# Patient Record
Sex: Male | Born: 1965 | Race: Black or African American | Hispanic: No | Marital: Single | Smoking: Never smoker
Health system: Southern US, Community
[De-identification: ages and names within clinical notes are randomized; demographics above are authoritative.]

## PROBLEM LIST (undated history)

## (undated) DIAGNOSIS — I472 Ventricular tachycardia: Secondary | ICD-10-CM

## (undated) DIAGNOSIS — E785 Hyperlipidemia, unspecified: Secondary | ICD-10-CM

## (undated) DIAGNOSIS — Z95810 Presence of automatic (implantable) cardiac defibrillator: Secondary | ICD-10-CM

## (undated) DIAGNOSIS — I5022 Chronic systolic (congestive) heart failure: Secondary | ICD-10-CM

## (undated) DIAGNOSIS — I10 Essential (primary) hypertension: Secondary | ICD-10-CM

## (undated) DIAGNOSIS — I420 Dilated cardiomyopathy: Secondary | ICD-10-CM

## (undated) DIAGNOSIS — M1990 Unspecified osteoarthritis, unspecified site: Secondary | ICD-10-CM

## (undated) DIAGNOSIS — I429 Cardiomyopathy, unspecified: Secondary | ICD-10-CM

## (undated) DIAGNOSIS — Z950 Presence of cardiac pacemaker: Secondary | ICD-10-CM

## (undated) HISTORY — DX: Morbid (severe) obesity due to excess calories: E66.01

## (undated) HISTORY — DX: Dilated cardiomyopathy: I42.0

## (undated) HISTORY — DX: Cardiomyopathy, unspecified: I42.9

## (undated) HISTORY — DX: Hyperlipidemia, unspecified: E78.5

## (undated) HISTORY — DX: Essential (primary) hypertension: I10

## (undated) HISTORY — DX: Presence of automatic (implantable) cardiac defibrillator: Z95.810

## (undated) HISTORY — DX: Ventricular tachycardia: I47.2

---

## 2000-04-12 ENCOUNTER — Encounter: Admission: RE | Admit: 2000-04-12 | Discharge: 2000-04-12 | Payer: Self-pay | Admitting: Family Medicine

## 2000-04-12 ENCOUNTER — Encounter: Payer: Self-pay | Admitting: Family Medicine

## 2004-07-25 ENCOUNTER — Emergency Department (HOSPITAL_COMMUNITY): Admission: EM | Admit: 2004-07-25 | Discharge: 2004-07-25 | Payer: Self-pay | Admitting: Family Medicine

## 2004-09-18 ENCOUNTER — Inpatient Hospital Stay (HOSPITAL_COMMUNITY): Admission: AD | Admit: 2004-09-18 | Discharge: 2004-09-22 | Payer: Self-pay | Admitting: Interventional Cardiology

## 2006-07-13 ENCOUNTER — Emergency Department (HOSPITAL_COMMUNITY): Admission: EM | Admit: 2006-07-13 | Discharge: 2006-07-13 | Payer: Self-pay | Admitting: Family Medicine

## 2006-08-21 ENCOUNTER — Emergency Department (HOSPITAL_COMMUNITY): Admission: EM | Admit: 2006-08-21 | Discharge: 2006-08-21 | Payer: Self-pay | Admitting: Emergency Medicine

## 2007-05-26 ENCOUNTER — Emergency Department (HOSPITAL_COMMUNITY): Admission: EM | Admit: 2007-05-26 | Discharge: 2007-05-26 | Payer: Self-pay | Admitting: Emergency Medicine

## 2007-05-30 ENCOUNTER — Encounter (INDEPENDENT_AMBULATORY_CARE_PROVIDER_SITE_OTHER): Payer: Self-pay | Admitting: Internal Medicine

## 2007-09-27 ENCOUNTER — Emergency Department (HOSPITAL_COMMUNITY): Admission: EM | Admit: 2007-09-27 | Discharge: 2007-09-27 | Payer: Self-pay | Admitting: Emergency Medicine

## 2007-12-11 ENCOUNTER — Emergency Department (HOSPITAL_COMMUNITY): Admission: EM | Admit: 2007-12-11 | Discharge: 2007-12-11 | Payer: Self-pay | Admitting: Family Medicine

## 2008-05-29 ENCOUNTER — Ambulatory Visit: Payer: Self-pay | Admitting: Internal Medicine

## 2008-05-29 ENCOUNTER — Encounter (INDEPENDENT_AMBULATORY_CARE_PROVIDER_SITE_OTHER): Payer: Self-pay | Admitting: Internal Medicine

## 2008-05-29 DIAGNOSIS — I428 Other cardiomyopathies: Secondary | ICD-10-CM | POA: Insufficient documentation

## 2008-05-29 DIAGNOSIS — I10 Essential (primary) hypertension: Secondary | ICD-10-CM | POA: Insufficient documentation

## 2008-05-31 LAB — CONVERTED CEMR LAB
Albumin: 4.4 g/dL (ref 3.5–5.2)
BUN: 15 mg/dL (ref 6–23)
CO2: 24 meq/L (ref 19–32)
Chloride: 101 meq/L (ref 96–112)
Cholesterol: 173 mg/dL (ref 0–200)
Glucose, Bld: 98 mg/dL (ref 70–99)
HCT: 44.8 % (ref 39.0–52.0)
Potassium: 4 meq/L (ref 3.5–5.3)
RBC: 5.14 M/uL (ref 4.22–5.81)
RDW: 15.4 % (ref 11.5–15.5)
Sodium: 139 meq/L (ref 135–145)
TSH: 1.593 microintl units/mL (ref 0.350–4.50)
Total Bilirubin: 0.7 mg/dL (ref 0.3–1.2)
Total CHOL/HDL Ratio: 4.4

## 2008-06-10 ENCOUNTER — Telehealth (INDEPENDENT_AMBULATORY_CARE_PROVIDER_SITE_OTHER): Payer: Self-pay | Admitting: *Deleted

## 2008-06-11 ENCOUNTER — Telehealth: Payer: Self-pay | Admitting: Infectious Diseases

## 2009-07-08 ENCOUNTER — Telehealth (INDEPENDENT_AMBULATORY_CARE_PROVIDER_SITE_OTHER): Payer: Self-pay | Admitting: Internal Medicine

## 2009-07-10 ENCOUNTER — Ambulatory Visit: Payer: Self-pay | Admitting: Internal Medicine

## 2009-07-11 DIAGNOSIS — E785 Hyperlipidemia, unspecified: Secondary | ICD-10-CM

## 2009-07-11 LAB — CONVERTED CEMR LAB
ALT: 20 units/L (ref 0–53)
AST: 17 units/L (ref 0–37)
CO2: 29 meq/L (ref 19–32)
Calcium: 9 mg/dL (ref 8.4–10.5)
Chloride: 103 meq/L (ref 96–112)
Cholesterol: 163 mg/dL (ref 0–200)
Creatinine, Ser: 1.19 mg/dL (ref 0.40–1.50)
HDL: 34 mg/dL — ABNORMAL LOW (ref >39)
Hemoglobin: 14.1 g/dL (ref 13.0–17.0)
LDL Cholesterol: 115 mg/dL — ABNORMAL HIGH (ref 0–99)
MCV: 90.4 fL (ref >=78.0)
Platelets: 379 10*3/uL (ref 150–400)
Potassium: 4.4 meq/L (ref 3.5–5.3)
Pro B Natriuretic peptide (BNP): 83 pg/mL (ref 0.0–100.0)
TSH: 1.181 microintl units/mL (ref 0.350–4.5)
Triglycerides: 69 mg/dL (ref <150)

## 2009-07-29 ENCOUNTER — Ambulatory Visit (HOSPITAL_COMMUNITY): Admission: RE | Admit: 2009-07-29 | Discharge: 2009-07-29 | Payer: Self-pay | Admitting: Internal Medicine

## 2009-07-29 ENCOUNTER — Encounter (INDEPENDENT_AMBULATORY_CARE_PROVIDER_SITE_OTHER): Payer: Self-pay | Admitting: Internal Medicine

## 2010-06-23 ENCOUNTER — Telehealth: Payer: Self-pay | Admitting: *Deleted

## 2010-07-14 NOTE — Assessment & Plan Note (Signed)
Summary: EST-CK/FU/MEDS/CFB   Vital Signs:  Patient profile:   45 year old male Height:      71.5 inches (181.61 cm) Weight:      382.5 pounds (173.86 kg) BMI:     52.79 Temp:     98.2 degrees F (36.78 degrees C) oral Pulse rate:   116 / minute BP sitting:   116 / 84  (left arm)  Vitals Entered By: William Kidney Ditzler RN (July 10, 2009 10:56 AM) Is Patient Diabetic? No Pain Assessment Patient in pain? no      Nutritional Status BMI of > 30 = obese Nutritional Status Detail appetite good  Have you ever been in a relationship where you felt threatened, hurt or afraid?denies   Does patient need assistance? Functional Status Self care Ambulation Normal Comments FU - doing well.   History of Present Illness: This is a 45 year old man with past medical history of CHF diagnosed in 2001. He is here for follow up appointment so that he can continue to recieve medications though our office.  He is not been here in about a year.  At last visit we were trying to get records from Dr. Katrinka Blazing, his former cardiologist, to see where he was in his care for CM.  We have not gotten those records.  There is no ECHO in echart.  He has no complaints, no shortness of breath, orthopnea, chest pain, ankle edema or decrease in exercise tolerance.      Depression History:      The patient denies a depressed mood most of the day and a diminished interest in his usual daily activities.         Preventive Screening-Counseling & Management  Alcohol-Tobacco     Alcohol drinks/day: 0     Smoking Status: never  Caffeine-Diet-Exercise     Does Patient Exercise: no  Current Medications (verified): 1)  Furosemide 80 Mg Tabs (Furosemide) .... Take One and One Half Pills Two Times A Day. 2)  Lisinopril 20 Mg Tabs (Lisinopril) .... Take One Tablet Daily.  Allergies: 1)  Bidil (Isosorb Dinitrate-Hydralazine) 2)  Beta Blockers  Review of Systems       per hpi  Physical Exam  General:  alert and  overweight-appearing.   Head:  normocephalic and atraumatic.   Lungs:  normal respiratory effort and normal breath sounds.   Heart:  normal rate, regular rhythm, no murmur, and S3 gallop.   Pulses:  +1 Extremities:  no edema Neurologic:  alert & oriented X3, cranial nerves II-XII intact, strength normal in all extremities, and gait normal.   Psych:  Oriented X3, memory intact for recent and remote, normally interactive, good eye contact, and not anxious appearing.     Impression & Recommendations:  Problem # 1:  CARDIOMYOPATHY, DILATED (ICD-425.4) Not on optimal regimen at all.  Have ordered an ECHO to evaluate current function, especially since he is feeling so well. Will check basic labs. Will decide on referral based on ECHO and labs.  May have some trouble if he really is intolerant of BB and nitrates.  Orders: T-BNP  (B Natriuretic Peptide) ([id number]) T-CBC No Diff ([id number]) T-TSH ([id number]) 2 D Echo (2 D Echo)  Problem # 2:  MORBID OBESITY (ICD-278.01) discussed diet and weight loss.  Orders: T-Comprehensive Metabolic Panel [phone]) T-BNP  (B Natriuretic Peptide) ([id number]) T-Hgb A1C (in-house) ([id number]) T-TSH ([id number])  Problem # 3:  HYPERTENSION NEC (ICD-997.91) BP is great today.  Orders: T-Comprehensive Metabolic Panel [phone])  Complete Medication List: 1)  Furosemide 80 Mg Tabs (Furosemide) .... Take one and one half pills two times a day. 2)  Lisinopril 20 Mg Tabs (Lisinopril) .... Take one tablet daily.  Other Orders: T-Lipid Profile ([id number])  Patient Instructions: 1)  Please schedule a follow-up appointment in 1 month. 2)  You had labwork done today we will call you if there is anything that needs to be addressed before your next appointment. 3)  You need to lose weight. Consider a lower calorie diet and regular exercise.  Try to walk for 30 mintues a day. Process Orders Check Orders Results:     [facility]: ABN not required for this insurance Tests Sent for requisitioning (July 10, 2009 1:40 PM):     07/10/2009: [facility] -- T-Lipid Profile [phone] (signed)     07/10/2009: [facility] -- T-Comprehensive Metabolic Panel [[id number]] (signed)     07/10/2009: [facility] -- T-BNP  (B Natriuretic Peptide) [phone] (signed)     07/10/2009: [facility] -- T-CBC No Diff [[id number]] (signed)     07/10/2009: [facility] -- T-TSH [phone] (signed)    Prevention & Chronic Care Immunizations   Influenza vaccine: Not documented    Tetanus booster: Not documented    Pneumococcal vaccine: Not documented  Other Screening   Smoking status: never  (07/10/2009)  Lipids   Total Cholesterol: 173  (05/29/2008)   Lipid panel action/deferral: Lipid Panel ordered   LDL: 118  (05/29/2008)   LDL Direct: Not documented   HDL: 39  (05/29/2008)   Triglycerides: 82  (05/29/2008)  Laboratory Results   Blood Tests   Date/Time Received: July 10, 2009 12:18 PM Date/Time Reported: Alric Quan  July 10, 2009 12:18 PM   HGBA1C: 6.1%   (Normal Range: Non-Diabetic - 3-6%   Control Diabetic - 6-8%)

## 2010-07-14 NOTE — Progress Notes (Signed)
Summary: refill/gg  Phone Note Refill Request  on July 08, 2009 11:24 AM  Refills Requested: Medication #1:  FUROSEMIDE 80 MG TABS Take one and one half pills two times a day. Pt # [id number]   Method Requested: Electronic Initial call taken by: Merrie Roof RN,  July 08, 2009 11:24 AM  Follow-up for Phone Call        refilled for one month.  He needs to make an appointment to be seen for more refills. Please let him know.    Prescriptions: FUROSEMIDE 80 MG TABS (FUROSEMIDE) Take one and one half pills two times a day.  #64 x 0   Entered and Authorized by:   Elby Showers MD   Signed by:   Elby Showers MD on 07/08/2009   Method used:   Electronically to        [facility] (retail)       [address]       [place], [place]  [address]       Ph: [phone]       Fax: [phone]   RxID:   [id number]   Appended Document: refill/gg Pt informed

## 2010-07-16 NOTE — Progress Notes (Signed)
Summary: refill/gg  Phone Note Refill Request  on June 23, 2010 12:36 PM  Refills Requested: Medication #1:  FUROSEMIDE 80 MG TABS Take one and one half pills two times a day.  Method Requested: Electronic Initial call taken by: Merrie Roof RN,  June 23, 2010 12:36 PM  Follow-up for Phone Call        Refill approved-nurse to complete  One month only.  Long overdue for visit and labs. Follow-up by: Ulyess Mort MD,  June 23, 2010 1:51 PM  Additional Follow-up for Phone Call Additional follow up Details #1::        Rx faxed to pharmacy Pt informed that he needs appointment  within 1 month Additional Follow-up by: Merrie Roof RN,  June 23, 2010 5:14 PM    Prescriptions: FUROSEMIDE 80 MG TABS (FUROSEMIDE) Take one and one half pills two times a day.  #64 x 1   Entered and Authorized by:   Ulyess Mort MD   Signed by:   Ulyess Mort MD on 06/23/2010   Method used:   Electronically to        [facility] (retail)       [address]       [place], [place]  [address]       Ph: [phone]       Fax: [phone]   RxID:   [id number]

## 2010-07-22 ENCOUNTER — Other Ambulatory Visit: Payer: Self-pay | Admitting: *Deleted

## 2010-07-23 MED ORDER — LISINOPRIL 20 MG PO TABS: 20.0000 mg | ORAL_TABLET | Freq: Every day | ORAL | Status: DC

## 2010-07-31 ENCOUNTER — Inpatient Hospital Stay (INDEPENDENT_AMBULATORY_CARE_PROVIDER_SITE_OTHER)
Admission: RE | Admit: 2010-07-31 | Discharge: 2010-07-31 | Disposition: A | Discharge status: Home / Self Care | Payer: Self-pay | Source: Ambulatory Visit | Attending: Family Medicine | Admitting: Family Medicine

## 2010-07-31 ENCOUNTER — Ambulatory Visit (INDEPENDENT_AMBULATORY_CARE_PROVIDER_SITE_OTHER): Payer: Self-pay

## 2010-07-31 ENCOUNTER — Emergency Department (HOSPITAL_COMMUNITY)
Admission: EM | Admit: 2010-07-31 | Discharge: 2010-07-31 | Disposition: A | Discharge status: Home / Self Care | Payer: Self-pay | Attending: Emergency Medicine | Admitting: Emergency Medicine

## 2010-07-31 DIAGNOSIS — M7989 Other specified soft tissue disorders: Secondary | ICD-10-CM

## 2010-07-31 DIAGNOSIS — I10 Essential (primary) hypertension: Secondary | ICD-10-CM | POA: Insufficient documentation

## 2010-07-31 DIAGNOSIS — M79609 Pain in unspecified limb: Secondary | ICD-10-CM | POA: Insufficient documentation

## 2010-07-31 DIAGNOSIS — M25569 Pain in unspecified knee: Secondary | ICD-10-CM | POA: Insufficient documentation

## 2010-07-31 DIAGNOSIS — Z79899 Other long term (current) drug therapy: Secondary | ICD-10-CM | POA: Insufficient documentation

## 2010-07-31 DIAGNOSIS — M25469 Effusion, unspecified knee: Secondary | ICD-10-CM | POA: Insufficient documentation

## 2010-07-31 DIAGNOSIS — I509 Heart failure, unspecified: Secondary | ICD-10-CM | POA: Insufficient documentation

## 2010-07-31 LAB — POCT I-STAT, CHEM 8
BUN: 13 mg/dL (ref 6–23)
Calcium, Ion: 1.07 mmol/L — ABNORMAL LOW (ref 1.12–1.32)
Chloride: 100 mEq/L (ref 96–112)
Creatinine, Ser: 1.3 mg/dL (ref 0.4–1.5)
Glucose, Bld: 99 mg/dL (ref 70–99)
Hemoglobin: 15 g/dL (ref 13.0–17.0)
Sodium: 142 mEq/L (ref 135–145)

## 2010-08-01 ENCOUNTER — Ambulatory Visit (HOSPITAL_COMMUNITY)
Admission: AD | Admit: 2010-08-01 | Discharge: 2010-08-01 | Disposition: A | Discharge status: Home / Self Care | Payer: Self-pay | Source: Ambulatory Visit | Attending: Pediatric Cardiology | Admitting: Pediatric Cardiology

## 2010-08-01 DIAGNOSIS — M7989 Other specified soft tissue disorders: Secondary | ICD-10-CM | POA: Insufficient documentation

## 2010-08-01 DIAGNOSIS — M79609 Pain in unspecified limb: Secondary | ICD-10-CM

## 2010-08-14 ENCOUNTER — Encounter: Payer: Self-pay | Admitting: Internal Medicine

## 2010-08-14 ENCOUNTER — Ambulatory Visit (INDEPENDENT_AMBULATORY_CARE_PROVIDER_SITE_OTHER): Payer: Self-pay | Admitting: Internal Medicine

## 2010-08-14 DIAGNOSIS — M109 Gout, unspecified: Secondary | ICD-10-CM

## 2010-08-14 DIAGNOSIS — E785 Hyperlipidemia, unspecified: Secondary | ICD-10-CM

## 2010-08-14 DIAGNOSIS — E790 Hyperuricemia without signs of inflammatory arthritis and tophaceous disease: Secondary | ICD-10-CM | POA: Insufficient documentation

## 2010-08-14 DIAGNOSIS — I428 Other cardiomyopathies: Secondary | ICD-10-CM

## 2010-08-14 DIAGNOSIS — IMO0002 Reserved for concepts with insufficient information to code with codable children: Secondary | ICD-10-CM

## 2010-08-14 LAB — BASIC METABOLIC PANEL
BUN: 12 mg/dL (ref 6–23)
CO2: 29 mEq/L (ref 19–32)
Calcium: 9.6 mg/dL (ref 8.4–10.5)
Chloride: 102 mEq/L (ref 96–112)
Glucose, Bld: 92 mg/dL (ref 70–99)
Sodium: 142 mEq/L (ref 135–145)

## 2010-08-14 MED ORDER — COLCHICINE 0.6 MG PO TABS: ORAL_TABLET | ORAL | Status: DC

## 2010-08-14 MED ORDER — PRAVASTATIN SODIUM 40 MG PO TABS
40.0000 mg | ORAL_TABLET | Freq: Every day | ORAL | Status: DC
Start: 2010-08-14 — End: 2011-04-30

## 2010-08-14 NOTE — Assessment & Plan Note (Signed)
At goal. Continue current regimen. Check bmet.

## 2010-08-14 NOTE — Assessment & Plan Note (Addendum)
No evidence to suggest septic arthritis. Will start patient on colchicine and check serum uric acid.

## 2010-08-14 NOTE — Progress Notes (Signed)
  Subjective:    Patient ID: William Davila, male    DOB: Dec 12, 1965, 45 y.o.   MRN: [id number]  HPI 45 yr old man with  Past Medical History  Diagnosis Date  . CHF (congestive heart failure)   . Dilated cardiomyopathy 05/2008    2D Echo 2/11: estimated ejection fraction was 20%. Diffuse hypokinesis.  . Hyperlipidemia   . Hypertension   . Morbid obesity     comes to the clinic because left foot pain for about one month duration. Patient reports that symptoms started the day after superbowl. He first noticed that his left knee was swollen. Three days later started to get foot pain. Pain is localized to first toe and anterior aspect of foot. Reports not to have pain on left knee but feels like it has fluid in it, can't bend it.  Aggravated with walking. Relieved slightly motrin. Denies fever/chills. In the past patient reports that he has had monoarticular pain on left and right first toes but pain has never lasted this long before.   Patient hasn't seen Dr. Katrinka Blazing in about 1-2 years because he doesn't have insurance. Does not have a job.    Review of Systems  [all other systems reviewed and are negative       Objective:   Physical Exam  Constitutional: He is oriented to person, place, and time.       obese  HENT:  Mouth/Throat: Oropharynx is clear and moist.  Eyes: Conjunctivae and EOM are normal. Pupils are equal, round, and reactive to light.  Neck: Normal range of motion. Neck supple.  Cardiovascular: Normal rate and regular rhythm.   Murmur heard. Pulmonary/Chest: Effort normal and breath sounds normal.  Abdominal: Soft. Bowel sounds are normal.  Musculoskeletal:       ttp first left toe, no erythema and not warm to touch Left knee minor swelling, ttp medially, no erythema and not warm to touch  Neurological: He is alert and oriented to person, place, and time.  Psychiatric: He has a normal mood and affect.          Assessment & Plan:

## 2010-08-14 NOTE — Assessment & Plan Note (Signed)
Patient was not taking statin. Instructed to restart taking pravastatin as directed. Check FLP and cmet on follow up.

## 2010-08-14 NOTE — Patient Instructions (Signed)
Please make follow up appointment in 2 weeks. Start taking Colchine as directed. Get orange card to go to Cardiologist.

## 2010-08-14 NOTE — Assessment & Plan Note (Signed)
Stable. Instructed patient he need to follow up with Cardiologist to discuss possible AICD with EF<30%. Patient refused referral today. Instructed to get orange card so referral can be make. Will follow up.

## 2010-09-04 ENCOUNTER — Encounter: Payer: Self-pay | Admitting: Internal Medicine

## 2010-09-04 ENCOUNTER — Ambulatory Visit (INDEPENDENT_AMBULATORY_CARE_PROVIDER_SITE_OTHER): Payer: Self-pay | Admitting: Internal Medicine

## 2010-09-04 VITALS — BP 115/82 | HR 63 | Temp 97.3°F | Wt 351.9 lb

## 2010-09-04 DIAGNOSIS — IMO0002 Reserved for concepts with insufficient information to code with codable children: Secondary | ICD-10-CM

## 2010-09-04 DIAGNOSIS — I428 Other cardiomyopathies: Secondary | ICD-10-CM

## 2010-09-04 DIAGNOSIS — M109 Gout, unspecified: Secondary | ICD-10-CM

## 2010-09-04 MED ORDER — PREDNISONE (PAK) 10 MG PO TABS: ORAL_TABLET | ORAL | Status: DC

## 2010-09-04 NOTE — Assessment & Plan Note (Signed)
At goal. Continue current regimen. 

## 2010-09-04 NOTE — Assessment & Plan Note (Signed)
Patient could not afford colchicine. Will prescribe a prednisone taper. Follow up in one month. Instructed to make appointment sooner if needed.

## 2010-09-04 NOTE — Progress Notes (Signed)
  Subjective:    Patient ID: William Davila, male    DOB: February 24, 1966, 45 y.o.   MRN: [id number]  HPI  45 yr old man with  Past Medical History  Diagnosis Date  . CHF (congestive heart failure)   . Dilated cardiomyopathy 05/2008    2D Echo 2/11: estimated ejection fraction was 20%. Diffuse hypokinesis.  . Hyperlipidemia   . Hypertension   . Morbid obesity    comes to the clinic for follow up of left foot pain.  Patient did not take colchicine because of cost of medication. Reports that mother has gout and took some of her ?percocet that decreased inflammation. Reports that  toe pain is improved but ankle pain continues to be experienced. Aggravated by putting weight on ankle. Denies trauma, fever/chills, erythema,    Review of Systems  All other systems reviewed and are negative.       Objective:   Physical Exam  Constitutional: He is oriented to person, place, and time.       obese  HENT:  Mouth/Throat: Oropharynx is clear and moist.  Eyes: Conjunctivae and EOM are normal. Pupils are equal, round, and reactive to light.  Neck: Normal range of motion. Neck supple.  Cardiovascular: Normal rate and regular rhythm.   Murmur heard. Pulmonary/Chest: Effort normal and breath sounds normal.  Abdominal: Soft. Bowel sounds are normal.  Musculoskeletal:       ttp first left toe and ankle, no erythema and not warm to touch Left knee no swelling, ttp medially, no erythema and not warm to touch  Neurological: He is alert and oriented to person, place, and time.  Psychiatric: He has a normal mood and affect.          Assessment & Plan:

## 2010-09-04 NOTE — Patient Instructions (Signed)
Make a follow up appointment in 1 month. Take all medication as directed. 

## 2010-09-04 NOTE — Assessment & Plan Note (Signed)
Stable. No evidence of volume overload. Continue current regimen.

## 2010-09-22 ENCOUNTER — Other Ambulatory Visit: Payer: Self-pay | Admitting: *Deleted

## 2010-09-22 DIAGNOSIS — I420 Dilated cardiomyopathy: Secondary | ICD-10-CM

## 2010-09-24 MED ORDER — FUROSEMIDE 80 MG PO TABS: 120.0000 mg | ORAL_TABLET | Freq: Two times a day (BID) | ORAL | Status: DC

## 2010-10-05 ENCOUNTER — Ambulatory Visit: Payer: Self-pay | Admitting: Internal Medicine

## 2010-10-12 ENCOUNTER — Ambulatory Visit (HOSPITAL_COMMUNITY)
Admission: RE | Admit: 2010-10-12 | Discharge: 2010-10-12 | Disposition: A | Discharge status: Home / Self Care | Payer: Self-pay | Source: Ambulatory Visit | Attending: Internal Medicine | Admitting: Internal Medicine

## 2010-10-12 ENCOUNTER — Encounter: Payer: Self-pay | Admitting: Internal Medicine

## 2010-10-12 ENCOUNTER — Ambulatory Visit (INDEPENDENT_AMBULATORY_CARE_PROVIDER_SITE_OTHER): Payer: Self-pay | Admitting: Internal Medicine

## 2010-10-12 DIAGNOSIS — IMO0002 Reserved for concepts with insufficient information to code with codable children: Secondary | ICD-10-CM

## 2010-10-12 DIAGNOSIS — M25569 Pain in unspecified knee: Secondary | ICD-10-CM | POA: Insufficient documentation

## 2010-10-12 DIAGNOSIS — R609 Edema, unspecified: Secondary | ICD-10-CM | POA: Insufficient documentation

## 2010-10-12 DIAGNOSIS — M109 Gout, unspecified: Secondary | ICD-10-CM | POA: Insufficient documentation

## 2010-10-12 DIAGNOSIS — M2240 Chondromalacia patellae, unspecified knee: Secondary | ICD-10-CM | POA: Insufficient documentation

## 2010-10-12 LAB — URIC ACID: Uric Acid, Serum: 11.6 mg/dL — ABNORMAL HIGH (ref 4.0–7.8)

## 2010-10-12 MED ORDER — DICLOFENAC SODIUM 75 MG PO TBEC: 75.0000 mg | DELAYED_RELEASE_TABLET | Freq: Two times a day (BID) | ORAL | Status: DC

## 2010-10-12 NOTE — Assessment & Plan Note (Signed)
Assessment the patient appears to have improved on prednisone taper, note he is unable to afford colchicine we'll give the patient a prescription for diclofenac, and recheck in the x-ray, uric acid level, and urine GC and Chlamydia to rule out atypical causes of knee pain/swelling. The swelling persists patient may agree to undergo knee arthrocentesis for further diagnostic and management.

## 2010-10-12 NOTE — Progress Notes (Signed)
  Subjective:    Patient ID: William Davila, male    DOB: 1966-03-02, 45 y.o.   MRN: [id number]  HPI  Patient is a 45 year old male with a past medical history listed below, presents to the outpatient clinic for one-month followup after he was seen here for left knee pain which is presumed to be secondary to gout flare patient was treated with a prednisone taper as he was not able to afford colchicine.  Today he returns with improved swelling and pain, however he still complains that he needs crutches to walk around, and residual left knee pain. Patient otherwise feeling well denies any other complaints  Review of Systems  [all other systems reviewed and are negative       Objective:   Physical Exam  [nursing notereviewed. Constitutional: He is oriented to person, place, and time. He appears well-developed and well-nourished.  HENT:  Head: Normocephalic and atraumatic.  Eyes: Pupils are equal, round, and reactive to light.  Neck: Normal range of motion. No JVD present. No thyromegaly present.  Cardiovascular: Normal rate, regular rhythm and normal heart sounds.   Pulmonary/Chest: Effort normal and breath sounds normal. He has no wheezes. He has no rales.  Abdominal: Soft. Bowel sounds are normal. There is no tenderness. There is no rebound.  Musculoskeletal: Normal range of motion. He exhibits no edema.       Left knee: He exhibits swelling. He exhibits no erythema. tenderness found. No lateral joint line, no MCL and no patellar tendon tenderness noted.  Neurological: He is alert and oriented to person, place, and time.  Skin: Skin is warm and dry.          Assessment & Plan:

## 2010-10-12 NOTE — Assessment & Plan Note (Signed)
Well controlled on current treatment, No new changes made today, Will continue to monitor.   

## 2010-10-30 NOTE — Discharge Summary (Signed)
NAMEMENACHEM, URBANEK                ACCOUNT NO.:  [id number]   MEDICAL RECORD NO.:  [id number]          PATIENT TYPE:  INP   LOCATION:  4734                         FACILITY:  [facility]   PHYSICIAN:  Lyn Records, M.D.   DATE OF BIRTH:  13-May-1966   DATE OF ADMISSION:  09/18/2004  DATE OF DISCHARGE:  09/22/2004                                 DISCHARGE SUMMARY   CHIEF COMPLAINT AND REASON FOR ADMISSION:  Mr. Alipio is a 45 year old  morbidly obese gentleman.  History of Class III to IV CHF, currently in a  refractory state. Also has poorly controlled hypertension.  Was diagnosed  with dilated congestive cardiomyopathy in 2001.  Ejection fraction at that  time was 20%.  He has been treated as an outpatient with Lasix, Norvasc and  Toprol and has also had multiple exacerbations of CHF treated as an  outpatient.  The patient was refusing hospitalization. He was significantly  decompensated on September 07, 2004 at that visit and refused hospitalization.  He saw Dr. Katrinka Blazing back on September 14, 2004, again refused admission.  Dr. Katrinka Blazing  has tried increasing diuretic therapy of Lasix from 40 per day to 40 mg  b.i.d., then to 80 mg b.i.d. with no significant improvement in urine output  or resolution of symptoms such as lower extremity edema and dyspnea.  On  followup on the day of admission, his weight was still at 350 pounds.  Because of the patient's unresponsiveness to outpatient diuretics and heart  failure therapy, the patient has finally agreed to come into the hospital  on September 18, 2004 for treatment for heart failure.   HOSPITAL COURSE:  The patient was admitted on September 18, 2004 by Dr. Katrinka Blazing.  He was started on aggressive IV Lasix diuresis.  Electrolyte panel was  followed and telemetry was initiated to make sure the patient had no  arrhythmic issues.  The patient diuresed throughout the next several days.  By three days later, his feet were still swollen.  He was still short of  breath.  He  still had an S3 gallop.  BUN and creatinine were stable at 13  and 1.3.  Potassium was stable at 4.7.  His weight had decreased from 342  pounds on admission to 331 pounds that day.  He was seemed appropriate to  change from IV to p.o. Lasix on September 21, 2004.  Low dose beta blocker  therapy was recommended.  On September 22, 2004 the patient was down to 330.6  pounds.  This was a total of 11 pounds off since admission.  His creatinine  was stable at 1.3, potassium 4.3.  He was not complaining of any shortness  of breath or dizziness when standing.  His blood pressure was 115/89 with a  heart rate of 111.  He was clinically compensated from the heart failure  standpoint.  Therefore, we continued his Aldactone and Lasix with a new dose  of 120 mg b.i.d.  We started low dose Coreg to help with decreased EF as  well as beta blockade since the patient was having resting  tachycardia and  he was deemed appropriate for discharge once a 2-D echocardiogram was done.  Dr. Katrinka Blazing plans to start BiDil as an outpatient.  In addition, the weight  that had initially been listed for September 22, 2004 was actually incorrectly  put into the computer for September 21, 2004 with a repeat weight for September 22, 2004 was 327.3 pounds.  So this makes the patient down about 14 pounds since  admission.  I have reviewed the chart and medical records, and I am unable  to locate the echocardiogram that was recommended by Dr. Katrinka Blazing to be done.   DISCHARGE DIAGNOSES:  1.  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  2.  Hypertension, currently well controlled.  3.  Hypokalemia.  4.  Prior history of alcohol abuse.   DISCHARGE MEDICATIONS:  1.  Coreg 3.125 mg b.i.d.  This is new.  2.  Norvasc 10 mg daily.  3.  Aldactone 25 mg b.i.d. This is new.  4.  Lasix 120 mg b.i.d.  The patient has been instructed to take 1-1/2 of      his 80 mg tablets twice a  day.  This is a new dose.  5.  Klor-Con 20 mEq per packet.  He is to take one packet twice daily in      water or juice.  The patient was complaining of difficulty swallowing      the tablets.  6.  Plans to start BiDil as an outpatient.   DIET AND ACTIVITY:  Per CHF discharge instructions provided by [facility].  This includes recommendations about salt-limited diet, recommendations to  weigh daily and to call the doctor for any weight gain.   FOLLOW UP:  He is to have a BMET at [facility] on September 29, 2004.  He  is to have a blood pressure check on the same date at 11 a.m.  He is to  follow up with Dr. Katrinka Blazing on Oct 12, 2004 at 3:30 p.m.       ALE/MEDQ  D:  11/18/2004  T:  11/18/2004  Job:  [id number]   cc:   L. Lupe Carney, M.D.  [address] [place]  [place] [address]  Fax: [phone]

## 2010-11-12 ENCOUNTER — Encounter: Payer: Self-pay | Admitting: Internal Medicine

## 2010-12-10 ENCOUNTER — Encounter: Payer: Self-pay | Admitting: Internal Medicine

## 2010-12-15 ENCOUNTER — Encounter: Payer: Self-pay | Admitting: Internal Medicine

## 2010-12-15 ENCOUNTER — Ambulatory Visit (INDEPENDENT_AMBULATORY_CARE_PROVIDER_SITE_OTHER): Payer: Self-pay | Admitting: Internal Medicine

## 2010-12-15 VITALS — BP 120/90 | HR 121 | Temp 97.6°F | Ht 70.0 in | Wt 342.2 lb

## 2010-12-15 DIAGNOSIS — M25569 Pain in unspecified knee: Secondary | ICD-10-CM

## 2010-12-15 DIAGNOSIS — M25562 Pain in left knee: Secondary | ICD-10-CM | POA: Insufficient documentation

## 2010-12-15 NOTE — Progress Notes (Signed)
  Subjective:    Patient ID: William Davila, male    DOB: July 02, 1965, 45 y.o.   MRN: [id number]  HPI 45 yo M here for f/u for L knee pain.  Was seen last in clinic in April with L knee pain, dx as possible gout flare.  Knee was swollen and painful, so prednisone taper was rx'ed.  Pt reports that swelling did decrease but that pain has persisted.  He has been using crutches for mobility.  He denies any further effusion.  He denies trauma to the area.  Prior PMD appt's thought it could be a gout flare, so a uric acid level, Xray, and gc/ct urine tests were ordered.  X ray sig for chondromalacia patella w no acute findings.  Uric acid was elevated, gc/ct negative.   Review of Systems     Objective:   Physical Exam  Musculoskeletal:       Left knee: He exhibits decreased range of motion and deformity. He exhibits no erythema, normal alignment and no LCL laxity. no tenderness found. No medial joint line, no lateral joint line, no MCL and no LCL tenderness noted.       Exam significant for firm swelling of suprapatellar area with concurrent depression more proximally.  Visually apparent and palpable.  Pt cannot fully extend knee.  During ambulation, pt has hyperextension of the knee.  Negative for ACL/PCL/MCL/LCL laxity.  No joint line tenderness.  No effusion.  Mobile patella.  Neg mcmurray.          Assessment & Plan:

## 2010-12-15 NOTE — Assessment & Plan Note (Addendum)
Given this pt's continued L knee pain and physical exam findings, there is some concern for quadriceps tendon pathology.  Could also be patellar chondromalacia as x ray indicates, but pt limited ROM is impressive enough to warrant further evaluation.  Will refer to sports medicine for u/s and possible arthrocentesis. --referral to sports medicine. --pt needs to fill out orange card paperwork, which was stressed so as not to have large bill --referral to Lupita Leash T for information regarding possible disability.  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

## 2010-12-15 NOTE — Patient Instructions (Signed)
Please complete paperwork for orange card.  Please expect call from Sports medicine to make appointment.  Please expect call from Dorothe Pea regarding disability.

## 2010-12-16 NOTE — Progress Notes (Signed)
I saw and discussed William Davila with Dr Abner Greenspan and agree with his note as outlined here.

## 2010-12-21 ENCOUNTER — Telehealth: Payer: Self-pay | Admitting: Licensed Clinical Social Worker

## 2010-12-21 NOTE — Telephone Encounter (Signed)
Soc. Work.  30 minutes.  Tel consult with patient regarding work vs. Disability.  Patient has hx of CHF, gout, left knee issues, and morbid obesity.   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   Patient lives with his grandparents. He thinks he could work but feels like no one will hire him.  His latest problem is a hyper extended left knee.  I explained to him how the Disability process works and the difficulties of going that route especially when he is only 45 years old and would like to work.  He does have CHF and so that might be taken into consideration.   I've educated him about [employer] and how they will evaluate him especially since he would like to go back to work and does have some education.   He has my phone number should that referral to [facility] not work out. He will call there and advise me if he has difficulties connecting there.  The patient is in the process of getting his paperwork together for financial eligibility with [facility].  I've also educated him about the [facility] for other needed meds.   SW as needed.

## 2011-02-02 ENCOUNTER — Encounter: Payer: Self-pay | Admitting: Family Medicine

## 2011-02-02 ENCOUNTER — Ambulatory Visit (INDEPENDENT_AMBULATORY_CARE_PROVIDER_SITE_OTHER): Payer: Self-pay | Admitting: Family Medicine

## 2011-02-02 VITALS — BP 110/76 | HR 76

## 2011-02-02 DIAGNOSIS — IMO0002 Reserved for concepts with insufficient information to code with codable children: Secondary | ICD-10-CM

## 2011-02-02 DIAGNOSIS — S76119A Strain of unspecified quadriceps muscle, fascia and tendon, initial encounter: Secondary | ICD-10-CM | POA: Insufficient documentation

## 2011-02-02 NOTE — Patient Instructions (Signed)
You have a tear in your quadriceps tendon. Very important to start physical therapy for rehab. Make an appointment in 6 weeks to follow up.  Quadriceps Tendon Tear/Disruption with Rehab   The quadriceps muscles are located on the front of the thigh and are responsible for straightening the knee and bending the hip. The quadriceps tendon connects these muscles to the  kneecap (patella) and also from the patella to a portion of the shin bone (tibial tubercle). A quadriceps tendon tear or disruption is characterized by a partial or complete tear of the quadriceps tendon between the quadriceps muscles and the patella. Quadriceps tendon tears or disruptions often cause pain above the knee and result in a decrease in function of the quadriceps muscles.    SYMPTOMS  A "pop" or tear felt or heard above the patella at the time of injury.  Pain, tenderness, inflammation, and/or bruising over the quadriceps tendon.   Pain that worsens with use of the quadriceps muscles.  Difficulty with common tasks that involve the quadriceps muscle, such as walking.  A crackling sound (crepitation) when the tendon is moved or touched.  Loss of fullness of the muscle or bulging within the area of muscle with complete rupture.   CAUSES  A strain occurs when a force is placed on the muscle or tendon that is greater than it can withstand. Common mechanisms of injury include:  Repetitive strenuous use of the quadriceps muscles. This may be due to an increase in the intensity, frequency, or duration of exercise.  Direct trauma to the quadriceps muscles or tendons.   RISK INCREASES WITH   Activities that involve forceful contractions of the quadriceps muscles (jumping or sprinting).  Contact sports (soccer or football).  Poor strength and flexibility.  Failure to warm-up properly before activity.  Previous injury to the thigh or knee.  Untreated quadriceps tendinitis.  Corticosteroid injections into the  quadriceps tendon.   PREVENTIVE MEASURES   Warm up and stretch properly before activity.  Allow for adequate recovery between workouts.  Maintain physical fitness: l Strength, flexibility, and endurance. l Cardiovascular fitness.  Wear properly fitted and padded protective equipment.   PROGNOSIS If treated properly, then recovery from a quadriceps tendon tear or disruption usually occurs; however the recovery period may b 6 to 9 months.    POSSIBLE COMPLICATIONS   Quadriceps muscle weakness.  Re-rupture of the tendon after treatment.  Prolonged healing time, if improperly treated or re-injured.  Risks of surgery: infection, bleeding, nerve damage, or damage to surrounding tissues.   GENERAL TREATMENT CONSIDERATIONS  Initial treatment involves rest from any activities that aggravate the symptoms. Ice, medication, and elevation may be used to help reduce pain and inflammation. The use of strengthening and stretching exercises may help reduce pain with activity. These exercises may be performed at home or with referral to a therapist. If the tear is complete, then surgery is usually required to repair the tendon, as it cannot heal on its own. After surgery immobilization is required to allow for healing. After immobilization it is important to perform strengthening and stretching exercises to help regain strength and a full range of motion.    MEDICATION   If pain medication is necessary, then nonsteroidal anti-inflammatory medications, such as aspirin and ibuprofen, or other minor pain relievers, such as acetaminophen, are often recommended.   Do not take pain medication for 7 days before surgery.   Prescription pain relievers may be given if deemed necessary by your caregiver. Use only  as directed and only as much as you need.   COLD THERAPY   Cold treatment (icing) relieves pain and reduces inflammation. Cold treatment should be applied for 10 to 15 minutes every 2 to 3 hours  for inflammation and pain and immediately after any activity that aggravates your symptoms. Use ice packs or massage the area with a piece of ice (ice massage).   SEEK TREATMENT IF:  Treatment seems to offer no benefit, or the condition worsens.  Any medications produce adverse side effects.  Any complications from surgery occur: l Pain, numbness, or coldness in the extremity operated upon. l Discoloration of the nail beds (they become blue or gray) of the extremity operated upon. l Signs of infections (fever, pain, inflammation, redness, or persistent bleeding).      EXERCISES   RANGE OF MOTION AND STRETCHING EXERCISES - Quadriceps Tendon Tear/Disruption These exercises may help you when beginning to rehabilitate your injury. Your symptoms may resolve with or without further involvement from your physician, physical therapist or athletic trainer. While completing these exercises, remember:     Restoring tissue flexibility helps normal motion to return to the joints.This allows healthier, less painful movement and activity.  An effective stretch should be held for at least 30 seconds.  A stretch should never be painful. You should only feel a gentle lengthening or release in the stretched tissue.      RANGE OF MOTION - Knee Flexion and Extension, Active-Assisted  Sit on the edge of a table or chair with your thighs firmly supported.  It may be helpful to place a folded towel under the end of your __________ thigh.   Flexion (bending): Place the ankle of your healthy leg on top of the other ankle. Use your healthy leg to gently bend your __________ knee until you feel a mild tension across the top of your knee.   Hold for __________ seconds.   Extension (straightening): Switch your ankles so your __________ leg is on top. Use your healthy leg to straighten your __________ knee until you feel a mild tension on the backside of your knee.  Hold for __________ seconds. Repeat  __________ times. Complete __________ times per day.     RANGE OF MOTION - Knee Flexion, Active  Lie on your back with both knees straight. (If this causes back discomfort, bend your opposite knee, placing your foot flat on the floor.)  Slowly slide your heel back toward your buttocks until you feel a gentle stretch in the front of your knee or thigh.   Hold for __________ seconds. Slowly slide your heel back to the starting position. Repeat __________ times. Complete this exercise __________ times per day.      STRETCH - Knee Flexion, Supine  Lie on the floor with your __________ heel/foot lightly touching the wall (place both feet on the wall if you do not use a door frame).  Without using any effort, allow gravity to slide your foot down the wall slowly until you feel a gentle stretch in the front of your __________ knee.    Hold this stretch for __________ seconds. Then return the leg to the starting position, using your health leg for help, if needed.  Repeat __________ times. Complete this stretch __________ times per day.      STRETCH - Quadriceps, Prone   Lie on your stomach on a firm surface, such as a bed or padded floor.  Bend your __________ knee and grasp your ankle. If you are  unable to reach, your ankle or pant leg, use a belt around your foot to lengthen your reach.  Gently pull your heel toward your buttocks. Your knee should not slide out to the side. You should feel a stretch in the front of your thigh and/or knee. Hold this position for __________ seconds.   Repeat __________ times. Complete this stretch __________ times per day.      STRENGTHENING EXERCISES - Quadriceps Tendon Tear/Disruption These exercises may help you when beginning to rehabilitate your injury. They may resolve your symptoms with or without further involvement from your physician, physical therapist or athletic trainer. While completing these exercises, remember:   Muscles can gain both  the endurance and the strength needed for everyday activities through controlled exercises.  Complete these exercises as instructed by your physician, physical therapist or athletic trainer. Progress the resistance and repetitions only as guided.     STRENGTH - Quadriceps, Isometrics  Lie on your back with your __________ leg extended and your opposite knee bent.  Gradually tense the muscles in the front of your __________ thigh. You should see either your knee cap slide up toward your hip or increased dimpling just above the knee.  This motion will push the back of the knee down toward the floor/mat/bed on which you are lying.    Hold the muscle as tight as you can without increasing your pain for __________ seconds.  Relax the muscles slowly and completely in between each repetition. Repeat __________ times. Complete this exercise __________ times per day.      STRENGTH - Quadriceps, Short Arcs   Lie on your back. Place a __________ inch towel roll under your knee so that the knee slightly bends.  Raise only your lower leg by tightening the muscles in the front of your thigh. Do not allow your thigh to rise.  Hold this position for __________ seconds. Repeat __________ times. Complete this exercise __________ times per day.      OPTIONAL ANKLE WEIGHTS:  Begin with __________ pounds, but DO NOT exceed ____________________ pounds.  Increase in 1 pound increments.       STRENGTH - Quadriceps, Straight Leg Raises    Quality counts! Watch for signs that the quadriceps muscle is working to insure you are strengthening the correct muscles and not "cheating" by substituting with healthier muscles.  Lay on your back with your __________ leg extended and your opposite knee bent.    Tense the muscles in the front of your __________ thigh. You should see either your knee cap slide up or increased dimpling just above the knee. Your thigh may even quiver.  Tighten these muscles even more  and raise your leg 4 to 6 inches off the floor. Hold for __________ seconds.  Keeping these muscles tense, lower your leg.  Relax the muscles slowly and completely in between each repetition. Repeat __________ times. Complete this exercise __________ times per day.     STRENGTH - Quadriceps, Step-Ups   Use a thick book, step or step stool that is __________ inches tall.  Holding a wall or counter for balance only, not support.  Slowly step-up with your __________ foot, keeping your knee in line with your hip and foot. Do not allow your knee to bend so far that you cannot see your toes.  Slowly unlock your knee and lower yourself to the starting position.  Your muscles, not gravity, should lower you. Repeat __________ times. Complete this exercise __________ times per day.  STRENGTH - Quadriceps, Wall Slides    Follow guidelines for form closely.  Increased knee pain often results from poorly placed feet or knees.  Lean against a smooth wall or door and walk your feet out 18-24 inches.  Place your feet hip-width apart.  Slowly slide down the wall or door until your knees bend __________ degrees.*  Keep your knees over your heels, not your toes, and in line with your hips, not falling to either side.  Hold for __________ seconds. Stand up to rest for __________ seconds in between each repetition.  Repeat __________ times. Complete this exercise __________ times per day.   * Your physician, physical therapist or athletic trainer will alter this angle based on your symptoms and progress.   Document Released: 05/31/2005  Document Re-Released: 06/22/2009 [facility] Patient Information 2011 [place], [place].

## 2011-02-02 NOTE — Progress Notes (Signed)
  Subjective:    Patient ID: William Davila, male    DOB: 10/12/1965, 45 y.o.   MRN: [id number]  HPI  Left knee problems. He noticed on the day of the [employer] Bowl day he started having difficulty straightening his lower leg all the way out. He does not recall a specific trauma. He was doing a lot of jumping around on that day at the [employer]. The next morning he woke with knee pain, a lot of the swelling and inability to straighten his leg. The swelling has improved since then. It took about a month. His pain has not improved. He's been walking on crutches most of the time since then. He denies prior injury to either knee. He has not any surgeries on his means. He denies any tingling or numbness in his lower extremities. He does feel like the left knee "bends backward" when he stands up. It has not given way on him but feels unstable.  Review of Systems    denies any recent change in his weight. Denies fever. Objective:   Physical Exam  GENERAL: Well-developed obese male no acute distress KNEES: bilaterally he has slightly laterally pointing patella but they are located well. He does have some lateralization of the tracking. The left patella is nontender to grind test. Exam is somewhat difficult secondary to habitus but he appears to be ligamentously intact to varus and valgus stress. I cannot perform Lockman on him but I did do an anterior drawer which seemed to have a good endpoint. He also had a good endpoint for the posterior cruciate ligament. The quadricep muscle bulk is less on the left than the right. Neither quadricep muscles is very well-defined. McMurray's test is negative.  ULTRASOUND: Small tear in the quadricep tendon where it intersects with the VMO. There is not any increased Doppler vascular activity noted in this area. The rest of the quadriceps and patellar tendons are intact. There is very slight suprapatellar pouch fluid collection consistent with small knee effusion. The  menisci are not well seen.      Assessment & Plan:  Small quadricep muscle/tendon tear. Noted that he has cardiomyopathy on his problem list, and he is obese I don't think a nitroglycerin patch would be appropriate for him. We'll place him in physical therapy and see him back in 4 weeks. He can continue using crutches when necessary.

## 2011-02-10 ENCOUNTER — Ambulatory Visit: Payer: Self-pay | Attending: Family Medicine | Admitting: Physical Therapy

## 2011-02-10 DIAGNOSIS — R262 Difficulty in walking, not elsewhere classified: Secondary | ICD-10-CM | POA: Insufficient documentation

## 2011-02-10 DIAGNOSIS — IMO0001 Reserved for inherently not codable concepts without codable children: Secondary | ICD-10-CM | POA: Insufficient documentation

## 2011-02-17 ENCOUNTER — Encounter: Payer: Self-pay | Admitting: Physical Therapy

## 2011-02-22 ENCOUNTER — Ambulatory Visit (INDEPENDENT_AMBULATORY_CARE_PROVIDER_SITE_OTHER): Payer: Self-pay

## 2011-02-22 ENCOUNTER — Inpatient Hospital Stay (INDEPENDENT_AMBULATORY_CARE_PROVIDER_SITE_OTHER)
Admission: RE | Admit: 2011-02-22 | Discharge: 2011-02-22 | Disposition: A | Discharge status: Home / Self Care | Payer: Self-pay | Source: Ambulatory Visit | Attending: Family Medicine | Admitting: Family Medicine

## 2011-02-22 ENCOUNTER — Encounter: Payer: Self-pay | Admitting: Physical Therapy

## 2011-02-22 DIAGNOSIS — S0990XA Unspecified injury of head, initial encounter: Secondary | ICD-10-CM

## 2011-02-22 DIAGNOSIS — M25469 Effusion, unspecified knee: Secondary | ICD-10-CM

## 2011-02-22 DIAGNOSIS — S0100XA Unspecified open wound of scalp, initial encounter: Secondary | ICD-10-CM

## 2011-02-22 DIAGNOSIS — M79609 Pain in unspecified limb: Secondary | ICD-10-CM

## 2011-02-22 LAB — POCT I-STAT, CHEM 8
Chloride: 102 mEq/L (ref 96–112)
Creatinine, Ser: 1.2 mg/dL (ref 0.50–1.35)
Hemoglobin: 17.7 g/dL — ABNORMAL HIGH (ref 13.0–17.0)
Sodium: 140 mEq/L (ref 135–145)
TCO2: 25 mmol/L (ref 0–100)

## 2011-02-24 ENCOUNTER — Encounter: Payer: Self-pay | Admitting: Physical Therapy

## 2011-03-02 ENCOUNTER — Encounter: Payer: Self-pay | Admitting: Physical Therapy

## 2011-03-04 ENCOUNTER — Encounter: Payer: Self-pay | Admitting: Physical Therapy

## 2011-03-17 ENCOUNTER — Telehealth: Payer: Self-pay | Admitting: *Deleted

## 2011-03-17 NOTE — Telephone Encounter (Signed)
Pt's mother calls to say pt is getting worse and cannot walk, having pain and wants to be seen in clinic, she states this is ongoing. Suggested urg care/ ED, declines, appt given for tues 10/9 at 1015

## 2011-03-19 ENCOUNTER — Ambulatory Visit: Payer: Self-pay | Admitting: Family Medicine

## 2011-03-22 LAB — I-STAT 8, (EC8 V) (CONVERTED LAB)
Bicarbonate: 23.4
Sodium: 137
pCO2, Ven: 39 — ABNORMAL LOW

## 2011-03-22 LAB — POCT I-STAT CREATININE
Creatinine, Ser: 1.5
Operator id: 116391

## 2011-03-22 LAB — B-NATRIURETIC PEPTIDE (CONVERTED LAB): Pro B Natriuretic peptide (BNP): 44

## 2011-03-23 ENCOUNTER — Ambulatory Visit: Payer: Self-pay | Admitting: Internal Medicine

## 2011-04-05 ENCOUNTER — Other Ambulatory Visit: Payer: Self-pay | Admitting: *Deleted

## 2011-04-05 MED ORDER — LISINOPRIL 20 MG PO TABS: 20.0000 mg | ORAL_TABLET | Freq: Every day | ORAL | Status: DC

## 2011-04-27 ENCOUNTER — Encounter: Payer: Self-pay | Admitting: Internal Medicine

## 2011-04-27 ENCOUNTER — Ambulatory Visit (HOSPITAL_COMMUNITY)
Admission: RE | Admit: 2011-04-27 | Discharge: 2011-04-27 | Disposition: A | Discharge status: Home / Self Care | Payer: Medicaid Other | Source: Ambulatory Visit | Attending: Internal Medicine | Admitting: Internal Medicine

## 2011-04-27 ENCOUNTER — Ambulatory Visit (INDEPENDENT_AMBULATORY_CARE_PROVIDER_SITE_OTHER): Payer: Self-pay | Admitting: Internal Medicine

## 2011-04-27 VITALS — BP 115/86 | HR 56 | Temp 97.0°F | Ht 70.5 in

## 2011-04-27 DIAGNOSIS — S76119A Strain of unspecified quadriceps muscle, fascia and tendon, initial encounter: Secondary | ICD-10-CM

## 2011-04-27 DIAGNOSIS — R9431 Abnormal electrocardiogram [ECG] [EKG]: Secondary | ICD-10-CM | POA: Insufficient documentation

## 2011-04-27 DIAGNOSIS — IMO0002 Reserved for concepts with insufficient information to code with codable children: Secondary | ICD-10-CM

## 2011-04-27 DIAGNOSIS — I428 Other cardiomyopathies: Secondary | ICD-10-CM

## 2011-04-27 DIAGNOSIS — I420 Dilated cardiomyopathy: Secondary | ICD-10-CM

## 2011-04-27 DIAGNOSIS — S86819A Strain of other muscle(s) and tendon(s) at lower leg level, unspecified leg, initial encounter: Secondary | ICD-10-CM

## 2011-04-27 DIAGNOSIS — M7989 Other specified soft tissue disorders: Secondary | ICD-10-CM | POA: Diagnosis not present

## 2011-04-27 LAB — BASIC METABOLIC PANEL
BUN: 12 mg/dL (ref 6–23)
CO2: 27 mEq/L (ref 19–32)
Calcium: 9.6 mg/dL (ref 8.4–10.5)
Chloride: 101 mEq/L (ref 96–112)
Creat: 0.97 mg/dL (ref 0.50–1.35)
Glucose, Bld: 93 mg/dL (ref 70–99)
Potassium: 3.8 mEq/L (ref 3.5–5.3)
Sodium: 139 mEq/L (ref 135–145)

## 2011-04-27 LAB — CBC
HCT: 44.4 % (ref 39.0–52.0)
Hemoglobin: 15.3 g/dL (ref 13.0–17.0)
MCH: 28.1 pg (ref 26.0–34.0)
MCHC: 34.5 g/dL (ref 30.0–36.0)
Platelets: 580 10*3/uL — ABNORMAL HIGH (ref 150–400)
RBC: 5.44 MIL/uL (ref 4.22–5.81)
RDW: 16.8 % — ABNORMAL HIGH (ref 11.5–15.5)
WBC: 10.7 10*3/uL — ABNORMAL HIGH (ref 4.0–10.5)

## 2011-04-27 MED ORDER — HYDROCODONE-ACETAMINOPHEN 5-500 MG PO TABS: 2.0000 | ORAL_TABLET | Freq: Four times a day (QID) | ORAL | Status: DC | PRN

## 2011-04-27 MED ORDER — FUROSEMIDE 80 MG PO TABS: 120.0000 mg | ORAL_TABLET | Freq: Two times a day (BID) | ORAL | Status: DC

## 2011-04-27 MED ORDER — POTASSIUM CHLORIDE ER 10 MEQ PO TBCR: 20.0000 meq | EXTENDED_RELEASE_TABLET | Freq: Two times a day (BID) | ORAL | Status: DC

## 2011-04-27 NOTE — Assessment & Plan Note (Addendum)
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 Surprisingly, his lungs sound clear, which makes me think his leg edema is a combination of sedentary behavior, venous stasis, and cardiomyopathy. He is also beginning to get contractures of the bilateral legs from not moving very much. This is very concerning, and we will be following this patient closely. The plan is to aggressively diuresis him and have him follow back up on Friday. We will also urgently refer him to cardiology, and get an echo. He fell out of contact with cardiology and was last seen in 2006. 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 Last EF was 20% in February of 2011 with global hypokinesis. - Lasix 120 mg twice a day - Potassium replacement 20 mEq twice a day - Followup on Friday to assess leg edema - Cardiology referral - Echocardiogram - EKG - CBC, Bmet, TSH - If this patient continues to have extensive edema on Friday, he should be reassessed and perhaps his Lasix dose should be raised further. I don't think he needs admission today, but he may be headed in that direction if he continues to decline. Depends on what his echo shows. Hopefully he can continue to be managed as an outpatient, as long as cardiology can assess him. - Can consider compression stockings, but that may not solve his problem - The patient's blood pressure and heart rate did not support addition of the beta blocker at this time, and he is on lisinopril at the moment.

## 2011-04-27 NOTE — Progress Notes (Signed)
Mother and pt aware of appt [facility] 04/29/11 2PM 2D echo. Stanton Kidney Dianara Smullen RN 04/27/11 4:30pm

## 2011-04-27 NOTE — Assessment & Plan Note (Signed)
Patient was diagnosed over the summer with partial rupture of his quadriceps, and went to physical therapy only one time. Therapy is difficult for this patient secondary to contralateral osteoarthritis, bilateral leg edema, and continued pain. All the patient does complain of leg pain in this area, I explained to him that his bilateral leg pain is most likely due to fluid overload rather than this small partial tear. - Vicodin for pain control - Physical therapy once fluid overload resolves

## 2011-04-27 NOTE — Progress Notes (Signed)
  Subjective:    Patient ID: William Davila, male    DOB: 08-18-1965, 45 y.o.   MRN: [id number]  HPI Patient was last seen in July, when I referred him to sports medicine to confirm the diagnosis of left quadriceps tendon rupture. They did in fact confirm the diagnosis of a small, partial tear, and referred the patient to physical therapy. The patient went to one therapy session before his right knee (contralateral) began giving him difficulty. At the time he was using crutches to ambulate, but since then he is become more immobile and is now wheelchair-bound.  Patient presents today with bilateral leg edema, pain, and difficulty ambulating. He denies any shortness of breath, orthopnea, or PND, and says his leg swelling has been progressive for 2 weeks. His medial left knee continues to bother him, which is the location of his orthopedic injury.   Review of Systems     Objective:   Physical Exam Physical Exam GEN: NAD.  Alert and oriented x 3.  Pleasant, conversant, and cooperative to exam. RESP:  CTAB, no w/r/r CARDIOVASCULAR: RRR, S1, S2, no m/r/g ABDOMEN: soft, NT/ND, NABS EXT: warm and dry. Extensive bilateral edema up to the knee        Assessment & Plan:

## 2011-04-28 NOTE — Progress Notes (Signed)
I discussed [name] with Dr Abner Greenspan and agree with his A/P. Pt's concern is his orthopedic injury but as Dr Abner Greenspan documented his cardiovascular issues are most pressing. LE edema but clear lungs would be concerning for R sided heart failure so an ECHO is being obtained. Since he does have a h/o sig decreased EF, aggressive diuresis is being started along with K supplementation and very close F/U. The cause of the decreased EF is unknown as no ischemic W/U has been done - ? H/O ETOH use, no documentation of cocaine use. Could it be tachy induced LV failure? His EKG is weird, LAD, prolonged Qtc, wide QRS, no p waves. Dr Clarene Duke read it as Afib with RVR. Appt in two days with Dr Eben Burow and has cards F/U.

## 2011-04-29 ENCOUNTER — Telehealth: Payer: Self-pay | Admitting: Licensed Clinical Social Worker

## 2011-04-29 ENCOUNTER — Ambulatory Visit (HOSPITAL_COMMUNITY)
Admission: RE | Admit: 2011-04-29 | Discharge: 2011-04-29 | Disposition: A | Discharge status: Home / Self Care | Payer: Medicaid Other | Source: Ambulatory Visit | Attending: Internal Medicine | Admitting: Internal Medicine

## 2011-04-29 DIAGNOSIS — I420 Dilated cardiomyopathy: Secondary | ICD-10-CM

## 2011-04-29 DIAGNOSIS — I428 Other cardiomyopathies: Secondary | ICD-10-CM | POA: Diagnosis present

## 2011-04-29 NOTE — Progress Notes (Signed)
*  PRELIMINARY RESULTS* Echocardiogram 2D Echocardiogram has been performed.  William Davila [facility] 04/29/2011, 3:06 PM

## 2011-04-29 NOTE — Telephone Encounter (Signed)
Called William Davila to make sure he understood and completed everything to start disability process.   Release for medical records was signed and witnessed and advised him that [employer] will request all his medical records.  William Davila will also send in his birth certificate.  His sister helped him with Adult Disability Report online.    William Davila to let us know when the decision comes in so he can file reconsideration.  William Davila he many want to get an attorney if it goes to appeals phase.   William Davila expressed understanding.  He is picking up his paperwork today to mail everything back to [employer].

## 2011-04-30 ENCOUNTER — Inpatient Hospital Stay (HOSPITAL_COMMUNITY)
Admission: AD | Admit: 2011-04-30 | Discharge: 2011-05-08 | DRG: 291 | Disposition: A | Discharge status: Home / Self Care | Payer: Medicaid Other | Source: Ambulatory Visit | Attending: Internal Medicine | Admitting: Internal Medicine

## 2011-04-30 ENCOUNTER — Other Ambulatory Visit: Payer: Self-pay

## 2011-04-30 ENCOUNTER — Ambulatory Visit (HOSPITAL_COMMUNITY)
Admission: RE | Admit: 2011-04-30 | Discharge: 2011-04-30 | Disposition: A | Discharge status: Home / Self Care | Payer: Medicaid Other | Source: Ambulatory Visit | Attending: Internal Medicine | Admitting: Internal Medicine

## 2011-04-30 ENCOUNTER — Encounter (HOSPITAL_COMMUNITY): Payer: Self-pay | Admitting: *Deleted

## 2011-04-30 ENCOUNTER — Ambulatory Visit (INDEPENDENT_AMBULATORY_CARE_PROVIDER_SITE_OTHER): Payer: Self-pay | Admitting: Internal Medicine

## 2011-04-30 ENCOUNTER — Encounter: Payer: Self-pay | Admitting: Internal Medicine

## 2011-04-30 DIAGNOSIS — E785 Hyperlipidemia, unspecified: Secondary | ICD-10-CM | POA: Diagnosis present

## 2011-04-30 DIAGNOSIS — I472 Ventricular tachycardia, unspecified: Secondary | ICD-10-CM | POA: Diagnosis not present

## 2011-04-30 DIAGNOSIS — Z6841 Body Mass Index (BMI) 40.0 and over, adult: Secondary | ICD-10-CM

## 2011-04-30 DIAGNOSIS — I428 Other cardiomyopathies: Secondary | ICD-10-CM | POA: Diagnosis present

## 2011-04-30 DIAGNOSIS — Z91199 Patient's noncompliance with other medical treatment and regimen due to unspecified reason: Secondary | ICD-10-CM

## 2011-04-30 DIAGNOSIS — I10 Essential (primary) hypertension: Secondary | ICD-10-CM

## 2011-04-30 DIAGNOSIS — E876 Hypokalemia: Secondary | ICD-10-CM | POA: Diagnosis not present

## 2011-04-30 DIAGNOSIS — I5023 Acute on chronic systolic (congestive) heart failure: Secondary | ICD-10-CM

## 2011-04-30 DIAGNOSIS — Z9119 Patient's noncompliance with other medical treatment and regimen: Secondary | ICD-10-CM

## 2011-04-30 DIAGNOSIS — I509 Heart failure, unspecified: Secondary | ICD-10-CM

## 2011-04-30 DIAGNOSIS — I4891 Unspecified atrial fibrillation: Secondary | ICD-10-CM

## 2011-04-30 DIAGNOSIS — I4729 Other ventricular tachycardia: Secondary | ICD-10-CM | POA: Diagnosis not present

## 2011-04-30 DIAGNOSIS — R570 Cardiogenic shock: Secondary | ICD-10-CM | POA: Diagnosis present

## 2011-04-30 DIAGNOSIS — E669 Obesity, unspecified: Secondary | ICD-10-CM | POA: Diagnosis present

## 2011-04-30 DIAGNOSIS — I5043 Acute on chronic combined systolic (congestive) and diastolic (congestive) heart failure: Principal | ICD-10-CM | POA: Diagnosis present

## 2011-04-30 DIAGNOSIS — R609 Edema, unspecified: Secondary | ICD-10-CM | POA: Diagnosis present

## 2011-04-30 DIAGNOSIS — R17 Unspecified jaundice: Secondary | ICD-10-CM | POA: Diagnosis not present

## 2011-04-30 DIAGNOSIS — I5042 Chronic combined systolic (congestive) and diastolic (congestive) heart failure: Secondary | ICD-10-CM | POA: Insufficient documentation

## 2011-04-30 HISTORY — DX: Chronic systolic (congestive) heart failure: I50.22

## 2011-04-30 HISTORY — DX: Essential (primary) hypertension: I10

## 2011-04-30 LAB — COMPREHENSIVE METABOLIC PANEL
Alkaline Phosphatase: 125 U/L — ABNORMAL HIGH (ref 39–117)
BUN: 12 mg/dL (ref 6–23)
Chloride: 99 mEq/L (ref 96–112)
Creatinine, Ser: 0.97 mg/dL (ref 0.50–1.35)
GFR calc Af Amer: >90 mL/min (ref >90)
Glucose, Bld: 93 mg/dL (ref 70–99)
Potassium: 3.9 mEq/L (ref 3.5–5.1)
Total Bilirubin: 1.8 mg/dL — ABNORMAL HIGH (ref 0.3–1.2)

## 2011-04-30 LAB — CBC
HCT: 43.3 % (ref 39.0–52.0)
Hemoglobin: 15.1 g/dL (ref 13.0–17.0)
MCH: 28.4 pg (ref 26.0–34.0)
MCHC: 34.9 g/dL (ref 30.0–36.0)
RBC: 5.31 MIL/uL (ref 4.22–5.81)

## 2011-04-30 LAB — DIFFERENTIAL
Eosinophils Absolute: 0.1 10*3/uL (ref 0.0–0.7)
Lymphs Abs: 1.8 10*3/uL (ref 0.7–4.0)
Monocytes Absolute: 0.5 10*3/uL (ref 0.1–1.0)
Monocytes Relative: 5 % (ref 3–12)
Neutro Abs: 7.5 10*3/uL (ref 1.7–7.7)
Neutrophils Relative %: 76 % (ref 43–77)

## 2011-04-30 MED ORDER — ONDANSETRON HCL 4 MG/2ML IJ SOLN: 4.0000 mg | Freq: Four times a day (QID) | INTRAMUSCULAR | Status: DC | PRN

## 2011-04-30 MED ORDER — SODIUM CHLORIDE 0.9 % IJ SOLN
3.0000 mL | Freq: Two times a day (BID) | INTRAMUSCULAR | Status: DC
  Administered 2011-04-30 – 2011-05-04 (×8): 3 mL via INTRAVENOUS

## 2011-04-30 MED ORDER — SODIUM CHLORIDE 0.9 % IJ SOLN: 3.0000 mL | INTRAMUSCULAR | Status: DC | PRN

## 2011-04-30 MED ORDER — SODIUM CHLORIDE 0.9 % IV SOLN: 250.0000 mL | INTRAVENOUS | Status: DC

## 2011-04-30 MED ORDER — FUROSEMIDE 10 MG/ML IJ SOLN
160.0000 mg | Freq: Once | INTRAVENOUS | Status: AC
  Administered 2011-04-30: 160 mg via INTRAVENOUS
  Filled 2011-04-30: qty 16

## 2011-04-30 MED ORDER — LISINOPRIL 20 MG PO TABS
20.0000 mg | ORAL_TABLET | Freq: Every day | ORAL | Status: DC
  Administered 2011-05-01 – 2011-05-03 (×3): 20 mg via ORAL
  Filled 2011-04-30 (×4): qty 1

## 2011-04-30 MED ORDER — ENOXAPARIN SODIUM 40 MG/0.4ML ~~LOC~~ SOLN
40.0000 mg | SUBCUTANEOUS | Status: DC
  Administered 2011-04-30 – 2011-05-04 (×4): 40 mg via SUBCUTANEOUS
  Filled 2011-04-30 (×6): qty 0.4

## 2011-04-30 MED ORDER — ACETAMINOPHEN 325 MG PO TABS
650.0000 mg | ORAL_TABLET | ORAL | Status: DC | PRN
  Administered 2011-05-01 – 2011-05-07 (×4): 650 mg via ORAL
  Filled 2011-04-30 (×4): qty 2

## 2011-04-30 NOTE — Assessment & Plan Note (Addendum)
I will obtain a stat 12-lead EKG. patient needs to be anticoagulated in the setting of heart failure as CHADS2 Score is atleast 2 with CHF and hypertension.

## 2011-04-30 NOTE — Assessment & Plan Note (Signed)
The patient has a dilated cardiomyopathy since 2001 which has progressed acutely. Patient is currently not on optimal medical therapy which may be one of the reasons for progression. Patient is very high risk for sudden cardiac death and may benefit from cardiac resynchronization device placement.  Patient is at high risk of having a ventricular arrhythmia and after discussion with Dr. Rogelia Boga and Dr. Phillips Odor, it was decided that we should get a cardiology consult by admitting the patient to the hospital as he cannot risk a 15 day delay to get a cardiology consult as outpatient. Patient is agreeable to the plan. Dr. Arvilla Market was called and informed about the patient.

## 2011-04-30 NOTE — Progress Notes (Signed)
  Subjective:    Patient ID: William Davila, male    DOB: Mar 25, 1966, 45 y.o.   MRN: [id number]  HPI  William Davila is a 45 over the past medical history most significant for dilated congestive cardiomyopathy since 2001. His ejection fraction at that time 20%. Patient has had history of multiple hospitalizations in the past for congestive heart failure exacerbation. Patient was followed by Dr. Garnette Scheuermann but has not followed up with any cardiologist since 2006. Patient was on optimal medical therapy as per the discharge note from 2006 but has not been that compliant with his medications. His most recent 2-D echocardiogram in March 2011 was suggestive of moderate dilated cardiomyopathy with ejection fraction of 20%. He was seen in the clinic by Dr. Joesphine Bare on November 13th for acute lower extremity swelling since last 2 weeks. Patient did not complain of any chest pain, shortness of breath, orthopnea or PND. Patient denies any irregular heart rhythms or palpitations. Patient states that he has not had any alcohol in about 5 months but had been drinking occasionally prior to that. Patient's Lasix was increased to 120 mg twice a day. A 2-D echocardiogram ordered on November 13th suggests severe dilated heart with ejection fraction 10-20%. Patient's EKG was suggestive of atrial fibrillation with rapid ventricular response.   Patient comes in today feeling just about the same. Does not complain chest pain, shortness of breath, cough, orthopnea or PND. Denies any palpitations. Patient is unable to sleep at night but that is mostly because of pain in his left knee.  No other complaints at this time.  Review of Systems  Constitutional: Negative for fever, activity change and appetite change.  HENT: Negative for sore throat.   Respiratory: Negative for cough and shortness of breath.   Cardiovascular: Positive for leg swelling. Negative for chest pain.  Gastrointestinal: Negative for nausea, abdominal pain,  diarrhea, constipation and abdominal distention.  Genitourinary: Negative for frequency, hematuria and difficulty urinating.  Musculoskeletal: Positive for joint swelling and arthralgias.  Neurological: Negative for dizziness and headaches.  Psychiatric/Behavioral: Negative for suicidal ideas and behavioral problems.       Objective:   Physical Exam  Constitutional: He is oriented to person, place, and time. He appears well-developed and well-nourished.  HENT:  Head: Normocephalic and atraumatic.  Eyes: Conjunctivae and EOM are normal. Pupils are equal, round, and reactive to light. No scleral icterus.  Neck: Normal range of motion. Neck supple. No JVD present. No thyromegaly present.  Cardiovascular: Intact distal pulses.        Patient is tachycardic and it's difficult to make out any heart sounds  Pulmonary/Chest: Effort normal and breath sounds normal. No respiratory distress. He has no wheezes. He has no rales.  Abdominal: Soft. Bowel sounds are normal. He exhibits no distension and no mass. There is no tenderness. There is no rebound and no guarding.  Musculoskeletal: He exhibits edema and tenderness.       Patient has 3+ pitting edema bilaterally up to his knees left more than right.  Lymphadenopathy:    He has no cervical adenopathy.  Neurological: He is alert and oriented to person, place, and time.  Psychiatric: He has a normal mood and affect. His behavior is normal.          Assessment & Plan:

## 2011-04-30 NOTE — Progress Notes (Signed)
  Subjective:    Patient ID: William Davila, male    DOB: Feb 17, 1966, 45 y.o.   MRN: [id number]  HPI    Review of Systems     Objective:   Physical Exam        Assessment & Plan:

## 2011-04-30 NOTE — H&P (Signed)
Hospital Admission Note Date: 04/30/2011  Patient name: William Davila Medical record number: [id number] Date of birth: 1965/11/27 Age: 45 y.o. Gender: male PCP: William Cosier, MD, MD  Medical Service: Internal medicine teaching service - Maurice Davila  Attending physician:  William Davila    1st Contact: William Davila   Pager: [phone] 2nd Contact: William Davila    Pager: [phone]  After 5 pm or weekends: 1st Contact:      Pager: [phone] 2nd Contact:      Pager: [phone]  Chief Complaint: Increased lower extremity swelling  History of Present Illness: William Davila is a 45 year old gentleman with past medical history significant for dilated cardiomyopathy and EF of 10-15% per 2-D echo on 04/27/2011.  He presents as an admission from the outpatient clinic for persistent swelling despite outpatient treatment with increased by mouth Lasix. The patient states he initially noted some increased lower extremity edema approximately one month ago that has gotten worse over the past week. He was seen in the clinic on November 13 at which time his Lasix was increased to 120 mg by mouth twice a day and a repeat 2-D echocardiogram was obtained that revealed a slight decrease in his ejection fraction from 20% to 10-15%. The patient feels that his lower extremities somewhat improved following the increase in his Lasix dose however he has not yet returned to baseline. He denies shortness of breath, chest pain, syncope, or palpitations.  He states he used to follow with a cardiologist by the name of William Davila but has not seen a cardiologist since approximately 2006.  He has no other concerns or complaints today.  Meds: Medications Prior to Admission  Medication Sig Dispense Refill  . furosemide (LASIX) 80 MG tablet Take 1.5 tablets (120 mg total) by mouth 2 (two) times daily.  270 tablet  3  . HYDROcodone-acetaminophen (VICODIN) 5-500 MG per tablet Take 2 tablets by mouth every 6 (six) hours as needed for pain.   30 tablet  0  . lisinopril (PRINIVIL,ZESTRIL) 20 MG tablet Take 1 tablet (20 mg total) by mouth daily.  30 tablet  0  . potassium chloride (K-DUR) 10 MEQ tablet Take 2 tablets (20 mEq total) by mouth 2 (two) times daily.  10 tablet  0    Allergies: Beta blockers and Bidil noted as allergies in EMR however patient denies any known drug allergies and is currently tolerating lisinopril  Past Medical History  Diagnosis Date  . CHF (congestive heart failure)   . Dilated cardiomyopathy 05/2008    2D Echo 2/11: estimated ejection fraction was 20%. Diffuse hypokinesis.  . Hyperlipidemia   . Hypertension   . Morbid obesity   . Dysrhythmia     bradicadia   Past Surgical History  Procedure Date  . No past surgeries     Review of Systems: Pertinent items are noted in HPI.  Physical Exam: Blood pressure 100/80, pulse 120, temperature 97.4 F (36.3 C), temperature source Oral, resp. rate 21, height 5\' 11"  (1.803 m), weight 346 lb 13 oz (157.312 kg), SpO2 92.00%. GEN: No apparent distress.  Alert and oriented x 3.  Pleasant, conversant, and cooperative to exam. HEENT: head is autraumatic and normocephalic.  Neck is supple without palpable masses or lymphadenopathy.  No JVD or carotid bruits.  Vision intact.  EOMI.  PERRLA.  Sclerae anicteric.  Conjunctivae without pallor or injection. Mucous membranes are moist.  Oropharynx is without erythema, exudates, or other abnormal lesions.   RESP:  Lungs are clear to  ascultation bilaterally with good air movement.  No wheezes, ronchi, or rubs. CARDIOVASCULAR: regular rate, normal rhythm.  Clear S1, S2, no murmurs, gallops, or rubs.  Laterally displaced PMI noted.   ABDOMEN: soft, non-tender, non-distended.  Bowels sounds present in all quadrants and normoactive.  No palpable masses. EXT: warm and dry.   No clubbing or cyanosis.  +3 pitting edema in bilateral lower extremities. SKIN: warm and dry with normal turgor.  No rashes or abnormal lesions  observed. NEURO: no focal deficit   Lab results: all pending   Assessment & Plan by Problem: #Acute CHF exacerbation: pt has known chronic heart failure with an EF of 10-15% and is experiencing increased lower extremity edema.  This is consistent with an acute exacerbation of his chronic heart failure.  He would also benefit from establishing care with a cardiologist were continued management and discussion of possible ICD placement. - Admit to telemetry - 12 lead EKG in am - Strict I.'s and O.'s with daily weights - PA and lateral chest x-ray - Check BMP - Begin diuresis with Lasix monitored 60 mg IV x1 - Continue home ACE inhibitor - Check urine drug screen - heart healthy diet  #HTN: Will continue lisinopril and monitor BP.  #HLD: Age and is currently not taking statins or other medication for treatment of dyslipidemia. Will check fasting lipid panel and consider starting statin if indicated.Marland Kitchen  #DVT prophylaxis: lovenox   Signed: Isaly Davila 04/30/2011, 6:31 PM

## 2011-04-30 NOTE — Progress Notes (Signed)
IV started with 22g 1in IW to left hand - excellent blood return- saline flush lock established - site WNL - dressing dry and in tact. Dorie Rank, RN, 04/30/2011 at 5: 11 P O2 sat rechecked on room air before transfer to 3737 and pt denies shortness of breath or chest pain. Family at side. Dorie Rank, RN, 04/30/2011, 5:13P Report to nurse for [phone] called at

## 2011-05-01 ENCOUNTER — Inpatient Hospital Stay (HOSPITAL_COMMUNITY): Payer: Medicaid Other

## 2011-05-01 LAB — BASIC METABOLIC PANEL
BUN: 12 mg/dL (ref 6–23)
BUN: 14 mg/dL (ref 6–23)
Chloride: 100 mEq/L (ref 96–112)
Chloride: 96 mEq/L (ref 96–112)
Creatinine, Ser: 1.01 mg/dL (ref 0.50–1.35)
GFR calc Af Amer: >90 mL/min (ref >90)
GFR calc non Af Amer: 88 mL/min — ABNORMAL LOW (ref >90)
GFR calc non Af Amer: 89 mL/min — ABNORMAL LOW (ref >90)
Glucose, Bld: 97 mg/dL (ref 70–99)
Glucose, Bld: 89 mg/dL (ref 70–99)
Potassium: 2.5 mEq/L — CL (ref 3.5–5.1)
Potassium: 3.2 mEq/L — ABNORMAL LOW (ref 3.5–5.1)
Sodium: 140 mEq/L (ref 135–145)

## 2011-05-01 LAB — LIPID PANEL
Cholesterol: 100 mg/dL (ref 0–200)
Total CHOL/HDL Ratio: 3.7 RATIO
Triglycerides: 54 mg/dL (ref <150)
VLDL: 11 mg/dL (ref 0–40)

## 2011-05-01 LAB — RAPID URINE DRUG SCREEN, HOSP PERFORMED
Amphetamines: NOT DETECTED
Barbiturates: NOT DETECTED
Benzodiazepines: NOT DETECTED

## 2011-05-01 LAB — PRO B NATRIURETIC PEPTIDE: Pro B Natriuretic peptide (BNP): 3355 pg/mL — ABNORMAL HIGH (ref 0–125)

## 2011-05-01 MED ORDER — LIVING BETTER WITH HEART FAILURE BOOK
Freq: Once | Status: DC
  Filled 2011-05-01 (×2): qty 1

## 2011-05-01 MED ORDER — POTASSIUM CHLORIDE CRYS ER 20 MEQ PO TBCR
40.0000 meq | EXTENDED_RELEASE_TABLET | ORAL | Status: AC
  Administered 2011-05-01 (×3): 40 meq via ORAL
  Filled 2011-05-01 (×3): qty 2

## 2011-05-01 NOTE — H&P (Signed)
Internal Medicine Attending Admission Note Date: 05/01/2011  Patient name: William Davila Medical record number: [id number] Date of birth: December 23, 1965 Age: 45 y.o. Gender: male  I saw and evaluated the patient. I reviewed the resident's note and I agree with the resident's findings and plan as documented in the resident's note.  Chief Complaint(s): lower extremity swelling  History - key components related to admission:  William Davila is a 45 year old man with a past medical history significant for a dilated cardiomyopathy and a left ventricular ejection fraction of 10-15%. Over the last month he has noted increased lower extremity swelling bilaterally. He's been managed in clinic by Dr. Berlinda Last with increasing doses of Lasix. This ha resulted in a slight improvement in his lower extremity swelling. He was scheduled for routine follow-up in the clinic yesterday afternoon. Another physician saw William Davila at that time and was concerned about the lower extremity edema. He was therefore admitted for volume overload. William Davila denies any orthopnea, paroxysmal nocturnal dyspnea, shortness of breath, chest pain or palpitations. He is scheduled to see a cardiologist to help manage his cardiomyopathy in the next few weeks.  Physical Exam - key components related to admission:  Filed Vitals:   04/30/11 2001 04/30/11 2200 05/01/11 0558 05/01/11 1400  BP: 109/74 95/64 100/71 107/64  Pulse: 109 102 104 103  Temp: 98.1 F (36.7 C) 97.8 F (36.6 C) 97.9 F (36.6 C) 97.4 F (36.3 C)  TempSrc: Oral Oral Oral Oral  Resp:  24 16 21   Height:      Weight:   345 lb 14.4 oz (156.9 kg)   SpO2: 100% 98% 100% 98%   Gen.: Well-developed, well-nourished, obese gentleman sitting comfortably in with his legs hanging over the edge. Lungs: Clear to auscultation bilaterally without wheezes. rhonchi or rales. Heart: Tachycardic, regular rhythm, without murmurs, rubs, or gallops. Abdomen: Obese, soft, non-tender, active bowel  sounds. Extremities: 3+ pitting edema in the bilateral lower extremities below the knees with early chronic skin changes.  Lab results:   Basic Metabolic Panel:  Basename 05/01/11 1600 05/01/11 0600  NA 135 140  K 3.2* 2.5*  CL 96 100  CO2 28 28  GLUCOSE 89 97  BUN 14 12  CREATININE 1.01 1.02  CALCIUM 9.2 9.1  MG -- --  PHOS -- --   Liver Function Tests:  [facility] 04/30/11 2004  AST 27  ALT 18  ALKPHOS 125*  BILITOT 1.8*  PROT 7.9  ALBUMIN 3.2*   CBC:  Basename 04/30/11 2004  WBC 10.0  NEUTROABS 7.5  HGB 15.1  HCT 43.3  MCV 81.5  PLT 546*   BNP:  Basename 05/01/11 0500  POCBNP 3355.0*   Fasting Lipid Panel:  Basename 05/01/11 0600  CHOL 100  HDL 27*  LDLCALC 62  TRIG 54  CHOLHDL 3.7  LDLDIRECT --    Imaging results:  X-ray Chest Pa And Lateral  05/01/2011  *RADIOLOGY REPORT*  Clinical Data: CHF exacerbation.  CHEST - 2 VIEW  Comparison: 07/13/2006  Findings: Cardiomegaly with central vascular congestion.  No focal consolidation, pleural effusion, or pneumothorax. Mild interstitial prominence.  No acute osseous abnormality.  IMPRESSION: Cardiomegaly with central vascular congestion. Mild interstitial prominence may reflect a mild edema pattern.  Original Report Authenticated By: Waneta Martins, M.D.    Other results: QMV:HQIONG sinus tachycardia at 110 beats per minute, first degree AV block, left axis deviation, occasional premature ventricular contractions.  Assessment & Plan by Problem:  William Davila is a 45 year old man  with a history of a dilated cardiomyopathy and an ejection fraction of 10-15% who presents to the clinic with a symptomatic and improving lower extremity swelling. He was admitted for more aggressive diuresis. Fortunately, he is asymptomatic otherwise. It is prudent to continue with the outpatient oral diuresis and possible increase in the lisinopril dose to 40 mg daily. This lisinopril dose is the target dose for patients with  cardiomyopathies. I am concerned that if we aggressively over diuresis him in the hospital it may preclude [place] from escalating his lisinopril dosing. Despite a systolic blood pressure of 100 he is asymptomatic without dizziness and there may be room to eventually increase the lisinopril dose as an outpatient.  #1 dilated cardiomyopathy: Will give one dose of IV Lasix and check potassium and creatinine. Will replete potassium as indicated.  Will place back on his oral Lasix dose that was improving his lower extremity edema as an outpatient on the second day of admission. We will discharge him with follow-up in clinic with his primary care physician Dr. Berlinda Last to further manage his Lasix dosing and consider increasing his lisinopril dose, if possible. He will also follow-up with his cardiologist at the already scheduled appointment. William Davila was advised to keep his legs elevated as much as possible. If this swelling decreases, we may be able to also try compression hose in the future. Finally, we will have to further explore this documented beta blocker allergy which the patient denies is accurate. It is likely that beta blocker therapy will be one of the cornerstones and the chronic management of his dilated cardiomyopathy.

## 2011-05-01 NOTE — Progress Notes (Signed)
Subjective:   Feeling improved today. States edema has decreased greatly. Does not feel symptomatic when blood pressure has been low. He told me that the only time he felt his blood pressure being too low was when he had a systolic pressure of 89. He denies shortness of breath, chest pain, headache, lightheadedness, dizziness, abdominal pain. He states his only complaint besides the leg edema is that his left quadriceps tendon is tender where he has the rupture. Denies any fevers or chills, night sweats nausea or vomiting. Last bowel movement was this morning.   Objective: Vital signs in last 24 hours: Filed Vitals:   04/30/11 2001 04/30/11 2200 05/01/11 0558 05/01/11 1400  BP: 109/74 95/64 100/71 107/64  Pulse: 109 102 104 103  Temp: 98.1 F (36.7 C) 97.8 F (36.6 C) 97.9 F (36.6 C) 97.4 F (36.3 C)  TempSrc: Oral Oral Oral Oral  Resp:  24 16 21   Height:      Weight:   345 lb 14.4 oz (156.9 kg)   SpO2: 100% 98% 100% 98%   Weight change:   Intake/Output Summary (Last 24 hours) at 05/01/11 1556 Last data filed at 05/01/11 1052  Gross per 24 hour  Intake    363 ml  Output   2100 ml  Net  -1737 ml   Physical Exam:  General: Obese man lying in bed HEENT: PERRL, EOMI, no scleral icterus Cardiac: Regular tachycardia. Audible S4 gallop. S1 and S2 normal. No murmurs or rubs appreciated. Pulm: clear to auscultation bilaterally, moving normal volumes of air Abd: soft, nontender, obese abdomen. Bowel sounds are hypoactive. Ext: warm and well perfused, pedal edema decreased today still 3+ in the feet and ankles. The posterior surfaces of the thighs also have 2+ pitting edema. Neuro: alert and oriented X3, cranial nerves II-XII grossly intact, no focal neurologic deficit Lab Results: Basic Metabolic Panel:  Lab 05/01/11 1610 04/30/11 2004  NA 140 140  K 2.5* 3.9  CL 100 99  CO2 28 29  GLUCOSE 97 93  BUN 12 12  CREATININE 1.02 0.97  CALCIUM 9.1 9.8  MG -- --  PHOS -- --    Liver Function Tests:  Lab 04/30/11 2004  AST 27  ALT 18  ALKPHOS 125*  BILITOT 1.8*  PROT 7.9  ALBUMIN 3.2*   CBC:  Lab 04/30/11 2004 04/27/11 1612  WBC 10.0 10.7*  NEUTROABS 7.5 --  HGB 15.1 15.3  HCT 43.3 44.4  MCV 81.5 81.6  PLT 546* 580*   BNP:  Lab 05/01/11 0500  POCBNP 3355.0*   Hemoglobin A1C: Lab Results  Component Value Date   HGBA1C 6.1 07/10/2009   Fasting Lipid Panel:  Lab 05/01/11 0600  CHOL 100  HDL 27*  LDLCALC 62  TRIG 54  CHOLHDL 3.7  LDLDIRECT --   Thyroid Function Tests:  Lab 04/27/11 1612  TSH 2.497   Studies/Results: X-ray Chest Pa And Lateral  05/01/2011  *RADIOLOGY REPORT*  Clinical Data: CHF exacerbation.  CHEST - 2 VIEW  Comparison: 07/13/2006  Findings: Cardiomegaly with central vascular congestion.  No focal consolidation, pleural effusion, or pneumothorax. Mild interstitial prominence.  No acute osseous abnormality.  IMPRESSION: Cardiomegaly with central vascular congestion. Mild interstitial prominence may reflect a mild edema pattern.  Original Report Authenticated By: Waneta Martins, M.D.   Medications: I have reviewed the patient's current medications. Scheduled Meds:    . a stronger pump book   Does not apply Once  . enoxaparin  40 mg Subcutaneous Q24H  .  furosemide  160 mg Intravenous Once  . lisinopril  20 mg Oral Daily  . potassium chloride  40 mEq Oral Q4H  . sodium chloride  3 mL Intravenous Q12H   Continuous Infusions:    . sodium chloride     PRN Meds:.acetaminophen, ondansetron (ZOFRAN) IV, sodium chloride Assessment/Plan: Principal Problem:   1) Acute CHF exacerbation: pt has known chronic heart failure with an EF of 10-15% and is experiencing increased lower extremity edema. This is consistent with an acute exacerbation of his chronic heart failure.  ProBNP is elevated at 3355.  He has responded well to Lasix putting out over 1.7 L. If he continues to diuresis well we will plan on discharge  tomorrow with close followup. - Continue telemetry  - 12 lead EKG pending - Continue Strict I.'s and O.'s with daily weights  - Continue home ACE inhibitor   2) hypokalemia: Mr. Muto potassium was down to 2.5, likely secondary to IV furosemide. We will replete his potassium and recheck BMP in the afternoon.  3) HTN: Will continue lisinopril and monitor BP. We will like blood pressure to be as low as possible as long as he is asymptomatic.  4) HLD: Very well controlled without statin. No need to start.   5) DVT prophylaxis: lovenox    LOS: 1 day   Jadalynn Burr 05/01/2011, 3:56 PM

## 2011-05-01 NOTE — Progress Notes (Signed)
Critical lab value of potassium of 2.5. Dr. Candy Sledge notified.

## 2011-05-02 ENCOUNTER — Encounter (HOSPITAL_COMMUNITY): Payer: Self-pay | Admitting: Physician Assistant

## 2011-05-02 DIAGNOSIS — I5042 Chronic combined systolic (congestive) and diastolic (congestive) heart failure: Secondary | ICD-10-CM | POA: Insufficient documentation

## 2011-05-02 DIAGNOSIS — R609 Edema, unspecified: Secondary | ICD-10-CM

## 2011-05-02 LAB — MAGNESIUM: Magnesium: 1.7 mg/dL (ref 1.5–2.5)

## 2011-05-02 LAB — BASIC METABOLIC PANEL
BUN: 14 mg/dL (ref 6–23)
CO2: 28 mEq/L (ref 19–32)
Calcium: 9.6 mg/dL (ref 8.4–10.5)
Chloride: 98 mEq/L (ref 96–112)
Creatinine, Ser: 1 mg/dL (ref 0.50–1.35)

## 2011-05-02 LAB — BILIRUBIN, FRACTIONATED(TOT/DIR/INDIR)
Bilirubin, Direct: 0.8 mg/dL — ABNORMAL HIGH (ref 0.0–0.3)
Total Bilirubin: 1.5 mg/dL — ABNORMAL HIGH (ref 0.3–1.2)

## 2011-05-02 MED ORDER — POTASSIUM CHLORIDE CRYS ER 20 MEQ PO TBCR
40.0000 meq | EXTENDED_RELEASE_TABLET | Freq: Every day | ORAL | Status: DC
  Administered 2011-05-03: 40 meq via ORAL
  Filled 2011-05-02 (×2): qty 2

## 2011-05-02 MED ORDER — FUROSEMIDE 10 MG/ML IJ SOLN
80.0000 mg | Freq: Three times a day (TID) | INTRAMUSCULAR | Status: DC
  Administered 2011-05-02 – 2011-05-06 (×11): 80 mg via INTRAVENOUS
  Filled 2011-05-02 (×15): qty 8

## 2011-05-02 MED ORDER — FUROSEMIDE 10 MG/ML IJ SOLN
80.0000 mg | Freq: Three times a day (TID) | INTRAMUSCULAR | Status: DC
  Administered 2011-05-02: 80 mg via INTRAVENOUS
  Filled 2011-05-02 (×2): qty 8

## 2011-05-02 MED ORDER — POTASSIUM CHLORIDE CRYS ER 20 MEQ PO TBCR
40.0000 meq | EXTENDED_RELEASE_TABLET | Freq: Once | ORAL | Status: AC
  Administered 2011-05-02: 40 meq via ORAL
  Filled 2011-05-02: qty 2

## 2011-05-02 MED ORDER — FUROSEMIDE 10 MG/ML IJ SOLN: 80.0000 mg | Freq: Three times a day (TID) | INTRAMUSCULAR | Status: DC

## 2011-05-02 NOTE — Consult Note (Signed)
Clinical Summary William Davila is a 45 y.o.male presently admitted complaining of progressive lower extremity edema. Cardiac history is reviewed below including long-standing dilated cardiomyopathy, presumably nonischemic in the setting of hypertension, and documented noncompliance with previous discharge from [facility] cardiology. He was admitted from the outpatient internal medicine teaching service clinic. He states he has a "fluid pill" that he takes intermittently, although has not been on any standing regimen for his cardiomyopathy.  Record review finds no prior ischemic testing, no cardiac catheterization results. During the present stay, a has diuresed a little over 2000 cc with intravenous Lasix, but still appears markedly volume overloaded peripherally. BNP is 3355 and CXR shows cardiomegaly with mild interstitial edema and vascular congestion. Renal function has been normal and he reports normal appetite.  Followup echocardiogram shows LVEF 10-20% with restrictive diastolic filling pattern, mild RV dysfunction, and moderate to severe TR with RVSP 36 mmHg.  He denies any recent chest pain, no sense of palpitations, no syncope. Reports NYHA class II dyspnea on exertion.   Allergies  Allergen Reactions  . Beta Adrenergic Blockers     REACTION: fatigue and increased dyspnea with beta blockers.  . Bidil     REACTION: headache    Medications    . a stronger pump book   Does not apply Once  . enoxaparin  40 mg Subcutaneous Q24H  . lisinopril  20 mg Oral Daily  . potassium chloride  40 mEq Oral Q4H  . sodium chloride  3 mL Intravenous Q12H    Past Medical History  Diagnosis Date  . Chronic systolic heart failure     Diagnosed 2001; prior patient of Dr. Mendel Ryder with [facility] - discharged from practice  . Dilated cardiomyopathy     LVEF 10-20%  . Hyperlipidemia   . Essential hypertension, benign   . Morbid obesity     Past Surgical History  Procedure Date  . No past surgeries      Family History  Problem Relation Age of Onset  . Hypertension Mother     Social History Mr. Stockley reports that he has never smoked. He has never used smokeless tobacco. Mr. Mcshan reports that he drinks alcohol.  Review of Systems As outlined above, otherwise reportedly negative.  Physical Examination Temp:  [97.4 F (36.3 C)-97.6 F (36.4 C)] 97.6 F (36.4 C) (11/18 0500) Pulse Rate:  [103-105] 103  (11/18 0500) Resp:  [18-21] 18  (11/18 0500) BP: (107-109)/(64-82) 109/82 mmHg (11/18 0500) SpO2:  [98 %] 98 % (11/18 0500) Weight:  [354 lb 0.9 oz (160.6 kg)] 354 lb 0.9 oz (160.6 kg) (11/18 0500)  Weight change: 7 lb 4 oz (3.288 kg)  I/O last 3 completed shifts: In: 843 [P.O.:840; I.V.:3] Out: 3100 [Urine:3100]  Morbidly obese male in no acute distress. HEENT: Conjunctiva and lids normal, oropharynx clear. Neck: Supple, increased girth, difficult to assess JVP, no carotid bruits, no thyromegaly. Lungs: Clear to auscultation, faint basilar rhonchi. Cardiac: Regular rate and rhythm, S3 noted, rule out summation gallop, no pericardial rub. Abdomen: Obese, nontender, positive HJR, bowel sounds present, no guarding or rebound. Extremities: Chronic appearing, woody pitting edema 3-4+. Skin: Warm and dry. Musculoskeletal: No kyphosis. Neuropsychiatric: Alert and oriented x3, affect grossly appropriate.  Testing Lab Results  Component Value Date   WBC 10.0 04/30/2011   HGB 15.1 04/30/2011   HCT 43.3 04/30/2011   MCV 81.5 04/30/2011   PLT 546* 04/30/2011    Lab Results  Component Value Date   CREATININE 1.00 05/02/2011  BUN 14 05/02/2011   NA 136 05/02/2011   K 3.4* 05/02/2011   CL 98 05/02/2011   CO2 28 05/02/2011    Lab Results  Component Value Date   ALT 18 04/30/2011   AST 27 04/30/2011   ALKPHOS 125* 04/30/2011   BILITOT 1.8* 04/30/2011    Lab Results  Component Value Date   TSH 2.497 04/27/2011    Lab Results  Component Value Date   LDLCALC  62 05/01/2011    ECG Probable sinus tachycardia with PR prolongation, QRS 130 ms, LAD, LAE, increased voltage. Cannot completely exclude other long RP tachycardia.  Imaging Echocardiogram 04/29/2011: - Left ventricle: The cavity size was severely dilated. Wall thickness was increased in a pattern of moderate LVH. Systolic function was severely reduced. The estimated ejection fraction was in the range of 10% to 20%. Doppler parameters are consistent with an irreversible restrictive pattern, indicative of decreased left ventricular diastolic compliance and/or increased left atrial pressure (grade 4 diastolic dysfunction). Doppler parameters are consistent with high ventricular filling pressure. - Mitral valve: Mild to moderate regurgitation. - Left atrium: The atrium was severely dilated. - Right ventricle: The cavity size was mildly dilated. Systolic function was mildly reduced. Systolic pressure was mildly to moderately increased. - Right atrium: The atrium was mildly dilated. - Atrial septum: No defect or patent foramen ovale was identified. - Tricuspid valve: Moderate-severe regurgitation. - Pulmonary arteries: PA peak pressure: 36mm Hg (S).   Impression  1. Acute on chronic combined CHF, LVEF 10-20% with restrictive diastolic filling pattern. Peripheral volume overload predominating. Complicated by noncompliance with medications and with no regular cardiology followup. Reportedly, he is scheduled to be seen by Dr. Excell Seltzer in our practice on 11/29.  2. Dilated cardiomyopathy, likely nonischemic. No previous ischemic testing noted on record review. Has component of RV dysfunction and mildly to moderately increased pulmonary pressure. He is not compensated or on optimal medical regimen. QRS duration of 130 noted and is of consideration in terms of possible advanced treatment modalities (bi-V ICD), however this is not a realistic option to even consider until the basics are addressed  and he establishes regular followup first.  3. Reported history of beta-blocker intolerance, details not clear.  4. Morbid obesity. Weight loss indicated. OSA is very likely as well.  5. Regular alcohol use on weekends. Complete cessation is necessary.  6. Tachycardia, likely sinus, but cannot completely exclude other long RP tachycardia (? tachycardia medicated CM).  Recommendations  He is not ready for discharge. Plan to start Lasix 80 mg IV Q8 hours, supplement potassium, continue ACE-I. No beta-blocker trial until volume status improves. Frankly, a CHF service consultation makes the most sense, to help with further workup and establish followup in clinic. He should likely have a R/L heart catheterization during this admission to better understand hemodynamics and help guide therapy. Followup BMET AM.  Jonelle Sidle, M.D., F.A.C.C.

## 2011-05-02 NOTE — Progress Notes (Signed)
Pt had 12 beats of wide complex tachycardia at 1949. Pt asymptomatic. bp 110/74. Tereso Newcomer PA notified and orders for am labs received. Will enter labs and continue to monitor.

## 2011-05-02 NOTE — Progress Notes (Signed)
Subjective: Pt feels well, able to sleep supine a little bit last night.  No events overnight  Objective: Vital signs in last 24 hours: Filed Vitals:   05/01/11 0558 05/01/11 1400 05/01/11 2200 05/02/11 0500  BP: 100/71 107/64 109/79 109/82  Pulse: 104 103 105 103  Temp: 97.9 F (36.6 C) 97.4 F (36.3 C) 97.4 F (36.3 C) 97.6 F (36.4 C)  TempSrc: Oral Oral    Resp: 16 21 20 18   Height:      Weight: 345 lb 14.4 oz (156.9 kg)   354 lb 0.9 oz (160.6 kg)  SpO2: 100% 98% 98% 98%   Weight change: 7 lb 4 oz (3.288 kg)  Intake/Output Summary (Last 24 hours) at 05/02/11 1404 Last data filed at 05/02/11 1000  Gross per 24 hour  Intake    480 ml  Output   1500 ml  Net  -1020 ml   Physical Exam: Physical Exam GEN: NAD.  Alert and oriented x 3.  Pleasant, conversant, and cooperative to exam. RESP:  CTAB, no w/r/r CARDIOVASCULAR: RRR, distant S1, S2 ABDOMEN: soft, NT/ND, NABS EXT: warm and dry. Pitting edema up to knee b/l  Lab Results: Basic Metabolic Panel:  Lab 05/02/11 1610 05/01/11 1600  NA 136 135  K 3.4* 3.2*  CL 98 96  CO2 28 28  GLUCOSE 97 89  BUN 14 14  CREATININE 1.00 1.01  CALCIUM 9.6 9.2  MG 1.7 --  PHOS -- --   Liver Function Tests:  Lab 04/30/11 2004  AST 27  ALT 18  ALKPHOS 125*  BILITOT 1.8*  PROT 7.9  ALBUMIN 3.2*   CBC:  Lab 04/30/11 2004 04/27/11 1612  WBC 10.0 10.7*  NEUTROABS 7.5 --  HGB 15.1 15.3  HCT 43.3 44.4  MCV 81.5 81.6  PLT 546* 580*   BNP:  Lab 05/01/11 0500  POCBNP 3355.0*   Fasting Lipid Panel:  Lab 05/01/11 0600  CHOL 100  HDL 27*  LDLCALC 62  TRIG 54  CHOLHDL 3.7  LDLDIRECT --   Thyroid Function Tests:  Lab 04/27/11 1612  TSH 2.497  T4TOTAL --  FREET4 --  T3FREE --  THYROIDAB --    Micro Results: No results found for this or any previous visit (from the past 240 hour(s)). Studies/Results: X-ray Chest Pa And Lateral  05/01/2011  *RADIOLOGY REPORT*  Clinical Data: CHF exacerbation.  CHEST - 2  VIEW  Comparison: 07/13/2006  Findings: Cardiomegaly with central vascular congestion.  No focal consolidation, pleural effusion, or pneumothorax. Mild interstitial prominence.  No acute osseous abnormality.  IMPRESSION: Cardiomegaly with central vascular congestion. Mild interstitial prominence may reflect a mild edema pattern.  Original Report Authenticated By: Waneta Martins, M.D.   Medications: I have reviewed the patient's current medications. Scheduled Meds:   . a stronger pump book   Does not apply Once  . enoxaparin  40 mg Subcutaneous Q24H  . furosemide  80 mg Intravenous Q8H  . lisinopril  20 mg Oral Daily  . potassium chloride  40 mEq Oral Q4H  . potassium chloride  40 mEq Oral Daily  . potassium chloride  40 mEq Oral Once  . sodium chloride  3 mL Intravenous Q12H   Continuous Infusions:   . sodium chloride     PRN Meds:.acetaminophen, ondansetron (ZOFRAN) IV, sodium chloride  Assessment/Plan: # Acute on Chronic CHF Pt con't to diurese well, but still with significant edema in b/l LE.  Appreciate cardiology consult today, and will take their advice  regarding con't w/u for this pt. - appreciate cardiology input and help arranging further testing (cath, etc.) - lasix 80mg  IV TID - con't ACEi - supplement K - consult CHF service [phone]) for further w/u - Continue telemetry  - Continue Strict I.'s and O.'s with daily weights   # Hypokalemia: Con't repleting in the setting of IV lasix.  # Hyperbilirubinemia Bili elevated at 1.8 amidst other LFTs essentially normal.  Getting direct/indirect from this morning's bmet.  Will consisder w/u depending on results.  Given isolation in bili only, consider metabolism d/o (Gilbert's).  Will con't to trend. - hepatitis panel - HIV ab - abdominal u/s   # HTN - continue lisinopril and monitor BP. We will like blood pressure to be as low as possible as long as he is asymptomatic.   # HLD: Very well controlled without statin.  No need to start.   #DVT prophylaxis: lovenox    LOS: 2 days   [name], [name] 05/02/2011, 2:04 PM

## 2011-05-03 DIAGNOSIS — I5043 Acute on chronic combined systolic (congestive) and diastolic (congestive) heart failure: Secondary | ICD-10-CM

## 2011-05-03 DIAGNOSIS — I509 Heart failure, unspecified: Secondary | ICD-10-CM

## 2011-05-03 DIAGNOSIS — I10 Essential (primary) hypertension: Secondary | ICD-10-CM

## 2011-05-03 LAB — BASIC METABOLIC PANEL
BUN: 14 mg/dL (ref 6–23)
CO2: 26 mEq/L (ref 19–32)
Calcium: 9.3 mg/dL (ref 8.4–10.5)
Creatinine, Ser: 1.01 mg/dL (ref 0.50–1.35)
GFR calc Af Amer: >90 mL/min (ref >90)
GFR calc non Af Amer: 89 mL/min — ABNORMAL LOW (ref >90)
Glucose, Bld: 90 mg/dL (ref 70–99)
Glucose, Bld: 106 mg/dL — ABNORMAL HIGH (ref 70–99)
Potassium: 3.6 mEq/L (ref 3.5–5.1)
Sodium: 138 mEq/L (ref 135–145)

## 2011-05-03 LAB — CARBOXYHEMOGLOBIN
Carboxyhemoglobin: 0.9 % (ref 0.5–1.5)
Methemoglobin: 0.4 % (ref 0.0–1.5)
O2 Saturation: 52.1 %

## 2011-05-03 LAB — MAGNESIUM: Magnesium: 1.6 mg/dL (ref 1.5–2.5)

## 2011-05-03 LAB — HEPATIC FUNCTION PANEL
Albumin: 2.9 g/dL — ABNORMAL LOW (ref 3.5–5.2)
Bilirubin, Direct: 0.7 mg/dL — ABNORMAL HIGH (ref 0.0–0.3)
Indirect Bilirubin: 0.7 mg/dL (ref 0.3–0.9)
Total Bilirubin: 1.4 mg/dL — ABNORMAL HIGH (ref 0.3–1.2)

## 2011-05-03 MED ORDER — SODIUM CHLORIDE 0.9 % IJ SOLN: 10.0000 mL | INTRAMUSCULAR | Status: DC | PRN

## 2011-05-03 MED ORDER — DIGOXIN 250 MCG PO TABS
0.2500 mg | ORAL_TABLET | Freq: Every day | ORAL | Status: DC
  Administered 2011-05-03 – 2011-05-08 (×6): 0.25 mg via ORAL
  Filled 2011-05-03 (×6): qty 1

## 2011-05-03 MED ORDER — MILRINONE IN DEXTROSE 200-5 MCG/ML-% IV SOLN
0.1250 ug/kg/min | INTRAVENOUS | Status: DC
  Administered 2011-05-03 – 2011-05-06 (×7): 0.25 ug/kg/min via INTRAVENOUS
  Administered 2011-05-06: 0.125 ug/kg/min via INTRAVENOUS
  Filled 2011-05-03 (×8): qty 100

## 2011-05-03 MED ORDER — SPIRONOLACTONE 25 MG PO TABS
25.0000 mg | ORAL_TABLET | Freq: Every day | ORAL | Status: DC
  Administered 2011-05-03 – 2011-05-08 (×6): 25 mg via ORAL
  Filled 2011-05-03 (×6): qty 1

## 2011-05-03 NOTE — Progress Notes (Signed)
Heart Failure Service-Pharmacy  45 year old male admitted for lower extremity swelling due to volume overload. I have reviewed the heart failure education materials with the patient and his mother who was at the bedside. Although his mother is very concerned with his health, the patient himself does not seem very concerned with his disease and his only question at the end of our discussion was "when will I be able to go home".  Daily weights- patient does not report weighing himself daily pta and states that "his scale doesn't work like it should". I informed him that we may be able to provide him with a scale at discharge if he qualifies which he should. We reviewed the daily action plan which he showed little interest in.  Diet- It sounds as though he does not eat the proper foods for his condition and may eat a lot of can foods (ravioli), fried chicken tenders. There is question to how much alcohol the patient consumes that could be adding to volume overload.I also reviewed with him how to read a label and limiting salt intake to 2000mg .    I have consulted nutrition to come and speak with the patient about better eating options with heart failure.   Medications - We reviewed his current medications of furosemide and lisinopril. We discussed how important compliance is with his medications, and how other medications including spironolactone and beta blockers may be added to his regimen to help him live longer and better.   He does note that he was on toprol in the past-when asked about side effects he notes that they did improve after a few weeks but then it sounded as if he deteriorated overall with likely increased fluid and sob and then he felt it necessary to stop the metoprolol. A trial of cavedilol in the hosptial would likely be beneficial and this was discussed with him.  Exercise - he seems fairly immobile currently and mentions a torn muclse/ligament in his leg contributing to this. He  also notes his increasing sob with very limited activity. Appears most of his time is spent sitting up on the watching tv. He was laying down most of our discussion. We discussed the need for him to increase exercise as tolerated.  20 minutes spent in disussion. 05/03/2011 William Davila

## 2011-05-03 NOTE — Progress Notes (Signed)
Subjective: No events overnight, pt still having difficulty ambulating.  Objective: Vital signs in last 24 hours: Filed Vitals:   05/02/11 0500 05/02/11 1400 05/02/11 2100 05/03/11 0500  BP: 109/82 117/63 110/74 119/86  Pulse: 103 98 106 101  Temp: 97.6 F (36.4 C) 98.3 F (36.8 C) 98 F (36.7 C) 97.4 F (36.3 C)  TempSrc:  Oral Oral Oral  Resp: 18 20 20 18   Height:      Weight: 354 lb 0.9 oz (160.6 kg)   349 lb 6.9 oz (158.5 kg)  SpO2: 98% 98% 98% 99%   Weight change: -4 lb 10.1 oz (-2.1 kg)  Intake/Output Summary (Last 24 hours) at 05/03/11 1350 Last data filed at 05/03/11 1200  Gross per 24 hour  Intake    723 ml  Output   2905 ml  Net  -2182 ml   Physical Exam: GEN: NAD. Alert and oriented x 3. Pleasant, conversant, and cooperative to exam.  RESP: CTAB, no w/r/r  CARDIOVASCULAR: RRR, distant S1, S2  ABDOMEN: soft, NT/ND, NABS  EXT: warm and dry. Asymmetric pitting edema up to knee on L, much improved on the R  Lab Results: Basic Metabolic Panel:  Lab 05/03/11 7829 05/02/11 0616  NA 137 136  K 3.5 3.4*  CL 98 98  CO2 27 28  GLUCOSE 90 97  BUN 14 14  CREATININE 1.01 1.00  CALCIUM 9.3 9.6  MG 1.6 1.7  PHOS -- --   Liver Function Tests:  Lab 05/03/11 0551 05/02/11 0616 04/30/11 2004  AST 21 -- 27  ALT 18 -- 18  ALKPHOS 113 -- 125*  BILITOT 1.4* 1.5* --  PROT 7.4 -- 7.9  ALBUMIN 2.9* -- 3.2*   CBC:  Lab 04/30/11 2004 04/27/11 1612  WBC 10.0 10.7*  NEUTROABS 7.5 --  HGB 15.1 15.3  HCT 43.3 44.4  MCV 81.5 81.6  PLT 546* 580*   BNP:  Lab 05/01/11 0500  POCBNP 3355.0*   Fasting Lipid Panel:  Lab 05/01/11 0600  CHOL 100  HDL 27*  LDLCALC 62  TRIG 54  CHOLHDL 3.7  LDLDIRECT --   Thyroid Function Tests:  Lab 04/27/11 1612  TSH 2.497  T4TOTAL --  FREET4 --  T3FREE --  THYROIDAB --    Medications: I have reviewed the patient's current medications. Scheduled Meds:   . a stronger pump book   Does not apply Once  . enoxaparin  40  mg Subcutaneous Q24H  . furosemide  80 mg Intravenous Q8H  . lisinopril  20 mg Oral Daily  . potassium chloride  40 mEq Oral Daily  . potassium chloride  40 mEq Oral Once  . sodium chloride  3 mL Intravenous Q12H  . DISCONTD: furosemide  80 mg Intravenous Q8H  . DISCONTD: furosemide  80 mg Intravenous Q8H   Continuous Infusions:   . sodium chloride     PRN Meds:.acetaminophen, ondansetron (ZOFRAN) IV, sodium chloride  Assessment/Plan: # Acute on Chronic CHF  Pt con't to diurese, negative 5 L in total, but still with significant edema in L>R leg.  Cardiology and CHF services now contributing to care.  No formal plans for cath yet, but will await cardiology input. - appreciate cardiology input and help arranging further testing (cath, etc.)  - appreciate CHF service input - lasix 80mg  IV TID  - con't ACEi  - supplement K  - Continue telemetry  - Continue Strict I.'s and O.'s with daily weights   #Unilateral leg edema Concern for  DVT as R leg has decreased in size, but L has stayed swollen.  Pt does have L quad tendon compromise, but that should not account for con't edema and pain.  Also has OA, which the pt thinks makes his knee swell, but again, with the pt's sedentary behavior and low flow, DVT should be evaluated. - LE doppler  # Hypokalemia:  Con't repleting in the setting of IV lasix.   # Hyperbilirubinemia  Bili elevated at 1.8 amidst other LFTs essentially normal.  Fractionated split evenly.  Will con't to follow, but could be Gilbert's.  Trended down slightly today.  Holding on more studies, but could consider: - hepatitis panel  - HIV ab  - abdominal u/s   # HTN  - continue lisinopril and monitor BP. We will like blood pressure to be as low as possible as long as he is asymptomatic.   # HLD: Very well controlled without statin. No need to start.   #DVT prophylaxis: lovenox   LOS: 3 days   [name], [name] 05/03/2011, 1:50 PM

## 2011-05-03 NOTE — Progress Notes (Signed)
Nutrition Education Note  Nutrition dx:  Nutrition-related knowledge deficit r/t heart failure diet therapy AEB MD/nursing request Goal: Verbalize 3 ways to decrease sodium in diet. Goal met.  Pt with BMI of 48.5. Meets criteria for morbid obesity. Eating 100% of Heart Healthy diet. Admits to drinking "gallons" of water throughout the day and consumes many processed foods (ravioli, sausage, chicken tenders, etc)  Intervention:  Brief education; Living Better with Heart Failure Booklet reviewed, Heart Failure Nutrition Therapy Handout Provided.  Goals of nutrition therapy discussed.  Understanding confirmed.  ? compliance  Monitoring:  Knowledge; for questions.  Please consult RD if new questions present.  #[phone]   Pager:  [phone]

## 2011-05-03 NOTE — Progress Notes (Signed)
Utilization Review Complete.  William Davila 05/03/2011

## 2011-05-03 NOTE — Progress Notes (Signed)
Referring Physician: Rogelia Boga Primary Physician: Primary Cardiologist:none Reason for Consultation: Heart Failure   HPI: William Davila is a 45 y.o.male presently admitted complaining of progressive lower extremity edema. Cardiac history is reviewed below including long-standing dilated cardiomyopathy, presumably nonischemic in the setting of hypertension, and documented noncompliance with previous discharge from [facility] cardiology for noncompliance. He was admitted from the outpatient internal medicine teaching service clinic. He states he has a "fluid pill" that he takes intermittently, although has not been on any standing regimen for his cardiomyopathy.  He has had problems with his heart for the past 11 years. He has not been followed by cardiology for 2 years. He has noticed progressive dyspnea and lower extremity edema over the last 3-4 months. He has only been taking Lasix and Lisinopril at home. He has tried Lopressor in the past and stopped taking due to fatigue. Sleeps with 2 pillows. He tore L quadricep and requires a walker to ambulate. Unemployed over the last 4 years. He does not have money for medications but his family assists. He misses medications once or twice a month.    He is followed in [facility] and recently had an ECHO 11/16 which revealed ejection fraction was in the range of 10% to 20%. Doppler parameters are consistent with an irreversible restrictive pattern, indicative of decreased left ventricular diastolic compliance and/or increased left atrial pressure (grade 4 diastolic dysfunction). Mitral valve: Doppler: Mild to moderate regurgitation. The results of the ECHO prompted the admission. He has been receiving 80 mg Lasix IV every 8 hours with a 5 pound weight loss. Admit weight 157 kg and now 158 kg with minimal diuresis.     Review of Systems:     Cardiac Review of Systems: {Y] = yes [ ]  = no  Chest Pain [    ]  Resting SOB [  ] Exertional SOB  Cove.Etienne  ]  Orthopnea  Cove.Etienne  ]   Pedal Edema [ y  ]    Palpitations [  ] Syncope  [ ]    Presyncope [   ]  General Review of Systems: [Y] = yes [  ]=no Constitional: recent weight change Cove.Etienne  ]; anorexia [  ]; fatigue Cove.Etienne  ]; nausea [  ]; night sweats [  ]; fever [ ] ; or chills [n  ];                                                                                                                                          Dental: poor dentition[  ]; Last Dentist visit: never been to a dentist  Eye : blurred vision [  ]; diplopia [   ]; vision changes [  ];  Amaurosis fugax[  ]; Resp: cough [  y];  wheezing[  ];  hemoptysis[  ]; shortness of breath[y  ]; paroxysmal nocturnal dyspnea[  ]; dyspnea on exertion[y  ];  or orthopnea[ y ];  GI:  gallstones[  ], vomiting[  ];  dysphagia[  ]; melena[  ];  hematochezia [  ]; heartburn[  ];   Hx of  Colonoscopy[  ]; GU: kidney stones [n  ]; hematuria[  ];   dysuria [  ];  nocturia[ ] ;  history of     obstruction [  ];                 Skin: rash, swelling[  ];, hair loss[  ];  peripheral edema[  ];  or itching[  ]; Musculosketetal: myalgias[  ];  joint swelling[  ];  joint erythema[  ];  joint pain[  ];  back pain[  ];  Heme/Lymph: bruising[  ];  bleeding[  ];  anemia[  ];  Neuro: TIA[  ];  headaches[  ];  stroke[  ];  vertigo[  ];  seizures[  ];   paresthesias[  ];  difficulty walking[  ];  Psych:depression[  ]; anxiety[  ];  Endocrine: diabetes[  ];  thyroid dysfunction[  ];  Immunizations: Flu [  ]; Pneumococcal[  ];  Other:  Past Medical History  Diagnosis Date  . Chronic systolic heart failure     Diagnosed 2001; prior patient of Dr. Mendel Ryder with [facility] - discharged from practice  . Dilated cardiomyopathy     LVEF 10-20%  . Hyperlipidemia   . Essential hypertension, benign   . Morbid obesity     Medications Prior to Admission  Medication Dose Route Frequency Provider Last Rate Last Dose  . 0.9 %  sodium chloride infusion  250 mL Intravenous Continuous Nelda Bucks      .  a stronger pump book   Does not apply Once Rocco Serene      . acetaminophen (TYLENOL) tablet 650 mg  650 mg Oral Q4H PRN Kristin Mills   650 mg at 05/02/11 1028  . enoxaparin (LOVENOX) injection 40 mg  40 mg Subcutaneous Q24H Kristin Mills   40 mg at 05/02/11 2126  . furosemide (LASIX) 160 mg in dextrose 5 % 50 mL IVPB  160 mg Intravenous Once [employer]   160 mg at 04/30/11 1956  . furosemide (LASIX) injection 80 mg  80 mg Intravenous Q8H Emeline Gins, PHARMD   80 mg at 05/03/11 1410  . lisinopril (PRINIVIL,ZESTRIL) tablet 20 mg  20 mg Oral Daily Kristin Mills   20 mg at 05/03/11 1051  . ondansetron (ZOFRAN) injection 4 mg  4 mg Intravenous Q6H PRN Nelda Bucks      . potassium chloride SA (K-DUR,KLOR-CON) CR tablet 40 mEq  40 mEq Oral Q4H Christopher Pribula   40 mEq at 05/01/11 1953  . potassium chloride SA (K-DUR,KLOR-CON) CR tablet 40 mEq  40 mEq Oral Daily Jonelle Sidle, MD   40 mEq at 05/03/11 1045  . potassium chloride SA (K-DUR,KLOR-CON) CR tablet 40 mEq  40 mEq Oral Once [employer], PHARMD   40 mEq at 05/02/11 1351  . sodium chloride 0.9 % injection 3 mL  3 mL Intravenous Q12H Kristin Mills   3 mL at 05/03/11 1059  . sodium chloride 0.9 % injection 3 mL  3 mL Intravenous PRN Nelda Bucks      . DISCONTD: furosemide (LASIX) injection 80 mg  80 mg Intravenous Q8H Jonelle Sidle, MD   80 mg at 05/02/11 1352  . DISCONTD: furosemide (LASIX) injection 80 mg  80 mg Intravenous Q8H Mellody Drown  Reynolds, PHARMD       Medications Prior to Admission  Medication Sig Dispense Refill  . furosemide (LASIX) 80 MG tablet Take 1.5 tablets (120 mg total) by mouth 2 (two) times daily.  270 tablet  3  . HYDROcodone-acetaminophen (VICODIN) 5-500 MG per tablet Take 2 tablets by mouth every 6 (six) hours as needed for pain.  30 tablet  0  . lisinopril (PRINIVIL,ZESTRIL) 20 MG tablet Take 1 tablet (20 mg total) by mouth daily.  30 tablet  0  . potassium  chloride (K-DUR) 10 MEQ tablet Take 2 tablets (20 mEq total) by mouth 2 (two) times daily.  10 tablet  0        . a stronger pump book   Does not apply Once  . enoxaparin  40 mg Subcutaneous Q24H  . furosemide  80 mg Intravenous Q8H  . lisinopril  20 mg Oral Daily  . potassium chloride  40 mEq Oral Daily  . sodium chloride  3 mL Intravenous Q12H  . DISCONTD: furosemide  80 mg Intravenous Q8H  . DISCONTD: furosemide  80 mg Intravenous Q8H    Infusions:    . sodium chloride      Allergies  Allergen Reactions  . Beta Adrenergic Blockers     REACTION: fatigue and increased dyspnea with beta blockers.  . Bidil     REACTION: headache    History   Social History  . Marital Status: Single    Spouse Name: N/A    Number of Children: N/A  . Years of Education: N/A   Occupational History  . Not on file.   Social History Main Topics  . Smoking status: Never Smoker   . Smokeless tobacco: Never Used  . Alcohol Use: Yes     Beer and liquor on the weekends  . Drug Use: No  . Sexually Active: No   Other Topics Concern  . Not on file   Social History Narrative  . No narrative on file    Family History  Problem Relation Age of Onset  . Hypertension Mother     PHYSICAL EXAM: Filed Vitals:   05/03/11 1400  BP: 108/79  Pulse: 109  Temp: 98 F (36.7 C)  Resp: 18     Intake/Output Summary (Last 24 hours) at 05/03/11 1444 Last data filed at 05/03/11 1200  Gross per 24 hour  Intake    723 ml  Output   2905 ml  Net  -2182 ml    General:  uncomfortable appearing. No respiratory difficulty HEENT: normal Neck: supple.  JVD to jaw. Carotids 2+ bilat; no bruits. No lymphadenopathy or thryomegaly appreciated. Cor: PMI non-palpable. Regular rate & rhythm. No rubs. prominent S3  Gallops. 2/6 TR Lungs: clear Abdomen: soft, obese nontender, distended. No hepatosplenomegaly. No bruits or masses. Good bowel sounds. Extremities: no cyanosis, clubbing, rash, 3+  edema Neuro: alert & oriented x 3, cranial nerves grossly intact. moves all 4 extremities w/o difficulty. Affect pleasant.  ECG: Sinus Tach rate 120s   Results for orders placed during the hospital encounter of 04/30/11 (from the past 24 hour(s))  BASIC METABOLIC PANEL     Status: Abnormal   Collection Time   05/03/11  5:51 AM      Component Value Range   Sodium 137  135 - 145 (mEq/L)   Potassium 3.5  3.5 - 5.1 (mEq/L)   Chloride 98  96 - 112 (mEq/L)   CO2 27  19 - 32 (mEq/L)  Glucose, Bld 90  70 - 99 (mg/dL)   BUN 14  6 - 23 (mg/dL)   Creatinine, Ser 1.61  0.50 - 1.35 (mg/dL)   Calcium 9.3  8.4 - 09.6 (mg/dL)   GFR calc non Af Amer 89 (*) >90 (mL/min)   GFR calc Af Amer >90  >90 (mL/min)  HEPATIC FUNCTION PANEL     Status: Abnormal   Collection Time   05/03/11  5:51 AM      Component Value Range   Total Protein 7.4  6.0 - 8.3 (g/dL)   Albumin 2.9 (*) 3.5 - 5.2 (g/dL)   AST 21  0 - 37 (U/L)   ALT 18  0 - 53 (U/L)   Alkaline Phosphatase 113  39 - 117 (U/L)   Total Bilirubin 1.4 (*) 0.3 - 1.2 (mg/dL)   Bilirubin, Direct 0.7 (*) 0.0 - 0.3 (mg/dL)   Indirect Bilirubin 0.7  0.3 - 0.9 (mg/dL)  MAGNESIUM     Status: Normal   Collection Time   05/03/11  5:51 AM      Component Value Range   Magnesium 1.6  1.5 - 2.5 (mg/dL)   No results found.   ASSESSMENT 1. 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 Dilated cardiomyopathy, likely nonischemic 3. History of beta-blocker intolerance due to fatigue and nitrates due to headache 4. Morbid obesity 5. Regular alcohol quit 4 months ago.  6. Tachycardia 7. Medication noncompliance due to financial status 8. Hypomagnesemia   PLAN/DISCUSSION: 45 year old AA male admitted with chronic combined heart failure. Massive volume overload with poor response to IV Lasix. Transfer to step down for inotropes and PICC. Will obtain CO-OX after PICC inserted and  start Milrinone. Will consult SW for assistance with medications and disability. He will need close follow up in the heart failure clinic upon discharge.   ATTG: Patient seen and examined with Tonye Becket, NP. We discussed all aspects of the encounter. I agree with the assessment and plan as stated above.  Pt with severe decompensated HF with probable low-output/shock - 40 pound weight gain. Transfer stepdown. Place PICC to monitor CVP and co-ox. Start milrinone. No b-blocker at this time due to acute decompensation. Will need close f/u after discharge and SW to follow to help with social issues.

## 2011-05-04 DIAGNOSIS — I5023 Acute on chronic systolic (congestive) heart failure: Secondary | ICD-10-CM

## 2011-05-04 LAB — BASIC METABOLIC PANEL
BUN: 14 mg/dL (ref 6–23)
Chloride: 100 mEq/L (ref 96–112)
GFR calc Af Amer: >90 mL/min (ref >90)
Glucose, Bld: 103 mg/dL — ABNORMAL HIGH (ref 70–99)
Potassium: 3.1 mEq/L — ABNORMAL LOW (ref 3.5–5.1)

## 2011-05-04 LAB — CARBOXYHEMOGLOBIN
Carboxyhemoglobin: 1.1 % (ref 0.5–1.5)
O2 Saturation: 63.7 %

## 2011-05-04 MED ORDER — POTASSIUM CHLORIDE CRYS ER 20 MEQ PO TBCR
40.0000 meq | EXTENDED_RELEASE_TABLET | Freq: Once | ORAL | Status: AC
  Administered 2011-05-04: 40 meq via ORAL

## 2011-05-04 MED ORDER — POTASSIUM CHLORIDE CRYS ER 20 MEQ PO TBCR
20.0000 meq | EXTENDED_RELEASE_TABLET | Freq: Two times a day (BID) | ORAL | Status: DC
  Administered 2011-05-04 – 2011-05-07 (×9): 20 meq via ORAL
  Filled 2011-05-04 (×15): qty 1

## 2011-05-04 MED ORDER — ENOXAPARIN SODIUM 80 MG/0.8ML ~~LOC~~ SOLN
70.0000 mg | SUBCUTANEOUS | Status: DC
  Administered 2011-05-04 – 2011-05-07 (×4): 70 mg via SUBCUTANEOUS
  Filled 2011-05-04 (×5): qty 0.8

## 2011-05-04 MED ORDER — MAGNESIUM SULFATE 40 MG/ML IJ SOLN
2.0000 g | Freq: Once | INTRAMUSCULAR | Status: AC
  Administered 2011-05-04: 2 g via INTRAVENOUS
  Filled 2011-05-04: qty 50

## 2011-05-04 MED ORDER — LISINOPRIL 20 MG PO TABS
20.0000 mg | ORAL_TABLET | Freq: Two times a day (BID) | ORAL | Status: DC
  Administered 2011-05-04 – 2011-05-08 (×9): 20 mg via ORAL
  Filled 2011-05-04 (×10): qty 1

## 2011-05-04 NOTE — Progress Notes (Signed)
Pt had a 5 beat run of vtach and was asymptomatic at the time. Will continue to monitor. Natividad Brood 05/04/2011 11:22 PM

## 2011-05-04 NOTE — Progress Notes (Addendum)
Subjective:  [name] is a 45 y.o.male with morbid obesity and long h/o CHF with EF 10-15% admitted with cardiogenic shock and marked volume overload. Started on milrinone 11/19. Admission weight 157.3  Transferred to stepdown yesterday for inotropes and closer monitoring.  PICC inserted and CO-OX 52.1.  CO 4.22 CI 1.52. (pre-milrinone). Co-ox 63% this am CO 5.0/CI 2.1   Digoxin/Sprironolactone started as well. He continues on Lasix 80 mg IV every  8 hours. I/O - 5L  Feels ok. Denies SOB/PND. Weight decreased 3 pounds over night.  Continues to have lower extremity edema.    Intake/Output Summary (Last 24 hours) at 05/04/11 0809 Last data filed at 05/04/11 0700  Gross per 24 hour  Intake 324.75 ml  Output   3855 ml  Net -3530.25 ml    Current meds:    . digoxin  0.25 mg Oral Daily  . enoxaparin  40 mg Subcutaneous Q24H  . furosemide  80 mg Intravenous Q8H  . lisinopril  20 mg Oral Daily  . magnesium sulfate IVPB  2 g Intravenous Once  . potassium chloride  20 mEq Oral BID  . sodium chloride  3 mL Intravenous Q12H  . spironolactone  25 mg Oral Daily  . DISCONTD: a stronger pump book   Does not apply Once  . DISCONTD: potassium chloride  40 mEq Oral Daily   Infusions:    . sodium chloride 250 mL (05/04/11 0700)  . milrinone 0.25 mcg/kg/min (05/04/11 0700)     Objective:  Blood pressure 95/61, pulse 98, temperature 98.4 F (36.9 C), temperature source Oral, resp. rate 23, height 5\' 11"  (1.803 m), weight 152.9 kg (337 lb 1.3 oz), SpO2 99.00%. Weight change: -4.2 kg (-9 lb 4.2 oz)  Physical Exam: General:  Well appearing. No resp difficulty HEENT: normal Neck: supple. JVP to jaw. Carotids 2+ bilat; no bruits. No lymphadenopathy or thryomegaly appreciated. Cor: PMI nondisplaced. Regular rate & rhythm. No rubs, S3  or murmurs. Lungs: clear Abdomen: soft, nontender, nondistended. No hepatosplenomegaly. No bruits or masses. Good bowel sounds. Extremities: no cyanosis,  clubbing, rash,  3+edema Neuro: alert & orientedx3, cranial nerves grossly intact. moves all 4 extremities w/o difficulty. Affect pleasant  Telemetry Sinus Tach 100-105    Lab Results: Basic Metabolic Panel:  Lab 05/04/11 1610 05/03/11 1630 05/03/11 0551 05/02/11 0616 05/01/11 1600  NA 137 138 137 136 135  K 3.1* 3.6 -- -- --  CL 100 100 98 98 96  CO2 28 26 27 28 28   GLUCOSE 103* 106* 90 97 89  BUN 14 15 14 14 14   CREATININE 0.89 0.90 1.01 1.00 1.01  CALCIUM 9.0 9.2 9.3 9.6 9.2  MG -- -- 1.6 1.7 --  PHOS -- -- -- -- --   Liver Function Tests:  Lab 05/03/11 0551 05/02/11 0616 04/30/11 2004  AST 21 -- 27  ALT 18 -- 18  ALKPHOS 113 -- 125*  BILITOT 1.4* 1.5* 1.8*  PROT 7.4 -- 7.9  ALBUMIN 2.9* -- 3.2*   No results found for this basename: LIPASE:5,AMYLASE:5 in the last 168 hours No results found for this basename: AMMONIA:5 in the last 168 hours CBC:  Lab 04/30/11 2004 04/27/11 1612  WBC 10.0 10.7*  NEUTROABS 7.5 --  HGB 15.1 15.3  HCT 43.3 44.4  MCV 81.5 81.6  PLT 546* 580*   Cardiac Enzymes: No results found for this basename: CKTOTAL:5,CKMB:5,CKMBINDEX:5,TROPONINI:5 in the last 168 hours BNP:  Lab 05/01/11 0500  POCBNP 3355.0*   CBG: No  results found for this basename: GLUCAP:5 in the last 168 hours Microbiology: No results found for this basename: cult   No results found for this basename: CULT:2,SDES:2 in the last 168 hours  Imaging: No results found.   ASSESSMENT:   1. 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 Dilated cardiomyopathy, likely nonischemic  3. History of beta-blocker intolerance due to fatigue and nitrates due to headache  4. Morbid obesity  5. Regular alcohol quit 4 months ago.  6. Tachycardia  7. Medication noncompliance due to financial status  8. Hypomagnesemia  9. Hypokalemia   PLAN/DISCUSSION:  He remains markedly volume  overloaded. Cardiac output with milrinone.Will continue. Use milrinone to support diuresis and to titrate ACE-I and b-blocker as tolerated.   Repeat CO-OX this am. Potassium replaced per primary team and he continues on Spironolactone. Continue lasix 80 mg IV every 8 hours. Will ask SW to evaluated.  Re-check magnesium .   LOS: 4 days    CLEGG,AMY, NP 05/04/2011, 8:09 AM  Patient seen and examined with Tonye Becket, NP. We discussed all aspects of the encounter. I agree with the assessment and plan as stated above.  Long talk with patient and his cousin about the seriousness of his HF and about the need for compliance and possible consideration of advanced therapies. Continue milrinone to support titration of other meds.   Time spent 45 mins with over 50% of that time dedicated to counseling and discussions described above.   Marsden Zaino BensimhonMD 9:47 AM

## 2011-05-04 NOTE — Progress Notes (Signed)
Internal Medicine Teaching Service Attending Note Date: 05/04/2011  Patient name: William Davila  Medical record number: [id number]  Date of birth: [birth year]    This patient has been seen and discussed with the house staff. Please see their note for complete details. I concur with their findings. Dr Milas Kocher was at bedside and updated [place] on his medical plan.  Pt has systolic and restrictive diastolic heart failure, presumed non ischemic. The CHF team has started milrinone, dig, aldactone and will follow his response.   Care mgmt has been updated on pts efforts to get disability.    BUTCHER,ELIZABETH 05/04/2011, 11:48 AM

## 2011-05-04 NOTE — Progress Notes (Signed)
Subjective: One episode of Vtach overnight, asymptommatic, got 2g mag per cardiology on call.  Pt feels at baseline, no CP/SOB/palpitations.  Transferred to stepdown yesterday for invasive monitoring and inotropic therapy.  Objective: Vital signs in last 24 hours: Filed Vitals:   05/04/11 0808 05/04/11 0900 05/04/11 1000 05/04/11 1100  BP:      Pulse:  102 104 102  Temp: 98.5 F (36.9 C)     TempSrc: Oral     Resp:  23 14 18   Height:      Weight:      SpO2:  100% 100% 100%   Weight change: -9 lb 4.2 oz (-4.2 kg)  Intake/Output Summary (Last 24 hours) at 05/04/11 1120 Last data filed at 05/04/11 0800  Gross per 24 hour  Intake 338.65 ml  Output   3680 ml  Net -3341.35 ml   Physical Exam: GEN: NAD. Alert and oriented x 3. Pleasant, conversant, and cooperative to exam.  RESP: CTAB, no w/r/r  CARDIOVASCULAR: RRR, distant S1, S2, ABDOMEN: soft, NT/ND, NABS  EXT: warm and dry. Asymmetric pitting edema up to knee on L, much improved on the R  Lab Results: Basic Metabolic Panel:  Lab 05/04/11 1610 05/03/11 1630 05/03/11 0551 05/02/11 0616  NA 137 138 -- --  K 3.1* 3.6 -- --  CL 100 100 -- --  CO2 28 26 -- --  GLUCOSE 103* 106* -- --  BUN 14 15 -- --  CREATININE 0.89 0.90 -- --  CALCIUM 9.0 9.2 -- --  MG -- -- 1.6 1.7  PHOS -- -- -- --   Liver Function Tests:  Lab 05/03/11 0551 05/02/11 0616 04/30/11 2004  AST 21 -- 27  ALT 18 -- 18  ALKPHOS 113 -- 125*  BILITOT 1.4* 1.5* --  PROT 7.4 -- 7.9  ALBUMIN 2.9* -- 3.2*   No results found for this basename: LIPASE:2,AMYLASE:2 in the last 168 hours No results found for this basename: AMMONIA:2 in the last 168 hours CBC:  Lab 04/30/11 2004 04/27/11 1612  WBC 10.0 10.7*  NEUTROABS 7.5 --  HGB 15.1 15.3  HCT 43.3 44.4  MCV 81.5 81.6  PLT 546* 580*   Cardiac Enzymes: No results found for this basename: CKTOTAL:3,CKMB:3,CKMBINDEX:3,TROPONINI:3 in the last 168 hours BNP:  Lab 05/01/11 0500  POCBNP 3355.0*    D-Dimer: No results found for this basename: DDIMER:2 in the last 168 hours CBG: No results found for this basename: GLUCAP:6 in the last 168 hours Hemoglobin A1C: No results found for this basename: HGBA1C in the last 168 hours Fasting Lipid Panel:  Lab 05/01/11 0600  CHOL 100  HDL 27*  LDLCALC 62  TRIG 54  CHOLHDL 3.7  LDLDIRECT --   Thyroid Function Tests:  Lab 04/27/11 1612  TSH 2.497  T4TOTAL --  FREET4 --  T3FREE --  THYROIDAB --    Micro Results: No results found for this or any previous visit (from the past 240 hour(s)). Studies/Results: No results found. Medications: I have reviewed the patient's current medications. Scheduled Meds:   . digoxin  0.25 mg Oral Daily  . enoxaparin  70 mg Subcutaneous Q24H  . furosemide  80 mg Intravenous Q8H  . lisinopril  20 mg Oral BID  . magnesium sulfate IVPB  2 g Intravenous Once  . potassium chloride  20 mEq Oral BID  . potassium chloride  40 mEq Oral Once  . sodium chloride  3 mL Intravenous Q12H  . spironolactone  25 mg Oral Daily  .  DISCONTD: a stronger pump book   Does not apply Once  . DISCONTD: enoxaparin  40 mg Subcutaneous Q24H  . DISCONTD: lisinopril  20 mg Oral Daily  . DISCONTD: potassium chloride  40 mEq Oral Daily   Continuous Infusions:   . sodium chloride 250 mL (05/04/11 0800)  . milrinone 0.25 mcg/kg/min (05/04/11 0800)   PRN Meds:.acetaminophen, ondansetron (ZOFRAN) IV, sodium chloride, sodium chloride  Assessment/Plan: # Acute on Chronic Dilated Cardiomyopathy c CHF  Pt transferred to stepdown yesterday for invasive monitoring, inotrope tx, and titration of meds.  Diuresing well, but still lots of fluid in legs. Per CHF management and consultation, will con't to monitor and titrate meds.  Please see their note for full details.Pt con't to diurese, negative 5 L in total, but still with significant edema in L>R leg. Cardiology and CHF services now contributing to care. No formal plans for cath  yet, but will await cardiology input.  - appreciate CHF service input/co-management - digoxin 0.25 qd - lasix 80mg  tid - lisinopril 20mg  bid - spironolactone 25mg  qd - trend CI - milrinone infusion  #Unilateral leg edema  Concern for DVT as R leg has decreased in size, but L has stayed swollen. Pt does have L quad tendon compromise, but that should not account for con't edema and pain. Also has OA, which the pt thinks makes his knee swell, but again, with the pt's sedentary behavior and low flow, DVT should be evaluated.  - LE doppler  - PT evaluation  # Hypokalemia:  Con't repleting in the setting of IV lasix. Gave extra dose this morning given 3.1.  May need to increase standing dose.  Checking mag again tomorrow morning. - repletion - check mag  # Hyperbilirubinemia  Bili elevated at 1.8 amidst other LFTs essentially normal. Fractionated split evenly. Will con't to follow, but could be Gilbert's. Trended down slightly today. Holding on more studies, but could consider:  - hepatitis panel  - HIV ab  - abdominal u/s   # HTN  - continue lisinopril and monitor BP. We will like blood pressure to be as low as possible as long as he is asymptomatic.   # HLD: Very well controlled without statin. No need to start.   #DVT prophylaxis: lovenox   LOS: 4 days   [name], [name] 05/04/2011, 11:20 AM

## 2011-05-05 DIAGNOSIS — R609 Edema, unspecified: Secondary | ICD-10-CM

## 2011-05-05 DIAGNOSIS — I5023 Acute on chronic systolic (congestive) heart failure: Secondary | ICD-10-CM

## 2011-05-05 LAB — BASIC METABOLIC PANEL
BUN: 11 mg/dL (ref 6–23)
Chloride: 99 mEq/L (ref 96–112)
GFR calc Af Amer: >90 mL/min (ref >90)
Potassium: 3.6 mEq/L (ref 3.5–5.1)
Sodium: 135 mEq/L (ref 135–145)

## 2011-05-05 LAB — CARBOXYHEMOGLOBIN: Carboxyhemoglobin: 1.4 % (ref 0.5–1.5)

## 2011-05-05 MED ORDER — GUAIFENESIN-DM 100-10 MG/5ML PO SYRP
5.0000 mL | ORAL_SOLUTION | ORAL | Status: DC | PRN
  Administered 2011-05-05: 5 mL via ORAL
  Filled 2011-05-05: qty 5

## 2011-05-05 MED ORDER — POTASSIUM CHLORIDE CRYS ER 20 MEQ PO TBCR
40.0000 meq | EXTENDED_RELEASE_TABLET | Freq: Once | ORAL | Status: AC
  Administered 2011-05-05: 40 meq via ORAL

## 2011-05-05 MED ORDER — MAGNESIUM SULFATE 40 MG/ML IJ SOLN
2.0000 g | Freq: Once | INTRAMUSCULAR | Status: AC
  Administered 2011-05-05: 2 g via INTRAVENOUS
  Filled 2011-05-05: qty 50

## 2011-05-05 NOTE — Progress Notes (Signed)
Physical Therapy Evaluation Patient Details Name: William Davila MRN: [id number] DOB: 22-Jan-1966 Today's Date: 05/05/2011  Problem List:  Patient Active Problem List  Diagnoses  . HYPERLIPIDEMIA  . MORBID OBESITY  . CARDIOMYOPATHY, DILATED  . HYPERTENSION NEC  . Gout attack  . Knee pain, left  . Quadriceps muscle rupture  . Atrial fibrillation with RVR  . Acute on chronic systolic heart failure    Past Medical History:  Past Medical History  Diagnosis Date  . Chronic systolic heart failure     Diagnosed 2001; prior patient of Dr. Mendel Ryder with [facility] - discharged from practice  . Dilated cardiomyopathy     LVEF 10-20%  . Hyperlipidemia   . Essential hypertension, benign   . Morbid obesity    Past Surgical History:  Past Surgical History  Procedure Date  . No past surgeries     PT Assessment/Plan/Recommendation PT Assessment Clinical Impression Statement: Pt presented to therapy with decreased endurance and pain.  Pt would benefit from PT in the acute setting to maximize independence and functional mobility to prepare for safe d/c home. PT Recommendation/Assessment: Patient will need skilled PT in the acute care venue PT Problem List: Decreased strength;Decreased activity tolerance;Decreased balance;Decreased mobility;Decreased knowledge of use of DME;Decreased safety awareness;Cardiopulmonary status limiting activity Barriers to Discharge: None PT Therapy Diagnosis : Difficulty walking;Abnormality of gait;Generalized weakness;Acute pain PT Plan PT Frequency: Min 3X/week PT Treatment/Interventions: DME instruction;Gait training;Stair training;Functional mobility training;Therapeutic exercise;Balance training;Patient/family education PT Recommendation Follow Up Recommendations: Home health PT Equipment Recommended: Defer to next venue PT Goals  Acute Rehab PT Goals PT Goal Formulation: With patient Time For Goal Achievement: 7 days Pt will Roll Supine to Right Side:  with modified independence PT Goal: Rolling Supine to Right Side - Progress: Progressing toward goal Pt will Roll Supine to Left Side: with modified independence PT Goal: Rolling Supine to Left Side - Progress: Progressing toward goal Pt will go Supine/Side to Sit: with modified independence PT Goal: Supine/Side to Sit - Progress: Progressing toward goal Pt will go Sit to Supine/Side: with modified independence PT Goal: Sit to Supine/Side - Progress: Progressing toward goal Pt will Transfer Sit to Stand/Stand to Sit: with modified independence;with upper extremity assist PT Transfer Goal: Sit to Stand/Stand to Sit - Progress: Progressing toward goal Pt will Transfer Bed to Chair/Chair to Bed: with modified independence PT Transfer Goal: Bed to Chair/Chair to Bed - Progress: Progressing toward goal Pt will Ambulate: >150 feet;with modified independence;with least restrictive assistive device PT Goal: Ambulate - Progress: Progressing toward goal Pt will Go Up / Down Stairs: 1-2 stairs;with modified independence;with least restrictive assistive device PT Goal: Up/Down Stairs - Progress: Not met  PT Evaluation Precautions/Restrictions  Precautions Precautions: Fall Required Braces or Orthoses: No Restrictions Weight Bearing Restrictions: No Prior Functioning  Home Living Lives With: Family (Grandparents) Receives Help From: Family Type of Home: House Home Layout: One level Home Access: Stairs to enter Entrance Stairs-Rails: None Secretary/administrator of Steps: 1 Bathroom Shower/Tub: Engineer, manufacturing systems: Standard Home Adaptive Equipment: Paediatric nurse with back;Crutches;Walker - rolling Prior Function Level of Independence: Independent with basic ADLs;Requires assistive device for independence Able to Take Stairs?: Yes Driving: No Vocation: Unemployed Cognition Cognition Arousal/Alertness: Awake/alert Overall Cognitive Status: Appears within functional limits for  tasks assessed Sensation/Coordination   Extremity Assessment RUE Assessment RUE Assessment: Within Functional Limits LUE Assessment LUE Assessment: Within Functional Limits RLE Assessment RLE Assessment: Within Functional Limits LLE Assessment LLE Assessment: Within Functional Limits Mobility (including  Balance) Bed Mobility Bed Mobility: Yes Rolling Right: 5: Supervision Rolling Right Details (indicate cue type and reason): Supervision for pt safety Right Sidelying to Sit: 5: Supervision Right Sidelying to Sit Details (indicate cue type and reason): Supervision for pt safety.  Verbal cues for UE placement. Sitting - Scoot to Edge of Bed: 5: Supervision Sitting - Scoot to [place] of Bed Details (indicate cue type and reason): Supervision for pt safety Sit to Supine - Left: 4: Min assist Sit to Supine - Left Details (indicate cue type and reason): Min A to move LE.  Verbal cues to initiate. Transfers Transfers: Yes Sit to Stand: 5: Supervision;From elevated surface;With upper extremity assist;From bed;From chair/3-in-1 Sit to Stand Details (indicate cue type and reason): Supervision for pt safety.  Pt required bed to be elevated prior to standing.  Verbal cues to use UE and RW. Stand to Sit: 5: Supervision;With upper extremity assist;With armrests;To bed;To chair/3-in-1 Stand to Sit Details: Supervision for pt safety. Stand Pivot Transfers: 4: Min assist (From bed to 3-in-1) Stand Pivot Transfer Details (indicate cue type and reason): Min A to move 3-in-1 behind him.  A for pt safety. Ambulation/Gait Ambulation/Gait: Yes Ambulation/Gait Assistance: 4: Min assist Ambulation/Gait Assistance Details (indicate cue type and reason): Min A for pt balance and safety.  A to mobilize lines and leads.  Cue for proper RW use. Ambulation Distance (Feet): 10 Feet Assistive device: Rolling walker Gait Pattern: Decreased stride length;Shuffle Gait velocity: Decreased, but not measured due to decr  ambulation distance Stairs: No Wheelchair Mobility Wheelchair Mobility: No  Posture/Postural Control Posture/Postural Control: No significant limitations Balance Balance Assessed: No  Vitals: BP - 90/37 HR - 111 MAP - 49  Exercise    End of Session PT - End of Session Equipment Utilized During Treatment: Gait belt;Other (comment) (RW) Activity Tolerance: Patient limited by fatigue;Patient limited by pain Patient left: in bed;with call bell in reach;with family/visitor present Nurse Communication: Mobility status for transfers;Mobility status for ambulation (Consulted c nurse if PT was appropriate today, she approved) General Behavior During Session: [facility] for tasks performed Cognition: [facility] for tasks performed  Lacinda Axon 05/05/2011, 10:41 AM  Jake Shark, PT DPT [phone]

## 2011-05-05 NOTE — Progress Notes (Signed)
Subjective: No events overnight, pt has no c/o.  DVT study prelim negative.  Objective: Vital signs in last 24 hours: Filed Vitals:   05/05/11 0800 05/05/11 0940 05/05/11 1105 05/05/11 1200  BP:  90/37 99/57   Pulse: 110 111 109   Temp: 98.2 F (36.8 C)   98.2 F (36.8 C)  TempSrc: Oral   Oral  Resp: 19  14   Height:      Weight:      SpO2: 98%  100%    Weight change: -5 lb 1.1 oz (-2.3 kg)  Intake/Output Summary (Last 24 hours) at 05/05/11 1350 Last data filed at 05/05/11 1300  Gross per 24 hour  Intake 2315.6 ml  Output   4726 ml  Net -2410.4 ml   Physical Exam: GEN: NAD. Alert and oriented x 3. Pleasant, conversant, and cooperative to exam.  RESP: CTAB, no w/r/r  CARDIOVASCULAR: RRR, distant S1, S2 ABDOMEN: soft, NT/ND, NABS  EXT: warm and dry. Edema much improved b/l.  Lab Results: Basic Metabolic Panel:  Lab 05/05/11 1610 05/04/11 0445 05/03/11 0551  NA 135 137 --  K 3.6 3.1* --  CL 99 100 --  CO2 29 28 --  GLUCOSE 118* 103* --  BUN 11 14 --  CREATININE 0.94 0.89 --  CALCIUM 9.1 9.0 --  MG 1.7 -- 1.6  PHOS -- -- --   Liver Function Tests:  Lab 05/03/11 0551 05/02/11 0616 04/30/11 2004  AST 21 -- 27  ALT 18 -- 18  ALKPHOS 113 -- 125*  BILITOT 1.4* 1.5* --  PROT 7.4 -- 7.9  ALBUMIN 2.9* -- 3.2*   CBC:  Lab 04/30/11 2004  WBC 10.0  NEUTROABS 7.5  HGB 15.1  HCT 43.3  MCV 81.5  PLT 546*   Cardiac Enzymes: No results found for this basename: CKTOTAL:3,CKMB:3,CKMBINDEX:3,TROPONINI:3 in the last 168 hours BNP:  Lab 05/01/11 0500  POCBNP 3355.0*   Fasting Lipid Panel:  Lab 05/01/11 0600  CHOL 100  HDL 27*  LDLCALC 62  TRIG 54  CHOLHDL 3.7  LDLDIRECT --     Micro Results: Recent Results (from the past 240 hour(s))  MRSA PCR SCREENING     Status: Normal   Collection Time   05/05/11 10:28 AM      Component Value Range Status Comment   MRSA by PCR NEGATIVE  NEGATIVE  Final    Studies/Results: No results found. Medications: I  have reviewed the patient's current medications. Scheduled Meds:   . digoxin  0.25 mg Oral Daily  . enoxaparin  70 mg Subcutaneous Q24H  . furosemide  80 mg Intravenous Q8H  . lisinopril  20 mg Oral BID  . magnesium sulfate IVPB  2 g Intravenous Once  . potassium chloride  20 mEq Oral BID  . potassium chloride  40 mEq Oral Once  . spironolactone  25 mg Oral Daily  . DISCONTD: sodium chloride  3 mL Intravenous Q12H   Continuous Infusions:   . sodium chloride 250 mL (05/05/11 1300)  . milrinone 0.25 mcg/kg/min (05/05/11 1300)   PRN Meds:.acetaminophen, guaiFENesin-dextromethorphan, ondansetron (ZOFRAN) IV, sodium chloride, DISCONTD: sodium chloride Assessment/Plan: # Acute on Chronic Dilated Cardiomyopathy c CHF  Pt con't on milrinone, diuresing well.  Appreciate guidance from CHF team for management decisions. - appreciate CHF service input/co-management  - digoxin 0.25 qd  - lasix 80mg  tid  - lisinopril 20mg  bid  - spironolactone 25mg  qd  - trend CI  - milrinone infusion - considering coreg  #Unilateral  leg edema  Unilateral nature of edema improved, DVT prelim negative for DVT. - PT evaluation   # Hypokalemia:  Con't repleting in the setting of IV lasix. Gave extra dose this morning. Also repleting mag. - repletion  - check mag   # Hyperbilirubinemia  Bili elevated at 1.8 amidst other LFTs essentially normal. Fractionated split evenly. Will con't to follow, but could be Gilbert's. Trended down slightly today. Holding on more studies, but could consider:  - hepatitis panel  - HIV ab  - abdominal u/s   # HTN  - continue lisinopril and monitor BP. We will like blood pressure to be as low as possible as long as he is asymptomatic.   # HLD: Very well controlled without statin. No need to start.   #DVT prophylaxis: lovenox    LOS: 5 days   [name], [name] 05/05/2011, 1:50 PM

## 2011-05-05 NOTE — Progress Notes (Signed)
*  PRELIMINARY RESULTS*  Bilateral Lower Extremity Venous Duplex has been performed. No obvious DVT seen in either leg.  Farrel Demark 05/05/2011, 10:50 AM

## 2011-05-05 NOTE — Progress Notes (Signed)
Subjective:  [name] is a 45 y.o.male with morbid obesity and long h/o CHF with EF 10-15% admitted with cardiogenic shock and marked volume overload. Started on milrinone 11/19. Admission weight 157.3  Day #2  Milrinone 0.25 mcg/kg/min via PICC. Ace inhbitor increased yesterday.  Continues on Lasix 80mg  IV every 8 hours. -1862. CVP decreased.  Weight 152 kg   Feels ok. Complains of a cough. (Ace increased yesterday) Denies SOB/PND/ Orthopnea. Decreased lower extremity edema      Intake/Output Summary (Last 24 hours) at 05/05/11 0720 Last data filed at 05/05/11 0600  Gross per 24 hour  Intake 1838.7 ml  Output   3701 ml  Net -1862.3 ml    Current meds:    . digoxin  0.25 mg Oral Daily  . enoxaparin  70 mg Subcutaneous Q24H  . furosemide  80 mg Intravenous Q8H  . lisinopril  20 mg Oral BID  . potassium chloride  20 mEq Oral BID  . potassium chloride  40 mEq Oral Once  . spironolactone  25 mg Oral Daily  . DISCONTD: enoxaparin  40 mg Subcutaneous Q24H  . DISCONTD: lisinopril  20 mg Oral Daily  . DISCONTD: sodium chloride  3 mL Intravenous Q12H   Infusions:    . sodium chloride 250 mL (05/05/11 0600)  . milrinone 0.25 mcg/kg/min (05/05/11 0600)     Objective:  Blood pressure 106/47, pulse 115, temperature 98.4 F (36.9 C), temperature source Oral, resp. rate 24, height 5\' 11"  (1.803 m), weight 152 kg (335 lb 1.6 oz), SpO2 100.00%. Weight change: -2.3 kg (-5 lb 1.1 oz) CVP 10 Physical Exam: General:  Well appearing. No resp difficulty HEENT: normal Neck: supple. JVP 8-9 Carotids 2+ bilat; no bruits. No lymphadenopathy or thryomegaly appreciated. Cor: PMI nondisplaced. Regular rate & rhythm. No rubs, S3 gallops or murmurs. Lungs: decreased in bases Abdomen: soft, nontender, nondistended. No hepatosplenomegaly. No bruits or masses. Good bowel sounds. Extremities: no cyanosis, clubbing, rash, 2+edema Neuro: alert & orientedx3, cranial nerves grossly intact. moves all 4  extremities w/o difficulty. Affect pleasant  Telemetry: Sinus Tachycardia 110  Lab Results: CO-OX =68.9 (63.7) Basic Metabolic Panel:  Lab 05/05/11 1191 05/04/11 0445 05/03/11 1630 05/03/11 0551 05/02/11 0616  NA 135 137 138 137 136  K 3.6 3.1* -- -- --  CL 99 100 100 98 98  CO2 29 28 26 27 28   GLUCOSE 118* 103* 106* 90 97  BUN 11 14 15 14 14   CREATININE 0.94 0.89 0.90 1.01 1.00  CALCIUM 9.1 9.0 9.2 9.3 9.6  MG 1.7 -- -- 1.6 1.7  PHOS -- -- -- -- --   Liver Function Tests:  Lab 05/03/11 0551 05/02/11 0616 04/30/11 2004  AST 21 -- 27  ALT 18 -- 18  ALKPHOS 113 -- 125*  BILITOT 1.4* 1.5* 1.8*  PROT 7.4 -- 7.9  ALBUMIN 2.9* -- 3.2*   No results found for this basename: LIPASE:5,AMYLASE:5 in the last 168 hours No results found for this basename: AMMONIA:5 in the last 168 hours CBC:  Lab 04/30/11 2004  WBC 10.0  NEUTROABS 7.5  HGB 15.1  HCT 43.3  MCV 81.5  PLT 546*   Cardiac Enzymes: No results found for this basename: CKTOTAL:5,CKMB:5,CKMBINDEX:5,TROPONINI:5 in the last 168 hours BNP:  Lab 05/01/11 0500  POCBNP 3355.0*   CBG: No results found for this basename: GLUCAP:5 in the last 168 hours Microbiology: No results found for this basename: cult   No results found for this basename: CULT:2,SDES:2 in  the last 168 hours  Imaging: No results found.   ASSESSMENT:  . 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 Dilated cardiomyopathy, likely nonischemic  3. History of beta-blocker intolerance due to fatigue and nitrates due to headache  4. Morbid obesity  5. Regular alcohol quit 4 months ago.  6. Tachycardia  7. Medication noncompliance due to financial status  8. Hypomagnesemia  9. Hypokalemia 10. NSVT (5 beats)     PLAN/DISCUSSION:   Volume over load improving. Continue Milrinone to support cardiac ouput and diuresis.Complaining of cough will need to conisder  switch to ARB if cough persists.   NSVT Magnesium 1.7. Will give 2 grams of magnesium and an extra 40 meq of potassium.  Consider low dose beta blocker Carvedilol 3.125 mg bid. Check BMET and CO-OX in am.   LOS: 5 days    CLEGG,AMY, NP 05/05/2011, 7:20 AM  Patient seen with NP, agree with note.   CVP 9 this am.  Patient has diuresed well.  He still has some volume overload with peripheral edema, JVP about 10.  Will plan to continue IV Lasix and current milrinone dosing today.  No med changes today.  Needs PT evaluation and workup.  If diureses well today, will potentially change to torsemide po tomorrow and decrease milrinone slowly to 0.125 mcg/kg/min.   [name] 05/05/2011 9:27 AM

## 2011-05-06 DIAGNOSIS — I10 Essential (primary) hypertension: Secondary | ICD-10-CM

## 2011-05-06 DIAGNOSIS — I509 Heart failure, unspecified: Secondary | ICD-10-CM

## 2011-05-06 LAB — BASIC METABOLIC PANEL
BUN: 11 mg/dL (ref 6–23)
CO2: 28 mEq/L (ref 19–32)
Chloride: 100 mEq/L (ref 96–112)
GFR calc Af Amer: >90 mL/min (ref >90)
Glucose, Bld: 116 mg/dL — ABNORMAL HIGH (ref 70–99)
Potassium: 3.4 mEq/L — ABNORMAL LOW (ref 3.5–5.1)

## 2011-05-06 LAB — CARBOXYHEMOGLOBIN: Carboxyhemoglobin: 1.6 % — ABNORMAL HIGH (ref 0.5–1.5)

## 2011-05-06 LAB — MAGNESIUM: Magnesium: 2 mg/dL (ref 1.5–2.5)

## 2011-05-06 MED ORDER — POTASSIUM CHLORIDE CRYS ER 20 MEQ PO TBCR
40.0000 meq | EXTENDED_RELEASE_TABLET | Freq: Once | ORAL | Status: AC
  Administered 2011-05-06: 40 meq via ORAL

## 2011-05-06 MED ORDER — TORSEMIDE 20 MG PO TABS
60.0000 mg | ORAL_TABLET | Freq: Every day | ORAL | Status: DC
  Administered 2011-05-06 – 2011-05-08 (×3): 60 mg via ORAL
  Filled 2011-05-06 (×3): qty 3

## 2011-05-06 NOTE — Progress Notes (Signed)
Patient ID: William Davila, male   DOB: 1966-05-11, 45 y.o.   MRN: [id number] [facility] Cardiology SUBJECTIVE:Patient continues to diurese well, weight is down 2 kg.  CVP was 4 this am (not sure if level, but suspect it is now fairly low).  No complaints.    Filed Vitals:   05/06/11 0400 05/06/11 0500 05/06/11 0600 05/06/11 0745  BP: 96/47   89/56  Pulse: 95 92 107 105  Temp: 98.5 F (36.9 C)   98.1 F (36.7 C)  TempSrc:    Oral  Resp: 18 19 24 24   Height:      Weight:  150 kg (330 lb 11 oz)    SpO2: 99% 94% 97% 100%    Intake/Output Summary (Last 24 hours) at 05/06/11 0843 Last data filed at 05/06/11 0800  Gross per 24 hour  Intake 1681.6 ml  Output   5226 ml  Net -3544.4 ml    LABS: Basic Metabolic Panel:  Basename 05/06/11 0500 05/06/11 05/05/11 0500  NA 135 -- 135  K 3.4* -- 3.6  CL 100 -- 99  CO2 28 -- 29  GLUCOSE 116* -- 118*  BUN 11 -- 11  CREATININE 0.98 -- 0.94  CALCIUM 8.5 -- 9.1  MG -- 2.0 1.7  PHOS -- -- --      . digoxin  0.25 mg Oral Daily  . enoxaparin  70 mg Subcutaneous Q24H  . furosemide  80 mg Intravenous Q8H  . lisinopril  20 mg Oral BID  . magnesium sulfate IVPB  2 g Intravenous Once  . potassium chloride  20 mEq Oral BID  . spironolactone  25 mg Oral Daily  milrinone 0.25 mcg/kg/min   PHYSICAL EXAM General: NAD Neck: Thick, JVP 8, no thyromegaly or thyroid nodule.  Lungs: Clear to auscultation bilaterally with normal respiratory effort. CV: Nondisplaced PMI.  Heart regular S1/S2, no S3/S4, 1/6 HSM at apex.  1+ edema to knees bilaterally.  No carotid bruit.  Normal pedal pulses.  Abdomen: Soft, nontender, no hepatosplenomegaly, no distention.  Neurologic: Alert and oriented x 3.  Psych: Normal affect. Extremities: No clubbing or cyanosis.   TELEMETRY: Reviewed telemetry pt in NSR:  ASSESSMENT AND PLAN:  45 yo with probable nonischemic CMP and acute on chronic systolic CHF.  He continues to diurese well.  Volume status is better.  -  Decrease milrinone today to 0.125 mcg/kg/min.  Aim to discontinue tomorrow.  - Check digoxin level - Stop IV Lasix, change to po torsemide 60 mg daily.  - Hold off on starting Coreg today with soft BP.  Has not tolerated nitrates in the past.   [name] 05/06/2011 8:48 AM

## 2011-05-06 NOTE — Progress Notes (Signed)
Subjective: No events overnight. No c/o.  Objective: Vital signs in last 24 hours: Filed Vitals:   05/06/11 0500 05/06/11 0600 05/06/11 0745 05/06/11 0928  BP:   89/56 101/50  Pulse: 92 107 105   Temp:   98.1 F (36.7 C)   TempSrc:   Oral   Resp: 19 24 24    Height:      Weight: 330 lb 11 oz (150 kg)     SpO2: 94% 97% 100%    Weight change: -4 lb 6.6 oz (-2 kg)  Intake/Output Summary (Last 24 hours) at 05/06/11 1006 Last data filed at 05/06/11 0932  Gross per 24 hour  Intake 1448.6 ml  Output   4826 ml  Net -3377.4 ml   Physical Exam: GEN: NAD. Alert and oriented x 3. Pleasant, conversant, and cooperative to exam.  RESP: CTAB, no w/r/r  CARDIOVASCULAR: RRR, distant S1, S2  ABDOMEN: soft, NT/ND, NABS  EXT: warm and dry. Edema much improved b/l.  Lab Results: Basic Metabolic Panel:  Lab 05/06/11 1610 05/06/11 05/05/11 0500  NA 135 -- 135  K 3.4* -- 3.6  CL 100 -- 99  CO2 28 -- 29  GLUCOSE 116* -- 118*  BUN 11 -- 11  CREATININE 0.98 -- 0.94  CALCIUM 8.5 -- 9.1  MG -- 2.0 1.7  PHOS -- -- --   Liver Function Tests:  Lab 05/03/11 0551 05/02/11 0616 04/30/11 2004  AST 21 -- 27  ALT 18 -- 18  ALKPHOS 113 -- 125*  BILITOT 1.4* 1.5* --  PROT 7.4 -- 7.9  ALBUMIN 2.9* -- 3.2*   CBC:  Lab 04/30/11 2004  WBC 10.0  NEUTROABS 7.5  HGB 15.1  HCT 43.3  MCV 81.5  PLT 546*   BNP:  Lab 05/01/11 0500  POCBNP 3355.0*   Fasting Lipid Panel:  Lab 05/01/11 0600  CHOL 100  HDL 27*  LDLCALC 62  TRIG 54  CHOLHDL 3.7  LDLDIRECT --    Micro Results: Recent Results (from the past 240 hour(s))  MRSA PCR SCREENING     Status: Normal   Collection Time   05/05/11 10:28 AM      Component Value Range Status Comment   MRSA by PCR NEGATIVE  NEGATIVE  Final    Studies/Results: No results found. Medications: I have reviewed the patient's current medications. Scheduled Meds:   . digoxin  0.25 mg Oral Daily  . enoxaparin  70 mg Subcutaneous Q24H  . lisinopril  20  mg Oral BID  . potassium chloride  20 mEq Oral BID  . potassium chloride  40 mEq Oral Once  . spironolactone  25 mg Oral Daily  . torsemide  60 mg Oral Daily  . DISCONTD: furosemide  80 mg Intravenous Q8H   Continuous Infusions:   . sodium chloride 250 mL (05/06/11 0600)  . milrinone 0.125 mcg/kg/min (05/06/11 0932)   PRN Meds:.acetaminophen, guaiFENesin-dextromethorphan, ondansetron (ZOFRAN) IV, sodium chloride  Assessment/Plan: # Acute on Chronic Dilated Cardiomyopathy c CHF  Pt con't on milrinone, diuresing well. Neg 14 L net for this admission.  Appreciate guidance from CHF team for management decisions.  - appreciate CHF service input/co-management  - digoxin 0.25 qd  - lasix 80mg  tid --> torsemide today - lisinopril 20mg  bid  - spironolactone 25mg  qd  - trend CI  - milrinone infusion  - considering coreg  # Hypokalemia:  Con't repleting in the setting of IV lasix. Gave extra dose this morning. Also repleting mag.  - repletion    #  Hyperbilirubinemia  Bili elevated at 1.8 amidst other LFTs essentially normal. Fractionated split evenly. Will con't to follow, but could be Gilbert's. Trended down slightly today. Holding on more studies, but could consider:  - hepatitis panel  - HIV ab  - abdominal u/s   # HTN  - continue lisinopril and monitor BP. We will like blood pressure to be as low as possible as long as he is asymptomatic.   # HLD: Very well controlled without statin. No need to start.   #DVT prophylaxis: lovenox   LOS: 6 days   [name], [name] 05/06/2011, 10:06 AM

## 2011-05-07 LAB — CARBOXYHEMOGLOBIN
Carboxyhemoglobin: 1.3 % (ref 0.5–1.5)
O2 Saturation: 69.2 %

## 2011-05-07 LAB — BASIC METABOLIC PANEL
BUN: 12 mg/dL (ref 6–23)
GFR calc Af Amer: >90 mL/min (ref >90)
GFR calc non Af Amer: 89 mL/min — ABNORMAL LOW (ref >90)
Potassium: 4 mEq/L (ref 3.5–5.1)

## 2011-05-07 MED ORDER — CARVEDILOL 3.125 MG PO TABS
1.5620 mg | ORAL_TABLET | Freq: Two times a day (BID) | ORAL | Status: DC
  Administered 2011-05-07 (×2): 1.562 mg via ORAL
  Filled 2011-05-07 (×5): qty 0.5

## 2011-05-07 NOTE — Progress Notes (Addendum)
Patient ID: William Davila, male   DOB: 05-10-1966, 45 y.o.   MRN: [id number]  [facility] Cardiology  SUBJECTIVE:Patient continues to diurese well, weight is down over 1 kg.  No complaints this am.  Creatinine stable with lower milrinone dose.    Filed Vitals:   05/06/11 2139 05/07/11 0000 05/07/11 0400 05/07/11 0415  BP: 97/63 97/74  96/47  Pulse:      Temp:  98.5 F (36.9 C) 97.9 F (36.6 C)   TempSrc:  Oral Oral   Resp:  20 20 17   Height:      Weight:   148.8 kg (328 lb 0.7 oz)   SpO2:  99% 95%     Intake/Output Summary (Last 24 hours) at 05/07/11 0808 Last data filed at 05/07/11 0600  Gross per 24 hour  Intake 1732.9 ml  Output   4202 ml  Net -2469.1 ml    LABS: Basic Metabolic Panel:  Basename 05/07/11 0446 05/06/11 0500 05/06/11 05/05/11 0500  NA 133* 135 -- --  K 4.0 3.4* -- --  CL 96 100 -- --  CO2 28 28 -- --  GLUCOSE 89 116* -- --  BUN 12 11 -- --  CREATININE 1.01 0.98 -- --  CALCIUM 9.0 8.5 -- --  MG -- -- 2.0 1.7  PHOS -- -- -- --      . digoxin  0.25 mg Oral Daily  . enoxaparin  70 mg Subcutaneous Q24H  . lisinopril  20 mg Oral BID  . potassium chloride  20 mEq Oral BID  . potassium chloride  40 mEq Oral Once  . spironolactone  25 mg Oral Daily  . torsemide  60 mg Oral Daily  . DISCONTD: furosemide  80 mg Intravenous Q8H  milrinone 0.125 mcg/kg/min   PHYSICAL EXAM General: NAD Neck: Thick, JVP 8, no thyromegaly or thyroid nodule.  Lungs: Clear to auscultation bilaterally with normal respiratory effort. CV: Nondisplaced PMI.  Heart regular S1/S2, no S3/S4, 1/6 HSM at apex.  Trace ankle edema.  No carotid bruit.  Normal pedal pulses.  Abdomen: Soft, nontender, no hepatosplenomegaly, no distention.  Neurologic: Alert and oriented x 3.  Psych: Normal affect. Extremities: No clubbing or cyanosis.   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  He continues to diurese well.  Volume status is better.    - Stop milrinone today.   - digoxin level ok. - Continue po torsemide.  - Will start very low dose Coreg, 1/2 3.125 mg bid.  - Potential d/c tomorrow.   [name] 05/07/2011 8:08 AM

## 2011-05-07 NOTE — Progress Notes (Signed)
CSW met with patient to provide support and also assess barriers to care. CSW also reviewed the CHF management guidelines and stressed the importance of weighing daily, eating a low sodium diet, adhering to all medications and monitoring his symptoms closely using the zone tool. The patient reported that he has not been strictly monitoring his diet and has not been weighing himself daily(patient has no scale at home).  Patient also does not regularly take his medications due to the cost.  . Pt does not have a job and has no income. However,Patient has a good support system and currently lives with his parents and does not have any barriers with transportation. CSW will provide pt with a scale and set up an appointment to be seen in the [facility]. CSW also contacted Leota Sauers to discuss getting the pt. A supply of heart failure meds. Please look in shadow chart for the heart failure med order set before d/c the patient. The patient reported that he will be able to come on Monday to the outpatient pharmacy to pick up his medications. Please make sure he d/cs with a 3 day supply of meds. Please call the pharmacist on call if you have any questions.

## 2011-05-07 NOTE — Progress Notes (Signed)
Physical Therapy Treatment Patient Details Name: William Davila MRN: [id number] DOB: 23-Oct-1965 Today's Date: 05/07/2011  PT Assessment/Plan  PT - Assessment/Plan Comments on Treatment Session: pt improved today with ambulation, however needs to increase acticity each day.   PT Plan: Discharge plan remains appropriate PT Frequency: Min 3X/week Follow Up Recommendations: Home health PT Equipment Recommended: None recommended by PT PT Goals  Acute Rehab PT Goals PT Goal: Rolling Supine to Right Side - Progress: Not met PT Goal: Rolling Supine to Left Side - Progress: Not met PT Goal: Supine/Side to Sit - Progress: Not met PT Goal: Sit to Supine/Side - Progress: Not met PT Transfer Goal: Sit to Stand/Stand to Sit - Progress: Progressing toward goal PT Transfer Goal: Bed to Chair/Chair to Bed - Progress: Progressing toward goal PT Goal: Ambulate - Progress: Progressing toward goal PT Goal: Up/Down Stairs - Progress: Not met  PT Treatment Precautions/Restrictions  Precautions Precautions: Fall Required Braces or Orthoses: No Restrictions Weight Bearing Restrictions: No Mobility (including Balance) Bed Mobility Bed Mobility: No Transfers Transfers: Yes Sit to Stand: 4: Min assist;From chair/3-in-1;With upper extremity assist Sit to Stand Details (indicate cue type and reason): pt needed momentum for sit to stand from recliner Stand to Sit: 4: Min assist;With upper extremity assist;With armrests;To chair/3-in-1 Stand to Sit Details: cues to get closer to chair and control descent.   Ambulation/Gait Ambulation/Gait: Yes Ambulation/Gait Assistance: 4: Min assist Ambulation/Gait Assistance Details (indicate cue type and reason): cues for decreased WB on UEs, upright posture and encouragment Ambulation Distance (Feet): 70 Feet Assistive device: Rolling walker Gait Pattern: Decreased stride length;Shuffle;Trunk flexed Stairs: No    Exercise    End of Session PT - End of  Session Equipment Utilized During Treatment: Gait belt Activity Tolerance: Patient limited by fatigue Patient left: in chair;with call bell in reach;with family/visitor present Nurse Communication: Mobility status for ambulation;Mobility status for transfers General Behavior During Session: [facility] for tasks performed Cognition: [facility] for tasks performed  Sunny Schlein, [place] [phone] 05/07/2011, 12:06 PM

## 2011-05-07 NOTE — Progress Notes (Signed)
Subjective: No events overnight.  Pt much more interactive today, but still clearly does not understand the scope of his disease.  Objective: Vital signs in last 24 hours: Filed Vitals:   05/07/11 0400 05/07/11 0415 05/07/11 0810 05/07/11 0901  BP:  96/47 92/65   Pulse:    98  Temp: 97.9 F (36.6 C)  97.8 F (36.6 C)   TempSrc: Oral  Oral   Resp: 20 17 18    Height:      Weight: 328 lb 0.7 oz (148.8 kg)     SpO2: 95%  98%    Weight change: -2 lb 10.3 oz (-1.2 kg)  Intake/Output Summary (Last 24 hours) at 05/07/11 0931 Last data filed at 05/07/11 0800  Gross per 24 hour  Intake   1953 ml  Output   4602 ml  Net  -2649 ml   Physical Exam: GEN: NAD. Alert and oriented x 3. Pleasant, conversant, and cooperative to exam.  RESP: CTAB, no w/r/r  CARDIOVASCULAR: RRR, distant S1, S2  ABDOMEN: soft, NT/ND, NABS  EXT: warm and dry. Edema much improved b/l but still present L>R  Lab Results: Basic Metabolic Panel:  Lab 05/07/11 1610 05/06/11 0500 05/06/11 05/05/11 0500  NA 133* 135 -- --  K 4.0 3.4* -- --  CL 96 100 -- --  CO2 28 28 -- --  GLUCOSE 89 116* -- --  BUN 12 11 -- --  CREATININE 1.01 0.98 -- --  CALCIUM 9.0 8.5 -- --  MG -- -- 2.0 1.7  PHOS -- -- -- --   Liver Function Tests:  Lab 05/03/11 0551 05/02/11 0616 04/30/11 2004  AST 21 -- 27  ALT 18 -- 18  ALKPHOS 113 -- 125*  BILITOT 1.4* 1.5* --  PROT 7.4 -- 7.9  ALBUMIN 2.9* -- 3.2*   CBC:  Lab 04/30/11 2004  WBC 10.0  NEUTROABS 7.5  HGB 15.1  HCT 43.3  MCV 81.5  PLT 546*   BNP:  Lab 05/01/11 0500  POCBNP 3355.0*   Fasting Lipid Panel:  Lab 05/01/11 0600  CHOL 100  HDL 27*  LDLCALC 62  TRIG 54  CHOLHDL 3.7  LDLDIRECT --   Urine Drug Screen: Drugs of Abuse     Component Value Date/Time   LABOPIA POSITIVE* 05/01/2011 2147   COCAINSCRNUR NONE DETECTED 05/01/2011 2147   LABBENZ NONE DETECTED 05/01/2011 2147   AMPHETMU NONE DETECTED 05/01/2011 2147   THCU NONE DETECTED 05/01/2011 2147     LABBARB NONE DETECTED 05/01/2011 2147      Micro Results: Recent Results (from the past 240 hour(s))  MRSA PCR SCREENING     Status: Normal   Collection Time   05/05/11 10:28 AM      Component Value Range Status Comment   MRSA by PCR NEGATIVE  NEGATIVE  Final    Studies/Results: No results found. Medications: I have reviewed the patient's current medications. Scheduled Meds:   . carvedilol  1.562 mg Oral BID WC  . digoxin  0.25 mg Oral Daily  . enoxaparin  70 mg Subcutaneous Q24H  . lisinopril  20 mg Oral BID  . potassium chloride  20 mEq Oral BID  . spironolactone  25 mg Oral Daily  . torsemide  60 mg Oral Daily   Continuous Infusions:   . sodium chloride 250 mL (05/06/11 0600)  . DISCONTD: milrinone 0.124 mcg/kg/min (05/06/11 1800)   PRN Meds:.acetaminophen, guaiFENesin-dextromethorphan, ondansetron (ZOFRAN) IV, sodium chloride  Assessment/Plan: # Acute on Chronic Dilated Cardiomyopathy c  CHF  Pt con't diuresing well, neg 16 L net for this admission. Appreciate guidance from CHF team for management decisions.  - appreciate CHF service input/co-management  - d/c milrinone today - digoxin 0.25 qd  - lasix 80mg  tid --> torsemide today - lisinopril 20mg  bid  - spironolactone 25mg  qd  - trend CI  - starting coreg 1/2 3.125 mg bid  # Hypokalemia:  K 4.0 today.  Will con't for one more day of PO K.  - repletion  # Hyperbilirubinemia  Bili elevated at 1.8 amidst other LFTs essentially normal. Fractionated split evenly. Will con't to follow, but could be Gilbert's. Trended down slightly today. Holding on more studies, but could consider:  - hepatitis panel  - HIV ab  - abdominal u/s   # HTN  - continue lisinopril and monitor BP. We will like blood pressure to be as low as possible as long as he is asymptomatic.   # HLD: Very well controlled without statin. No need to start.   #DVT prophylaxis: lovenox   LOS: 7 days   [name], [name] 05/07/2011, 9:31  AM

## 2011-05-08 LAB — BASIC METABOLIC PANEL
Calcium: 9.6 mg/dL (ref 8.4–10.5)
Chloride: 96 mEq/L (ref 96–112)
Creatinine, Ser: 1.01 mg/dL (ref 0.50–1.35)
GFR calc Af Amer: >90 mL/min (ref >90)

## 2011-05-08 MED ORDER — TORSEMIDE 20 MG PO TABS: 60.0000 mg | ORAL_TABLET | Freq: Every day | ORAL | Status: DC

## 2011-05-08 MED ORDER — SPIRONOLACTONE 25 MG PO TABS: 25.0000 mg | ORAL_TABLET | Freq: Every day | ORAL | Status: DC

## 2011-05-08 MED ORDER — CARVEDILOL 3.125 MG PO TABS: 3.1250 mg | ORAL_TABLET | Freq: Two times a day (BID) | ORAL | Status: DC

## 2011-05-08 MED ORDER — CARVEDILOL 3.125 MG PO TABS
3.1250 mg | ORAL_TABLET | Freq: Two times a day (BID) | ORAL | Status: DC
  Administered 2011-05-08: 3.125 mg via ORAL
  Filled 2011-05-08 (×3): qty 1

## 2011-05-08 MED ORDER — DIGOXIN 250 MCG PO TABS: 0.2500 mg | ORAL_TABLET | Freq: Every day | ORAL | Status: DC

## 2011-05-08 MED ORDER — LISINOPRIL 20 MG PO TABS: 20.0000 mg | ORAL_TABLET | Freq: Two times a day (BID) | ORAL | Status: DC

## 2011-05-08 MED ORDER — POTASSIUM CHLORIDE CRYS ER 20 MEQ PO TBCR
20.0000 meq | EXTENDED_RELEASE_TABLET | Freq: Every day | ORAL | Status: DC
  Administered 2011-05-08: 20 meq via ORAL

## 2011-05-08 NOTE — Progress Notes (Signed)
Pt aox4, vss, no apparent distress at time of d/c. D/c instructions instructions given to pt and his mother to include meds, diet, activity, and follow-up. Pt and mother verbalize understanding and agreement with d/c instructions. Pt was given three day supply of medications from pharmacy. Was given a scrips to fill on Monday with orange card. Picc line removed by picc rn. Pt released to care of mother in no apparent distress. Pt wheeled out to lobby.

## 2011-05-08 NOTE — Progress Notes (Signed)
Subjective: Feeling good today.  5 beat run of asymptomatic V tach.  Breathing well.  Labs are stable.  Cards as seen and feel he can be discharged today with close follow up in the Heart Failure Clinic.   Objective: Vital signs in last 24 hours: Filed Vitals:   05/08/11 0000 05/08/11 0400 05/08/11 0800 05/08/11 1033  BP: 100/75 101/73  95/76  Pulse:      Temp: 98.1 F (36.7 C) 98 F (36.7 C) 97.5 F (36.4 C)   TempSrc:   Oral   Resp:    22  Height:      Weight:      SpO2: 97% 98%  98%   Weight change:   Intake/Output Summary (Last 24 hours) at 05/08/11 1129 Last data filed at 05/08/11 1000  Gross per 24 hour  Intake   1220 ml  Output   1475 ml  Net   -255 ml   Physical Exam: Vitals reviewed. General: resting in bed, NAD HEENT: PERRL, EOMI, no scleral icterus Cardiac: RRR, normal S1/S2, 1/6 HSM at apex.  no rubs, murmurs or gallops Pulm: clear to auscultation bilaterally, no wheezes, rales, or rhonchi Abd: soft, nontender, nondistended, BS present Ext: warm and well perfused, +1 peripheral edema Neuro: alert and oriented X3, cranial nerves II-XII grossly intact, strength and sensation to light touch equal in bilateral upper and lower extremities  Lab Results: Basic Metabolic Panel:  Lab 05/08/11 1308 05/07/11 0446 05/06/11 05/05/11 0500  NA 132* 133* -- --  K 4.4 4.0 -- --  CL 96 96 -- --  CO2 26 28 -- --  GLUCOSE 94 89 -- --  BUN 13 12 -- --  CREATININE 1.01 1.01 -- --  CALCIUM 9.6 9.0 -- --  MG -- -- 2.0 1.7  PHOS -- -- -- --   Liver Function Tests:  Lab 05/03/11 0551 05/02/11 0616  AST 21 --  ALT 18 --  ALKPHOS 113 --  BILITOT 1.4* 1.5*  PROT 7.4 --  ALBUMIN 2.9* --   Urine Drug Screen: Drugs of Abuse     Component Value Date/Time   LABOPIA POSITIVE* 05/01/2011 2147   COCAINSCRNUR NONE DETECTED 05/01/2011 2147   LABBENZ NONE DETECTED 05/01/2011 2147   AMPHETMU NONE DETECTED 05/01/2011 2147   THCU NONE DETECTED 05/01/2011 2147   LABBARB NONE  DETECTED 05/01/2011 2147    Misc. Labs: Carboxyhemoglobin: 1.3 down from 1.6  Micro Results: Recent Results (from the past 240 hour(s))  MRSA PCR SCREENING     Status: Normal   Collection Time   05/05/11 10:28 AM      Component Value Range Status Comment   MRSA by PCR NEGATIVE  NEGATIVE  Final    Medications: I have reviewed the patient's current medications. Scheduled Meds:   . carvedilol  3.125 mg Oral BID WC  . digoxin  0.25 mg Oral Daily  . enoxaparin  70 mg Subcutaneous Q24H  . lisinopril  20 mg Oral BID  . potassium chloride  20 mEq Oral Daily  . spironolactone  25 mg Oral Daily  . torsemide  60 mg Oral Daily  . DISCONTD: carvedilol  1.562 mg Oral BID WC  . DISCONTD: potassium chloride  20 mEq Oral BID   Continuous Infusions:   . sodium chloride 250 mL (05/06/11 0600)   PRN Meds:.acetaminophen, guaiFENesin-dextromethorphan, ondansetron (ZOFRAN) IV, sodium chloride  Assessment/Plan: 1. Acute on Chronic Dilated Cardiomyopathy with CHF exacerbation:  Diuresing well on oral Torsemide, neg 16 L net  for this admission. Appreciate guidance from CHF team for management decisions.  - appreciate CHF service input/co-management  - digoxin 0.25 qd  - torsemide 60 mg daily - lisinopril 20mg  bid  - spironolactone 25mg  qd  - coreg 3.125 mg bid   2. Hypokalemia:  K 4.0 today. Will continue with 20 mEq of KCl daily and recheck in 1 week.  3.  Hyperbilirubinemia:  Bili elevated at 1.8 amidst other LFTs essentially normal. Fractionated split evenly. Will con't to follow, but could be Gilbert's. Trended down slightly today. Holding on more studies but will follow as an outpatient.  4.  HTN:  On Coreg, Lisinopril 20 mg BID, Torsemide, Spironolactone 25 mg and Digoxin .25 mg.  BP stable with SBP in the 100s  5. HLD: Very well controlled without statin.   6. DVT prophylaxis: lovenox   LOS: 8 days   William Davila 05/08/2011, 11:29 AM

## 2011-05-08 NOTE — Progress Notes (Addendum)
Patient requesting assistance with meds. Unable to use discount orange card until Monday. Contacted main pharmacy and pt is eligible for medication assistance through [employer] fund. Will provide 3 day supply from [employer] fund. Explained to pt that fund can be utilized once in a year. Faxed prescriptions for CHF meds to [facility]. Gave pt's mother directions, address and phone no to pharmacy.

## 2011-05-08 NOTE — Progress Notes (Signed)
Patient ID: ALI MOHL, male   DOB: 1965-09-13, 45 y.o.   MRN: [id number]  [facility]  SUBJECTIVE: No complaints this morning, breathing well.  Creatinine and BP stable off milrinone.    Filed Vitals:   05/07/11 1653 05/07/11 2000 05/08/11 0000 05/08/11 0400  BP:  97/73 100/75 101/73  Pulse: 102 101    Temp:  97.4 F (36.3 C) 98.1 F (36.7 C) 98 F (36.7 C)  TempSrc:      Resp:  18    Height:      Weight:      SpO2:  98% 97% 98%    Intake/Output Summary (Last 24 hours) at 05/08/11 0815 Last data filed at 05/07/11 2200  Gross per 24 hour  Intake    900 ml  Output   1475 ml  Net   -575 ml    LABS: Basic Metabolic Panel:  Basename 05/08/11 0600 05/07/11 0446 05/06/11  NA 132* 133* --  K 4.4 4.0 --  CL 96 96 --  CO2 26 28 --  GLUCOSE 94 89 --  BUN 13 12 --  CREATININE 1.01 1.01 --  CALCIUM 9.6 9.0 --  MG -- -- 2.0  PHOS -- -- --      . carvedilol  1.562 mg Oral BID WC  . digoxin  0.25 mg Oral Daily  . enoxaparin  70 mg Subcutaneous Q24H  . lisinopril  20 mg Oral BID  . potassium chloride  20 mEq Oral BID  . spironolactone  25 mg Oral Daily  . torsemide  60 mg Oral Daily    PHYSICAL EXAM General: NAD Neck: Thick, JVP 7, no thyromegaly or thyroid nodule.  Lungs: Clear to auscultation bilaterally with normal respiratory effort. CV: Nondisplaced PMI.  Heart regular S1/S2, no S3/S4, 1/6 HSM at apex.  1+ ankle edema.  No carotid bruit.  Normal pedal pulses.  Abdomen: Soft, nontender, no hepatosplenomegaly, no distention.  Neurologic: Alert and oriented x 3.  Psych: Normal affect. Extremities: No clubbing or cyanosis.   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  He has diuresed well and is now off milrinone with stable BP and creatinine.  Volume status is better.   - Continue po torsemide.  - Increase Coreg to 3.125 mg bid, continue other meds - I think he could go home today.  He will need close  followup in heart failure clinic (needs to be seen within a week).  Please see social work note about obtaining medications as an outpatient.   [name] 05/08/2011 8:15 AM

## 2011-05-10 NOTE — Discharge Summary (Signed)
Internal Medicine [facility] Discharge Note  Name: William Davila MRN: [id number] DOB: Oct 14, 1965 45 y.o.  Date of Admission: 04/30/2011  5:10 PM Date of Discharge: 05/10/2011 Attending Physician: No att. providers found  Discharge Diagnosis: 1. CARDIOMYOPATHY, DILATED 2. Chronic systolic heart failure 3. HYPERLIPIDEMIA 4. HYPERTENSION 5. Tobacco abuse 6. Quadriceps muscle rupture. 7. Atrial fibrillation with RVR  Discharge Medications: Discharge Medication List as of 05/08/2011  1:30 PM    START taking these medications   Details  carvedilol (COREG) 3.125 MG tablet Take 1 tablet (3.125 mg total) by mouth 2 (two) times daily with a meal., Starting 05/08/2011, Until Sun 05/07/12, Print    digoxin (LANOXIN) 0.25 MG tablet Take 1 tablet (0.25 mg total) by mouth daily., Starting 05/08/2011, Until Sun 05/07/12, Print    spironolactone (ALDACTONE) 25 MG tablet Take 1 tablet (25 mg total) by mouth daily., Starting 05/08/2011, Until Sun 05/07/12, Print    torsemide (DEMADEX) 20 MG tablet Take 3 tablets (60 mg total) by mouth daily., Starting 05/08/2011, Until Sun 05/07/12, Print      CONTINUE these medications which have CHANGED   Details  lisinopril (PRINIVIL,ZESTRIL) 20 MG tablet Take 1 tablet (20 mg total) by mouth 2 (two) times daily., Starting 05/08/2011, Until Sun 05/07/12, Print      CONTINUE these medications which have NOT CHANGED   Details  HYDROcodone-acetaminophen (VICODIN) 5-500 MG per tablet Take 2 tablets by mouth every 6 (six) hours as needed for pain., Starting 04/27/2011, Until Wed 04/26/12, Print    potassium chloride (K-DUR) 10 MEQ tablet Take 2 tablets (20 mEq total) by mouth 2 (two) times daily., Starting 04/27/2011, Until Wed 04/26/12, Normal      STOP taking these medications     furosemide (LASIX) 80 MG tablet        Disposition and follow-up:   [name] was discharged from [facility] in Stable and improved  condition.  He will have a follow up appointment in the Advanced Heart Failure clinic Monday or Tuesday.  He will also have a follow up appointment in the [facility] next week as well.  He should weigh himself daily and if his weight rises more then 3-4 lbs per day for a few consecutive days he should call the Heart Failure Clinic.  Follow-up Appointments: Follow-up Information    Follow up with [facility]. (They will call you with an appointment.  If you do not hear by Tuesday please call [phone])    Contact information:   [facility]  [phone]      [facility] with  [facility]. (They will call you with an appointment for next week.)    Contact information:   [address] [place] [address]         Discharge Orders    Future Appointments: Provider: Department: Dept Phone: Center:   05/13/2011 1:45 PM Amanjot Sidhu [facility] [phone] [facility]   05/13/2011 3:45 PM Micheline Chapman, MD [facility] [phone] [facility]     Future Orders Please Complete By Expires   Diet - low sodium heart healthy      Increase activity slowly      Discharge instructions      Comments:   1.  Start all your medications as listed in the Discharge Medication list. 2. You will be called Monday with an appointment with the Heart Failure Clinic.  If you have not heard by  Tuesday please call their office at [phone]. 3.  You will be called by the Internal Medicine Clinic to get an appointment set up for follow up as well.   Heart Failure patients record your daily weight using the same scale at the same time of day      Avoid straining      STOP any activity that causes chest pain, shortness of breath, dizziness, sweating, or exessive weakness      Call MD for:  difficulty breathing, headache or visual disturbances      (HEART FAILURE PATIENTS) Call MD:  Anytime you have any of the following symptoms: 1) 3 pound weight gain in  24 hours or 5 pounds in 1 week 2) shortness of breath, with or without a dry hacking cough 3) swelling in the hands, feet or stomach 4) if you have to sleep on extra pillows at night in order to breathe.      ACE Inhibitor / ARB already ordered        Consultations: Treatment Team:  Jonelle Sidle, MD, Heart Failure Team  Procedures Performed:  X-ray Chest Pa And Lateral  05/01/2011  *RADIOLOGY REPORT*  Clinical Data: CHF exacerbation.  CHEST - 2 VIEW  Comparison: 07/13/2006  Findings: Cardiomegaly with central vascular congestion.  No focal consolidation, pleural effusion, or pneumothorax. Mild interstitial prominence.  No acute osseous abnormality.  IMPRESSION: Cardiomegaly with central vascular congestion. Mild interstitial prominence may reflect a mild edema pattern.  Original Report Authenticated By: Waneta Martins, M.D.   2D Echo: Left ventricle: The cavity size was severely dilated. Wall thickness was increased in a pattern of moderate LVH. Systolic function was severely reduced. The estimated ejection fraction was in the range of 10% to 20%. Doppler parameters are consistent with an irreversible restrictive pattern, indicative of decreased left ventricular diastolic compliance and/or increased left atrial pressure (grade 4 diastolic dysfunction). Doppler parameters are consistent with high ventricular filling pressure.  - Mitral valve: Mild to moderate regurgitation. - Left atrium: The atrium was severely dilated. - Right ventricle: The cavity size was mildly dilated. Systolic function was mildly reduced. Systolic pressure was mildly to moderately increased. - Right atrium: The atrium was mildly dilated. - Atrial septum: No defect or patent foramen ovale was identified. - Tricuspid valve: Moderate-severe regurgitation. - Pulmonary arteries: PA peak pressure: 36mm Hg (S).  Lower extremity Doppler: Summary: No evidence of deep vein or superficial thrombosis involving the right  lower extremity and left lower extremity.  Admission HPI: Mr. [name] is a 45 year old gentleman with past medical history significant for dilated cardiomyopathy and EF of 10-15% per 2-D echo on 04/27/2011. He presents as an admission from the outpatient clinic for persistent swelling despite outpatient treatment with increased by mouth Lasix. The patient states he initially noted some increased lower extremity edema approximately one month ago that has gotten worse over the past week. He was seen in the clinic on November 13 at which time his Lasix was increased to 120 mg by mouth twice a day and a repeat 2-D echocardiogram was obtained that revealed a slight decrease in his ejection fraction from 20% to 10-15%. The patient feels that his lower extremities somewhat improved following the increase in his Lasix dose however he has not yet returned to baseline. He denies shortness of breath, chest pain, syncope, or palpitations. He states he used to follow with a cardiologist by the name of Dr. Katrinka Blazing but has not seen a cardiologist since approximately 2006. He  has no other concerns or complaints today.  Hospital Course by problem list: 1. Chronic systolic heart failure: Mr. Ottaviano was admitted from clinic because of increased peripheral edema.  He was started on IV diuresis and responded minimally at first.   The Heart Failure service was consulted and they transferred him to the cardiac stepdown unit, placed a PICC line, and he was given Milrinone and IV lasix in an attempt to increase his cardiac output to assist in diuresis.  He was also started on Digoxin.  He responded well to the IV lasix after his cardiac output was increased and he was able to diureses approximately 16 L of fluid during this admission.  His weight at admission was 345 lbs and at discharge was 328.  He was slowly weaned off the Milrinone and transitioned to Oral Torsemide.  He has not seen a cardiologist in about 2 years since being  discharged from [facility].  He will be followed in the Advanced Heart Failure Clinic and [facility] as an outpatient.  They are working with the Outpatient pharmacy to get him his medications.  2. CARDIOMYOPATHY, DILATED:  Echo cardiogram shows severely dilated ventricles and with an EF of 10-20%.  He will be followed as an outpatient by [facility].  3. HYPERLIPIDEMIA:  On admission he had been not taking his Pravastatin for some time.  Fasting lipid panel showed an LDL of 62 and HDL of 27.  He will need continued follow up but has no indication for statin therapy.  4. HYPERTENSION:  Blood pressure on admission was soft and with the continued diuresis he continued to be hypotensive.  He was admitted to SDU and given milrinone to enhance diuresis.  With the withdrawal of milrinone and the addition of digoxin his blood pressure at discharge was better.  Given his cardiomyopathy he was started on very low doses of ACEI and Beta blocker. He will need continued monitoring of his blood pressure and adjustment of his medications.  5. Tobacco abuse:  He was counseled on admission that he would need to quit smoking.  Tobacco cessation counseling was given and he will need continued monitoring as an outpatient.  6. Quadriceps muscle rupture:  He worked with physical therapy during his stay and will need continued physical therapy after discharge.  7. Atrial fibrillation with RVR:  On admission there was some question of A. Fib with RVR on his initial EKG.  He was on telemetry thoughout his stay and other then a few asymptomatic runs of Vtach he had no indication of atrial fibrillation and was therefore not started on anticoagulation.     Discharge Vitals:  BP 95/76  Pulse 101  Temp(Src) 97.7 F (36.5 C) (Oral)  Resp 22  Ht 5\' 11"  (1.803 m)  Wt 328 lb 0.7 oz (148.8 kg)  BMI 45.75 kg/m2  SpO2 98%  Discharge Labs: No results found for this or any previous visit (from the past 24  hour(s)).  Signed: Alzada Brazee 05/10/2011, 11:56 AM

## 2011-05-12 ENCOUNTER — Encounter: Payer: Self-pay | Admitting: *Deleted

## 2011-05-13 ENCOUNTER — Encounter: Payer: Self-pay | Admitting: Internal Medicine

## 2011-05-13 ENCOUNTER — Ambulatory Visit (INDEPENDENT_AMBULATORY_CARE_PROVIDER_SITE_OTHER): Payer: Self-pay | Admitting: Internal Medicine

## 2011-05-13 ENCOUNTER — Encounter: Payer: Self-pay | Admitting: Cardiovascular Disease

## 2011-05-13 ENCOUNTER — Ambulatory Visit (INDEPENDENT_AMBULATORY_CARE_PROVIDER_SITE_OTHER): Payer: Self-pay | Admitting: Cardiovascular Disease

## 2011-05-13 VITALS — BP 88/60 | HR 76 | Ht 70.0 in | Wt 304.0 lb

## 2011-05-13 DIAGNOSIS — I428 Other cardiomyopathies: Secondary | ICD-10-CM

## 2011-05-13 DIAGNOSIS — R0989 Other specified symptoms and signs involving the circulatory and respiratory systems: Secondary | ICD-10-CM

## 2011-05-13 DIAGNOSIS — I4891 Unspecified atrial fibrillation: Secondary | ICD-10-CM

## 2011-05-13 MED ORDER — LISINOPRIL 20 MG PO TABS: 20.0000 mg | ORAL_TABLET | Freq: Every day | ORAL | Status: DC

## 2011-05-13 NOTE — Assessment & Plan Note (Signed)
Ejection fraction 10-15% Recently admitted for diuresis. Discharge on torsemide to be taken 3 times daily. Patient also on beta blocker, ACE inhibitor and spironolactone. Patient is doing well post hospitalization. No recent weight gain. Continue current therapy. Will decrease lisinopril slightly to 20 mg as patient's blood pressure was 80/50. Patient is to followup with heart failure clinic today at 3:45 PM.

## 2011-05-13 NOTE — Patient Instructions (Signed)
Please take only 20 mg of Lisinopril, so only half tablet. Please take all other medications as directed. Please follow up in 1 month.

## 2011-05-13 NOTE — Progress Notes (Signed)
  Subjective:    Patient ID: William Davila, male    DOB: 1966/04/29, 45 y.o.   MRN: [id number]  HPI [name] is a 45 year old gentleman with past medical history significant for dilated cardiomyopathy with ejection fraction of 10-15%, atrial fibrillation and hyper lipidemia who presents today for hospital followup. Patient was recently admitted for exacerbation of heart failure. Patient was aggressively diuresed with Lasix. He was discharged on torsemide to be taken 3 times daily. Patient initially had a weight of 345 pounds prior to admission area and patient today weighs 308 pounds. She does not complaining of any symptoms. He denies shortness of breath, or lower extremity edema.    Review of Systems  All other systems reviewed and are negative.       Objective:   Physical Exam  Constitutional: He appears well-developed.  Neck: Normal range of motion. No JVD present.  Cardiovascular: Normal rate.   Pulmonary/Chest: Effort normal. He has no rales.  Abdominal: Soft.  Musculoskeletal: He exhibits no edema.          Assessment & Plan:

## 2011-05-13 NOTE — Assessment & Plan Note (Signed)
Looked through hospital records for further information regarding this problem as I do not see the patient being anticoagulated. There was no mention of atrial fibrillation on history and physical, subsequent progress notes by both resident and cardiology team. No other evidence of atrial fibrillation other than EKG done prior to admission. I am unsure if this patient actually does have atrial fibrillation. Patient will follow up with heart failure clinic later this afternoon, and in the meantime we'll continue current therapy.

## 2011-05-13 NOTE — Progress Notes (Signed)
This is a 45 year old gentleman presenting as a new patient evaluation. He was referred for unilateral leg swelling. However, this referral was made prior to the patient being hospitalized for congestive heart failure with massive volume overload. He has been aggressively diuresed in the hospital and his leg swelling has resolved. He has undergone venous duplex scanning which was negative bilaterally for deep venous thrombosis. I briefly examined the patient and he has palpable pulses in his feet. He has no claudication symptoms. His leg swelling is related to heart failure. He has followup in the heart failure clinic tomorrow. He was not charged for this office visit. I would be happy to see him in the future if he develops problems with peripheral vascular disease.

## 2011-05-14 ENCOUNTER — Ambulatory Visit (HOSPITAL_COMMUNITY)
Admission: RE | Admit: 2011-05-14 | Discharge: 2011-05-14 | Disposition: A | Discharge status: Home / Self Care | Payer: Medicaid Other | Source: Ambulatory Visit | Attending: Internal Medicine | Admitting: Internal Medicine

## 2011-05-14 VITALS — BP 88/62 | HR 86 | Wt 302.2 lb

## 2011-05-14 DIAGNOSIS — I5022 Chronic systolic (congestive) heart failure: Secondary | ICD-10-CM | POA: Diagnosis present

## 2011-05-14 LAB — COMPREHENSIVE METABOLIC PANEL
ALT: 25 U/L (ref 0–53)
AST: 26 U/L (ref 0–37)
Calcium: 10.4 mg/dL (ref 8.4–10.5)
GFR calc Af Amer: 90 mL/min — ABNORMAL LOW (ref >90)
Sodium: 141 mEq/L (ref 135–145)
Total Protein: 9 g/dL — ABNORMAL HIGH (ref 6.0–8.3)

## 2011-05-14 NOTE — Progress Notes (Signed)
Encounter addended by: Dolores Patty, MD on: 05/14/2011  5:07 PM<BR>     Documentation filed: Follow-up Section, LOS Section

## 2011-05-14 NOTE — Assessment & Plan Note (Addendum)
NYHA III. Volume status stable. ACE inhibitor reduced slightly due to soft BP. Continue current medicines.  Lengthy discussion with William Davila and his Mom about his condition.  Discussed potential advanced heart therapies. Reinforced the need to take all medications, weigh daily, limit fluid intake to 2 liters per day. . Check BMET today. Follow up 2-3 weeks.   Patient seen and examined with Tonye Becket, NP. We discussed all aspects of the encounter. I agree with the assessment and plan as stated above.   Total MD time spent = 40 mins with over 70% of that time dedicated to counseling and discussions described above with Dr. Gala Romney.

## 2011-05-14 NOTE — Progress Notes (Signed)
Patient ID: ABBIE JABLON, male   DOB: November 28, 1965, 45 y.o.   MRN: [id number]  HPI: Mr Faulkenberry  is a 45 year old AA male with systolic heart failure (EF10-20), cardiolmyopathy diagnosed 2001, hyperlipidemia, HTN, and obesity.  Discharged from [facility] 05/10/2011 Discharge weight 328.  Massive volume over load and low output. Cardiac output increased inotropes. Medications initiated ace, beta blocker , digoxin, and torsemide.   11/29 Lisinopril decreased 20 mg daily by clinic docotor.   He is her for post hospitalization follow up. Breathing better. Denies SOB/PND/Orthopnea. Uses crutches to walk due to quadricep tear. He is to be evaluated by PT.  Lives with grandparents.  Mom present and he has not been weighing. Tries to follow low salt diet.Taking all medications. He does not weigh daily.   ROS: All systems negative except as listed in HPI, PMH and Problem List.  Past Medical History  Diagnosis Date  . Chronic systolic heart failure     Diagnosed 2001; prior patient of Dr. Mendel Ryder with [facility] - discharged from practice  . Dilated cardiomyopathy     LVEF 10-20%  . Hyperlipidemia   . Essential hypertension, benign   . Morbid obesity     Current Outpatient Prescriptions  Medication Sig Dispense Refill  . carvedilol (COREG) 3.125 MG tablet Take 1 tablet (3.125 mg total) by mouth 2 (two) times daily with a meal.  60 tablet  6  . digoxin (LANOXIN) 0.25 MG tablet Take 1 tablet (0.25 mg total) by mouth daily.  30 tablet  6  . HYDROcodone-acetaminophen (VICODIN) 5-500 MG per tablet Take 2 tablets by mouth every 6 (six) hours as needed for pain.  30 tablet  0  . lisinopril (PRINIVIL,ZESTRIL) 20 MG tablet Take 1 tablet (20 mg total) by mouth daily.  30 tablet  6  . potassium chloride (K-DUR) 10 MEQ tablet Take 2 tablets (20 mEq total) by mouth 2 (two) times daily.  10 tablet  0  . spironolactone (ALDACTONE) 25 MG tablet Take 1 tablet (25 mg total) by mouth daily.  30 tablet  6  . torsemide (DEMADEX) 20  MG tablet Take 3 tablets (60 mg total) by mouth daily.  90 tablet  6     PHYSICAL EXAM: Filed Vitals:   05/14/11 1005  BP: 88/62  Pulse: 86   Weight change:  General:  Well appearing. No resp difficulty HEENT: normal Neck: supple. JVP 5-6. Carotids 2+ bilaterally; no bruits. No lymphadenopathy or thryomegaly appreciated. Cor: PMI normal. Regular rate & rhythm. No rubs, S3  or murmurs. Lungs: clear Abdomen: soft, nontender, nondistended. No hepatosplenomegaly. No bruits or masses. Good bowel sounds. Extremities: no cyanosis, clubbing, rash, edema Neuro: alert & orientedx3, cranial nerves grossly intact. Moves all 4 extremities w/o difficulty. Affect pleasant.      ASSESSMENT & PLAN:

## 2011-05-14 NOTE — Patient Instructions (Signed)
Weigh yourself EVERY morning after you go to the bathroom but before you eat or drink anything. Write this number down in a weight log/diary. If you gain 3 pounds overnight or 5 pounds in a week, call the heart failure clinic  Stay physically active! Staying active will give you more energy and make your muscles stronger. Start with 5 minutes at a time and work your way up to 30 minutes a day. Break up your activities--do some in the morning and some in the afternoon. Start with 3 days per week and work your way up to 5 days as you can.  If you have chest pain, feel short of breath, dizzy, or lightheaded, STOP. If you don't feel better after a short rest, call 911. If you do feel better, call the heart failure clinic to let them know you have symptoms with exercise.  Restrict your sodium intake to less than 2000mg  per day. This will help prevent your body from holding onto fluid. Read food labels as a lot of canned and packaged foods have a lot of sodium.  Limit your fluid intake to less than 2 liters of fluid per day. Fluid includes all drinks, coffee, juice, ice chips, soup, jello, and all other liquids.  Follow up in 2-3 weeks

## 2011-05-14 NOTE — Progress Notes (Signed)
Patient seen and examined with Amy Clegg, NP. We discussed all aspects of the encounter. I agree with the assessment and plan as stated below.   

## 2011-05-19 ENCOUNTER — Other Ambulatory Visit: Payer: Self-pay | Admitting: *Deleted

## 2011-05-19 ENCOUNTER — Telehealth (HOSPITAL_COMMUNITY): Payer: Self-pay | Admitting: *Deleted

## 2011-05-19 DIAGNOSIS — I420 Dilated cardiomyopathy: Secondary | ICD-10-CM

## 2011-05-19 NOTE — Telephone Encounter (Signed)
William Davila called this am, her sons left arm is swelling and he is in a lot of pain. She would like a call back.

## 2011-05-19 NOTE — Telephone Encounter (Deleted)
Pt has appt 12/8 at 1015 dr patel for wrist pain

## 2011-05-19 NOTE — Telephone Encounter (Signed)
I do not know the patient and am not the primary care. Please forward this request to the primary care.

## 2011-05-19 NOTE — Telephone Encounter (Signed)
It shows as a refill request and that's why I said that before I see him, I won't be comfortable doing it.  I would be more than happy to evaluate and refill during the office visit.  Prabhav Faulkenberry.

## 2011-05-19 NOTE — Telephone Encounter (Signed)
Pt has appt 12/10 at 1015 w/ dr patel per sharonb. For L wrist pain, pt also states he wants something stronger than vicodin

## 2011-05-19 NOTE — Telephone Encounter (Signed)
Pt's pcp is not in clinic and pt states he cannot wait to see pcp so he is given an appt w/ one of the clinic md's

## 2011-05-19 NOTE — Telephone Encounter (Signed)
Pt states his wrist is swollen and very painful, he has a history of gout but is not on any medication currently, advised pt to call pcp for meds he is agreeable

## 2011-05-21 ENCOUNTER — Emergency Department (INDEPENDENT_AMBULATORY_CARE_PROVIDER_SITE_OTHER): Payer: Self-pay

## 2011-05-21 ENCOUNTER — Encounter (HOSPITAL_COMMUNITY): Payer: Self-pay | Admitting: *Deleted

## 2011-05-21 ENCOUNTER — Emergency Department (INDEPENDENT_AMBULATORY_CARE_PROVIDER_SITE_OTHER)
Admission: EM | Admit: 2011-05-21 | Discharge: 2011-05-21 | Disposition: A | Discharge status: Home / Self Care | Payer: Self-pay | Source: Home / Self Care | Attending: Emergency Medicine | Admitting: Emergency Medicine

## 2011-05-21 DIAGNOSIS — M25539 Pain in unspecified wrist: Secondary | ICD-10-CM

## 2011-05-21 DIAGNOSIS — M25532 Pain in left wrist: Secondary | ICD-10-CM

## 2011-05-21 MED ORDER — MELOXICAM 7.5 MG PO TABS: 7.5000 mg | ORAL_TABLET | Freq: Every day | ORAL | Status: DC

## 2011-05-21 NOTE — ED Provider Notes (Signed)
History     CSN: [id number] Arrival date & time: 05/21/2011  1:11 PM   First MD Initiated Contact with Patient 05/21/11 1215      Chief Complaint  Patient presents with  . Wrist Pain    (Consider location/radiation/quality/duration/timing/severity/associated sxs/prior treatment) HPI Comments: Monday started with pain and swelling around my left writs, dont recall any falls or injuries, it was more swollen, swelling looks better today"..  No injury, no redness, No fevers  Patient is a 45 y.o. male presenting with wrist pain.  Wrist Pain This is a new problem. The current episode started more than 1 week ago. The problem occurs constantly. The problem has not changed since onset.The symptoms are aggravated by twisting and bending. The symptoms are relieved by position. Treatments tried: wrapped on ace-wrapp on ooxycodone chronically. The treatment provided no relief.    Past Medical History  Diagnosis Date  . Chronic systolic heart failure     Diagnosed 2001; prior patient of Dr. Mendel Ryder with [facility] - discharged from practice  . Dilated cardiomyopathy     LVEF 10-20%  . Hyperlipidemia   . Essential hypertension, benign   . Morbid obesity     Past Surgical History  Procedure Date  . No past surgeries     Family History  Problem Relation Age of Onset  . Hypertension Mother     History  Substance Use Topics  . Smoking status: Never Smoker   . Smokeless tobacco: Never Used  . Alcohol Use: No      Review of Systems  Constitutional: Negative for fever.  Musculoskeletal: Positive for joint swelling.  Skin: Negative.   Neurological: Negative for weakness and numbness.    Allergies  Beta adrenergic blockers and Bidil  Home Medications   Current Outpatient Rx  Name Route Sig Dispense Refill  . CARVEDILOL 3.125 MG PO TABS Oral Take 1 tablet (3.125 mg total) by mouth 2 (two) times daily with a meal. 60 tablet 6  . DIGOXIN 0.25 MG PO TABS Oral Take 1 tablet (0.25  mg total) by mouth daily. 30 tablet 6  . HYDROCODONE-ACETAMINOPHEN 5-500 MG PO TABS Oral Take 2 tablets by mouth every 6 (six) hours as needed for pain. 30 tablet 0  . LISINOPRIL 20 MG PO TABS Oral Take 1 tablet (20 mg total) by mouth daily. 30 tablet 6    This is a Month's supply  . POTASSIUM CHLORIDE CR 10 MEQ PO TBCR Oral Take 2 tablets (20 mEq total) by mouth 2 (two) times daily. 10 tablet 0  . SPIRONOLACTONE 25 MG PO TABS Oral Take 1 tablet (25 mg total) by mouth daily. 30 tablet 6  . TORSEMIDE 20 MG PO TABS Oral Take 3 tablets (60 mg total) by mouth daily. 90 tablet 6    This is a Location manager.    Pulse 75  Temp(Src) 98.9 F (37.2 C) (Oral)  Resp 18  SpO2 99%  Physical Exam  Nursing note and vitals reviewed. Constitutional: He appears well-developed and well-nourished.  HENT:  Head: Normocephalic.  Musculoskeletal: He exhibits tenderness. He exhibits no edema.       Left wrist: He exhibits decreased range of motion, tenderness and swelling. He exhibits no deformity.  Neurological: He is alert. He has normal strength. No sensory deficit.  Skin: Skin is warm. No erythema.    ED Course  Procedures (including critical care time)  Labs Reviewed - No data to display No results found.   No diagnosis found.  MDM  Non-traumatic wrist pain x 7 days. Exam denotes no obvious abnormalities somewhat disproportionate pain to mild superficial digital palpation.        Jimmie Molly, MD 05/21/11 1352

## 2011-05-21 NOTE — ED Notes (Signed)
Pt is here with complaints of left wrist swelling and pain with onset Monday.  No known injury.

## 2011-05-24 ENCOUNTER — Encounter: Payer: Self-pay | Admitting: Internal Medicine

## 2011-05-24 ENCOUNTER — Ambulatory Visit (INDEPENDENT_AMBULATORY_CARE_PROVIDER_SITE_OTHER): Payer: Self-pay | Admitting: Internal Medicine

## 2011-05-24 DIAGNOSIS — M109 Gout, unspecified: Secondary | ICD-10-CM

## 2011-05-24 DIAGNOSIS — I5022 Chronic systolic (congestive) heart failure: Secondary | ICD-10-CM

## 2011-05-24 DIAGNOSIS — IMO0002 Reserved for concepts with insufficient information to code with codable children: Secondary | ICD-10-CM

## 2011-05-24 NOTE — Patient Instructions (Addendum)
Please keep that appointment with Dr. Berlinda Last on 06/16/2010. For your wrist pain- continue taking Motrin which helped you- but don't take it longer than 2 weeks. Also for your and nasal stuffiness- take Benadryl or Claritin over-the-counter to help decrease the secretions. If you have any cough with sputum, fever, chills, increased shortness of breath- give [place] a call to make an early appointment. Take all medications regularly and check weight daily.

## 2011-05-24 NOTE — Assessment & Plan Note (Signed)
Left wrist pain seems to be secondary to an acute gout attack- which is in remission phase now. He is responding to Motrin- and so it supports anti-inflammatory pathology. He did not want any stronger pain medications as it makes him sleepy. I discussed with him about further management and he said that he would continue taking Motrin and will call if pain gets worse.

## 2011-05-24 NOTE — Progress Notes (Signed)
  Subjective:    Patient ID: William Davila, male    DOB: 06-17-1965, 45 y.o.   MRN: [id number]  HPI William Davila is a pleasant 45 year old man with past with history of severe dilated cardiomyopathy- with EF of 10-20%, moderate obesity, gout who comes the clinic for left wrist pain for a week and nasal congestion for a day.  On last Monday- he started having left wrist pain with swelling. Pain was sharp and deep continuous, 10 of 10 at its worse, going up in the forearm. Activity make the pain worse. Nothing made it better.   He was not doing any strenuous physical activity with his wrist when he started having pain- but reports that the pain and swelling kept on Getting Worse for next few days. He took Mobic without any help. Also took Vicodin which didn't help. Finally he went to urgent care on Friday- and after that started taking Motrin- which apparently took the swelling down and helped the pain. He feels much better today and has no apparent left wrist swelling or redness or significant tenderness.  He also complains of nasal congestion since yesterday morning. His dad was having some upper respiratory infection. He has to blow his nose all the time-especially with eating- since yesterday- but denies any post nasal drip, fever, chills, eye redness, sore throat, short of breath, chest pain, cough.    Review of Systems    as per history of present illness, all other systems reviewed and negative. Objective:   Physical Exam  Vitals: Reviewed. General: NAD. HEENT: PERRL, EOMI, no scleral icterus Cardiac: S1, S2, RRR, no murmurs Pulm: clear to auscultation bilaterally, moving normal volumes of air Abd: soft, nontender, nondistended, BS present Ext: 1+ pedal edema on left side. Left wrist no significant swelling, tenderness, redness. Neuro: alert and oriented X3, cranial nerves II-XII grossly intact, strength and sensation to light touch equal in bilateral upper and lower  extremities       Assessment & Plan:

## 2011-05-24 NOTE — Assessment & Plan Note (Signed)
Followed by Dr. Gala Romney - who saw him on 05/14/2011. Next appointment with him on 06/03/2011. Recent admission for CHF exacerbation- at baseline now. No significant crackles on exam and pedal edema is better than before. He checks his weight daily as instructed. Recently started on torsemide.

## 2011-05-24 NOTE — Assessment & Plan Note (Signed)
Patient actually has soft blood pressure lately due to his severe dilated cardiomyopathy. Continue heart failure medications.

## 2011-05-27 ENCOUNTER — Telehealth (HOSPITAL_COMMUNITY): Payer: Self-pay | Admitting: *Deleted

## 2011-05-27 NOTE — Telephone Encounter (Signed)
William Davila called regarding her son, William Davila, she is concerned about his low b/p and lack of energy and would like a call from you to see if there is any changes in his medications that can help him.  Thanks

## 2011-05-28 ENCOUNTER — Other Ambulatory Visit (HOSPITAL_COMMUNITY): Payer: Self-pay | Admitting: Adult Health

## 2011-05-28 MED ORDER — LISINOPRIL 20 MG PO TABS: 10.0000 mg | ORAL_TABLET | Freq: Every day | ORAL | Status: DC

## 2011-05-28 NOTE — Telephone Encounter (Signed)
BP running low, mom is concerned b/c she states that he just has no energy and sleeps a lot, BP runs 88-86 at appointments, he does not check it at home, she reports his wt is doing well and stable, will discuss w/MD and call her back

## 2011-05-28 NOTE — Telephone Encounter (Signed)
Asked Mrs Cast to decrease Romone Shaff lisinopril to 10 mg daily. Mrs Raether verbalized understanding.

## 2011-06-01 ENCOUNTER — Encounter (HOSPITAL_COMMUNITY): Payer: Self-pay | Admitting: *Deleted

## 2011-06-01 ENCOUNTER — Emergency Department (HOSPITAL_COMMUNITY): Payer: Medicaid Other

## 2011-06-01 ENCOUNTER — Emergency Department (HOSPITAL_COMMUNITY): Admission: EM | Admit: 2011-06-01 | Discharge: 2011-06-01 | Payer: Self-pay | Source: Home / Self Care

## 2011-06-01 ENCOUNTER — Inpatient Hospital Stay (HOSPITAL_COMMUNITY)
Admission: EM | Admit: 2011-06-01 | Discharge: 2011-06-04 | DRG: 683 | Disposition: A | Discharge status: Home / Self Care | Payer: Medicaid Other | Source: Ambulatory Visit | Attending: Internal Medicine | Admitting: Internal Medicine

## 2011-06-01 ENCOUNTER — Other Ambulatory Visit: Payer: Self-pay

## 2011-06-01 DIAGNOSIS — N19 Unspecified kidney failure: Secondary | ICD-10-CM

## 2011-06-01 DIAGNOSIS — I4891 Unspecified atrial fibrillation: Secondary | ICD-10-CM | POA: Insufficient documentation

## 2011-06-01 DIAGNOSIS — N179 Acute kidney failure, unspecified: Secondary | ICD-10-CM | POA: Diagnosis not present

## 2011-06-01 DIAGNOSIS — I472 Ventricular tachycardia, unspecified: Secondary | ICD-10-CM | POA: Diagnosis present

## 2011-06-01 DIAGNOSIS — I509 Heart failure, unspecified: Secondary | ICD-10-CM | POA: Diagnosis present

## 2011-06-01 DIAGNOSIS — I428 Other cardiomyopathies: Secondary | ICD-10-CM | POA: Diagnosis present

## 2011-06-01 DIAGNOSIS — I5042 Chronic combined systolic (congestive) and diastolic (congestive) heart failure: Secondary | ICD-10-CM | POA: Diagnosis present

## 2011-06-01 DIAGNOSIS — M109 Gout, unspecified: Secondary | ICD-10-CM | POA: Diagnosis present

## 2011-06-01 DIAGNOSIS — I4729 Other ventricular tachycardia: Secondary | ICD-10-CM | POA: Diagnosis present

## 2011-06-01 DIAGNOSIS — D72829 Elevated white blood cell count, unspecified: Secondary | ICD-10-CM | POA: Diagnosis present

## 2011-06-01 DIAGNOSIS — I420 Dilated cardiomyopathy: Secondary | ICD-10-CM

## 2011-06-01 DIAGNOSIS — I5022 Chronic systolic (congestive) heart failure: Secondary | ICD-10-CM

## 2011-06-01 DIAGNOSIS — I959 Hypotension, unspecified: Secondary | ICD-10-CM | POA: Diagnosis present

## 2011-06-01 DIAGNOSIS — E790 Hyperuricemia without signs of inflammatory arthritis and tophaceous disease: Secondary | ICD-10-CM | POA: Insufficient documentation

## 2011-06-01 LAB — BASIC METABOLIC PANEL
BUN: 103 mg/dL — ABNORMAL HIGH (ref 6–23)
Calcium: 11.1 mg/dL — ABNORMAL HIGH (ref 8.4–10.5)
Creatinine, Ser: 4.63 mg/dL — ABNORMAL HIGH (ref 0.50–1.35)
GFR calc Af Amer: 16 mL/min — ABNORMAL LOW (ref >90)
GFR calc non Af Amer: 14 mL/min — ABNORMAL LOW (ref >90)

## 2011-06-01 LAB — COMPREHENSIVE METABOLIC PANEL
Albumin: 4.1 g/dL (ref 3.5–5.2)
Alkaline Phosphatase: 203 U/L — ABNORMAL HIGH (ref 39–117)
BUN: 106 mg/dL — ABNORMAL HIGH (ref 6–23)
Chloride: 92 mEq/L — ABNORMAL LOW (ref 96–112)
Creatinine, Ser: 4.82 mg/dL — ABNORMAL HIGH (ref 0.50–1.35)
GFR calc Af Amer: 16 mL/min — ABNORMAL LOW (ref >90)
GFR calc non Af Amer: 14 mL/min — ABNORMAL LOW (ref >90)
Glucose, Bld: 96 mg/dL (ref 70–99)
Total Bilirubin: 1.1 mg/dL (ref 0.3–1.2)

## 2011-06-01 LAB — DIGOXIN LEVEL: Digoxin Level: 1.7 ng/mL (ref 0.8–2.0)

## 2011-06-01 LAB — DIFFERENTIAL
Basophils Relative: 0 % (ref 0–1)
Lymphs Abs: 1 10*3/uL (ref 0.7–4.0)
Monocytes Absolute: 0.6 10*3/uL (ref 0.1–1.0)
Monocytes Relative: 5 % (ref 3–12)
Neutro Abs: 10.5 10*3/uL — ABNORMAL HIGH (ref 1.7–7.7)

## 2011-06-01 LAB — TROPONIN I: Troponin I: <0.3 ng/mL (ref <0.30)

## 2011-06-01 LAB — CBC
HCT: 50.8 % (ref 39.0–52.0)
Hemoglobin: 18.2 g/dL — ABNORMAL HIGH (ref 13.0–17.0)
MCH: 28 pg (ref 26.0–34.0)
MCHC: 35.8 g/dL (ref 30.0–36.0)
RBC: 6.49 MIL/uL — ABNORMAL HIGH (ref 4.22–5.81)

## 2011-06-01 MED ORDER — HEPARIN SODIUM (PORCINE) 5000 UNIT/ML IJ SOLN
5000.0000 [IU] | Freq: Three times a day (TID) | INTRAMUSCULAR | Status: DC
  Administered 2011-06-01 – 2011-06-03 (×7): 5000 [IU] via SUBCUTANEOUS
  Filled 2011-06-01 (×11): qty 1

## 2011-06-01 MED ORDER — DIPHENHYDRAMINE HCL 25 MG PO TABS
25.0000 mg | ORAL_TABLET | Freq: Four times a day (QID) | ORAL | Status: DC | PRN
  Filled 2011-06-01: qty 1

## 2011-06-01 MED ORDER — DIGOXIN 250 MCG PO TABS: 0.2500 mg | ORAL_TABLET | Freq: Every day | ORAL | Status: DC

## 2011-06-01 MED ORDER — ACETAMINOPHEN 650 MG RE SUPP: 650.0000 mg | Freq: Four times a day (QID) | RECTAL | Status: DC | PRN

## 2011-06-01 MED ORDER — ONDANSETRON HCL 4 MG PO TABS: 4.0000 mg | ORAL_TABLET | Freq: Four times a day (QID) | ORAL | Status: DC | PRN

## 2011-06-01 MED ORDER — SODIUM CHLORIDE 0.9 % IV SOLN
INTRAVENOUS | Status: DC
  Administered 2011-06-01: 15:00:00 via INTRAVENOUS

## 2011-06-01 MED ORDER — ACETAMINOPHEN 325 MG PO TABS
650.0000 mg | ORAL_TABLET | Freq: Four times a day (QID) | ORAL | Status: DC | PRN
  Administered 2011-06-02 (×2): 650 mg via ORAL
  Filled 2011-06-01 (×2): qty 2

## 2011-06-01 MED ORDER — ONDANSETRON HCL 4 MG/2ML IJ SOLN: 4.0000 mg | Freq: Four times a day (QID) | INTRAMUSCULAR | Status: DC | PRN

## 2011-06-01 MED ORDER — SODIUM CHLORIDE 0.9 % IV SOLN
INTRAVENOUS | Status: DC
  Administered 2011-06-01 – 2011-06-02 (×3): via INTRAVENOUS

## 2011-06-01 NOTE — ED Notes (Signed)
Pt reports nausea and vomitting beginning this am; denies abd pain at this time; further reports sinus pressure and congestion x several days; denies productive cough

## 2011-06-01 NOTE — ED Notes (Signed)
Report to [facility] RN, awaiting arrival

## 2011-06-01 NOTE — H&P (Signed)
Hospital Admission Note Date: 06/01/2011  Patient name: William Davila Medical record number: [id number] Date of birth: 28-Apr-1966 Age: 45 y.o. Gender: male PCP: Kathreen Cosier, MD, MD  Medical Service: Internal Medicine Teaching Service  Attending physician:  Tilford Pillar    1st Contact: Margorie John  Pager: [phone] 2nd Contact: Johnette Abraham  Pager: [phone] After 5 pm or weekends: 1st Contact:      Pager: [phone] 2nd Contact:      Pager: [phone]  Chief Complaint: Vomiting, AKI  History of Present Illness: The patient is a 45 yo man, history of dilated CM (EF 10-20%), HTN, and HL, presenting with a 1-day history of vomiting, and with a creatinine of 4.82.  The patient notes a 1-week history of rhinorrhea, and smelling "an ammonia smell", which he attributed to a sinus infection, and 2 episodes of Natthew Marlatt non-bloody emesis this morning, prompting him to seek medical attention at [facility] ED today.  On presentation to [facility], he was noted to have a creatinine of 4.82, elevated from 1.14 three weeks prior.  He notes poor PO intake recently, eating nothing on the day of admission, and only some cereal on the day prior to admission due to nausea.  He also notes increased NSAID use recently for an acute gout flare, taking Motrin 4 tabs/day, starting 11 days PTA, ending 3-4 days PTA.  He notes some lightheadedness when standing from a seated position over the last 2-3 days, and some chills though no measured fevers.  He notes no cough, sore throat, ear pain, sinus pain, headache, fevers, myalgias, cp, SOB, LE edema, abdominal pain, or diarrhea/constipation.  He has a history of dilated CM, with an EF of 10-20%, and reports taking his medications regularly, though he did not take his medications on the day of admission or the day prior to admission.    Meds: Medications Prior to Admission  Medication Sig Dispense Refill  . carvedilol (COREG) 3.125 MG tablet Take 1 tablet (3.125 mg total) by  mouth 2 (two) times daily with a meal.  60 tablet  6  . digoxin (LANOXIN) 0.25 MG tablet Take 1 tablet (0.25 mg total) by mouth daily.  30 tablet  6  . lisinopril (PRINIVIL,ZESTRIL) 20 MG tablet Take 0.5 tablets (10 mg total) by mouth daily.  30 tablet  6  . potassium chloride (K-DUR) 10 MEQ tablet Take 2 tablets (20 mEq total) by mouth 2 (two) times daily.  10 tablet  0  . spironolactone (ALDACTONE) 25 MG tablet Take 1 tablet (25 mg total) by mouth daily.  30 tablet  6  . torsemide (DEMADEX) 20 MG tablet Take 3 tablets (60 mg total) by mouth daily.  90 tablet  6    Allergies: Beta adrenergic blockers - fatigue, increased dyspnea Bidil - headache  Past Medical History  Diagnosis Date  . Chronic systolic heart failure     Diagnosed 2001; prior patient of Dr. Mendel Ryder with [facility] - discharged from practice.  Now sees Dr. Gala Romney.  . Dilated cardiomyopathy     LVEF 10-20%  . Hyperlipidemia   . Essential hypertension, benign   . Morbid obesity   History of afib with RVR  Past Surgical History  Procedure Date  . No past surgeries    Family History  Problem Relation Age of Onset  . Hypertension Mother    History   Social History  . Marital Status: Single    Spouse Name: N/A    Number of Children: N/A  .  Years of Education: N/A   Social History Main Topics  . Smoking status: Never Smoker   . Smokeless tobacco: Never Used  . Alcohol Use: No  . Drug Use: No  . Sexually Active: No   Social History Narrative  . Stopped working 2-3 years ago due to symptoms of heart failure.  Has tried (unsuccessfully) applying for disability, medicare, and medicaid in the past.  Currently lives with his grandparents.    Review of Systems: General: no fevers, chills, changes in weight Skin: no rash HEENT: no blurry vision, hearing changes, sore throat Pulm: no dyspnea, coughing, wheezing CV: no chest pain, palpitations, shortness of breath Abd: no abdominal pain,  diarrhea/constipation GU: no dysuria, hematuria, polyuria Ext: no arthralgias, myalgias Neuro: no weakness, numbness, or tingling   Physical Exam: Blood pressure 107/65, pulse 76, temperature 97.6 F (36.4 C), temperature source Oral, resp. rate 13, SpO2 100.00%. General: alert, cooperative, and in no apparent distress HEENT: pupils equal round and reactive to light, vision grossly intact, oropharynx clear and non-erythematous  Neck: supple, no lymphadenopathy, no JVD Lungs: clear to ascultation bilaterally, normal work of respiration, no wheezes, rales, ronchi Heart: regular rate and rhythm, no murmurs, gallops, or rubs Abdomen: soft, non-tender, non-distended, normal bowel sounds Extremities: no cyanosis, clubbing, or edema Neurologic: alert & oriented X3, cranial nerves II-XII intact, strength grossly intact, sensation intact to light touch  Lab results: Basic Metabolic Panel:  Basename 06/01/11 1710 06/01/11 1520  NA 136 130*  K 4.9 6.1*  CL 97 92*  CO2 22 21  GLUCOSE 89 96  BUN 103* 106*  CREATININE 4.63* 4.82*  CALCIUM 11.1* 11.2*  MG -- --  PHOS -- --   Liver Function Tests:  Basename 06/01/11 1520  AST 38*  ALT 44  ALKPHOS 203*  BILITOT 1.1  PROT 9.8*  ALBUMIN 4.1   CBC:  Basename 06/01/11 1520  WBC 12.1*  NEUTROABS 10.5*  HGB 18.2*  HCT 50.8  MCV 78.3  PLT 448*   Cardiac Enzymes:  Basename 06/01/11 1520  CKTOTAL --  CKMB --  CKMBINDEX --  TROPONINI <0.30   Imaging results:  Dg Chest 2 View  06/01/2011  *RADIOLOGY REPORT*  Clinical Data: Hypotension.  CHEST - 2 VIEW  Comparison: 05/01/2011  Findings: Stable substantial cardiomegaly.  There remains interstitial and pulmonary venous prominence without overt airspace edema or pleural fluid.  IMPRESSION: Stable cardiomegaly and changes consistent with pulmonary venous hypertension/interstitial edema.  Original Report Authenticated By: Reola Calkins, M.D.   Echocardiogram 04/29/11 Study  Conclusions - Left ventricle: The cavity size was severely dilated. Wall thickness was increased in a pattern of moderate LVH. Systolic function was severely reduced. The estimated ejection fraction was in the range of 10% to 20%. Doppler parameters are consistent with an irreversible restrictive pattern, indicative of decreased left ventricular diastolic compliance and/or increased left atrial pressure (grade 4 diastolic dysfunction). Doppler parameters are consistent with high ventricular filling pressure. - Mitral valve: Mild to moderate regurgitation. - Left atrium: The atrium was severely dilated. - Right ventricle: The cavity size was mildly dilated. Systolic function was mildly reduced. Systolic pressure was mildly to moderately increased. - Right atrium: The atrium was mildly dilated. - Atrial septum: No defect or patent foramen ovale was identified. - Tricuspid valve: Moderate-severe regurgitation. - Pulmonary arteries: PA peak pressure: 36mm Hg (S).  Other results: EKG:  NSR, 1st degree AV block, SRS' in V4 seen to a lesser extent in prior EKG's, TWI in inferior leads,  V5, V6, and aVR (new)  Assessment & Plan by Problem: The patient is a 45 yo man, history of dilated CM (EF 10-20%), HTN, and HL, presenting with a 1-day history of vomiting, found to have AKI.  1. Acute Kidney Injury - patient presents with Cr = 4.82, elevated from 1.14 three weeks earlier.  The cause of the patient's AKI may be prerenal given poor PO intake over the last 2 days, or may represent AIN given increased usage of NSAID's over the last week.  ATN is another possibility, as the patient presented with BP's in the 80's systolic, but recent office visit SBP's have been in the 80's-110's as well.  Intrinsic renal failure would be unlikely to develop to this degree over such a short time period.  The patient notes no difficulty voiding to suggest a post-renal cause of AKI.  The patient's vomiting  -NS given  at 125 cc/hr at [facility] x8 hrs, continued at 100 cc/hr at [facility], to avoid exacerbating CHF -repeat BMET in a.m. to assess the creatinine response to IVF -will likely consult renal in a.m. if creatinine has not improved. -will check UA, urine eosinophils, urine sodium, urine creatinine  2. Congestive Heart Failure - NHYA III, EF 10-20%, history of dilated CM, recently discharged 05/10/11 for CHF with volume overload s/p diuresis of nearly 30 pounds.  Patient is currently not volume overloaded on exam, though CXR shows some possible interstitial edema.  Lisinopril recently decreased to 10 mg/day due to hypotension on 05/27/11.  The patient's digoxin level is currently 1.7. -hold digoxin, check daily levels -gentle IVF for AKI -hold other CHF meds acutely given hypotension, dehydration  3. Hypotension - SBP in 80's initially, subsequently improved to 100's, asymptomatic.  Patient has a history of HTN -hold BP meds acutely  4.  Leukocytosis - WBC = 12.1, with increased ANC.  Patient currently afebrile, though he does report symptoms of rhinorrhea.  Likely represents hemoconcentration, given concomitant increase in hemoglobin.  Other possibilities include acute infection, such as acute sinusitis (given rhinorrhea) vs acute gastroenteritis (given nausea, vomiting).  PNA unlikely given CXR findings. -repeat cbc in am  5. Gout - history of gout, recent wrist flare treated with motrin, currently resolved -asymptomatic  6. Prophy - Heparin   Signed: Janalyn Harder 06/01/2011, 10:51 PM

## 2011-06-01 NOTE — ED Provider Notes (Signed)
History     CSN: [id number] Arrival date & time: 06/01/2011  2:31 PM   First MD Initiated Contact with Patient 06/01/11 1504      Chief Complaint  Patient presents with  . Sinusitis    pt c/o ammonia smell in nostrils.   . Emesis    pt also reports "spitting out blood." x's 1 today.     (Consider location/radiation/quality/duration/timing/severity/associated sxs/prior treatment) Patient is a 45 y.o. male presenting with sinusitis and vomiting. The history is provided by the patient.  Sinusitis  This is a new problem. There has been no fever. The pain is mild. Associated symptoms include sinus pressure. Pertinent negatives include no chills, no cough and no shortness of breath.  Emesis  Pertinent negatives include no abdominal pain, no chills, no cough and no fever.   patient states she's felt bad today. He's felt some blood states she's also had a nosebleed. He vomited a couple times yesterday. He states that his head feels congested he smells ammonia. No chest pain. He states he does not feel dizzy. His blood pressures low air but he says it always runs low since he just has his medicine. No fevers. No cough. No diarrhea. No other bleeding.  Past Medical History  Diagnosis Date  . Chronic systolic heart failure     Diagnosed 2001; prior patient of Dr. Mendel Ryder with [facility] - discharged from practice  . Dilated cardiomyopathy     LVEF 10-20%  . Hyperlipidemia   . Essential hypertension, benign   . Morbid obesity     Past Surgical History  Procedure Date  . No past surgeries     Family History  Problem Relation Age of Onset  . Hypertension Mother     History  Substance Use Topics  . Smoking status: Never Smoker   . Smokeless tobacco: Never Used  . Alcohol Use: No      Review of Systems  Constitutional: Positive for fatigue. Negative for fever and chills.  HENT: Positive for nosebleeds and sinus pressure.   Eyes: Negative for pain.  Respiratory: Negative for  cough, choking and shortness of breath.   Cardiovascular: Negative for chest pain and leg swelling.  Gastrointestinal: Positive for vomiting. Negative for abdominal pain.  Genitourinary: Negative for hematuria.  Musculoskeletal: Negative for back pain.  Neurological: Negative for light-headedness.    Allergies  Beta adrenergic blockers and Bidil  Home Medications   Current Outpatient Rx  Name Route Sig Dispense Refill  . CARVEDILOL 3.125 MG PO TABS Oral Take 1 tablet (3.125 mg total) by mouth 2 (two) times daily with a meal. 60 tablet 6  . DIGOXIN 0.25 MG PO TABS Oral Take 1 tablet (0.25 mg total) by mouth daily. 30 tablet 6  . DIPHENHYDRAMINE HCL 25 MG PO TABS Oral Take 25 mg by mouth every 6 (six) hours as needed. For congestion     . LISINOPRIL 20 MG PO TABS Oral Take 0.5 tablets (10 mg total) by mouth daily. 30 tablet 6    This is a Month's supply  . POTASSIUM CHLORIDE CR 10 MEQ PO TBCR Oral Take 2 tablets (20 mEq total) by mouth 2 (two) times daily. 10 tablet 0  . SPIRONOLACTONE 25 MG PO TABS Oral Take 1 tablet (25 mg total) by mouth daily. 30 tablet 6  . TORSEMIDE 20 MG PO TABS Oral Take 3 tablets (60 mg total) by mouth daily. 90 tablet 6    This is a Location manager.  BP 113/60  Pulse 78  Temp(Src) 97.6 F (36.4 C) (Oral)  Resp 17  SpO2 100%  Physical Exam  Nursing note and vitals reviewed. Constitutional: He is oriented to person, place, and time. He appears well-developed and well-nourished.       Obese  HENT:  Head: Normocephalic and atraumatic.       Patients area of apparent recent bleeding in his left naris. No active bleeding.  Eyes: EOM are normal. Pupils are equal, round, and reactive to light.  Neck: Normal range of motion. Neck supple.  Cardiovascular: Normal rate, regular rhythm and normal heart sounds.   No murmur heard. Pulmonary/Chest: Effort normal and breath sounds normal.  Abdominal: Soft. Bowel sounds are normal. He exhibits no distension and no  mass. There is no tenderness. There is no rebound and no guarding.  Musculoskeletal: Normal range of motion. He exhibits no edema.  Neurological: He is alert and oriented to person, place, and time. No cranial nerve deficit.  Skin: Skin is warm and dry.  Psychiatric: He has a normal mood and affect.    ED Course  Procedures (including critical care time)  Labs Reviewed  CBC - Abnormal; Notable for the following:    WBC 12.1 (*)    RBC 6.49 (*)    Hemoglobin 18.2 (*)    RDW 16.4 (*)    Platelets 448 (*)    All other components within normal limits  DIFFERENTIAL - Abnormal; Notable for the following:    Neutrophils Relative 86 (*)    Neutro Abs 10.5 (*)    Lymphocytes Relative 9 (*)    All other components within normal limits  COMPREHENSIVE METABOLIC PANEL - Abnormal; Notable for the following:    Sodium 130 (*)    Potassium 6.1 (*) MODERATE HEMOLYSIS   Chloride 92 (*)    BUN 106 (*)    Creatinine, Ser 4.82 (*)    Calcium 11.2 (*)    Total Protein 9.8 (*)    AST 38 (*) MODERATE HEMOLYSIS   Alkaline Phosphatase 203 (*)    GFR calc non Af Amer 14 (*)    GFR calc Af Amer 16 (*)    All other components within normal limits  BASIC METABOLIC PANEL - Abnormal; Notable for the following:    BUN 103 (*)    Creatinine, Ser 4.63 (*)    Calcium 11.1 (*)    GFR calc non Af Amer 14 (*)    GFR calc Af Amer 16 (*)    All other components within normal limits  TROPONIN I  DIGOXIN LEVEL   Dg Chest 2 View  06/01/2011  *RADIOLOGY REPORT*  Clinical Data: Hypotension.  CHEST - 2 VIEW  Comparison: 05/01/2011  Findings: Stable substantial cardiomegaly.  There remains interstitial and pulmonary venous prominence without overt airspace edema or pleural fluid.  IMPRESSION: Stable cardiomegaly and changes consistent with pulmonary venous hypertension/interstitial edema.  Original Report Authenticated By: Reola Calkins, M.D.     1. Renal failure   2. Chronic systolic heart failure       Date: 06/01/2011  Rate: 78  Rhythm: normal sinus rhythm  QRS Axis: normal  Intervals: PR prolonged  ST/T Wave abnormalities: nonspecific ST/T changes  Conduction Disutrbances:none  Narrative Interpretation: non-specific ST/T changes laterally  Old EKG Reviewed: changes noted    MDM  Patient is here with likely epistaxis. He was initially hypotensive, but his blood pressure has improved. His previous ER visits show hypotension. He is a new  onset renal failure. His x-ray shows mild CHF. His initial potassium was elevated, but this is likely due to the hemolysis. Recheck did not show the elevation. His primary care Dr. is in the outpatient clinic. He'll be transferred to [facility] for further treatment        Juliet Rude. Rubin Payor, MD 06/01/11 [phone]

## 2011-06-02 ENCOUNTER — Other Ambulatory Visit: Payer: Self-pay

## 2011-06-02 ENCOUNTER — Encounter (HOSPITAL_COMMUNITY): Payer: Self-pay | Admitting: General Practice

## 2011-06-02 DIAGNOSIS — I5022 Chronic systolic (congestive) heart failure: Secondary | ICD-10-CM

## 2011-06-02 DIAGNOSIS — N19 Unspecified kidney failure: Secondary | ICD-10-CM

## 2011-06-02 DIAGNOSIS — N179 Acute kidney failure, unspecified: Secondary | ICD-10-CM | POA: Diagnosis present

## 2011-06-02 LAB — CREATININE, URINE, RANDOM: Creatinine, Urine: 136.45 mg/dL

## 2011-06-02 LAB — URINALYSIS, ROUTINE W REFLEX MICROSCOPIC
Bilirubin Urine: NEGATIVE
Ketones, ur: NEGATIVE mg/dL
Nitrite: NEGATIVE
Protein, ur: NEGATIVE mg/dL
pH: 5 (ref 5.0–8.0)

## 2011-06-02 LAB — BASIC METABOLIC PANEL
Chloride: 99 mEq/L (ref 96–112)
Creatinine, Ser: 3.35 mg/dL — ABNORMAL HIGH (ref 0.50–1.35)
GFR calc Af Amer: 24 mL/min — ABNORMAL LOW (ref >90)
Potassium: 4.5 mEq/L (ref 3.5–5.1)
Sodium: 136 mEq/L (ref 135–145)

## 2011-06-02 LAB — MAGNESIUM: Magnesium: 2.3 mg/dL (ref 1.5–2.5)

## 2011-06-02 LAB — PHOSPHORUS: Phosphorus: 4.2 mg/dL (ref 2.3–4.6)

## 2011-06-02 MED ORDER — GUAIFENESIN ER 600 MG PO TB12
600.0000 mg | ORAL_TABLET | Freq: Two times a day (BID) | ORAL | Status: DC
  Administered 2011-06-02 – 2011-06-04 (×5): 600 mg via ORAL
  Filled 2011-06-02 (×6): qty 1

## 2011-06-02 MED ORDER — PREDNISONE 50 MG PO TABS
60.0000 mg | ORAL_TABLET | Freq: Every day | ORAL | Status: DC
  Administered 2011-06-02 – 2011-06-04 (×3): 60 mg via ORAL
  Filled 2011-06-02 (×5): qty 1

## 2011-06-02 NOTE — Progress Notes (Addendum)
CARE MANAGEMENT NOTE HEART FAILURE  06/02/2011   Patient:  William Davila, William Davila   Account Number:  [id number]    Date Initiated:  06/02/2011  Documentation initiated by:  Shannan Harper  Subjective/Objective Assessment:   Patient admitted with hx of CHF and a one day history of vomiting and a creatinine of 4.82.   Watching patient in relationship to volume status as it is connected with CHF.   Action/Plan:   Anticipate discharge back to home with self care.  Patient seems to be managing his CHF without any complaint at this time.  He lives with family in a home and was independent prior to admit.   Anticipated DC Date:  06/05/2011  Anticipated DC Plan:  HOME W HOME HEALTH SERVICES  DC Planning Services:  CM consult    [facility] Choice:  HOME HEALTH   Choice offered to / List presented to:  C-1 Patient    HH arranged:  HH-1 RN  HH-10 DISEASE MANAGEMENT     HH agency:  [facility]    Status of service:  In process, will continue to follow  Medicare Important Message Given:   (If response is "NO", the following Medicare IM given date fields will be blank) Date Medicare IM Given:   Date Additional Medicare IM Given:    Discharge Disposition:  HOME W HOME HEALTH SERVICES  Per UR Regulation:  Reviewed for med. necessity/level of care/duration of stay  Comments:   UR Completed. 06/02/11 1015 Shannan Harper, RN, BSN  Met with patient and parents to discuss support measures at discharge.  Discussed at length the heart failure packet, daily weights, patient states he has a scale at home, zone material, salt intake and fluids.  His parents feel that a HHRN would be beneficial for support even though he is a self pay.  His mother did say he has applied for medicaid already and is waiting for approval.  William Davila agreed to [facility] to come and discuss payment options for him.  Referral made to [facility] and [facility] notified.  Referral placed in [facility]. Order placed on sticky note portion of EPIC.   Also discussed his being a part of the medical clinics here at the hospital.  He stated he had received medications before when asked by the NCM.  His mother said it has been very helpful.  I spoke with Sabino Niemann and is familiar with the patient who has received 34 day supply of medications in the past.  She will visit the patient tomorrow to determine if he is appropriate for continued supply.  Will continue to assist.  06/02/11 1629 Shannan Harper, RN, BSN   Initial CM contact:     By:   Initial CSW contact:     By:      Is this an INP Readmission < 30 days:  Y (If "YES" please see readmission information at the bottom of note)  Patient living status prior to this admission:  FAMILY  Patient setting prior to this admission:  HOME  Comorbid conditions being treated that contributed to this admission:  HIGH-CHF, AFIB, EF 10-20%, CM, HTN  CHF Readmission Risk:  high      Was referral made to [facility]:  N  Is the patient's PCP the same as attending:  N PCP:    Readmission < 30 Days If pt has HH, did they contact the agency before going to the ED:   Name of [facility] agency:    Was the follow-up  physician visit scheduled prior to discharge:  Y  Did the patient follow-up with the physician prior to this readmission:  Y  Was there HF Clinic visits prior to readmission:  Y  Were there ED visits between admissions:  N  Readmit type:  Unscheduled/Unrelated  If unscheduled and related indicate reason for readmit:

## 2011-06-02 NOTE — Progress Notes (Signed)
UR Completed.  William Davila 06/02/2011 [phone]

## 2011-06-02 NOTE — H&P (Signed)
Internal Medicine Teaching Service Attending Note Date: 06/02/2011  Patient name: William Davila  Medical record number: [id number]  Date of birth: [birth year]   I have seen and evaluated William Davila and discussed their care with the Residency Team.   Patient says his weight was 302 at heart failure clinic 05/27/11 and it is measured at 281 her. He is still having a lot of pain in his left wrist that he thinks is gout. No history of DM.   Physical Exam: Blood pressure 94/56, pulse 86, temperature 97.8 F (36.6 C), temperature source Oral, resp. rate 19, height 5\' 10"  (1.778 m), weight 281 lb 4.9 oz (127.6 kg), SpO2 94.00%. In NAD Lungs-clear Heart-RRR Abdom-+BS, NT Extrem-no edema, very tender and diffusely swollen left wrist Neuro-nonfocal  Lab results: Results for orders placed during the hospital encounter of 06/01/11 (from the past 24 hour(s))  CBC     Status: Abnormal   Collection Time   06/01/11  3:20 PM      Component Value Range   WBC 12.1 (*) 4.0 - 10.5 (K/uL)   RBC 6.49 (*) 4.22 - 5.81 (MIL/uL)   Hemoglobin 18.2 (*) 13.0 - 17.0 (g/dL)   HCT 40.9  81.1 - 91.4 (%)   MCV 78.3  78.0 - 100.0 (fL)   MCH 28.0  26.0 - 34.0 (pg)   MCHC 35.8  30.0 - 36.0 (g/dL)   RDW 78.2 (*) 95.6 - 15.5 (%)   Platelets 448 (*) 150 - 400 (K/uL)  DIFFERENTIAL     Status: Abnormal   Collection Time   06/01/11  3:20 PM      Component Value Range   Neutrophils Relative 86 (*) 43 - 77 (%)   Neutro Abs 10.5 (*) 1.7 - 7.7 (K/uL)   Lymphocytes Relative 9 (*) 12 - 46 (%)   Lymphs Abs 1.0  0.7 - 4.0 (K/uL)   Monocytes Relative 5  3 - 12 (%)   Monocytes Absolute 0.6  0.1 - 1.0 (K/uL)   Eosinophils Relative 0  0 - 5 (%)   Eosinophils Absolute 0.0  0.0 - 0.7 (K/uL)   Basophils Relative 0  0 - 1 (%)   Basophils Absolute 0.0  0.0 - 0.1 (K/uL)  COMPREHENSIVE METABOLIC PANEL     Status: Abnormal   Collection Time   06/01/11  3:20 PM      Component Value Range   Sodium 130 (*) 135 - 145 (mEq/L)   Potassium 6.1 (*) 3.5 - 5.1 (mEq/L)   Chloride 92 (*) 96 - 112 (mEq/L)   CO2 21  19 - 32 (mEq/L)   Glucose, Bld 96  70 - 99 (mg/dL)   BUN 213 (*) 6 - 23 (mg/dL)   Creatinine, Ser 0.86 (*) 0.50 - 1.35 (mg/dL)   Calcium 57.8 (*) 8.4 - 10.5 (mg/dL)   Total Protein 9.8 (*) 6.0 - 8.3 (g/dL)   Albumin 4.1  3.5 - 5.2 (g/dL)   AST 38 (*) 0 - 37 (U/L)   ALT 44  0 - 53 (U/L)   Alkaline Phosphatase 203 (*) 39 - 117 (U/L)   Total Bilirubin 1.1  0.3 - 1.2 (mg/dL)   GFR calc non Af Amer 14 (*) >90 (mL/min)   GFR calc Af Amer 16 (*) >90 (mL/min)  TROPONIN I     Status: Normal   Collection Time   06/01/11  3:20 PM      Component Value Range   Troponin I <0.30  <0.30 (ng/mL)  DIGOXIN LEVEL     Status: Normal   Collection Time   06/01/11  3:20 PM      Component Value Range   Digoxin Level 1.7  0.8 - 2.0 (ng/mL)  BASIC METABOLIC PANEL     Status: Abnormal   Collection Time   06/01/11  5:10 PM      Component Value Range   Sodium 136  135 - 145 (mEq/L)   Potassium 4.9  3.5 - 5.1 (mEq/L)   Chloride 97  96 - 112 (mEq/L)   CO2 22  19 - 32 (mEq/L)   Glucose, Bld 89  70 - 99 (mg/dL)   BUN 865 (*) 6 - 23 (mg/dL)   Creatinine, Ser 7.84 (*) 0.50 - 1.35 (mg/dL)   Calcium 69.6 (*) 8.4 - 10.5 (mg/dL)   GFR calc non Af Amer 14 (*) >90 (mL/min)   GFR calc Af Amer 16 (*) >90 (mL/min)  PHOSPHORUS     Status: Normal   Collection Time   06/02/11 12:09 AM      Component Value Range   Phosphorus 4.2  2.3 - 4.6 (mg/dL)  MAGNESIUM     Status: Normal   Collection Time   06/02/11 12:09 AM      Component Value Range   Magnesium 2.3  1.5 - 2.5 (mg/dL)  SODIUM, URINE, RANDOM     Status: Normal   Collection Time   06/02/11  1:40 AM      Component Value Range   Sodium, Ur 36    CREATININE, URINE, RANDOM     Status: Normal   Collection Time   06/02/11  1:40 AM      Component Value Range   Creatinine, Urine 136.45    URINALYSIS, ROUTINE W REFLEX MICROSCOPIC     Status: Normal   Collection Time   06/02/11   1:40 AM      Component Value Range   Color, Urine YELLOW  YELLOW    APPearance CLEAR  CLEAR    Specific Gravity, Urine 1.014  1.005 - 1.030    pH 5.0  5.0 - 8.0    Glucose, UA NEGATIVE  NEGATIVE (mg/dL)   Hgb urine dipstick NEGATIVE  NEGATIVE    Bilirubin Urine NEGATIVE  NEGATIVE    Ketones, ur NEGATIVE  NEGATIVE (mg/dL)   Protein, ur NEGATIVE  NEGATIVE (mg/dL)   Urobilinogen, UA 0.2  0.0 - 1.0 (mg/dL)   Nitrite NEGATIVE  NEGATIVE    Leukocytes, UA NEGATIVE  NEGATIVE   BASIC METABOLIC PANEL     Status: Abnormal   Collection Time   06/02/11  5:25 AM      Component Value Range   Sodium 136  135 - 145 (mEq/L)   Potassium 4.5  3.5 - 5.1 (mEq/L)   Chloride 99  96 - 112 (mEq/L)   CO2 19  19 - 32 (mEq/L)   Glucose, Bld 95  70 - 99 (mg/dL)   BUN 90 (*) 6 - 23 (mg/dL)   Creatinine, Ser 2.95 (*) 0.50 - 1.35 (mg/dL)   Calcium 28.4 (*) 8.4 - 10.5 (mg/dL)   GFR calc non Af Amer 21 (*) >90 (mL/min)   GFR calc Af Amer 24 (*) >90 (mL/min)    Imaging results:  Dg Chest 2 View  06/01/2011  *RADIOLOGY REPORT*  Clinical Data: Hypotension.  CHEST - 2 VIEW  Comparison: 05/01/2011  Findings: Stable substantial cardiomegaly.  There remains interstitial and pulmonary venous prominence without overt airspace edema or pleural fluid.  IMPRESSION: Stable cardiomegaly and changes consistent with pulmonary venous hypertension/interstitial edema.  Original Report Authenticated By: Reola Calkins, M.D.    Assessment and Plan: I agree with the formulated Assessment and Plan with the following changes:  Patient's weight is down 20 pounds from what it was 6 days ago in clinic if he is remembering correctly and he definitely seems dry. Will treat his gout with prednisone. Thankfully his Cr is down to 3.35 this AM.

## 2011-06-02 NOTE — Plan of Care (Signed)
Problem: Phase I Progression Outcomes Goal: EF % per last Echo/documented,Core Reminder form on chart Outcome: Completed/Met Date Met:  06/02/11 EF 10-20% from echo on 04/29/11

## 2011-06-02 NOTE — Progress Notes (Signed)
Advanced Heart Failure Rounding Note   Subjective:    [name] was admitted for acute renal failure with initial Cr 4.83.  After hydration Cr improving to 3.3 today.  His HF meds are currently on hold due to hypotension.    Currently he has no c/o SOB/orthopnea/PND.  Wrist does not hurt right now.  No dizziness.   Objective:    Vital Signs:   Temp:  [97.6 F (36.4 C)-98 F (36.7 C)] 97.8 F (36.6 C) (12/19 1500) Pulse Rate:  [40-103] 99  (12/19 1500) Resp:  [13-21] 18  (12/19 1500) BP: (94-119)/(55-78) 111/76 mmHg (12/19 1500) SpO2:  [93 %-100 %] 96 % (12/19 1500) Weight:  [127.6 kg (281 lb 4.9 oz)] 281 lb 4.9 oz (127.6 kg) (12/19 0540)    24-hour weight change: Weight change:   Intake/Output:   Intake/Output Summary (Last 24 hours) at 06/02/11 1635 Last data filed at 06/02/11 1533  Gross per 24 hour  Intake   1420 ml  Output    650 ml  Net    770 ml     Physical Exam: General:  Well appearing. No resp difficulty HEENT: normal Neck: supple. JVP flat . Carotids 2+ bilat; no bruits. No lymphadenopathy or thryomegaly appreciated. Cor: PMI nondisplaced. Regular rate & rhythm. No rubs, gallops or murmurs. Lungs: clear Abdomen: soft, nontender, nondistended. No hepatosplenomegaly. No bruits or masses. Good bowel sounds. Extremities: no cyanosis, clubbing, rash, edema Neuro: alert & orientedx3, cranial nerves grossly intact. moves all 4 extremities w/o difficulty. Affect pleasant  Telemetry: SR 80s-90s  Labs: Basic Metabolic Panel:  Lab 06/02/11 9604 06/02/11 0009 06/01/11 1710 06/01/11 1520  NA 136 -- 136 130*  K 4.5 -- 4.9 6.1*  CL 99 -- 97 92*  CO2 19 -- 22 21  GLUCOSE 95 -- 89 96  BUN 90* -- 103* 106*  CREATININE 3.35* -- 4.63* 4.82*  CALCIUM 10.7* -- 11.1* 11.2*  MG -- 2.3 -- --  PHOS -- 4.2 -- --    Liver Function Tests:  Lab 06/01/11 1520  AST 38*  ALT 44  ALKPHOS 203*  BILITOT 1.1  PROT 9.8*  ALBUMIN 4.1   No results found for this basename:  LIPASE:5,AMYLASE:5 in the last 168 hours No results found for this basename: AMMONIA:3 in the last 168 hours  CBC:  Lab 06/01/11 1520  WBC 12.1*  NEUTROABS 10.5*  HGB 18.2*  HCT 50.8  MCV 78.3  PLT 448*    Cardiac Enzymes:  Lab 06/01/11 1520  CKTOTAL --  CKMB --  CKMBINDEX --  TROPONINI <0.30    BNP: No components found with this basename: POCBNP:5  CBG: No results found for this basename: GLUCAP:5 in the last 168 hours  Coagulation Studies: No results found for this basename: LABPROT:5,INR:5 in the last 72 hours   Imaging: Dg Chest 2 View  06/01/2011  *RADIOLOGY REPORT*  Clinical Data: Hypotension.  CHEST - 2 VIEW  Comparison: 05/01/2011  Findings: Stable substantial cardiomegaly.  There remains interstitial and pulmonary venous prominence without overt airspace edema or pleural fluid.  IMPRESSION: Stable cardiomegaly and changes consistent with pulmonary venous hypertension/interstitial edema.  Original Report Authenticated By: Reola Calkins, M.D.      Medications:     Scheduled Medications:    . guaiFENesin  600 mg Oral BID  . heparin  5,000 Units Subcutaneous Q8H  . predniSONE  60 mg Oral Q breakfast  . DISCONTD: digoxin  0.25 mg Oral Daily  Infusions:    . sodium chloride 75 mL/hr at 06/02/11 1610  . DISCONTD: sodium chloride 125 mL/hr at 06/01/11 1524     PRN Medications:  acetaminophen, acetaminophen, diphenhydrAMINE, ondansetron (ZOFRAN) IV, ondansetron   Assessment:   1) Acute renal failure 2) Hypotension 3) Chronic Systolic HF 4) Gout  Plan/Discussion:    Patient appears to be dry.  Agree with gentle hydration but would cut back fluids at this time so he does not become fluid overloaded.  Agree with holding medications.  Would restart coreg in the am if BP remains stable.  Hold lisinopril, spriro, lasix and dig at this time until Cr improved.  Suspect renal failure is prerenal and will continue to improve.     Length of  Stay: 1   Ulyess Blossom, [place]   06/02/2011, 4:35 PM  Patient seen and examined with Ulyess Blossom PA-C. We discussed all aspects of the encounter. I agree with the assessment and plan as stated above. Renal failure likely due to overdiuresis and NSAID use. Improving with IVF. Now that renal function improving would stop IVF and just continue to hold diuretics, ACE-I and spiro. Will likely be able to go home with close outpt f/u. Appreciate IM care.   William Buser,MD 5:15 PM

## 2011-06-02 NOTE — Progress Notes (Signed)
Subjective: Patient complain of left wrist pain that developed overnight  Objective: Vital signs in last 24 hours: Filed Vitals:   06/02/11 0029 06/02/11 0100 06/02/11 0527 06/02/11 0540  BP: 104/74  94/56   Pulse: 103  86   Temp:   97.8 F (36.6 C)   TempSrc:   Oral   Resp:   19   Height:  5\' 10"  (1.778 m)    Weight:    281 lb 4.9 oz (127.6 kg)  SpO2:   94%    Weight change:   Intake/Output Summary (Last 24 hours) at 06/02/11 1430 Last data filed at 06/02/11 0800  Gross per 24 hour  Intake   1060 ml  Output    650 ml  Net    410 ml   Physical Exam: General: middle aged man resting in bed, in mild distress HEENT: PERRL, EOMI, no scleral icterus Cardiac: RRR, no rubs, murmurs or gallops, soft heart sounds Pulm: clear to auscultation bilaterally, moving normal volumes of air Abd: soft, nontender, nondistended, BS present Ext: warm and well perfused, no pedal edema, tender to palpation of left wrist. Trace edema only in ankle. Neuro: alert and oriented X3, cranial nerves II-XII grossly intact  Lab Results: Basic Metabolic Panel:  Lab 06/02/11 7829 06/02/11 0009 06/01/11 1710  NA 136 -- 136  K 4.5 -- 4.9  CL 99 -- 97  CO2 19 -- 22  GLUCOSE 95 -- 89  BUN 90* -- 103*  CREATININE 3.35* -- 4.63*  CALCIUM 10.7* -- 11.1*  MG -- 2.3 --  PHOS -- 4.2 --   Liver Function Tests:  Lab 06/01/11 1520  AST 38*  ALT 44  ALKPHOS 203*  BILITOT 1.1  PROT 9.8*  ALBUMIN 4.1   CBC:  Lab 06/01/11 1520  WBC 12.1*  NEUTROABS 10.5*  HGB 18.2*  HCT 50.8  MCV 78.3  PLT 448*   Cardiac Enzymes:  Lab 06/01/11 1520  CKTOTAL --  CKMB --  CKMBINDEX --  TROPONINI <0.30   Urine Drug Screen: Drugs of Abuse     Component Value Date/Time   LABOPIA POSITIVE* 05/01/2011 2147   COCAINSCRNUR NONE DETECTED 05/01/2011 2147   LABBENZ NONE DETECTED 05/01/2011 2147   AMPHETMU NONE DETECTED 05/01/2011 2147   THCU NONE DETECTED 05/01/2011 2147   LABBARB NONE DETECTED 05/01/2011 2147      Micro Results: No results found for this or any previous visit (from the past 240 hour(s)). Studies/Results: Dg Chest 2 View  06/01/2011  *RADIOLOGY REPORT*  Clinical Data: Hypotension.  CHEST - 2 VIEW  Comparison: 05/01/2011  Findings: Stable substantial cardiomegaly.  There remains interstitial and pulmonary venous prominence without overt airspace edema or pleural fluid.  IMPRESSION: Stable cardiomegaly and changes consistent with pulmonary venous hypertension/interstitial edema.  Original Report Authenticated By: Reola Calkins, M.D.   Medications: I have reviewed the patient's current medications. Scheduled Meds:   . guaiFENesin  600 mg Oral BID  . heparin  5,000 Units Subcutaneous Q8H  . predniSONE  60 mg Oral Q breakfast  . DISCONTD: digoxin  0.25 mg Oral Daily   Continuous Infusions:   . sodium chloride 100 mL/hr at 06/02/11 0954  . 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   1. Acute Kidney  Injury - patient presents with Cr = 4.82, elevated from 1.14 three weeks earlier. The cause of the patient's AKI may be prerenal given poor PO intake over the last 2 days, or may represent AIN given increased usage of NSAID's over the last week. The patient notes no difficulty voiding to suggest a post-renal cause of AKI.   -NS given at 125 cc/hr at [facility] x8 hrs, continued at 75 cc/hr at [facility], to avoid exacerbating CHF  -creatinine has responded and come down more than a point overnight -FeNA is < 1% so likely prerenal cause  2. Congestive Heart Failure - NHYA III, EF 10-20%, history of dilated CM, recently discharged 05/10/11 for CHF with volume overload s/p diuresis of nearly 30 pounds. Patient is currently not volume  overloaded on exam, though CXR shows some possible interstitial edema. Lisinopril recently decreased to 10 mg/day due to hypotension on 05/27/11. The patient's digoxin level is currently 1.7.  -weight today is likely falsely low, patient was 308lbs at outpatient appt recently. This would be nearly 75 lb weight loss in total -hold digoxin, check daily levels  -gentle IVF for AKI  -hold other CHF meds acutely given hypotension, dehydration   3. Hypotension - SBP in 80's initially, subsequently improved to 100's, asymptomatic. Patient has a history of HTN  -hold BP meds acutely   4. Leukocytosis - WBC = 12.1, with increased ANC. Patient currently afebrile, though he does report symptoms of rhinorrhea. Likely represents hemoconcentration, given concomitant increase in hemoglobin. Other possibilities include acute infection, such as acute sinusitis (given rhinorrhea) vs acute gastroenteritis (given nausea, vomiting). PNA unlikely given CXR findings.  -repeat cbc in am -prescribed mucinex for congestion  5. Gout - history of gout, recent wrist flare treated with motrin, currently resolved  -will treat with 10 day prednisone taper (60mg  X2 days, 40mg  X3 days, 20mg  X 5 days)  6. Prophy - Heparin   LOS: 1 day   Margorie John 06/02/2011, 2:30 PM

## 2011-06-03 ENCOUNTER — Ambulatory Visit (HOSPITAL_COMMUNITY): Payer: Self-pay

## 2011-06-03 DIAGNOSIS — I5022 Chronic systolic (congestive) heart failure: Secondary | ICD-10-CM

## 2011-06-03 LAB — CBC
HCT: 47.3 % (ref 39.0–52.0)
MCV: 79.1 fL (ref 78.0–100.0)
RBC: 5.98 MIL/uL — ABNORMAL HIGH (ref 4.22–5.81)
RDW: 16.6 % — ABNORMAL HIGH (ref 11.5–15.5)
WBC: 11.3 10*3/uL — ABNORMAL HIGH (ref 4.0–10.5)

## 2011-06-03 LAB — BASIC METABOLIC PANEL
BUN: 64 mg/dL — ABNORMAL HIGH (ref 6–23)
CO2: 19 mEq/L (ref 19–32)
Chloride: 103 mEq/L (ref 96–112)
Creatinine, Ser: 2.04 mg/dL — ABNORMAL HIGH (ref 0.50–1.35)

## 2011-06-03 MED ORDER — CARVEDILOL 3.125 MG PO TABS
3.1250 mg | ORAL_TABLET | Freq: Two times a day (BID) | ORAL | Status: DC
  Administered 2011-06-03 – 2011-06-04 (×3): 3.125 mg via ORAL
  Filled 2011-06-03 (×5): qty 1

## 2011-06-03 MED ORDER — SODIUM CHLORIDE 0.9 % IV SOLN
INTRAVENOUS | Status: DC
  Administered 2011-06-03 (×2): via INTRAVENOUS

## 2011-06-03 NOTE — Progress Notes (Addendum)
Advanced Heart Failure Rounding Note   Subjective:    [name] was admitted for acute renal failure with initial Cr 4.83.  After hydration Cr improving to 3.3 today.  His HF meds are currently on hold due to hypotension.    Up 2 lbs.  Resting comfortably.  No SOB/orthopnea/PND   Objective:    Vital Signs:   Temp:  [97 F (36.1 C)-97.8 F (36.6 C)] 97 F (36.1 C) (12/20 0500) Pulse Rate:  [75-99] 75  (12/20 0500) Resp:  [18] 18  (12/20 0500) BP: (92-111)/(62-76) 100/66 mmHg (12/20 0500) SpO2:  [95 %-98 %] 95 % (12/20 0500) Weight:  [283 lb 8.2 oz (128.6 kg)] 283 lb 8.2 oz (128.6 kg) (12/20 0510) Last BM Date: 06/02/11  24-hour weight change: Weight change: 2 lb 3.3 oz (1 kg)  Intake/Output:   Intake/Output Summary (Last 24 hours) at 06/03/11 1127 Last data filed at 06/02/11 1834  Gross per 24 hour  Intake    720 ml  Output      0 ml  Net    720 ml     Physical Exam: General:  Well appearing. No resp difficulty HEENT: normal Neck: supple. JVP flat . Carotids 2+ bilat; no bruits. No lymphadenopathy or thryomegaly appreciated. Cor: PMI nondisplaced. Regular rate & rhythm. No rubs, gallops or murmurs. Lungs: clear Abdomen: soft, nontender, nondistended. No hepatosplenomegaly. No bruits or masses. Good bowel sounds. Extremities: no cyanosis, clubbing, rash, edema Neuro: alert & orientedx3, cranial nerves grossly intact. moves all 4 extremities w/o difficulty. Affect pleasant  Telemetry: SR 80s-90s  Labs: Basic Metabolic Panel:  Lab 06/03/11 7829 06/02/11 0525 06/02/11 0009 06/01/11 1710 06/01/11 1520  NA 137 136 -- 136 130*  K 4.1 4.5 -- 4.9 6.1*  CL 103 99 -- 97 92*  CO2 19 19 -- 22 21  GLUCOSE 106* 95 -- 89 96  BUN 64* 90* -- 103* 106*  CREATININE 2.04* 3.35* -- 4.63* 4.82*  CALCIUM 10.5 10.7* -- 11.1* --  MG -- -- 2.3 -- --  PHOS -- -- 4.2 -- --    Liver Function Tests:  Lab 06/01/11 1520  AST 38*  ALT 44  ALKPHOS 203*  BILITOT 1.1  PROT 9.8*    ALBUMIN 4.1   No results found for this basename: LIPASE:5,AMYLASE:5 in the last 168 hours No results found for this basename: AMMONIA:3 in the last 168 hours  CBC:  Lab 06/03/11 0645 06/01/11 1520  WBC 11.3* 12.1*  NEUTROABS -- 10.5*  HGB 16.8 18.2*  HCT 47.3 50.8  MCV 79.1 78.3  PLT 373 448*    Cardiac Enzymes:  Lab 06/01/11 1520  CKTOTAL --  CKMB --  CKMBINDEX --  TROPONINI <0.30    BNP: No components found with this basename: POCBNP:5  CBG: No results found for this basename: GLUCAP:5 in the last 168 hours  Coagulation Studies: No results found for this basename: LABPROT:5,INR:5 in the last 72 hours   Imaging: Dg Chest 2 View  06/01/2011  *RADIOLOGY REPORT*  Clinical Data: Hypotension.  CHEST - 2 VIEW  Comparison: 05/01/2011  Findings: Stable substantial cardiomegaly.  There remains interstitial and pulmonary venous prominence without overt airspace edema or pleural fluid.  IMPRESSION: Stable cardiomegaly and changes consistent with pulmonary venous hypertension/interstitial edema.  Original Report Authenticated By: Reola Calkins, M.D.     Medications:     Scheduled Medications:    . carvedilol  3.125 mg Oral BID WC  . guaiFENesin  600 mg  Oral BID  . heparin  5,000 Units Subcutaneous Q8H  . predniSONE  60 mg Oral Q breakfast    Infusions:    . sodium chloride 50 mL/hr at 06/03/11 1007  . DISCONTD: sodium chloride Stopped (06/02/11 1715)    PRN Medications: acetaminophen, acetaminophen, diphenhydrAMINE, ondansetron (ZOFRAN) IV, ondansetron   Assessment:   1) Acute renal failure 2) Hypotension 3) Chronic Systolic HF 4) Gout  Plan/Discussion:    Renal function continues to improve.  Will restart low dose coreg today.  Would also restart lisinopril 2.5 mg daily and demadex 20 mg daily tomorrow.  Hold spiro and digoxin until follow up on Monday at 8:15a with lab draw.  Remember to weigh daily.  Ok for discharge today and follow up on  Monday.  May need medication for gout as last rx he was unable to afford it, unsure what he was give.    Length of Stay: 2 Ulyess Blossom, PA-C  06/03/2011, 11:27 AM   Patient seen and examined with Ulyess Blossom PA-C. We discussed all aspects of the encounter. I agree with the assessment and plan as stated above. Looks much better. Renal function improved. BP stable. D/C IVF. Start back HF meds at reduced dose including low-dose demadex. Please do not send home without at least some diuretic.   Truman Hayward 6:13 PM

## 2011-06-03 NOTE — Progress Notes (Signed)
Subjective: Patient without complaints as ide from difficulty sleeping  Objective: Vital signs in last 24 hours: Filed Vitals:   06/02/11 2116 06/03/11 0500 06/03/11 0510 06/03/11 1459  BP: 92/62 100/66  112/72  Pulse: 86 75  82  Temp: 97.4 F (36.3 C) 97 F (36.1 C)  98.2 F (36.8 C)  TempSrc: Oral Oral  Oral  Resp: 18 18  18   Height:      Weight:   283 lb 8.2 oz (128.6 kg)   SpO2: 98% 95%  95%   Weight change: 2 lb 3.3 oz (1 kg)  Intake/Output Summary (Last 24 hours) at 06/03/11 1525 Last data filed at 06/03/11 1300  Gross per 24 hour  Intake   1440 ml  Output      0 ml  Net   1440 ml   Physical Exam: General: middle aged man resting in bed, in mild distress HEENT: PERRL, EOMI, no scleral icterus Cardiac: RRR, no rubs, murmurs or gallops, soft heart sounds Pulm: clear to auscultation bilaterally, moving normal volumes of air Abd: soft, nontender, nondistended, BS present Ext: warm and well perfused, no pedal edema, no tenderness to palpation of left wrist. Trace edema only in ankle. Neuro: alert and oriented X3, cranial nerves II-XII grossly intact  Lab Results: Basic Metabolic Panel:  Lab 06/03/11 1478 06/02/11 0525 06/02/11 0009  NA 137 136 --  K 4.1 4.5 --  CL 103 99 --  CO2 19 19 --  GLUCOSE 106* 95 --  BUN 64* 90* --  CREATININE 2.04* 3.35* --  CALCIUM 10.5 10.7* --  MG -- -- 2.3  PHOS -- -- 4.2   Liver Function Tests:  Lab 06/01/11 1520  AST 38*  ALT 44  ALKPHOS 203*  BILITOT 1.1  PROT 9.8*  ALBUMIN 4.1   CBC:  Lab 06/03/11 0645 06/01/11 1520  WBC 11.3* 12.1*  NEUTROABS -- 10.5*  HGB 16.8 18.2*  HCT 47.3 50.8  MCV 79.1 78.3  PLT 373 448*   Cardiac Enzymes:  Lab 06/01/11 1520  CKTOTAL --  CKMB --  CKMBINDEX --  TROPONINI <0.30   Urine Drug Screen: Drugs of Abuse     Component Value Date/Time   LABOPIA POSITIVE* 05/01/2011 2147   COCAINSCRNUR NONE DETECTED 05/01/2011 2147   LABBENZ NONE DETECTED 05/01/2011 2147   AMPHETMU  NONE DETECTED 05/01/2011 2147   THCU NONE DETECTED 05/01/2011 2147   LABBARB NONE DETECTED 05/01/2011 2147    Micro Results: No results found for this or any previous visit (from the past 240 hour(s)). Studies/Results: Dg Chest 2 View  06/01/2011  *RADIOLOGY REPORT*  Clinical Data: Hypotension.  CHEST - 2 VIEW  Comparison: 05/01/2011  Findings: Stable substantial cardiomegaly.  There remains interstitial and pulmonary venous prominence without overt airspace edema or pleural fluid.  IMPRESSION: Stable cardiomegaly and changes consistent with pulmonary venous hypertension/interstitial edema.  Original Report Authenticated By: Reola Calkins, M.D.   Medications: I have reviewed the patient's current medications. Scheduled Meds:    . carvedilol  3.125 mg Oral BID WC  . guaiFENesin  600 mg Oral BID  . heparin  5,000 Units Subcutaneous Q8H  . predniSONE  60 mg Oral Q breakfast   Continuous Infusions:    . sodium chloride 50 mL/hr at 06/03/11 1007  . 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   1. Acute Kidney Injury - patient presents with Cr = 4.82, elevated from 1.14 three weeks earlier. The cause of the patient's AKI may be prerenal given poor PO intake over the last 2 days, or may represent AIN given increased usage of NSAID's over the last week. The patient notes no difficulty voiding to suggest a post-renal cause of AKI.   -NS given at 125 cc/hr at [facility] x8 hrs, continued at 75 cc/hr at [facility], to avoid exacerbating CHF  -creatinine has responded and come down more than a point overnight to 2.0 -FeNA is < 1% so likely prerenal cause   2. Congestive Heart Failure - NHYA III, EF 10-20%, history of dilated CM, recently discharged  05/10/11 for CHF with volume overload s/p diuresis of nearly 30 pounds. Patient is currently not volume overloaded on exam, though CXR shows some possible interstitial edema. Lisinopril recently decreased to 10 mg/day due to hypotension on 05/27/11.  -weight today is 283, patient was 308lbs at outpatient appt recently. This would be nearly 75 lb weight loss in total -gentle IVF for AKI  -hold other CHF meds acutely given hypotension, dehydration  -plan for discharge tomorrow AM after restarting lisinopril & demadex  3. Hypotension - SBP in 80's initially, subsequently improved to 110's, asymptomatic. Patient has a history of HTN  -restarted coreg with no hypotension  4. Gout - history of gout, recent wrist flare treated with motrin, currently resolved  -will treat with 10 day prednisone taper (60mg  X2 days, 40mg  X3 days, 20mg  X 5 days) -can restart preventative medication as an outpatient though cost of colchcine? was prohibitive in the past  5. Prophy - Heparin   LOS: 2 days   Margorie John 06/03/2011, 3:25 PM

## 2011-06-03 NOTE — Progress Notes (Signed)
Internal Medicine Teaching Service Attending Note Date: 06/03/2011  Patient name: William Davila  Medical record number: [id number]  Date of birth: [birth year]    This patient has been seen and discussed with the house staff. Please see their note for complete details. I concur with their findings with the following additions/corrections:   Cr decreased to 2.0 today. Gout pain gone with prednisone. URI feeling better. Coreg restarted today. Will continue IVF at 50 ml/hr today and plan probable d/c tomorrow.  WOODYEAR,WYNNE E 06/03/2011, 11:36 AM

## 2011-06-04 DIAGNOSIS — J9600 Acute respiratory failure, unspecified whether with hypoxia or hypercapnia: Secondary | ICD-10-CM

## 2011-06-04 DIAGNOSIS — I472 Ventricular tachycardia: Secondary | ICD-10-CM

## 2011-06-04 LAB — BASIC METABOLIC PANEL
CO2: 20 mEq/L (ref 19–32)
Calcium: 10.3 mg/dL (ref 8.4–10.5)
Creatinine, Ser: 1.48 mg/dL — ABNORMAL HIGH (ref 0.50–1.35)
Glucose, Bld: 86 mg/dL (ref 70–99)

## 2011-06-04 MED ORDER — TORSEMIDE 20 MG PO TABS
20.0000 mg | ORAL_TABLET | Freq: Every day | ORAL | Status: DC
  Administered 2011-06-04: 20 mg via ORAL
  Filled 2011-06-04: qty 1

## 2011-06-04 MED ORDER — LISINOPRIL 2.5 MG PO TABS
2.5000 mg | ORAL_TABLET | Freq: Every day | ORAL | Status: DC
  Administered 2011-06-04: 2.5 mg via ORAL
  Filled 2011-06-04: qty 1

## 2011-06-04 MED ORDER — POTASSIUM CHLORIDE CRYS ER 20 MEQ PO TBCR
40.0000 meq | EXTENDED_RELEASE_TABLET | Freq: Once | ORAL | Status: AC
  Administered 2011-06-04: 40 meq via ORAL
  Filled 2011-06-04: qty 2

## 2011-06-04 MED ORDER — PREDNISONE 20 MG PO TABS: ORAL_TABLET | ORAL | Status: DC

## 2011-06-04 MED ORDER — LISINOPRIL 5 MG PO TABS: 5.0000 mg | ORAL_TABLET | Freq: Every day | ORAL | Status: DC

## 2011-06-04 MED ORDER — DIGOXIN 250 MCG PO TABS: 0.2500 mg | ORAL_TABLET | Freq: Every day | ORAL | Status: DC

## 2011-06-04 MED ORDER — POTASSIUM CHLORIDE 10 MEQ PO TBCR
20.0000 meq | EXTENDED_RELEASE_TABLET | Freq: Two times a day (BID) | ORAL | Status: DC
  Filled 2011-06-04 (×2): qty 2

## 2011-06-04 MED ORDER — LISINOPRIL 10 MG PO TABS: 10.0000 mg | ORAL_TABLET | Freq: Every day | ORAL | Status: DC

## 2011-06-04 NOTE — Progress Notes (Addendum)
At 0353 pt experienced 15 seconds of sustained Vtach. Pt was asleep and denies any symptoms and no signs of distress. When I reached pt, he was back in first degree heart block. Pt vitals stable at [address]773, 86, 98%RA. Informed resident on-call. This is pt first noted run of Vtach during this stay, usually first degree with bigeminy PVC's. No orders received. Educated pt on symptoms to call for. Call bell within reach. Will continue to monitor

## 2011-06-04 NOTE — Progress Notes (Signed)
CARE MANAGEMENT NOTE HEART FAILURE  06/04/2011   Patient:  William Davila, William Davila   Account Number:  [id number]    Date Initiated:  06/02/2011  Documentation initiated by:  Shannan Harper  Subjective/Objective Assessment:   Patient admitted with hx of CHF and a one day history of vomiting and a creatinine of 4.82.   Watching patient in relationship to volume status as it is connected with CHF.   Action/Plan:   Anticipate discharge back to home with self care.  Patient seems to be managing his CHF without any complaint at this time.  He lives with family in a home and was independent prior to admit.  06/04/2011 Order for Life Vest received.   Anticipated DC Date:  06/04/2011  Anticipated DC Plan:  HOME W HOME HEALTH SERVICES  DC Planning Services:  CM consult    [facility] Choice:  HOME HEALTH   Choice offered to / List presented to:  C-1 Patient DME arranged:  VEST - LIFE VEST     HH arranged:  HH-1 RN  HH-10 DISEASE MANAGEMENT     HH agency:  [facility]    Status of service:  Completed, signed off  Medicare Important Message Given:   (If response is "NO", the following Medicare IM given date fields will be blank) Date Medicare IM Given:   Date Additional Medicare IM Given:    Discharge Disposition:  HOME W HOME HEALTH SERVICES  Per UR Regulation:  Reviewed for med. necessity/level of care/duration of stay  Comments:   06/04/2011 Order for Life Vest noted, per notes Dr Teressa Lower has ordered, spoke with [place] rep with [employer] who has begun process of measuring pt. Paperwork for lease brought to hospital completed and signed by Wandra Mannan for [facility] , Interior and spatial designer of CSW. Copies made and orginal given back to [employer]. Shannan Harper notifed of completion of process. Johny Shock RN MPH Case Manager [phone]   UR Completed. 06/02/11 1015 Shannan Harper, RN, BSN  Met with patient and parents to discuss support measures at discharge.  Discussed at length  the heart failure packet, daily weights, patient states he has a scale at home, zone material, salt intake and fluids.  His parents feel that a HHRN would be beneficial for support even though he is a self pay.  His mother did say he has applied for medicaid already and is waiting for approval.  Mr. Wheller agreed to [facility] to come and discuss payment options for him.  Referral made to [facility] and [facility] notified.  Referral placed in [facility]. Order placed on sticky note portion of EPIC.  Also discussed his being a part of the medical clinics here at the hospital.  He stated he had received medications before when asked by the NCM.  His mother said it has been very helpful.  I spoke with Sabino Niemann and is familiar with the patient who has received 34 day supply of medications in the past.  She will visit the patient tomorrow to determine if he is appropriate for continued supply.  Will continue to assist.  06/02/11 1629 Shannan Harper, RN, BSN   Initial CM contact:  06/02/2011 12:00 M  By:  Shannan Harper Initial CSW contact:     By:      Is this an INP Readmission < 30 days:  Y (If "YES" please see readmission information at the bottom of note)  Patient living status prior to this admission:  FAMILY  Patient setting prior to this  admission:  HOME  Comorbid conditions being treated that contributed to this admission:  HIGH-CHF, AFIB, EF 10-20%, CM, HTN  CHF Readmission Risk:  high  Type of patient education provided  HF Patient Education Assessment / Teach Back  HF Zone Tool / Magnet  Limit salt intake  Weigh daily     Patient education provided by  [facility]    Was referral made to [facility]:  N  Is the patient's PCP the same as attending:  N PCP:    Readmission < 30 Days If pt has HH, did they contact the agency before going to the ED:   Name of [facility] agency:    Was the follow-up physician visit scheduled prior to discharge:  Y  Did the patient follow-up with the physician prior to this  readmission:  Y  Was there HF Clinic visits prior to readmission:  Y  Were there ED visits between admissions:  N  Readmit type:  Unscheduled/Unrelated  If unscheduled and related indicate reason for readmit:

## 2011-06-04 NOTE — Progress Notes (Signed)
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 Also with restrictive filling pattern. Has h/o recent noncompliance. He was admitted for acute renal failure with initial Cr 4.83 due to volume depletion in setting of viral illness and NSAID use.  After hydration Cr improving to 1.5 today.  His HF meds being restarted.  Last night 75 beat run of asx VT. K+ 3.9  Mag pending. Denies CP, SOB, palpitations or presyncope.    Objective:    Vital Signs:   Temp:  [97.7 F (36.5 C)-98.2 F (36.8 C)] 97.7 F (36.5 C) (12/20 2124) Pulse Rate:  [79-92] 92  (12/21 0717) Resp:  [18] 18  (12/21 0717) BP: (95-112)/(58-80) 109/78 mmHg (12/21 0717) SpO2:  [95 %-100 %] 100 % (12/21 0717) Weight:  [128.9 kg (284 lb 2.8 oz)] 284 lb 2.8 oz (128.9 kg) (12/21 0500) Last BM Date: 06/03/11  24-hour weight change: Weight change: 0.3 kg (10.6 oz)  Intake/Output:   Intake/Output Summary (Last 24 hours) at 06/04/11 0920 Last data filed at 06/04/11 0600  Gross per 24 hour  Intake 1954.17 ml  Output      0 ml  Net 1954.17 ml     Physical Exam: General:  Well appearing. No resp difficulty HEENT: normal Neck: supple. JVP flat . Carotids 2+ bilat; no bruits. No lymphadenopathy or thryomegaly appreciated. Cor: PMI nondisplaced. Regular rate & rhythm. No rubs, gallops or murmurs. Lungs: clear Abdomen: soft, nontender, nondistended. No hepatosplenomegaly. No bruits or masses. Good bowel sounds. Extremities: no cyanosis, clubbing, rash, edema Neuro: alert & orientedx3, cranial nerves grossly intact. moves all 4 extremities w/o difficulty. Affect pleasant  Telemetry: SR 80s-90s  Labs: Basic Metabolic Panel:  Lab 06/04/11 1610 06/03/11 0645 06/02/11 0525 06/02/11 0009 06/01/11 1710 06/01/11 1520  NA 134* 137 136 -- 136 130*  K 3.9 4.1 4.5 -- 4.9 6.1*  CL 103 103 99 -- 97 92*  CO2 20 19 19  -- 22 21  GLUCOSE 86 106* 95 -- 89 96  BUN 41*  64* 90* -- 103* 106*  CREATININE 1.48* 2.04* 3.35* -- 4.63* 4.82*  CALCIUM 10.3 10.5 10.7* -- -- --  MG -- -- -- 2.3 -- --  PHOS -- -- -- 4.2 -- --    Liver Function Tests:  Lab 06/01/11 1520  AST 38*  ALT 44  ALKPHOS 203*  BILITOT 1.1  PROT 9.8*  ALBUMIN 4.1   No results found for this basename: LIPASE:5,AMYLASE:5 in the last 168 hours No results found for this basename: AMMONIA:3 in the last 168 hours  CBC:  Lab 06/03/11 0645 06/01/11 1520  WBC 11.3* 12.1*  NEUTROABS -- 10.5*  HGB 16.8 18.2*  HCT 47.3 50.8  MCV 79.1 78.3  PLT 373 448*    Cardiac Enzymes:  Lab 06/01/11 1520  CKTOTAL --  CKMB --  CKMBINDEX --  TROPONINI <0.30    BNP: No components found with this basename: POCBNP:5  CBG: No results found for this basename: GLUCAP:5 in the last 168 hours  Coagulation Studies: No results found for this basename: LABPROT:5,INR:5 in the last 72 hours   Imaging: No results found.   Medications:     Scheduled Medications:    . carvedilol  3.125 mg Oral BID WC  . guaiFENesin  600 mg Oral BID  . heparin  5,000 Units Subcutaneous Q8H  . lisinopril  2.5 mg Oral Daily  .  predniSONE  60 mg Oral Q breakfast  . torsemide  20 mg Oral Daily    Infusions:    . DISCONTD: sodium chloride Stopped (06/04/11 0736)    PRN Medications: acetaminophen, acetaminophen, diphenhydrAMINE, ondansetron (ZOFRAN) IV, ondansetron   Assessment:   1) Acute renal failure 2) Hypotension 3) Chronic Systolic/Diastolic HF EF 15-20% 4) Gout 5) VT  Plan/Discussion:    Renal function continues to improve.  Had VT last night. Doesn't qualify for implantable ICD yet as he has had period of medication noncompliance. Have placed order for LifeVEst and hopefully he should get at 6pm tonight. Can go home after that. I explained to him the seriousness of this event and need for complete compliance with meds to qualify for ICDs. Supp k+ and check mag.   D/c home on Carvedilol  3.125 bid Lisinopril 5 mg daily Demadex 20 mg daily Kcl 20 meq daily Digoxin 0.25 daily  We will see him in clinic on Monday.   MD time 30 mins  Truman Hayward 9:31 AM    Truman Hayward 9:20 AM

## 2011-06-04 NOTE — Progress Notes (Addendum)
Received order for Life Vest for this pt. Call placed to Dr Gala Romney assistance, no response, call placed to [facility] rep, who referred this case manager to Dorothea Ogle [phone] @ 1155 and 1215 no response as yet. Call also placed to finance, per rep Medicaid application has been completed and sent. Await Medicaid pending number.  Will continue to try to reach Dr Gala Romney and/or assistance.  Johny Shock RN MPH Case manager [phone] Received return call from Harriet Butte and she will bring lease agreement papers to this case manager to fill out and will need to be signed by [facility] , CSW Director.  Await lease agreement.  Johny Shock RN MPH Case Manager [phone]

## 2011-06-04 NOTE — Progress Notes (Signed)
Spoke with patient and he has enough medications until his next clinic visit.  He will address further assistance with medications at that time.  Will continue to assist as needed. 06/04/11 1609 Shannan Harper, RN, BSN [phone]

## 2011-06-04 NOTE — Progress Notes (Signed)
Subjective: Patient had 15 second run of V tach overnight, was asymptomatic and sleeping. BP was 100s/80s, O2 sat 98.  Objective: Vital signs in last 24 hours: Filed Vitals:   06/04/11 0500 06/04/11 0714 06/04/11 0716 06/04/11 0717  BP:  112/80 108/77 109/78  Pulse:  82 90 92  Temp:      TempSrc:      Resp:  18 18 18   Height:      Weight: 284 lb 2.8 oz (128.9 kg)     SpO2:  96% 99% 100%   Weight change: 10.6 oz (0.3 kg)  Intake/Output Summary (Last 24 hours) at 06/04/11 1335 Last data filed at 06/04/11 1226  Gross per 24 hour  Intake 2314.17 ml  Output      0 ml  Net 2314.17 ml   Physical Exam: General: middle aged man resting in bed, in mild distress HEENT: PERRL, EOMI, no scleral icterus Cardiac: RRR, no rubs, murmurs or gallops, soft heart sounds Pulm: clear to auscultation bilaterally, moving normal volumes of air Abd: soft, nontender, nondistended, BS present Ext: warm and well perfused, no pedal edema, no tenderness to palpation of left wrist. Trace edema only in ankle. Neuro: alert and oriented X3, cranial nerves II-XII grossly intact  Lab Results: Basic Metabolic Panel:  Lab 06/04/11 1610 06/03/11 0645 06/02/11 0009  NA 134* 137 --  K 3.9 4.1 --  CL 103 103 --  CO2 20 19 --  GLUCOSE 86 106* --  BUN 41* 64* --  CREATININE 1.48* 2.04* --  CALCIUM 10.3 10.5 --  MG 1.9 -- 2.3  PHOS -- -- 4.2   Liver Function Tests:  Lab 06/01/11 1520  AST 38*  ALT 44  ALKPHOS 203*  BILITOT 1.1  PROT 9.8*  ALBUMIN 4.1   CBC:  Lab 06/03/11 0645 06/01/11 1520  WBC 11.3* 12.1*  NEUTROABS -- 10.5*  HGB 16.8 18.2*  HCT 47.3 50.8  MCV 79.1 78.3  PLT 373 448*   Cardiac Enzymes:  Lab 06/01/11 1520  CKTOTAL --  CKMB --  CKMBINDEX --  TROPONINI <0.30   Urine Drug Screen: Drugs of Abuse     Component Value Date/Time   LABOPIA POSITIVE* 05/01/2011 2147   COCAINSCRNUR NONE DETECTED 05/01/2011 2147   LABBENZ NONE DETECTED 05/01/2011 2147   AMPHETMU NONE  DETECTED 05/01/2011 2147   THCU NONE DETECTED 05/01/2011 2147   LABBARB NONE DETECTED 05/01/2011 2147    Micro Results: No results found for this or any previous visit (from the past 240 hour(s)). Studies/Results: No results found. Medications: I have reviewed the patient's current medications. Scheduled Meds:    . carvedilol  3.125 mg Oral BID WC  . digoxin  0.25 mg Oral Daily  . guaiFENesin  600 mg Oral BID  . heparin  5,000 Units Subcutaneous Q8H  . lisinopril  5 mg Oral Daily  . potassium chloride  20 mEq Oral BID  . potassium chloride  40 mEq Oral Once  . predniSONE  60 mg Oral Q breakfast  . torsemide  20 mg Oral Daily  . DISCONTD: lisinopril  10 mg Oral Daily  . DISCONTD: lisinopril  2.5 mg Oral Daily   Continuous Infusions:    . 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   1.  Acute Kidney Injury - patient presents with Cr = 4.82, elevated from 1.14 three weeks earlier. The cause of the patient's AKI may be prerenal given poor PO intake over the last 2 days, or may represent AIN given increased usage of NSAID's over the last week. The patient notes no difficulty voiding to suggest a post-renal cause of AKI.   -creatinine has responded and come down to near baseline, 1.5 -FeNA is < 1% so likely prerenal cause  2. Ventricular Tachycardia -patient has not had 3 months of medical treatment -will obtain life vest with help of care mangement  3. Congestive Heart Failure - NHYA III, EF 10-20%, history of dilated CM, recently discharged 05/10/11 for CHF with volume overload s/p diuresis of nearly 30 pounds. Patient is currently not volume overloaded on exam, though CXR shows some possible interstitial edema. Lisinopril  recently decreased to 10 mg/day due to hypotension on 05/27/11.  -weight today is 284, patient was 308lbs at outpatient appt recently.  -started demadex, lisinopril, coreg and digoxin -plan for discharge tomorrow after life vest given  4. Hypotension - SBP in 80's initially, subsequently improved to 110's, asymptomatic. Patient has a history of HTN  -restarted coreg with no hypotension  5. Gout - history of gout, recent wrist flare treated with motrin, currently resolved  -will treat with 10 day prednisone taper (60mg  X2 days, 40mg  X3 days, 20mg  X 5 days) -can restart preventative medication as an outpatient though cost of colchcine? was prohibitive in the past  6. Prophy - Heparin   LOS: 3 days   Margorie John 06/04/2011, 1:35 PM

## 2011-06-04 NOTE — Progress Notes (Signed)
Pending life vest and will d/c once it arrives.

## 2011-06-04 NOTE — Discharge Summary (Signed)
Internal Medicine Teaching [facility] Discharge Note  Name: William Davila MRN: [id number] DOB: Feb 23, 1966 45 y.o.  Date of Admission: 06/01/2011  2:31 PM Date of Discharge: 06/04/2011 Attending Physician: Zoila Shutter  Discharge Diagnosis: Principal Problem:  Acute kidney injury- likely due to dehydration and NSAID use Active Problems:  Gout attack- on prednisone taper, avoid NSAIDs due to AKI  Atrial fibrillation with RVR  Chronic systolic heart failure- on torsemide, lasix and lisinopril  Ventricular tachycardia (paroxysmal)- found on telemetry, had sustained16 second run, was aymptomatic  Discharge Medications: Current Discharge Medication List    START taking these medications   Details  predniSONE (DELTASONE) 20 MG tablet Please take 2 tablets daily for 3 days, then take 1 tablet daily for 4 days then STOP Qty: 10 tablet, Refills: 0      CONTINUE these medications which have CHANGED   Details  lisinopril (PRINIVIL,ZESTRIL) 5 MG tablet Take 1 tablet (5 mg total) by mouth daily. Qty: 30 tablet, Refills: 0    torsemide (DEMADEX) 20 MG tablet Take 1 tablets (20 mg total) by mouth daily. Qty: 90 tablet, Refills: 6      CONTINUE these medications which have NOT CHANGED   Details  carvedilol (COREG) 3.125 MG tablet Take 1 tablet (3.125 mg total) by mouth 2 (two) times daily with a meal. Qty: 60 tablet, Refills: 6    digoxin (LANOXIN) 0.25 MG tablet Take 1 tablet (0.25 mg total) by mouth daily. Qty: 30 tablet, Refills: 6    diphenhydrAMINE (BENADRYL) 25 MG tablet Take 25 mg by mouth every 6 (six) hours as needed. For congestion     potassium chloride (K-DUR) 10 MEQ tablet Take 2 tablets (20 mEq total) by mouth 2 (two) times daily. Qty: 10 tablet, Refills: 0   Associated Diagnoses: Dilated cardiomyopathy      STOP taking these medications     spironolactone (ALDACTONE) 25 MG tablet         Disposition and follow-up:   Mr.[name] was discharged  from [facility] in Good condition.    Follow-up Appointments: Follow-up Information    Follow up with Arvilla Meres, MD on 06/07/2011. (8:15 a )    Contact information:   [facility] [phone]        Discharge Orders    Future Appointments: Provider: Department: Dept Phone: Center:   06/07/2011 8:15 AM [facility] [phone] None   06/17/2011 1:30 PM Kathreen Cosier, MD [facility] [phone] [facility]     Future Orders Please Complete By Expires   Diet - low sodium heart healthy      Increase activity slowly      Discharge instructions      Comments:   Weight yourself DAILY or at least every other day.  Wear life vest at all times.   Call MD for:      Comments:   Chest pain, dizziness, fainting, or shortness of breath.      Consultations: Treatment Team:  Dolores Patty, MD  Procedures Performed:  Dg Chest 2 View  06/01/2011  *RADIOLOGY REPORT*  Clinical Data: Hypotension.  CHEST - 2 VIEW  Comparison: 05/01/2011  Findings: Stable substantial cardiomegaly.  There remains interstitial and pulmonary venous prominence without overt airspace edema or pleural fluid.  IMPRESSION: Stable cardiomegaly and changes consistent with pulmonary venous hypertension/interstitial edema.  Original Report Authenticated By: Reola Calkins, M.D.   Dg Wrist Complete Left  05/21/2011  *RADIOLOGY REPORT*  Clinical Data: Pain.  LEFT WRIST - COMPLETE 3+ VIEW  Comparison: None.  Findings: Imaged bones, joints and soft tissues appear normal.  IMPRESSION: Negative study.  Original Report Authenticated By: Bernadene Bell. Maricela Curet, M.D.   Admission HPI:  The patient is a 45 yo man, history of dilated CM (EF 10-20%), HTN, and HL, presenting with a 1-day history of vomiting, and with a creatinine of 4.82. The patient notes a 1-week history of rhinorrhea, and smelling "an ammonia smell", which he attributed to a  sinus infection, and 2 episodes of brown non-bloody emesis this morning, prompting him to seek medical attention at [facility] ED today. On presentation to [facility], he was noted to have a creatinine of 4.82, elevated from 1.14 three weeks prior. He notes poor PO intake recently, eating nothing on the day of admission, and only some cereal on the day prior to admission due to nausea. He also notes increased NSAID use recently for an acute gout flare, taking Motrin 4 tabs/day, starting 11 days PTA, ending 3-4 days PTA. He notes some lightheadedness when standing from a seated position over the last 2-3 days, and some chills though no measured fevers. He notes no cough, sore throat, ear pain, sinus pain, headache, fevers, myalgias, cp, SOB, LE edema, abdominal pain, or diarrhea/constipation. He has a history of dilated CM, with an EF of 10-20%, and reports taking his medications regularly, though he did not take his medications on the day of admission or the day prior to admission.   Hospital Course by problem list:  1. Acute Kidney Injury - patient presents with Cr = 4.82, elevated from 1.14 three weeks earlier. The cause of the patient's AKI is likely prerenal given poor PO intake over the last 2 days, as well as acute interstitial nephritis given increased usage of NSAID's over the last week. The patient notes no difficulty voiding to suggest a post-renal cause of AKI. His FeNA is < 1% which supported a pre-renal cause. He was repleted with normal saline at 125 cc/hr at [facility] hrs, then continued at 75 cc/hr at [facility], to avoid exacerbating CHF. His creatinine came down more than 1 point per day and had nearly normalized upon discharge. Patient should avoid excess NSAID usage in the future.   2. Congestive Heart Failure - NHYA III, EF 10-20%, history of dilated CM, recently discharged 05/10/11 for CHF with volume overload s/p diuresis of nearly 30 pounds. Patient is currently not volume overloaded on exam, though CXR  shows some possible interstitial edema. Lisinopril recently decreased to 10 mg/day due to hypotension on 05/27/11. Weight today is 284 (up from 281 on admission), patient was 308lbs at outpatient appt recently. This would reflect a total of 65 lb weight loss since his admission on 11/16 when he weighed 348 lbs. His lasix and torsemide were held acutely for dehydration but he was restarted on lasix and a reduced dosage of torsemide 20mg  (was on 60mg  on admission). His lisinopril dosage was also reduced to 5mg  (was on 10mg  on admission).  3. Sustained Ventricular Tachycardia- was observed on telemetry on final night of admission 12/20. Patient had 16 second run. Nurse called on-call MD and he was found to be completely asymptomatic with unchanged blood pressure and O2 sat 98%. Cardiology was contacted and they plan to follow up with him to obtain an ICD as an outpatient. They contacted case management who got patient a life-vest until that time. He was instructed to  wear it at all time. He has not had any episode of syncope or pre-syncope.  4. Hypotension - SBP in 80's initially, subsequently improved to 110's, asymptomatic. Patient has a history of HTN though he was hypotensive on admission. We restarted coreg with no hypotension. Blood pressures were 90s-100s/70s.  5. Gout - history of gout, recent wrist flare treated with motrin as an outpatient begin to cause pain on the day after admission. He was startedon a prednisone taper to avoid NSAID use. Will treat with 10 day prednisone taper (60mg  X3 days, 40mg  X3 days, 20mg  X 4 days). Can consider restarting preventative medication as an outpatient though cost of medication possibly colchcine was prohibitive in the past   Discharge Vitals:  BP 97/72  Pulse 77  Temp(Src) 96.7 F (35.9 C) (Oral)  Resp 18  Ht 5\' 10"  (1.778 m)  Wt 284 lb 2.8 oz (128.9 kg)  BMI 40.77 kg/m2  SpO2 94%  Discharge Labs:  Results for orders placed during the hospital encounter  of 06/01/11 (from the past 24 hour(s))  BASIC METABOLIC PANEL     Status: Abnormal   Collection Time   06/04/11  6:44 AM      Component Value Range   Sodium 134 (*) 135 - 145 (mEq/L)   Potassium 3.9  3.5 - 5.1 (mEq/L)   Chloride 103  96 - 112 (mEq/L)   CO2 20  19 - 32 (mEq/L)   Glucose, Bld 86  70 - 99 (mg/dL)   BUN 41 (*) 6 - 23 (mg/dL)   Creatinine, Ser 9.14 (*) 0.50 - 1.35 (mg/dL)   Calcium 78.2  8.4 - 10.5 (mg/dL)   GFR calc non Af Amer 56 (*) >90 (mL/min)   GFR calc Af Amer 65 (*) >90 (mL/min)  MAGNESIUM     Status: Normal   Collection Time   06/04/11  6:44 AM      Component Value Range   Magnesium 1.9  1.5 - 2.5 (mg/dL)    Signed: Margorie John 06/04/2011, 7:17 PM

## 2011-06-07 ENCOUNTER — Ambulatory Visit (HOSPITAL_COMMUNITY)
Admit: 2011-06-07 | Discharge: 2011-06-07 | Disposition: A | Discharge status: Home / Self Care | Payer: Medicaid Other | Source: Ambulatory Visit | Attending: Internal Medicine | Admitting: Internal Medicine

## 2011-06-07 VITALS — BP 96/68 | HR 95 | Wt 283.2 lb

## 2011-06-07 DIAGNOSIS — I472 Ventricular tachycardia, unspecified: Secondary | ICD-10-CM

## 2011-06-07 DIAGNOSIS — I428 Other cardiomyopathies: Secondary | ICD-10-CM | POA: Diagnosis present

## 2011-06-07 DIAGNOSIS — I4729 Other ventricular tachycardia: Secondary | ICD-10-CM | POA: Insufficient documentation

## 2011-06-07 DIAGNOSIS — I420 Dilated cardiomyopathy: Secondary | ICD-10-CM

## 2011-06-07 DIAGNOSIS — I5022 Chronic systolic (congestive) heart failure: Secondary | ICD-10-CM | POA: Diagnosis not present

## 2011-06-07 HISTORY — DX: Ventricular tachycardia: I47.2

## 2011-06-07 HISTORY — DX: Ventricular tachycardia, unspecified: I47.20

## 2011-06-07 LAB — BASIC METABOLIC PANEL
CO2: 27 mEq/L (ref 19–32)
Calcium: 11.7 mg/dL — ABNORMAL HIGH (ref 8.4–10.5)
Creatinine, Ser: 1.69 mg/dL — ABNORMAL HIGH (ref 0.50–1.35)
Glucose, Bld: 92 mg/dL (ref 70–99)

## 2011-06-07 MED ORDER — POTASSIUM CHLORIDE ER 10 MEQ PO TBCR: 20.0000 meq | EXTENDED_RELEASE_TABLET | Freq: Two times a day (BID) | ORAL | Status: DC

## 2011-06-07 NOTE — Progress Notes (Signed)
Encounter addended by: Noralee Space, RN on: 06/07/2011  9:16 AM<BR>     Documentation filed: Patient Instructions Section

## 2011-06-07 NOTE — Progress Notes (Signed)
Patient ID: William Davila, male   DOB: 10-10-1965, 45 y.o.   MRN: [id number]  HPI: William Davila  is a 45 year old AA male with systolic heart failure (EF10-20%) due to non-ischemic cardiolmyopathy diagnosed 2001, hyperlipidemia, HTN, and obesity.  Admitted in November with cardiogenic shock and massive volume overload. Treated with inotropes. Discharged from [facility] 05/10/2011 Discharge weight 328.    11/29 Lisinopril decreased 20 mg daily by PCPdue to hypotension.   Admitted last week with renal failure (Cr up to 4) in the setting of viral illness with poor po intake. Hydrated and Cr improved back to baseline. While in hospital had 75 beat run of VT and LifeVest placed. Here for f/u. Weight on d/c was 284. Now 283.   Feels fine. BP soft but not dizzy. No edema, orthopnea or PND. No LifeVest firings however he was having problems getting a lead off alert. Finally got it fixed. Weighing every day. Weight about 280 at home.   ROS: All systems negative except as listed in HPI, PMH and Problem List.  Past Medical History  Diagnosis Date  . Chronic systolic heart failure     Diagnosed 2001; prior patient of William Davila with William Davila - discharged from practice  . Dilated cardiomyopathy     LVEF 10-20%  . Hyperlipidemia   . Essential hypertension, benign   . Morbid obesity     Current Outpatient Prescriptions  Medication Sig Dispense Refill  . carvedilol (COREG) 3.125 MG tablet Take 1 tablet (3.125 mg total) by mouth 2 (two) times daily with a meal.  60 tablet  6  . digoxin (LANOXIN) 0.25 MG tablet Take 1 tablet (0.25 mg total) by mouth daily.  30 tablet  6  . lisinopril (PRINIVIL,ZESTRIL) 5 MG tablet Take 1 tablet (5 mg total) by mouth daily.  30 tablet  0  . potassium chloride (K-DUR) 10 MEQ tablet Take 2 tablets (20 mEq total) by mouth 2 (two) times daily.  120 tablet  6  . predniSONE (DELTASONE) 20 MG tablet Please take 2 tablets daily for 3 days, then take 1 tablet daily for 4 days then STOP  10  tablet  0  . torsemide (DEMADEX) 20 MG tablet Take 20 mg by mouth daily.           PHYSICAL EXAM: Filed Vitals:   06/07/11 0831  BP: 96/68  Pulse: 95    General:  Well appearing. No resp difficulty HEENT: normal Neck: supple. JVP flat. Carotids 2+ bilaterally; no bruits. No lymphadenopathy or thryomegaly appreciated. Cor: PMI normal. Regular rate & rhythm. No rubs, S3  or murmurs. Lungs: clear Abdomen: soft, nontender, nondistended. No hepatosplenomegaly. No bruits or masses. Good bowel sounds. Extremities: no cyanosis, clubbing, rash, edema Neuro: alert & orientedx3, cranial nerves grossly intact. Moves all 4 extremities w/o difficulty. Affect pleasant.    ASSESSMENT & PLAN:

## 2011-06-07 NOTE — Assessment & Plan Note (Signed)
Continue with LifeVest. Place ICD after 3 months of compliance if EF remains down.

## 2011-06-07 NOTE — Assessment & Plan Note (Signed)
Doing much better. NYHA II. Volume status looks great. Reinforced need for sliding scale diuretics. Goal weight 280-285. If weight below 280 no demadex. If above 285 take 40 mg. BP soft so will not titrate meds further at this time. Check labs today. See back in 2 weeks.

## 2011-06-07 NOTE — Patient Instructions (Signed)
Labs today  Your physician recommends that you schedule a follow-up appointment in: 2 weeks  Do the following things EVERYDAY: 1) Weigh yourself in the morning before breakfast. Write it down and keep it in a log. 2) Take your medicines as prescribed 3) Eat low salt foods-Limit salt (sodium) to 2000mg  per day.  4) Stay as active as you can everyday

## 2011-06-16 ENCOUNTER — Other Ambulatory Visit: Payer: Self-pay | Admitting: *Deleted

## 2011-06-16 ENCOUNTER — Other Ambulatory Visit: Payer: Self-pay | Admitting: Family Medicine

## 2011-06-16 DIAGNOSIS — I420 Dilated cardiomyopathy: Secondary | ICD-10-CM

## 2011-06-16 MED ORDER — HYDROCODONE-ACETAMINOPHEN 5-500 MG PO TABS: 2.0000 | ORAL_TABLET | Freq: Four times a day (QID) | ORAL | Status: DC | PRN

## 2011-06-16 NOTE — Telephone Encounter (Signed)
RX CALLED IN

## 2011-06-17 ENCOUNTER — Ambulatory Visit (INDEPENDENT_AMBULATORY_CARE_PROVIDER_SITE_OTHER): Payer: Self-pay | Admitting: Internal Medicine

## 2011-06-17 ENCOUNTER — Encounter: Payer: Self-pay | Admitting: Internal Medicine

## 2011-06-17 VITALS — BP 89/60 | HR 88 | Temp 97.1°F | Ht 70.0 in | Wt 285.3 lb

## 2011-06-17 DIAGNOSIS — IMO0002 Reserved for concepts with insufficient information to code with codable children: Secondary | ICD-10-CM

## 2011-06-17 DIAGNOSIS — M25561 Pain in right knee: Secondary | ICD-10-CM | POA: Insufficient documentation

## 2011-06-17 DIAGNOSIS — M25569 Pain in unspecified knee: Secondary | ICD-10-CM

## 2011-06-17 DIAGNOSIS — R0981 Nasal congestion: Secondary | ICD-10-CM | POA: Insufficient documentation

## 2011-06-17 DIAGNOSIS — S76119A Strain of unspecified quadriceps muscle, fascia and tendon, initial encounter: Secondary | ICD-10-CM

## 2011-06-17 DIAGNOSIS — J3489 Other specified disorders of nose and nasal sinuses: Secondary | ICD-10-CM

## 2011-06-17 DIAGNOSIS — I428 Other cardiomyopathies: Secondary | ICD-10-CM

## 2011-06-17 MED ORDER — GUAIFENESIN ER 600 MG PO TB12: 600.0000 mg | ORAL_TABLET | Freq: Two times a day (BID) | ORAL | Status: DC

## 2011-06-17 MED ORDER — TRAMADOL HCL 50 MG PO TABS: 50.0000 mg | ORAL_TABLET | Freq: Four times a day (QID) | ORAL | Status: DC | PRN

## 2011-06-17 NOTE — Assessment & Plan Note (Addendum)
Patient complains of right knee pain with ambulation. Exam is benign, I suspect some mild arthritis given his favoring it with his left knee problem. I've referred him to physical therapy for bilateral knee strengthening, and prescribed tramadol for pain relief. I don't think he needs an x-ray at this time, but if his pain persists we will get one at his next visit.

## 2011-06-17 NOTE — Assessment & Plan Note (Addendum)
Overall the patient is significantly improved from I last saw him during his hospitalization in November. She has lost a substantial amount of water weight. He is ambulating better now that he weighs less. Patient continues to be followed by cardiology and Dr. Gala Romney.  There does appear to be some discrepancy between his weight measured at home this morning which was 271, and weight here in the office which is 285. We discussed the importance of wearing or so consistently on the same scale, and either way is correct he will not take it Demadex today. He's been compliant with weighing himself daily. He's been compliant with his life-vest therapy. He will followup on 1/7 with cardiology. Long-term plan is for ICD placement 3 months of compliance continues. Patient's blood pressures continued to be soft, but we will continue his current regimen for now as he denies any orthostatic symptoms or fainting episodes.

## 2011-06-17 NOTE — Progress Notes (Signed)
Subjective:     Patient ID: William Davila, male   DOB: 02/27/1966, 46 y.o.   MRN: [id number]  HPI Is a pleasant 46 year old man with history of cardiomyopathy and recent hospitalizations at the end of 2012 for both cardiomyopathy and acute kidney injury.  #Cardiomyopathy: Patient last seen by Dr. Gala Romney on 12/24, doing well. Continues to have life-vest, no firings as of last check. Patient denies chest pain, palpitations, shortness of breath. Weighing himself daily at home, last set he was 271. Instructed to be on sliding-scale Demadex, with no med if under 280, and 40 mg if over 285. Will followup with cardiology on 06/21/11. Long-term plan is for ICD placement at 3 months of good life-.vest compliance. Seems to be compliant with regimen of lisinopril, digoxin, Coreg, torsemide.  #Bilateral knee pain: Patient has history of left quadriceps tendon partial tear, and now has pain on the right because he is compensating. He is ambulating with both crutches and a walker, which is much improved from being wheelchair bound late in 2012. Patient has been refer to sports medicine for the partial tear, and had gone to see physical therapy one time but had exacerbation of the right knee, and then started having palpitations of his cardiomyopathy.  #Nasal congestion: Patient complains of intermittent left nasal congestion (especially with blowing his nose), and some blood on blowing his nose one time. He continues to complain of a "ammonia smell" that was documented during his prior hospitalization. He denies fevers or chills, constant rhinorrhea, cough, sinus pressure, headache, tooth pain.  Review of Systems As per history of present illness    Objective:   Physical Exam Filed Vitals:   06/17/11 1335  BP: 89/60  Pulse: 88  Temp: 97.1 F (36.2 C)  TempSrc: Oral  Height: 5\' 10"  (1.778 m)  Weight: 285 lb 4.8 oz (129.411 kg)   GEN: No apparent distress.  Alert and oriented x 3.  Pleasant, conversant,  and cooperative to exam. HEENT: NCAT.  Neck is supple without palpable masses or lymphadenopathy.  EOMI.  PERRLA.  Sclerae anicteric.  Conjunctivae noninjected. MMM.  Oropharynx is without erythema, exudates, or other abnormal lesions. No TTP over frontal and maxillary sinuses b/l.  TM's clear b/l. RESP:  Lungs are clear to ascultation bilaterally with good air movement.  No wheezes, ronchi, or rubs. CARDIOVASCULAR: regular rate, normal rhythm.  Clear S1, S2, no murmurs, gallops, or rubs. ABDOMEN: soft, non-tender, non-distended.  Bowels sounds present in all quadrants and normoactive.  No palpable masses. EXT: warm and dry.  Peripheral pulses equal, intact, and +2 globally.  No clubbing or cyanosis.  No edema in b/l lower extremeties SKIN: warm and dry with normal turgor.  No rashes or abnormal lesions observed. R knee: no edema, good ROM, no TTP.  Neg lachman, neg mcmurray. L knee: no edema, good ROM, no TTP.  Some pain with active ROM.  Pt c/o feeling of hyperextension with ambulation.  Neg lachman, neg mcmurray. NEURO: CN II-XII grossly intact.  Muscle strength +5/5 in bilateral upper and lower extremities.  Sensation is grossly intact.  No focal deficit.     Assessment:         Plan:

## 2011-06-17 NOTE — Assessment & Plan Note (Addendum)
Patient had been seen by sports medicine, and attended one session of PT before having R sided (contralateral) knee pain.  He then was hospitalized for severe CHF and volume overload. It was difficult for him to ambulate with excess water weight, and he became very deconditioned and was wheelchair-bound for some time. Home health PT has not been approved yet. I have referred him for outpatient PT, which he thinks he can attend, as he is now ambulating better, using crutches and a walker. He does not spend any time in the wheelchair anymore, which is excellent. I've asked PT to help with bilateral knee strengthening, as I suspect his quadriceps tendon partial tear has since healed. He complains of feelings of hyperextension with ambulation in the left leg, which I think requires strengthening and better range of motion. He also complains of right knee pain, to suspect his mild arthritis.

## 2011-06-17 NOTE — Assessment & Plan Note (Signed)
As per his last admission, patient continues to complain of a "ammonia smell", and described one episode of blood when blowing his nose on the left near. Patient also complains of intermittent rhinorrhea, particularly with eating. Patient denies any tooth pain, sinus pain, and he does not appear to be suffering from acute viral or bacterial illness at this time. His physical exam was unremarkable. He said that Mucinex prescribed in the hospital helped him, so I gave him an additional prescription today. He did ask for some antibiotics, but I don't think that is indicated at this time given his lack of signs and symptoms for rhinosinusitis. If he continues to have any other blood, increase in nasal congestion, fevers or chills, or other concerning signs, we could consider giving him antibiotics. He may also benefit from a nasal steroid, but again, he does not have consistent nasal congestion or signs of postnasal drip.

## 2011-06-18 NOTE — Progress Notes (Signed)
I discussed patient with resident Dr. [name], and I agree with the plans as outlined in his note. 

## 2011-06-21 ENCOUNTER — Ambulatory Visit (HOSPITAL_COMMUNITY)
Admission: RE | Admit: 2011-06-21 | Discharge: 2011-06-21 | Disposition: A | Discharge status: Home / Self Care | Payer: Medicaid Other | Source: Ambulatory Visit | Attending: Internal Medicine | Admitting: Internal Medicine

## 2011-06-21 ENCOUNTER — Encounter (HOSPITAL_COMMUNITY): Payer: Self-pay

## 2011-06-21 ENCOUNTER — Other Ambulatory Visit: Payer: Self-pay | Admitting: Internal Medicine

## 2011-06-21 VITALS — BP 90/58 | HR 95 | Wt 284.1 lb

## 2011-06-21 DIAGNOSIS — M25569 Pain in unspecified knee: Secondary | ICD-10-CM | POA: Diagnosis not present

## 2011-06-21 DIAGNOSIS — I5022 Chronic systolic (congestive) heart failure: Secondary | ICD-10-CM | POA: Diagnosis not present

## 2011-06-21 DIAGNOSIS — M25562 Pain in left knee: Secondary | ICD-10-CM

## 2011-06-21 LAB — BASIC METABOLIC PANEL
BUN: 18 mg/dL (ref 6–23)
CO2: 23 mEq/L (ref 19–32)
Calcium: 10.3 mg/dL (ref 8.4–10.5)
Creatinine, Ser: 1.01 mg/dL (ref 0.50–1.35)
Glucose, Bld: 99 mg/dL (ref 70–99)

## 2011-06-21 MED ORDER — CARVEDILOL 3.125 MG PO TABS: ORAL_TABLET | ORAL | Status: DC

## 2011-06-21 NOTE — Patient Instructions (Addendum)
Do the following things EVERYDAY: 1) Weigh yourself in the morning before breakfast. Write it down and keep it in a log. 2) Take your medicines as prescribed 3) Eat low salt foods-Limit salt (sodium) to 2000mg  per day.  4) Stay as active as you can everyday  Take Carvedilol 3.125 mg in am and 6.25 mg in the pm  Follow up in 3 weeks.

## 2011-06-21 NOTE — Assessment & Plan Note (Signed)
Continue with LifeVest. Place ICD after 3 months of compliance if EF remains down.  

## 2011-06-21 NOTE — Assessment & Plan Note (Addendum)
Complains of persistent L knee pain. Using a walker. Refer to Orthopedics.

## 2011-06-21 NOTE — Assessment & Plan Note (Addendum)
NYHA II. Volume status stable. Will  titrate Carvedilol at night only. Increase night time Carvedilol 6.25 mg .  Provided 30 day supply Digoxin and Carvedilol. Will re-check bmet today. Re-check ECHO in 10 weeks. Follow up 3 weeks.   Patient seen and examined with Tonye Becket, NP. We discussed all aspects of the encounter. I agree with the assessment and plan as stated above. Suspect he is more NYHA III but hard to tell as he is mostly limited by his knee. Volume status ok. BP soft. Will attempt to titrate night carvedilol. Long discussion about need to follow weights closely and use torsemide as needed. Refer to ortho for formal evaluation of L knee. Continue LifeVest. Will likely need ICD in 2-3 months.

## 2011-06-21 NOTE — Progress Notes (Signed)
Patient ID: William Davila, male   DOB: [birth year], 46 y.o.   MRN: [id number] Patient ID: William Davila, male   DOB: 1965-07-20, 46 y.o.   MRN: [id number]  HPI: William Davila  is a 46 year old AA male with systolic heart failure (EF10-20%) due to non-ischemic cardiolmyopathy diagnosed 2001, hyperlipidemia, HTN, and obesity.  Admitted in November with cardiogenic shock and massive volume overload. Treated with inotropes. Discharged from [facility] 05/10/2011 Discharge weight 328.    11/29 Lisinopril decreased 20 mg daily by PCPdue to hypotension.   Admitted last week with renal failure (Cr up to 4) in the setting of viral illness with poor po intake and volume depletion. Hydrated and Cr improved back to baseline. While in hospital had 75 beat run of VT and LifeVest placed. Here for f/u. Weight on d/c was 284.  Denies SOB/Orthopnea/ PND/edema but ambulation markedly limited due to L knee pain.  No syncope/presyncope/no LifeVest firings.  Weighing every day. Weight about 271-279 at home. He has not been taking Torsemide since 12/24 because he was instructed to only take if his weight was above 280 pounds. Complaint with medications but just ran out of digoxin and carvedilol. Following low salt diet. Disability pending.   ROS: All systems negative except as listed in HPI, PMH and Problem List.  Past Medical History  Diagnosis Date  . Chronic systolic heart failure     Diagnosed 2001; prior patient of Dr. Mendel Ryder with [facility] - discharged from practice  . Dilated cardiomyopathy     LVEF 10-20%  . Hyperlipidemia   . Essential hypertension, benign   . Morbid obesity     Current Outpatient Prescriptions  Medication Sig Dispense Refill  . carvedilol (COREG) 3.125 MG tablet Take 1 tablet (3.125 mg total) by mouth 2 (two) times daily with a meal.  60 tablet  6  . digoxin (LANOXIN) 0.25 MG tablet TAKE 1 TABLET BY MOUTH ONCE DAILY  34 tablet  0  . guaiFENesin (MUCINEX) 600 MG 12 hr tablet Take 1 tablet (600 mg  total) by mouth 2 (two) times daily.  60 tablet  2  . HYDROcodone-acetaminophen (VICODIN) 5-500 MG per tablet Take 2 tablets by mouth every 6 (six) hours as needed for pain.  20 tablet  0  . lisinopril (PRINIVIL,ZESTRIL) 5 MG tablet Take 1 tablet (5 mg total) by mouth daily.  30 tablet  0  . torsemide (DEMADEX) 20 MG tablet Take 20 mg by mouth daily.        . traMADol (ULTRAM) 50 MG tablet Take 1 tablet (50 mg total) by mouth every 6 (six) hours as needed for pain.  20 tablet  1  . potassium chloride (K-DUR) 10 MEQ tablet Take 2 tablets (20 mEq total) by mouth 2 (two) times daily.  120 tablet  6  . predniSONE (DELTASONE) 20 MG tablet Please take 2 tablets daily for 3 days, then take 1 tablet daily for 4 days then STOP  10 tablet  0     PHYSICAL EXAM: Filed Vitals:   06/21/11 1348  BP: 90/58  Pulse: 95   Weight 284 Mom present General:  Well appearing. No resp difficulty HEENT: normal Neck: supple. JVP flat. Carotids 2+ bilaterally; no bruits. No lymphadenopathy or thryomegaly appreciated. Cor: PMI nonpalpable. Regular rate & rhythm. No rubs, S3  or murmurs. Life Vest in place Lungs: clear Abdomen: soft,  Obese. nontender, nondistended. No hepatosplenomegaly. No bruits or masses. Good bowel sounds. Extremities: no cyanosis, clubbing,  rash, edema. No effusion over knee.  Neuro: alert & orientedx3, cranial nerves grossly intact. Moves all 4 extremities w/o difficulty. Affect pleasant.    ASSESSMENT & PLAN:

## 2011-06-30 ENCOUNTER — Emergency Department (HOSPITAL_COMMUNITY)
Admission: EM | Admit: 2011-06-30 | Discharge: 2011-06-30 | Disposition: A | Discharge status: Home / Self Care | Payer: Medicaid Other | Attending: Emergency Medicine | Admitting: Emergency Medicine

## 2011-06-30 ENCOUNTER — Emergency Department (HOSPITAL_COMMUNITY): Payer: Medicaid Other

## 2011-06-30 ENCOUNTER — Encounter (HOSPITAL_COMMUNITY): Payer: Self-pay | Admitting: *Deleted

## 2011-06-30 DIAGNOSIS — E785 Hyperlipidemia, unspecified: Secondary | ICD-10-CM | POA: Insufficient documentation

## 2011-06-30 DIAGNOSIS — R609 Edema, unspecified: Secondary | ICD-10-CM | POA: Insufficient documentation

## 2011-06-30 DIAGNOSIS — M7989 Other specified soft tissue disorders: Secondary | ICD-10-CM | POA: Diagnosis not present

## 2011-06-30 DIAGNOSIS — M25579 Pain in unspecified ankle and joints of unspecified foot: Secondary | ICD-10-CM | POA: Diagnosis not present

## 2011-06-30 DIAGNOSIS — M79609 Pain in unspecified limb: Secondary | ICD-10-CM | POA: Diagnosis present

## 2011-06-30 DIAGNOSIS — I509 Heart failure, unspecified: Secondary | ICD-10-CM | POA: Insufficient documentation

## 2011-06-30 DIAGNOSIS — I10 Essential (primary) hypertension: Secondary | ICD-10-CM | POA: Insufficient documentation

## 2011-06-30 DIAGNOSIS — Z79899 Other long term (current) drug therapy: Secondary | ICD-10-CM | POA: Insufficient documentation

## 2011-06-30 MED ORDER — OXYCODONE-ACETAMINOPHEN 5-325 MG PO TABS: 2.0000 | ORAL_TABLET | ORAL | Status: AC | PRN

## 2011-06-30 NOTE — ED Notes (Signed)
Pt with external defibrillator Campos-Garcia, Tresa Endo

## 2011-06-30 NOTE — ED Notes (Signed)
ZOX:[id number]<VW> Expected date:06/30/11<BR> Expected time: 1:18 PM<BR> Means of arrival:Ambulance<BR> Comments:<BR> EMS 30 GC - foot pain/hx of gout

## 2011-06-30 NOTE — ED Notes (Signed)
Pt states "I've had gout before"

## 2011-06-30 NOTE — ED Provider Notes (Signed)
History     CSN: [id number]  Arrival date & time 06/30/11  1326   First MD Initiated Contact with Patient 06/30/11 1513      Chief Complaint  Patient presents with  . Foot Pain    right    (Consider location/radiation/quality/duration/timing/severity/associated sxs/prior treatment) HPI William Davila is a 46 y.o. male presents with c/o right ankle pain leading to desire to be assessed in the ED. The sx(s) have been present for 3 days. Additional concerns are right knee pain and swelling. Causative factors are he has been using his right leg more d/t his left knee giving out. Palliative factors are Vicodin. The distress associated is mild. The disorder has been present for 3 days. He previously had gout in the tight toes; this feels different. Past Medical History  Diagnosis Date  . Chronic systolic heart failure     Diagnosed 2001; prior patient of Dr. Mendel Ryder with [facility] - discharged from practice  . Dilated cardiomyopathy     LVEF 10-20%  . Hyperlipidemia   . Essential hypertension, benign   . Morbid obesity   . Gout   . CHF (congestive heart failure)   . Cardiac arrhythmia     Past Surgical History  Procedure Date  . No past surgeries     Family History  Problem Relation Age of Onset  . Hypertension Mother     History  Substance Use Topics  . Smoking status: Never Smoker   . Smokeless tobacco: Never Used  . Alcohol Use: No      Review of Systems  All other systems reviewed and are negative.    Allergies  Beta adrenergic blockers and Bidil  Home Medications   Current Outpatient Rx  Name Route Sig Dispense Refill  . ACETAMINOPHEN 500 MG PO TABS Oral Take 1,000 mg by mouth every 6 (six) hours as needed. For pain    . CARVEDILOL 3.125 MG PO TABS Oral Take 1.56-3.125 mg by mouth 2 (two) times daily with a meal. Take 0.5 tablet in morning and 1 tablet at night    . DIGOXIN 0.25 MG PO TABS  TAKE 1 TABLET BY MOUTH ONCE DAILY 34 tablet 0  . GUAIFENESIN ER  600 MG PO TB12 Oral Take 1,200 mg by mouth 2 (two) times daily.    Marland Kitchen HYDROCODONE-ACETAMINOPHEN 5-500 MG PO TABS Oral Take 2 tablets by mouth every 6 (six) hours as needed for pain. 20 tablet 0  . LISINOPRIL 5 MG PO TABS Oral Take 1 tablet (5 mg total) by mouth daily. 30 tablet 0  . TORSEMIDE 20 MG PO TABS Oral Take 20 mg by mouth daily.      . TRAMADOL HCL 50 MG PO TABS Oral Take 100 mg by mouth every 6 (six) hours as needed. For pain.    . OXYCODONE-ACETAMINOPHEN 5-325 MG PO TABS Oral Take 2 tablets by mouth every 4 (four) hours as needed for pain. 15 tablet 0    BP 97/49  Pulse 65  Temp(Src) 98.5 F (36.9 C) (Oral)  Resp 18  SpO2 98%  Physical Exam  Constitutional: He appears well-developed and well-nourished.  HENT:  Head: Normocephalic.  Pulmonary/Chest: Effort normal.  Musculoskeletal: He exhibits edema (2+ both legs.).  Neurological: He is alert.       Right knee mild swelling/effusion, NTTP. Right ankle tender and swollen laterally. No right foot tenderness.    ED Course  Procedures (including critical care time)  Labs Reviewed - No data to  display Dg Ankle Complete Right  06/30/2011  *RADIOLOGY REPORT*  Clinical Data: Swelling and pain  RIGHT ANKLE - COMPLETE 3+ VIEW  Comparison: None.  Findings: Chronic appearing bony density is seen inferior to the tip of the fibula.  Marked soft tissue swelling about the ankle joint is noted.  Minimal spurring at the inferior calcaneus.  No definite acute fracture or dislocation.  Ankle mortise is anatomically aligned. No erosive changes or destructive bone lesion.    Minimal periosteal reaction along the medial distal tibia is nonspecific.  IMPRESSION: No acute bony pathology.  Chronic changes.  Diffuse soft tissue swelling about the ankle.  Original Report Authenticated By: Donavan Burnet, M.D.     1. Ankle pain       MDM  Eval c/w isolated right ankle pain d/t swelling. DDX includes gout, occult injury,  arthritis.         Flint Melter, MD 06/30/11 1705

## 2011-07-02 ENCOUNTER — Emergency Department (HOSPITAL_COMMUNITY): Payer: Medicaid Other

## 2011-07-02 ENCOUNTER — Encounter (HOSPITAL_COMMUNITY): Payer: Self-pay

## 2011-07-02 ENCOUNTER — Emergency Department (HOSPITAL_COMMUNITY)
Admission: EM | Admit: 2011-07-02 | Discharge: 2011-07-02 | Disposition: A | Discharge status: Home / Self Care | Payer: Medicaid Other | Attending: Emergency Medicine | Admitting: Emergency Medicine

## 2011-07-02 ENCOUNTER — Encounter (HOSPITAL_COMMUNITY): Payer: Self-pay | Admitting: Neurology

## 2011-07-02 DIAGNOSIS — M79609 Pain in unspecified limb: Secondary | ICD-10-CM | POA: Diagnosis not present

## 2011-07-02 DIAGNOSIS — M25569 Pain in unspecified knee: Secondary | ICD-10-CM | POA: Diagnosis present

## 2011-07-02 DIAGNOSIS — I5022 Chronic systolic (congestive) heart failure: Secondary | ICD-10-CM | POA: Diagnosis not present

## 2011-07-02 DIAGNOSIS — I10 Essential (primary) hypertension: Secondary | ICD-10-CM | POA: Diagnosis not present

## 2011-07-02 DIAGNOSIS — M25439 Effusion, unspecified wrist: Secondary | ICD-10-CM | POA: Diagnosis not present

## 2011-07-02 DIAGNOSIS — M109 Gout, unspecified: Secondary | ICD-10-CM

## 2011-07-02 DIAGNOSIS — M7989 Other specified soft tissue disorders: Secondary | ICD-10-CM | POA: Diagnosis not present

## 2011-07-02 DIAGNOSIS — I509 Heart failure, unspecified: Secondary | ICD-10-CM | POA: Insufficient documentation

## 2011-07-02 DIAGNOSIS — E785 Hyperlipidemia, unspecified: Secondary | ICD-10-CM | POA: Diagnosis not present

## 2011-07-02 DIAGNOSIS — M25579 Pain in unspecified ankle and joints of unspecified foot: Secondary | ICD-10-CM | POA: Insufficient documentation

## 2011-07-02 DIAGNOSIS — Z79899 Other long term (current) drug therapy: Secondary | ICD-10-CM | POA: Insufficient documentation

## 2011-07-02 DIAGNOSIS — M25476 Effusion, unspecified foot: Secondary | ICD-10-CM | POA: Insufficient documentation

## 2011-07-02 DIAGNOSIS — M25561 Pain in right knee: Secondary | ICD-10-CM

## 2011-07-02 DIAGNOSIS — M25473 Effusion, unspecified ankle: Secondary | ICD-10-CM | POA: Diagnosis not present

## 2011-07-02 MED ORDER — OXYCODONE-ACETAMINOPHEN 5-325 MG PO TABS: 1.0000 | ORAL_TABLET | Freq: Four times a day (QID) | ORAL | Status: AC | PRN

## 2011-07-02 MED ORDER — PREDNISONE 50 MG PO TABS: 50.0000 mg | ORAL_TABLET | Freq: Every day | ORAL | Status: DC

## 2011-07-02 MED ORDER — DEXAMETHASONE SODIUM PHOSPHATE 10 MG/ML IJ SOLN
10.0000 mg | Freq: Once | INTRAMUSCULAR | Status: AC
  Administered 2011-07-02: 10 mg via INTRAMUSCULAR
  Filled 2011-07-02: qty 1

## 2011-07-02 NOTE — ED Notes (Signed)
Pt denying any SOB. Pt is 100% on RA. Lung sounds clear. +1 pitting edema noted in ankles. Pt has right ankle brace on. At this time c/o right knee and right hand pain. Right knee is swollen when compared to left. Pt has active ROM but is painful.

## 2011-07-02 NOTE — ED Notes (Signed)
Cardiology at bedside to see pt.

## 2011-07-02 NOTE — ED Notes (Addendum)
PER EMS- Pt has hx of CHF, Cardiologist instructing pt to come to ED for complaints of right knee swelling, right hand swelling. Pt alert and oriented. Vitals 120/78, HR 88, 18. NAD. Pt has life vest in place

## 2011-07-02 NOTE — ED Provider Notes (Signed)
History     CSN: [id number]  Arrival date & time 07/02/11  1406   First MD Initiated Contact with Patient 07/02/11 1416      Chief Complaint  Patient presents with  . Knee Pain    generalized right sided swelling    (Consider location/radiation/quality/duration/timing/severity/associated sxs/prior treatment)  The history is provided by the patient.   Patient with history of chronic CHF presents with R knee pain and swelling. This is a chronic issue that has been going on for weeks. He says he is unable to place pressure on his R leg and therefore, cannot walk. He has been using a walker now for several weeks. The patient believes that his R knee pain is because he is compensating for a prior left quadriceps tendon partial tear. He is also complaining of R hand swelling and pain. [facility]. He went to [facility] ED 2 days ago, was seen for R ankle swelling and pain, and given Oxycontin. He requests information on home healthcare.   Past Medical History  Diagnosis Date  . Chronic systolic heart failure     Diagnosed 2001; prior patient of Dr. Mendel Ryder with [facility] - discharged from practice  . Dilated cardiomyopathy     LVEF 10-20%  . Hyperlipidemia   . Essential hypertension, benign   . Morbid obesity   . Gout   . CHF (congestive heart failure)   . Cardiac arrhythmia     Past Surgical History  Procedure Date  . No past surgeries     Family History  Problem Relation Age of Onset  . Hypertension Mother     History  Substance Use Topics  . Smoking status: Never Smoker   . Smokeless tobacco: Never Used  . Alcohol Use: No      Review of Systems All pertinent positives and negatives reviewed in the history of present illness  Allergies  Beta adrenergic blockers and Bidil  Home Medications   Current Outpatient Rx  Name Route Sig Dispense Refill  . ACETAMINOPHEN 500 MG PO TABS Oral Take 1,000 mg by mouth every 6 (six) hours as  needed. For pain    . CARVEDILOL 3.125 MG PO TABS Oral Take 1.56-3.125 mg by mouth 2 (two) times daily with a meal. Take 0.5 tablet in morning and 1 tablet at night    . DIGOXIN 0.25 MG PO TABS  TAKE 1 TABLET BY MOUTH ONCE DAILY 34 tablet 0  . GUAIFENESIN ER 600 MG PO TB12 Oral Take 1,200 mg by mouth 2 (two) times daily.    Marland Kitchen HYDROCODONE-ACETAMINOPHEN 5-500 MG PO TABS Oral Take 2 tablets by mouth every 6 (six) hours as needed for pain. 20 tablet 0  . LISINOPRIL 5 MG PO TABS Oral Take 1 tablet (5 mg total) by mouth daily. 30 tablet 0  . OXYCODONE-ACETAMINOPHEN 5-325 MG PO TABS Oral Take 2 tablets by mouth every 4 (four) hours as needed for pain. 15 tablet 0  . TORSEMIDE 20 MG PO TABS Oral Take 20 mg by mouth daily.      . TRAMADOL HCL 50 MG PO TABS Oral Take 100 mg by mouth every 6 (six) hours as needed. For pain.      BP 124/68  Pulse 82  Temp(Src) 98.7 F (37.1 C) (Oral)  Resp 18  SpO2 99%  Physical Exam  Constitutional: He appears well-developed and well-nourished. No distress.  HENT:  Head: Normocephalic and atraumatic.  Cardiovascular: Normal  rate, regular rhythm and normal heart sounds.        Mild ankle edema present bilaterally  Pulmonary/Chest: Effort normal and breath sounds normal. No respiratory distress.  Abdominal: Soft. Bowel sounds are normal.  Musculoskeletal:       Right wrist: He exhibits tenderness and swelling. He exhibits normal range of motion.       Right knee: He exhibits normal range of motion. tenderness found.       Right ankle: He exhibits decreased range of motion. tenderness.       Patient has a brace on his R ankle.  Skin: He is not diaphoretic.    ED Course  Procedures (including critical care time)  Labs Reviewed - No data to display Dg Ankle Complete Right  06/30/2011  *RADIOLOGY REPORT*  Clinical Data: Swelling and pain  RIGHT ANKLE - COMPLETE 3+ VIEW  Comparison: None.  Findings: Chronic appearing bony density is seen inferior to the tip of  the fibula.  Marked soft tissue swelling about the ankle joint is noted.  Minimal spurring at the inferior calcaneus.  No definite acute fracture or dislocation.  Ankle mortise is anatomically aligned. No erosive changes or destructive bone lesion.    Minimal periosteal reaction along the medial distal tibia is nonspecific.  IMPRESSION: No acute bony pathology.  Chronic changes.  Diffuse soft tissue swelling about the ankle.  Original Report Authenticated By: William Davila, M.D.     No diagnosis found.    MDM  MDM Reviewed: nursing note, previous chart and vitals Reviewed previous: x-ray Interpretation: x-ray Consults: primary care provider   I spoke with the outpatient clinics about the patient and they will see the patient in followup.  His cardiologist came down and also looked at him because he was in the ER.  He agreed that he felt like this is a gout-related issue.William Davila William Vasco, PA-C 07/02/11 1623

## 2011-07-03 NOTE — ED Provider Notes (Signed)
Medical screening examination/treatment/procedure(s) were performed by non-physician practitioner and as supervising physician I was immediately available for consultation/collaboration.   Laray Anger, DO 07/03/11 1040

## 2011-07-12 ENCOUNTER — Encounter (HOSPITAL_COMMUNITY): Payer: Self-pay

## 2011-07-12 ENCOUNTER — Ambulatory Visit (HOSPITAL_COMMUNITY)
Admission: RE | Admit: 2011-07-12 | Discharge: 2011-07-12 | Disposition: A | Discharge status: Home / Self Care | Payer: Medicaid Other | Source: Ambulatory Visit | Attending: Internal Medicine | Admitting: Internal Medicine

## 2011-07-12 VITALS — BP 94/50 | HR 93 | Wt 283.8 lb

## 2011-07-12 DIAGNOSIS — E669 Obesity, unspecified: Secondary | ICD-10-CM | POA: Insufficient documentation

## 2011-07-12 DIAGNOSIS — I5020 Unspecified systolic (congestive) heart failure: Secondary | ICD-10-CM | POA: Diagnosis present

## 2011-07-12 DIAGNOSIS — I428 Other cardiomyopathies: Secondary | ICD-10-CM | POA: Diagnosis not present

## 2011-07-12 DIAGNOSIS — I499 Cardiac arrhythmia, unspecified: Secondary | ICD-10-CM | POA: Diagnosis not present

## 2011-07-12 DIAGNOSIS — M25569 Pain in unspecified knee: Secondary | ICD-10-CM | POA: Insufficient documentation

## 2011-07-12 DIAGNOSIS — E785 Hyperlipidemia, unspecified: Secondary | ICD-10-CM | POA: Insufficient documentation

## 2011-07-12 DIAGNOSIS — Z79899 Other long term (current) drug therapy: Secondary | ICD-10-CM | POA: Insufficient documentation

## 2011-07-12 DIAGNOSIS — I5022 Chronic systolic (congestive) heart failure: Secondary | ICD-10-CM | POA: Diagnosis not present

## 2011-07-12 DIAGNOSIS — I10 Essential (primary) hypertension: Secondary | ICD-10-CM | POA: Diagnosis not present

## 2011-07-12 DIAGNOSIS — I2589 Other forms of chronic ischemic heart disease: Secondary | ICD-10-CM | POA: Diagnosis not present

## 2011-07-12 NOTE — Progress Notes (Signed)
HPI:  [name]  is a 46 year old AA male with systolic heart failure (EF10-20%) due to non-ischemic cardiolmyopathy diagnosed 2001, hyperlipidemia, HTN, and obesity.  11/29 Lisinopril decreased 20 mg daily by PCP due to hypotension.   12/18 admitted with renal failure (Cr up to 4) in the setting of viral illness with poor po intake and volume depletion. Hydrated and Cr improved back to baseline. While in hospital had 75 beat run of VT and LifeVest placed. Weight on d/c was 284.  He returns for follow up today.  He has been evaluated in the ER twice in the last month for L knee pain.  He has finished a round of steroids and feels much better.  He has an appt with ortho tomorrow for further evaluation.  Currently denies SOB/Orthopnea/ PND/edema.  Ambulation limited due to L knee pain, using walker.  He denies syncope/presyncope.  No LifeVest alarms or firings.  He is weighing every day, weight ranges 272-278.  He has taken 1 extra fluid pill over the last 3 weeks.  He has been complaint with his medications. Following low salt diet. Disability pending.    ROS: All systems negative except as listed in HPI, PMH and Problem List.  Past Medical History  Diagnosis Date  . Chronic systolic heart failure     Diagnosed 2001; prior patient of Dr. Mendel Ryder with [facility] - discharged from practice  . Dilated cardiomyopathy     LVEF 10-20%  . Hyperlipidemia   . Essential hypertension, benign   . Morbid obesity   . Gout   . CHF (congestive heart failure)   . Cardiac arrhythmia     Current Outpatient Prescriptions  Medication Sig Dispense Refill  . carvedilol (COREG) 3.125 MG tablet Take 1.56-3.125 mg by mouth 2 (two) times daily with a meal. Take 0.5 tablet in morning and 1 tablet at night      . digoxin (LANOXIN) 0.25 MG tablet TAKE 1 TABLET BY MOUTH ONCE DAILY  34 tablet  0  . guaiFENesin (MUCINEX) 600 MG 12 hr tablet Take 600 mg by mouth daily as needed.       Marland Kitchen lisinopril (PRINIVIL,ZESTRIL) 5 MG  tablet Take 1 tablet (5 mg total) by mouth daily.  30 tablet  0  . torsemide (DEMADEX) 20 MG tablet Take 20 mg by mouth daily as needed. When weight goes up      . oxyCODONE-acetaminophen (PERCOCET) 5-325 MG per tablet Take 1 tablet by mouth every 6 (six) hours as needed for pain.  15 tablet  0     PHYSICAL EXAM: Filed Vitals:   07/12/11 1433  BP: 94/50  Pulse: 93  Weight: 283 lb 12 oz (128.708 kg)  SpO2: 97%   General:  Well appearing. No resp difficulty HEENT: normal Neck: supple. JVP flat. Carotids 2+ bilaterally; no bruits. No lymphadenopathy or thryomegaly appreciated. Cor: PMI nonpalpable. Regular rate & rhythm. No rubs, S3  or murmurs. Life Vest in place Lungs: clear Abdomen: soft,  Obese. nontender, nondistended. No hepatosplenomegaly. No bruits or masses. Good bowel sounds. Extremities: no cyanosis, clubbing, rash, edema. No effusion over knee.  Neuro: alert & orientedx3, cranial nerves grossly intact. Moves all 4 extremities w/o difficulty. Affect pleasant.   ASSESSMENT & PLAN:

## 2011-07-12 NOTE — Patient Instructions (Signed)
Increase carvedilol 3.125 mg (1 tab) twice daily.  Follow up 4 weeks.   Do the following things EVERYDAY: 1) Weigh yourself in the morning before breakfast. Write it down and keep it in a log. 2) Take your medicines as prescribed 3) Eat low salt foods-Limit salt (sodium) to 2000mg  per day.  4) Stay as active as you can everyday

## 2011-07-12 NOTE — Assessment & Plan Note (Addendum)
Volume status looks good today.  NYHA II-III although functional status difficult to determine due to knee pain.  Will try to titrate coreg to 3.125 mg BID.  He is to call if dizziness/syncope occur.  Continue prn demadex.   Patient seen and examined with Ulyess Blossom PA-C. We discussed all aspects of the encounter. I agree with the assessment and plan as stated above.  Volume status looks good. Titrate carvedilol. Continue LifeVest. Will need f/u echo soon to assess need for ICD. Has ortho eval pending for knee pain/weakness.

## 2011-07-12 NOTE — Assessment & Plan Note (Signed)
Continue to wear LifeVest.  Will need recheck in 1-2 months for probable ICD implantation.

## 2011-07-15 ENCOUNTER — Telehealth (HOSPITAL_COMMUNITY): Payer: Self-pay | Admitting: *Deleted

## 2011-07-15 MED ORDER — CARVEDILOL 3.125 MG PO TABS: ORAL_TABLET | ORAL | Status: DC

## 2011-07-15 MED ORDER — DIGOXIN 250 MCG PO TABS: 250.0000 ug | ORAL_TABLET | Freq: Every day | ORAL | Status: DC

## 2011-07-15 NOTE — Telephone Encounter (Signed)
William Davila called today.  He needs refills on his carvedilol and digoxin to be called into his pharmacy.  Thanks.

## 2011-07-15 NOTE — Telephone Encounter (Signed)
Pt aware prescriptions were sent in  

## 2011-07-20 ENCOUNTER — Ambulatory Visit (HOSPITAL_COMMUNITY)
Admission: RE | Admit: 2011-07-20 | Discharge: 2011-07-20 | Disposition: A | Discharge status: Home / Self Care | Payer: Medicaid Other | Source: Ambulatory Visit | Attending: Internal Medicine | Admitting: Internal Medicine

## 2011-07-20 ENCOUNTER — Encounter: Payer: Self-pay | Admitting: Internal Medicine

## 2011-07-20 ENCOUNTER — Ambulatory Visit (INDEPENDENT_AMBULATORY_CARE_PROVIDER_SITE_OTHER): Payer: Self-pay | Admitting: Internal Medicine

## 2011-07-20 VITALS — BP 115/73 | HR 85 | Temp 97.8°F | Ht 70.0 in | Wt 287.7 lb

## 2011-07-20 DIAGNOSIS — M109 Gout, unspecified: Secondary | ICD-10-CM

## 2011-07-20 DIAGNOSIS — IMO0001 Reserved for inherently not codable concepts without codable children: Secondary | ICD-10-CM

## 2011-07-20 DIAGNOSIS — M25561 Pain in right knee: Secondary | ICD-10-CM

## 2011-07-20 DIAGNOSIS — M7989 Other specified soft tissue disorders: Secondary | ICD-10-CM | POA: Insufficient documentation

## 2011-07-20 DIAGNOSIS — M25569 Pain in unspecified knee: Secondary | ICD-10-CM

## 2011-07-20 DIAGNOSIS — M25562 Pain in left knee: Secondary | ICD-10-CM

## 2011-07-20 DIAGNOSIS — I5022 Chronic systolic (congestive) heart failure: Secondary | ICD-10-CM

## 2011-07-20 NOTE — Assessment & Plan Note (Signed)
Referral to ortho did not work as planned.  Will re-refer.  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  I d/w patient that he would benefit from PT, but he has resisted on multiple occasions and would like to see ortho.

## 2011-07-20 NOTE — Assessment & Plan Note (Signed)
Pt was in ED two weeks ago for poly joint complaints.  This seems to be a separate issue from his b/l knee pain.  This involves his ankle and wrist as well as possibly his L ring finger.  No focal exam findings except for L ring finger diffuse swelling.  I explained to pt that a crystal diagnosis would be helpful to see if he truly has gout, so to make an urgent appointment the next time he develops severe pain and swelling in a joint.  For now, he can con't with tylenol and vicodin prn.  He is reluctant to use NSAIDS b/c of his hospitalization in Dec for AKI, but his most recent Cr is excellent. - RTC if acute swelling for fluid sampmle - pain meds as needed - xray L hand

## 2011-07-20 NOTE — Assessment & Plan Note (Signed)
Con't to do well, followed by cardiology.  See their last note for full details.  Pt weights have been stable per our clinic records.  No CP, SOB, palp.  Will see cardiology again at the end of the month.

## 2011-07-20 NOTE — Assessment & Plan Note (Signed)
Pt con't to be resistant to PT, so will refer to ortho as pt desires.  Likely needs pt, x-ray showed mild DJD.  Nonfocal exam, no trauma, do not suspect acute ligamentous or meniscal injury, though could have degenerative changes.

## 2011-07-20 NOTE — Progress Notes (Signed)
Subjective:     Patient ID: William Davila, male   DOB: 1965-11-02, 46 y.o.   MRN: [id number]  HPI 46 yo M with h/o dilated cardiomyopathy, b/l knee pain, and possible gout who presents for f/u.  Pt con't to see cardiology as scheduled, doing well.  Has LifeVest and his weights have been steady.  Was in ED two weeks ago for wrist, ankle,and knee pain, which have been chronic issues for this pt.  Was referred to ortho by cardiology for his knee pain, but there was a problem with his appointment and he could not see a doctor.  He con't to have b/l knee pain.  Review of Systems B/l knee pain    Objective:   Physical Exam Gen: NAD CV:  RRR MSK:  Good ROM in b/l knees, no erythema/swelling, no TTP.  Neg lachman, neg mcmurray. Has diffuse swelling of L ring finger w decreased ROM, no erythema, no TTP.    Assessment:         Plan:

## 2011-07-22 NOTE — Progress Notes (Signed)
Addended by: Neomia Dear on: 07/22/2011 01:03 PM   Modules accepted: Orders

## 2011-08-06 ENCOUNTER — Telehealth (HOSPITAL_COMMUNITY): Payer: Self-pay | Admitting: Vascular Surgery

## 2011-08-06 NOTE — Telephone Encounter (Signed)
Lavonna Monarch from [facility] called about William Davila she is going to recert him due to 4 pound weight gain and swelling. Clydie Braun said call her if you have any questions [phone].                         Thanks                                     [employer]

## 2011-08-11 NOTE — Telephone Encounter (Signed)
Left mess for pt to call back with update on how he is doing

## 2011-08-12 ENCOUNTER — Ambulatory Visit (HOSPITAL_COMMUNITY)
Admission: RE | Admit: 2011-08-12 | Discharge: 2011-08-12 | Disposition: A | Discharge status: Home / Self Care | Payer: Medicaid Other | Source: Ambulatory Visit | Attending: Internal Medicine | Admitting: Internal Medicine

## 2011-08-12 VITALS — BP 100/64 | HR 87 | Wt 279.0 lb

## 2011-08-12 DIAGNOSIS — E785 Hyperlipidemia, unspecified: Secondary | ICD-10-CM | POA: Insufficient documentation

## 2011-08-12 DIAGNOSIS — Z79899 Other long term (current) drug therapy: Secondary | ICD-10-CM | POA: Insufficient documentation

## 2011-08-12 DIAGNOSIS — I5022 Chronic systolic (congestive) heart failure: Secondary | ICD-10-CM

## 2011-08-12 DIAGNOSIS — I509 Heart failure, unspecified: Secondary | ICD-10-CM | POA: Insufficient documentation

## 2011-08-12 DIAGNOSIS — I5020 Unspecified systolic (congestive) heart failure: Secondary | ICD-10-CM | POA: Insufficient documentation

## 2011-08-12 DIAGNOSIS — M25569 Pain in unspecified knee: Secondary | ICD-10-CM | POA: Diagnosis not present

## 2011-08-12 DIAGNOSIS — I428 Other cardiomyopathies: Secondary | ICD-10-CM | POA: Insufficient documentation

## 2011-08-12 DIAGNOSIS — I10 Essential (primary) hypertension: Secondary | ICD-10-CM | POA: Insufficient documentation

## 2011-08-12 DIAGNOSIS — Z8639 Personal history of other endocrine, nutritional and metabolic disease: Secondary | ICD-10-CM | POA: Diagnosis not present

## 2011-08-12 DIAGNOSIS — Z862 Personal history of diseases of the blood and blood-forming organs and certain disorders involving the immune mechanism: Secondary | ICD-10-CM | POA: Insufficient documentation

## 2011-08-12 MED ORDER — CARVEDILOL 6.25 MG PO TABS: 6.2500 mg | ORAL_TABLET | Freq: Two times a day (BID) | ORAL | Status: DC

## 2011-08-12 MED ORDER — LISINOPRIL 5 MG PO TABS: 5.0000 mg | ORAL_TABLET | Freq: Every day | ORAL | Status: DC

## 2011-08-12 NOTE — Assessment & Plan Note (Addendum)
NYHA II. Volume status stable. Blood pressure ok. Will increase Carvedilol 6.25 mg twice a day. Continue life vest. Repeat ECHO. Follow up in one month. If EF is not > 35% will refer.   Patient seen and examined with Tonye Becket, NP. We discussed all aspects of the encounter. I agree with the assessment and plan as stated above. Volume status looks good on exam. Will increase carvedilol. Reinforced need for daily weights and reviewed use of sliding scale diuretics. Repeat echo and if EF <35% refer for ICD.

## 2011-08-12 NOTE — Progress Notes (Signed)
Patient ID: William Davila, male   DOB: 1966-01-15, 46 y.o.   MRN: [id number] HPI:  William Davila  is a 46 year old AA male with systolic heart failure (EF10-20%) due to non-ischemic cardiolmyopathy diagnosed 2001, hyperlipidemia, HTN, and obesity.  11/29 Lisinopril decreased 20 mg daily by PCP due to hypotension.   12/18 admitted with renal failure (Cr up to 4) in the setting of viral illness with poor po intake and volume depletion. Hydrated and Cr improved back to baseline. While in hospital had 75 beat run of VT and LifeVest placed. Weight on d/c was 284.  He returns for follow up today.  Denies SOB/CP/Orhtopnea/PND.  Denies dizziness. Ambulation limited due to L knee pain, using walker. Waiting on orthopedic referral.  No LifeVest alarms or firings.  He is weighing every day, weight  279.  He has been complaint with his medications. Following low salt diet. Disability pending.    ROS: All systems negative except as listed in HPI, PMH and Problem List.  Past Medical History  Diagnosis Date  . Chronic systolic heart failure     Diagnosed 2001; prior patient of Dr. Mendel Ryder with [facility] - discharged from practice  . Dilated cardiomyopathy     LVEF 10-20%  . Hyperlipidemia   . Essential hypertension, benign   . Morbid obesity   . Gout   . CHF (congestive heart failure)   . Cardiac arrhythmia     Current Outpatient Prescriptions  Medication Sig Dispense Refill  . carvedilol (COREG) 3.125 MG tablet Take 0.5 tablet in morning and 1 tablet at night  60 tablet  6  . digoxin (LANOXIN) 0.25 MG tablet Take 1 tablet (250 mcg total) by mouth daily.  30 tablet  6  . guaiFENesin (MUCINEX) 600 MG 12 hr tablet Take 600 mg by mouth daily as needed.       Marland Kitchen lisinopril (PRINIVIL,ZESTRIL) 5 MG tablet Take 1 tablet (5 mg total) by mouth daily.  30 tablet  0  . torsemide (DEMADEX) 20 MG tablet Take 20 mg by mouth daily as needed. When weight goes up         PHYSICAL EXAM: Filed Vitals:   08/12/11 1534    BP: 100/64  Pulse: 87  Weight: 279 lb (126.554 kg)  SpO2: 98%  279 (287) General:  Chronically ill appearing.  No resp difficulty HEENT: normal Neck: supple. JVP 5-6. Carotids 2+ bilaterally; no bruits. No lymphadenopathy or thryomegaly appreciated. Cor: PMI nonpalpable. Regular rate & rhythm. No rubs,   or murmurs. Life Vest in place Lungs: clear Abdomen: soft,  Obese. nontender, nondistended. No hepatosplenomegaly. No bruits or masses. Good bowel sounds. Extremities: no cyanosis, clubbing, rash, edema. No effusion over knee.  Neuro: alert & orientedx3, cranial nerves grossly intact. Moves all 4 extremities w/o difficulty. Affect pleasant.   ASSESSMENT & PLAN:

## 2011-08-12 NOTE — Telephone Encounter (Signed)
Pt seen 2/28

## 2011-08-12 NOTE — Patient Instructions (Addendum)
Take Carvedilol 6.25 mg twice a day  Repeat ECHO in 1 month  Follow up in 1 month  Do the following things EVERYDAY: 1) Weigh yourself in the morning before breakfast. Write it down and keep it in a log. 2) Take your medicines as prescribed 3) Eat low salt foods--Limit salt (sodium) to 2000mg  per day.  4) Stay as active as you can everyday

## 2011-08-27 ENCOUNTER — Other Ambulatory Visit (HOSPITAL_COMMUNITY): Payer: Self-pay | Admitting: Adult Health

## 2011-08-27 DIAGNOSIS — I5022 Chronic systolic (congestive) heart failure: Secondary | ICD-10-CM

## 2011-09-01 ENCOUNTER — Ambulatory Visit (HOSPITAL_COMMUNITY): Payer: Self-pay

## 2011-09-01 ENCOUNTER — Telehealth (HOSPITAL_COMMUNITY): Payer: Self-pay | Admitting: *Deleted

## 2011-09-01 MED ORDER — TORSEMIDE 20 MG PO TABS: 20.0000 mg | ORAL_TABLET | Freq: Every day | ORAL | Status: DC | PRN

## 2011-09-01 NOTE — Telephone Encounter (Signed)
William Davila called today, he needs torsemide called into [facility] pharmacy for a refill.  Thanks.

## 2011-09-01 NOTE — Telephone Encounter (Signed)
Error

## 2011-09-01 NOTE — Telephone Encounter (Signed)
Pt aware, rx was sent in

## 2011-09-03 ENCOUNTER — Ambulatory Visit (HOSPITAL_COMMUNITY)
Admission: RE | Admit: 2011-09-03 | Discharge: 2011-09-03 | Disposition: A | Discharge status: Home / Self Care | Payer: Medicaid Other | Source: Ambulatory Visit | Attending: Adult Health | Admitting: Adult Health

## 2011-09-03 DIAGNOSIS — I501 Left ventricular failure: Secondary | ICD-10-CM | POA: Diagnosis not present

## 2011-09-03 DIAGNOSIS — I5020 Unspecified systolic (congestive) heart failure: Secondary | ICD-10-CM | POA: Insufficient documentation

## 2011-09-03 DIAGNOSIS — I10 Essential (primary) hypertension: Secondary | ICD-10-CM | POA: Insufficient documentation

## 2011-09-03 DIAGNOSIS — I059 Rheumatic mitral valve disease, unspecified: Secondary | ICD-10-CM

## 2011-09-03 DIAGNOSIS — I5022 Chronic systolic (congestive) heart failure: Secondary | ICD-10-CM

## 2011-09-03 NOTE — Progress Notes (Signed)
  Echocardiogram 2D Echocardiogram has been performed.  William Davila [facility] 09/03/2011, 10:13 AM

## 2011-09-06 ENCOUNTER — Ambulatory Visit (HOSPITAL_COMMUNITY)
Admission: RE | Admit: 2011-09-06 | Discharge: 2011-09-06 | Disposition: A | Discharge status: Home / Self Care | Payer: Medicaid Other | Source: Ambulatory Visit | Attending: Internal Medicine | Admitting: Internal Medicine

## 2011-09-06 VITALS — BP 102/58 | HR 81 | Wt 283.2 lb

## 2011-09-06 DIAGNOSIS — I5022 Chronic systolic (congestive) heart failure: Secondary | ICD-10-CM | POA: Diagnosis present

## 2011-09-06 NOTE — Patient Instructions (Signed)
Will need ICD implantation.  Your physician has recommended that you have a defibrillator inserted. An implantable cardioverter defibrillator (ICD) is a small device that is placed in your chest or, in rare cases, your abdomen. This device uses electrical pulses or shocks to help control life-threatening, irregular heartbeats that could lead the heart to suddenly stop beating (sudden cardiac arrest). Leads are attached to the ICD that goes into your heart. This is done in the hospital and usually requires an overnight stay. Please see the instruction sheet given to you today for more information.  Increase lisinopril to 5 mg (1 tab) twice daily  Follow up 6 weeks.

## 2011-09-06 NOTE — Assessment & Plan Note (Addendum)
Doing rather well. NYHA II-III. Echo reviewed in full with the patient and his mother.  EF mildly improved but remains depressed at 20-25%.  Will refer to EP for evaluation of ICD implantation for prevention of SCD.  He is agreeable to this.  He will continue to wear his lifevest until this time.  Medication titration has been difficult due to hypotension, will try to increase lisinopril 5 mg BID.  He is to call if has increased dizziness/lightheadedness.    Patient seen and examined with Ulyess Blossom PA-C. We discussed all aspects of the encounter. I agree with the assessment and plan as stated above.  He is stable but EF remains low. Currently wearing LifeVest. Will refer for ICD. Attempt to titrate ACE-I as tolerated.

## 2011-09-06 NOTE — Progress Notes (Signed)
HPI:  Mr Denk  is a 46 year old AA male with systolic heart failure (EF10-20%) due to non-ischemic cardiolmyopathy diagnosed 2001, hyperlipidemia, HTN, and obesity.  11/29 Lisinopril decreased 20 mg daily by PCP due to hypotension.   12/18 admitted with renal failure (Cr up to 4) in the setting of viral illness with poor po intake and volume depletion. Hydrated and Cr improved back to baseline. While in hospital had 75 beat run of VT and LifeVest placed. Weight on d/c was 284.  He returns for follow up today.  Knee pain limits his activity the most.  He is trying to rehab his knee.  Weighing daily, ranging from 273-275.  No extra lasix.  No edema.  Denies SOB/CP/Orhtopnea/PND.  Denies dizziness.  Compliant with medications.  Following low salt diet.  Disability pending.  No LifeVest alarms or firings.    Repeat Echo 09/03/11: EF 20-25%, diffuse hypokinesis.  Grade 2 diastolic dysfunction.  Mildly dilated RV, RA.  LA moderately dilated.  Mild MR.    ROS: All systems negative except as listed in HPI, PMH and Problem List.  Past Medical History  Diagnosis Date  . Chronic systolic heart failure     Diagnosed 2001; prior patient of Dr. Mendel Ryder with [facility] - discharged from practice  . Dilated cardiomyopathy     LVEF 10-20%  . Hyperlipidemia   . Essential hypertension, benign   . Morbid obesity   . Gout   . CHF (congestive heart failure)   . Cardiac arrhythmia     Current Outpatient Prescriptions  Medication Sig Dispense Refill  . carvedilol (COREG) 6.25 MG tablet Take 1 tablet (6.25 mg total) by mouth 2 (two) times daily with a meal.  60 tablet  6  . digoxin (LANOXIN) 0.25 MG tablet Take 1 tablet (250 mcg total) by mouth daily.  30 tablet  6  . guaiFENesin (MUCINEX) 600 MG 12 hr tablet Take 600 mg by mouth daily as needed.       Marland Kitchen lisinopril (PRINIVIL,ZESTRIL) 5 MG tablet Take 1 tablet (5 mg total) by mouth daily.  30 tablet  6  . torsemide (DEMADEX) 20 MG tablet Take 1 tablet (20 mg  total) by mouth daily as needed. When weight goes up  30 tablet  6     PHYSICAL EXAM: Filed Vitals:   09/06/11 1532  BP: 102/58  Pulse: 81  Weight: 283 lb 4 oz (128.481 kg)  SpO2: 99%   General:  Chronically ill appearing.  No resp difficulty HEENT: normal Neck: supple. JVP 5-6. Carotids 2+ bilaterally; no bruits. No lymphadenopathy or thryomegaly appreciated. Cor: PMI nonpalpable. Regular rate & rhythm. No rubs,   or murmurs. Life Vest in place Lungs: clear Abdomen: soft,  Obese. nontender, nondistended. No hepatosplenomegaly. No bruits or masses. Good bowel sounds. Extremities: no cyanosis, clubbing, rash, edema. No effusion over knee.  Neuro: alert & orientedx3, cranial nerves grossly intact. Moves all 4 extremities w/o difficulty. Affect pleasant.   ASSESSMENT & PLAN:

## 2011-09-14 ENCOUNTER — Encounter: Payer: Self-pay | Admitting: Internal Medicine

## 2011-09-14 ENCOUNTER — Ambulatory Visit (INDEPENDENT_AMBULATORY_CARE_PROVIDER_SITE_OTHER): Payer: Self-pay | Admitting: Internal Medicine

## 2011-09-14 VITALS — BP 107/74 | HR 80 | Temp 96.7°F | Ht 70.0 in | Wt 282.4 lb

## 2011-09-14 DIAGNOSIS — M25562 Pain in left knee: Secondary | ICD-10-CM

## 2011-09-14 DIAGNOSIS — M25569 Pain in unspecified knee: Secondary | ICD-10-CM

## 2011-09-14 DIAGNOSIS — M542 Cervicalgia: Secondary | ICD-10-CM | POA: Insufficient documentation

## 2011-09-14 DIAGNOSIS — M109 Gout, unspecified: Secondary | ICD-10-CM

## 2011-09-14 DIAGNOSIS — I428 Other cardiomyopathies: Secondary | ICD-10-CM

## 2011-09-14 NOTE — Assessment & Plan Note (Signed)
Patient was unable to get referral to orthopedics because of insurance constraints. Was referred to sports medicine, with appointment on 09/20/11. 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 His quadriceps tendon rupture from last July is most likely healed. Overall the patient's pain has improved, and his mobility is better as well. Nonetheless, he still has marked disability with walking and daily movements. He is currently using both a walker and crutches for ambulation, and I would appreciate the sports medicine assistance regarding any further therapeutic or assistive modalities for this patient.  His right knee is also bothersome, likely for similar reasons (but no history of quad rupture).

## 2011-09-14 NOTE — Assessment & Plan Note (Signed)
Currently followed by heart failure clinic. Recently had an echo showing EF 20-25%. He's been compliant with therapy for over 3 months, and consequently heart failure clinic has referred him to electrophysiology for consultation for possible ICD placement. Appointment is on 4/24. In the meantime, he continues to wear his life vest, and is compliant with medicines. He recently had titration of his lisinopril, and does not complain of any dizziness.

## 2011-09-14 NOTE — Assessment & Plan Note (Signed)
No further episodes of ankle or wrist swelling since last visit. Focal left ring finger swelling resolved over a week or 2 without intervention. As discussed, the patient will seek urgent clinic appointment for distal diagnosis if he has any acute redness, swelling, or pain of any of his joints since he does not have a concrete diagnosis of gout.

## 2011-09-14 NOTE — Progress Notes (Signed)
Subjective:     Patient ID: William Davila, male   DOB: 28-Jun-1965, 46 y.o.   MRN: [id number]  HPI Patient is a 46 year old man with history of dilated cardiomyopathy, obesity, and chronic bilateral knee pain who presents for followup.  Cardiomyopathy: Followed by heart care clinic, recently had an echo that showed EF of 20-25%. Weight has been stable, no dizziness or shortness of breath.  Knee pain: Patient has had chronic knee pain that has slightly improved. He continues to use a walker around the house and crutches when outside the house. His left knee is a little worse than the right. His left knee he is working better: He used to have buckling sensations but now feels only some pain. His pain is worst with standing or sitting or walking downstairs. He is using Tylenol for pain.  Patient also complains of new right neck, trapezius, and clavicle pain. It does not hurt him presently, and he is unable to identify exacerbating or alleviating factors. He denies trauma.  Review of Systems As per history of present illness    Objective:   Physical Exam GEN: NAD CV: RRR, 2/6 late systolic murmur at LSB Chest: CTAB Neck: good ROM, no TTP over cervical spine, lateral neck, trapezius, or clavicle.    Assessment:         Plan:

## 2011-09-14 NOTE — Assessment & Plan Note (Signed)
Patient is a new right-sided cervical neck pain, trapezius pain, and clavicle pain. Denies trauma. Exam nonfocal. Likely related to sleeping on it strangely, use of crutches, or other low-grade strain. Incidentally does have a plumber with sports medicine on 4/8, and the patient can address his complaints with them. Otherwise, I recommended ice and over-the-counter pain medicines. I do not think imaging is warranted at this time.

## 2011-09-20 ENCOUNTER — Ambulatory Visit (INDEPENDENT_AMBULATORY_CARE_PROVIDER_SITE_OTHER): Payer: Self-pay | Admitting: Family Medicine

## 2011-09-20 VITALS — BP 109/73

## 2011-09-20 DIAGNOSIS — M25561 Pain in right knee: Secondary | ICD-10-CM

## 2011-09-20 DIAGNOSIS — M25569 Pain in unspecified knee: Secondary | ICD-10-CM

## 2011-09-20 DIAGNOSIS — S76119A Strain of unspecified quadriceps muscle, fascia and tendon, initial encounter: Secondary | ICD-10-CM

## 2011-09-20 DIAGNOSIS — IMO0002 Reserved for concepts with insufficient information to code with codable children: Secondary | ICD-10-CM

## 2011-09-20 NOTE — Progress Notes (Signed)
Patient ID: RAMIREZ FULLBRIGHT, male   DOB: Jun 01, 1966, 46 y.o.   MRN: [id number] Pt is a 46 year old male with multiple problems referred by Dr. Toney Sang for knee problems.  Pt reports that his knees began bothering him about a year ago.  He reports that, with no provocation, his left knee swelled up significantly and, as the swelling went down, he noticed that his knee was catching and felt like it was "buckling."  This has continued from that point on.  He has not had any official physical therapy/rehab.  Some time about 6-8 months ago his right knee also started having problems with buckling.  He currently has pain in the knees  with full extension of his legs bilaterally.  Review of the patient's records shows that he has had a quadriceps muscle rupture on the left in July of 2012, diagnosed by ultrasound here, along with x-ray findings of patellar condromalacia.  Pt does not report any new trauma since his appointment in July of 2012.  Objective: Gen: Obese male, on crutches  Knee: Exam is somewhat limited due to body habitus but the following are noted: - Full extension bilaterally, 135 degrees flexion bilaterally. - Significant crepitus bilaterally with some pain on full extension. - MCL/LCL appear to be intact on exam and this exam elicits no pain - Normal anterior drawer test bilaterally - There may be a slight catch valgus-stressed extension on the left. IMAGING: Reviewed he is for a few complete right knee from January, 2013 and his left knee from May, 2012. Neither of these is standing views. There are moderate changes of osteoarthritis with some sclerosis. Joint space cannot really be determined on the nonstaining film. I see no cystic lesions. There are some osteophytes. He also has some apparent flattening of the tibia on the right. Bilaterally there is some evidence of patellofemoral arthritic change.  ASSESSMENT/ PLAN Knee pain, multifactorial. He is currently walking with  crutches. I think he never rehabbed the quadricep tear. He never followed up from that either. He has a lot of things going on with a significant cardiomyopathy right now and is getting ready to have an implantable defibrillator. For his pain at this time I think the best thing we can offer him is a corticosteroid injection in the right knee. He has without a doubt the most significant bilateral crepitus I have ever failed on extreme. It is not only palpable it is audible. 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 Like to see him back in one month.  INJECTION: Patient was given informed consent, signed copy in the chart. Appropriate time out was taken. Area prepped and draped in usual sterile fashion. 1 cc of methylprednisolone 40 mg/ml plus  4 cc of 1% lidocaine without epinephrine was injected into the right knee  using a(n) anterior approach. The patient tolerated the procedure well. There were no complications. Post procedure instructions were given.

## 2011-09-20 NOTE — Patient Instructions (Signed)
It was great to see you today! Come back to see [place] in about 4 weeks so we can see how your injection helped.

## 2011-09-21 ENCOUNTER — Encounter: Payer: Self-pay | Admitting: Family Medicine

## 2011-10-06 ENCOUNTER — Ambulatory Visit (INDEPENDENT_AMBULATORY_CARE_PROVIDER_SITE_OTHER): Payer: Self-pay | Admitting: Internal Medicine

## 2011-10-06 ENCOUNTER — Encounter: Payer: Self-pay | Admitting: Internal Medicine

## 2011-10-06 DIAGNOSIS — I472 Ventricular tachycardia, unspecified: Secondary | ICD-10-CM

## 2011-10-06 DIAGNOSIS — I428 Other cardiomyopathies: Secondary | ICD-10-CM

## 2011-10-06 DIAGNOSIS — I5022 Chronic systolic (congestive) heart failure: Secondary | ICD-10-CM

## 2011-10-06 NOTE — Patient Instructions (Signed)
Please call Dr. Odessa Fleming nurse, Herbert Seta at [phone], when you decide how you would like to proceed.

## 2011-10-06 NOTE — Assessment & Plan Note (Signed)
Euvolemic. Interestingly, his major limitation currently is his knees

## 2011-10-06 NOTE — Progress Notes (Signed)
History and Physical  Patient ID: William Davila MRN: [id number], SOB: 02-24-1966 46 y.o. Date of Encounter: 10/06/2011, 4:33 PM  Primary Physician: Kathreen Cosier, MD, MD Primary Cardiologist: DB Primary Electrophysiologist:  SK  Chief Complaint: ICD  History of Present Illness: William Davila is a 46 y.o. male seen at the request of Dr. Dorthea Cove for consideration of an ICD.  He has nonischemic cardiomyopathy diagnosed about 10 or 12 years ago in the setting of morbid obesity hypertension and dyslipidemia. He has a history of nonsustained ventricular tachycardia and was given a LifeVest . Echo.09/03/11: EF 20-25%, diffuse hypokinesis. Grade 2 diastolic dysfunction. Mildly dilated RV, RA. LA moderately dilated. Mild MR  He denies significant exercise breath but he is largely limited by his knees. He has not had syncope or palpitations. He denies orthopnea. He apparently is following his low salt diet is compliant with medications  His knee issue apparently will require surgery. He is listed as having gout, it sounds like is also some chondromalacia and arthritis I.     Past Medical History  Diagnosis Date  . Chronic systolic heart failure     Diagnosed 2001; prior patient of Dr. Mendel Ryder with [facility] - discharged from practice  . Dilated cardiomyopathy     LVEF 10-20%  . Hyperlipidemia   . Essential hypertension, benign   . Morbid obesity   . Gout   . CHF (congestive heart failure)   . Cardiac arrhythmia   . Defibrillator activation      Past Surgical History  Procedure Date  . No past surgeries       Current Outpatient Prescriptions  Medication Sig Dispense Refill  . carvedilol (COREG) 6.25 MG tablet Take 1 tablet (6.25 mg total) by mouth 2 (two) times daily with a meal.  60 tablet  6  . digoxin (LANOXIN) 0.25 MG tablet Take 1 tablet (250 mcg total) by mouth daily.  30 tablet  6  . guaiFENesin (MUCINEX) 600 MG 12 hr tablet Take 600 mg by mouth daily as needed.         Marland Kitchen lisinopril (PRINIVIL,ZESTRIL) 5 MG tablet Take 1 tablet (5 mg total) by mouth daily.  30 tablet  6  . torsemide (DEMADEX) 20 MG tablet Take 1 tablet (20 mg total) by mouth daily as needed. When weight goes up  30 tablet  6  . DISCONTD: torsemide (DEMADEX) 20 MG tablet Take 3 tablets (60 mg total) by mouth daily.  90 tablet  6     Allergies: Allergies  Allergen Reactions  . Beta Adrenergic Blockers Other (See Comments)    REACTION: fatigue and increased dyspnea with beta blockers.  . Bidil Other (See Comments)    REACTION: headache     History  Substance Use Topics  . Smoking status: Never Smoker   . Smokeless tobacco: Never Used  . Alcohol Use: No      Family History  Problem Relation Age of Onset  . Hypertension Mother       ROS:  Please see the history of present illness. Inviewed and negative.   Vital Signs: Blood pressure 121/81, pulse 74, height 5\' 10"  (1.778 m), weight 290 lb 12.8 oz (131.906 kg).  PHYSICAL EXAM: General:  Well nourished, well developed male in no acute distress and HEENT: normal Lymph: no adenopathy Neck:  JVD  7-8 cm  Endocrine:  No thryomegaly Vascular: No carotid bruits; FA pulses 2+ bilaterally without bruits Cardiac:   regular rate and rhythm with  a diminished S1 normal S2 2/6 systolic murmur along the left sternal border Back: without kyphosis/scoliosis, no CVA tenderness Lungs:  clear to auscultation bilaterally, no wheezing, rhonchi or rales Abd: soft, nontender, no hepatomegaly Ext: no edema Musculoskeletal:  No deformities, BUE and BLE strength normal and equal; some knee swelling  Skin: warm and dry Neuro:  CNs 2-12 intact, no focal abnormalities noted; walks with crutches  Psych:  Normal affect   EKG:   ECG reviewed from visit in February and showed to have a QRS duration of approximately 110 ms   Labs:   Lab Results  Component Value Date   WBC 11.3* 06/03/2011   HGB 16.8 06/03/2011   HCT 47.3 06/03/2011   MCV 79.1  06/03/2011   PLT 373 06/03/2011    Lab Results  Component Value Date   CHOL 100 05/01/2011   HDL 27* 05/01/2011   LDLCALC 62 05/01/2011   TRIG 54 05/01/2011   Lab Results  Component Value Date   DDIMER  Value: 0.84        AT THE INHOUSE ESTABLISHED CUTOFF VALUE OF 0.48 ug/mL FEU, THIS ASSAY HAS BEEN DOCUMENTED IN THE LITERATURE TO HAVE A SENSITIVITY AND NEGATIVE PREDICTIVE VALUE OF AT LEAST 98 TO 99%.  THE TEST RESULT SHOULD BE CORRELATED WITH AN ASSESSMENT OF THE CLINICAL PROBABILITY OF DVT / VTE.* 07/31/2010   BNP Pro B Natriuretic peptide (BNP)  Date/Time Value Range Status  05/01/2011  5:00 AM 3355.0* 0-125 (pg/mL) Final  07/10/2009  1:03 PM 83.0  (0.0-100.0 (pg/mL) Final  05/26/2007  1:15 PM 44.0   Final       ASSESSMENT AND PLAN:

## 2011-10-06 NOTE — Assessment & Plan Note (Signed)
I encouraged him to consider water aerobics

## 2011-10-06 NOTE — Assessment & Plan Note (Signed)
The patient had prolonged ventricular tachycardia in hospital. In the [place] of patients with nonischemic myopathy this is associated with increased risk of death but not sudden cardiac death. It is appropriate that he is wearing a life vest; it is also appropriate that he be considered for ICD implantation for primary prevention. His ejection fraction has remained poor despite guidelines directed medical therapy. He is precluded from receiving Aldactone because of hyperkalemia and renal insufficiency.Have reviewed the potential benefits and risks of ICD implantation including but not limited to death, perforation of heart or lung, lead dislodgement, infection,  device malfunction and inappropriate shocks.  The patient and family express understanding  and are willing to proceed.

## 2011-10-06 NOTE — Assessment & Plan Note (Signed)
As above.

## 2011-10-08 ENCOUNTER — Telehealth: Payer: Self-pay | Admitting: Internal Medicine

## 2011-10-08 DIAGNOSIS — I420 Dilated cardiomyopathy: Secondary | ICD-10-CM

## 2011-10-08 NOTE — Telephone Encounter (Signed)
Told pt Dr/nurse out of office and will return his call Monday. Pt agreed to plan.

## 2011-10-08 NOTE — Telephone Encounter (Signed)
Pt calling to set up surgery for defib placement, 5-15 is a good date, pls call [phone]

## 2011-10-12 NOTE — Telephone Encounter (Signed)
I have the patient's ICD implant scheduled for 10/27/11 at 10:30 am. I have attempted to call the patient with his instructions. I did reach him on his cell #, but there was a bad connection and we were cut off. I attempted to call him back and was sent to his voice mail. I left a message for him to call me back tomorrow to discuss his instructions.

## 2011-10-13 ENCOUNTER — Encounter (HOSPITAL_COMMUNITY): Payer: Self-pay | Admitting: Pharmacy Technician

## 2011-10-13 ENCOUNTER — Encounter: Payer: Self-pay | Admitting: *Deleted

## 2011-10-13 NOTE — Telephone Encounter (Signed)
I have left a message again for the patient to call. I will be out of the office until next week on 5/9. I have completed his letter of instructions for the patient. I will forward the message to triage to try to call him regarding these instructions. He will need to come for labwork next week. A copy of his instructions can be printed and given to him when he comes for labs after they are verbally gone over with him. His lab orders have already been placed.

## 2011-10-14 NOTE — Telephone Encounter (Signed)
Patient called will come by office Wed 10/20/11 to have pre procedure lab work and pick up ICD instructions.

## 2011-10-17 ENCOUNTER — Other Ambulatory Visit: Payer: Self-pay | Admitting: Internal Medicine

## 2011-10-17 DIAGNOSIS — I428 Other cardiomyopathies: Secondary | ICD-10-CM

## 2011-10-18 ENCOUNTER — Ambulatory Visit: Payer: Self-pay | Admitting: Family Medicine

## 2011-10-18 ENCOUNTER — Encounter (HOSPITAL_COMMUNITY): Payer: Self-pay

## 2011-10-18 ENCOUNTER — Ambulatory Visit (HOSPITAL_COMMUNITY)
Admission: RE | Admit: 2011-10-18 | Discharge: 2011-10-18 | Disposition: A | Discharge status: Home / Self Care | Payer: Medicaid Other | Source: Ambulatory Visit | Attending: Internal Medicine | Admitting: Internal Medicine

## 2011-10-18 VITALS — BP 98/60 | HR 77 | Ht 70.0 in | Wt 280.8 lb

## 2011-10-18 DIAGNOSIS — I498 Other specified cardiac arrhythmias: Secondary | ICD-10-CM | POA: Diagnosis not present

## 2011-10-18 DIAGNOSIS — I509 Heart failure, unspecified: Secondary | ICD-10-CM | POA: Diagnosis not present

## 2011-10-18 DIAGNOSIS — E785 Hyperlipidemia, unspecified: Secondary | ICD-10-CM | POA: Insufficient documentation

## 2011-10-18 DIAGNOSIS — M25569 Pain in unspecified knee: Secondary | ICD-10-CM | POA: Insufficient documentation

## 2011-10-18 DIAGNOSIS — Z79899 Other long term (current) drug therapy: Secondary | ICD-10-CM | POA: Diagnosis not present

## 2011-10-18 DIAGNOSIS — I5022 Chronic systolic (congestive) heart failure: Secondary | ICD-10-CM | POA: Diagnosis present

## 2011-10-18 DIAGNOSIS — I10 Essential (primary) hypertension: Secondary | ICD-10-CM | POA: Insufficient documentation

## 2011-10-18 DIAGNOSIS — Z95810 Presence of automatic (implantable) cardiac defibrillator: Secondary | ICD-10-CM | POA: Diagnosis not present

## 2011-10-18 LAB — BASIC METABOLIC PANEL
Calcium: 10.1 mg/dL (ref 8.4–10.5)
GFR calc Af Amer: >90 mL/min (ref >90)
GFR calc non Af Amer: 87 mL/min — ABNORMAL LOW (ref >90)
Glucose, Bld: 88 mg/dL (ref 70–99)
Sodium: 142 mEq/L (ref 135–145)

## 2011-10-18 LAB — CBC
MCH: 30.7 pg (ref 26.0–34.0)
MCHC: 34.3 g/dL (ref 30.0–36.0)
Platelets: 367 10*3/uL (ref 150–400)
RBC: 4.88 MIL/uL (ref 4.22–5.81)

## 2011-10-18 LAB — PROTIME-INR: Prothrombin Time: 14.1 seconds (ref 11.6–15.2)

## 2011-10-18 NOTE — Patient Instructions (Addendum)
Continue current medications.  Follow up 2 months.   Labs today.

## 2011-10-18 NOTE — Assessment & Plan Note (Signed)
Volume status looks good today.  Functional class difficult to determine due to knee pain.  Will hold off on titration of medications at this time with soft BP.  Appreciate Dr. Odessa Fleming evaluation and placement of ICD next week.  Will check pre-procedure labs today.  Follow up 2 months.

## 2011-10-18 NOTE — Progress Notes (Signed)
HPI:  William Davila  is a 46 year old AA male with systolic heart failure (EF10-20%) due to non-ischemic cardiolmyopathy diagnosed 2001, hyperlipidemia, HTN, and obesity.  11/29 Lisinopril decreased 20 mg daily by PCP due to hypotension.   12/18 admitted with renal failure (Cr up to 4) in the setting of viral illness with poor po intake and volume depletion. Hydrated and Cr improved back to baseline. While in hospital had 75 beat run of VT and LifeVest placed. Weight on d/c was 284.  Echo 09/03/11: EF 20-25%, diffuse hypokinesis.  Grade 2 diastolic dysfunction.  Mildly dilated RV, RA.  LA moderately dilated.  Mild William.   He returns for follow up today.  He feels good today.  He saw Dr. Graciela Husbands for ICD on 4/24, he is scheduled for implantation on 5/15.  Walking with crutch, knee is some better.  He is planning to see Sports Med MD back on Monday for possible surgery.  No problems with dyspnea.  Weighing 274-278 pounds at home.  No edema.  Continues to wear LifeVest, no alarms.  No dizziness or syncope.     ROS: All systems negative except as listed in HPI, PMH and Problem List.  Past Medical History  Diagnosis Date  . Chronic systolic heart failure     Diagnosed 2001; prior patient of Dr. Mendel Ryder with [facility] - discharged from practice  . Dilated cardiomyopathy     LVEF 10-20%  . Hyperlipidemia   . Essential hypertension, benign   . Morbid obesity   . Gout   . CHF (congestive heart failure)   . Cardiac arrhythmia   . Defibrillator activation     Current Outpatient Prescriptions  Medication Sig Dispense Refill  . acetaminophen (TYLENOL) 500 MG tablet Take 1,000 mg by mouth every 6 (six) hours as needed. For pain      . carvedilol (COREG) 6.25 MG tablet Take 6.25 mg by mouth 2 (two) times daily with a meal.      . digoxin (LANOXIN) 0.25 MG tablet Take 250 mcg by mouth daily.      Marland Kitchen lisinopril (PRINIVIL,ZESTRIL) 5 MG tablet Take 5 mg by mouth daily.      Marland Kitchen torsemide (DEMADEX) 20 MG tablet Take  20 mg by mouth daily as needed. When weight goes up      . DISCONTD: torsemide (DEMADEX) 20 MG tablet Take 3 tablets (60 mg total) by mouth daily.  90 tablet  6     PHYSICAL EXAM: Filed Vitals:   10/18/11 1556  BP: 98/60  Pulse: 77  Height: 5\' 10"  (1.778 m)  Weight: 280 lb 12.8 oz (127.37 kg)  SpO2: 99%   General:  Chronically ill appearing.  No resp difficulty HEENT: normal Neck: supple. JVP flat. Carotids 2+ bilaterally; no bruits. No lymphadenopathy or thryomegaly appreciated. Cor: PMI nonpalpable. Regular rate & rhythm. No rubs,   or murmurs. Life Vest in place Lungs: clear Abdomen: soft,  Obese. nontender, nondistended. No hepatosplenomegaly. No bruits or masses. Good bowel sounds. Extremities: no cyanosis, clubbing, rash, edema. No effusion over knee.  Neuro: alert & orientedx3, cranial nerves grossly intact. Moves all 4 extremities w/o difficulty. Affect pleasant.   ASSESSMENT & PLAN:

## 2011-10-19 ENCOUNTER — Telehealth (HOSPITAL_COMMUNITY): Payer: Self-pay | Admitting: *Deleted

## 2011-10-19 ENCOUNTER — Encounter (HOSPITAL_COMMUNITY): Payer: Self-pay | Admitting: Internal Medicine

## 2011-10-19 NOTE — Telephone Encounter (Signed)
Will send to Dr Bensimhon  

## 2011-10-19 NOTE — Telephone Encounter (Signed)
William William Davila called today.  He would like a letter from Dr William Davila sent to his attorney at [facility] [employer] stating that he is unable to work.  Please send it to William Davila, a representative for [employer], fax number [phone].  Also, please follow up with William Davila. Thanks.

## 2011-10-20 ENCOUNTER — Other Ambulatory Visit: Payer: Self-pay

## 2011-10-20 NOTE — Telephone Encounter (Signed)
Left mess for pt that his letter was done and faxed

## 2011-10-25 ENCOUNTER — Ambulatory Visit: Payer: Self-pay | Admitting: Family Medicine

## 2011-10-26 MED ORDER — GENTAMICIN SULFATE 40 MG/ML IJ SOLN
80.0000 mg | INTRAMUSCULAR | Status: DC
  Filled 2011-10-26 (×2): qty 2

## 2011-10-26 MED ORDER — CEFAZOLIN SODIUM-DEXTROSE 2-3 GM-% IV SOLR
2.0000 g | INTRAVENOUS | Status: DC
  Filled 2011-10-26: qty 50

## 2011-10-27 ENCOUNTER — Ambulatory Visit (HOSPITAL_COMMUNITY)
Admission: RE | Admit: 2011-10-27 | Discharge: 2011-10-28 | Disposition: A | Discharge status: Home / Self Care | Payer: Medicaid Other | Source: Ambulatory Visit | Attending: Internal Medicine | Admitting: Internal Medicine

## 2011-10-27 ENCOUNTER — Encounter (HOSPITAL_COMMUNITY)
Admission: RE | Disposition: A | Discharge status: Home / Self Care | Payer: Self-pay | Source: Ambulatory Visit | Attending: Internal Medicine

## 2011-10-27 ENCOUNTER — Encounter (HOSPITAL_COMMUNITY): Payer: Self-pay | Admitting: General Practice

## 2011-10-27 DIAGNOSIS — I440 Atrioventricular block, first degree: Secondary | ICD-10-CM | POA: Diagnosis not present

## 2011-10-27 DIAGNOSIS — I428 Other cardiomyopathies: Secondary | ICD-10-CM | POA: Insufficient documentation

## 2011-10-27 DIAGNOSIS — I5022 Chronic systolic (congestive) heart failure: Secondary | ICD-10-CM | POA: Insufficient documentation

## 2011-10-27 DIAGNOSIS — I509 Heart failure, unspecified: Secondary | ICD-10-CM | POA: Insufficient documentation

## 2011-10-27 DIAGNOSIS — Z95810 Presence of automatic (implantable) cardiac defibrillator: Secondary | ICD-10-CM

## 2011-10-27 DIAGNOSIS — E785 Hyperlipidemia, unspecified: Secondary | ICD-10-CM | POA: Diagnosis not present

## 2011-10-27 DIAGNOSIS — I10 Essential (primary) hypertension: Secondary | ICD-10-CM | POA: Diagnosis not present

## 2011-10-27 HISTORY — PX: IMPLANTABLE CARDIOVERTER DEFIBRILLATOR IMPLANT: SHX5473

## 2011-10-27 HISTORY — DX: Unspecified osteoarthritis, unspecified site: M19.90

## 2011-10-27 HISTORY — PX: OTHER SURGICAL HISTORY: SHX169

## 2011-10-27 HISTORY — DX: Presence of automatic (implantable) cardiac defibrillator: Z95.810

## 2011-10-27 LAB — SURGICAL PCR SCREEN: Staphylococcus aureus: NEGATIVE

## 2011-10-27 SURGERY — IMPLANTABLE CARDIOVERTER DEFIBRILLATOR IMPLANT
Anesthesia: LOCAL

## 2011-10-27 MED ORDER — MUPIROCIN 2 % EX OINT: TOPICAL_OINTMENT | Freq: Once | CUTANEOUS | Status: DC

## 2011-10-27 MED ORDER — MIDAZOLAM HCL 5 MG/5ML IJ SOLN
INTRAMUSCULAR | Status: AC
  Filled 2011-10-27: qty 5

## 2011-10-27 MED ORDER — MUPIROCIN 2 % EX OINT
TOPICAL_OINTMENT | CUTANEOUS | Status: AC
  Filled 2011-10-27: qty 22

## 2011-10-27 MED ORDER — CEFAZOLIN SODIUM-DEXTROSE 2-3 GM-% IV SOLR
INTRAVENOUS | Status: AC
  Filled 2011-10-27: qty 100

## 2011-10-27 MED ORDER — ACETAMINOPHEN 325 MG PO TABS
325.0000 mg | ORAL_TABLET | ORAL | Status: DC | PRN
  Administered 2011-10-27 – 2011-10-28 (×3): 650 mg via ORAL
  Filled 2011-10-27 (×3): qty 2

## 2011-10-27 MED ORDER — FENTANYL CITRATE 0.05 MG/ML IJ SOLN
INTRAMUSCULAR | Status: AC
  Filled 2011-10-27: qty 2

## 2011-10-27 MED ORDER — HEPARIN (PORCINE) IN NACL 2-0.9 UNIT/ML-% IJ SOLN
INTRAMUSCULAR | Status: AC
  Filled 2011-10-27: qty 1000

## 2011-10-27 MED ORDER — CARVEDILOL 6.25 MG PO TABS
6.2500 mg | ORAL_TABLET | Freq: Two times a day (BID) | ORAL | Status: DC
  Administered 2011-10-27 – 2011-10-28 (×2): 6.25 mg via ORAL
  Filled 2011-10-27 (×4): qty 1

## 2011-10-27 MED ORDER — TORSEMIDE 20 MG PO TABS
20.0000 mg | ORAL_TABLET | Freq: Once | ORAL | Status: AC
  Administered 2011-10-27: 20 mg via ORAL
  Filled 2011-10-27: qty 1

## 2011-10-27 MED ORDER — ONDANSETRON HCL 4 MG/2ML IJ SOLN: 4.0000 mg | Freq: Four times a day (QID) | INTRAMUSCULAR | Status: DC | PRN

## 2011-10-27 MED ORDER — SODIUM CHLORIDE 0.9 % IV SOLN: 250.0000 mL | INTRAVENOUS | Status: DC

## 2011-10-27 MED ORDER — CEFAZOLIN SODIUM 1-5 GM-% IV SOLN
1.0000 g | Freq: Four times a day (QID) | INTRAVENOUS | Status: AC
Start: 2011-10-27 — End: 2011-10-28
  Administered 2011-10-27 – 2011-10-28 (×3): 1 g via INTRAVENOUS
  Filled 2011-10-27 (×3): qty 50

## 2011-10-27 MED ORDER — SODIUM CHLORIDE 0.9 % IJ SOLN: 3.0000 mL | INTRAMUSCULAR | Status: DC | PRN

## 2011-10-27 MED ORDER — SODIUM CHLORIDE 0.45 % IV SOLN
INTRAVENOUS | Status: DC
  Administered 2011-10-27: 09:00:00 via INTRAVENOUS

## 2011-10-27 MED ORDER — CHLORHEXIDINE GLUCONATE 4 % EX LIQD
60.0000 mL | Freq: Once | CUTANEOUS | Status: DC
  Filled 2011-10-27: qty 60

## 2011-10-27 MED ORDER — DIGOXIN 250 MCG PO TABS
250.0000 ug | ORAL_TABLET | Freq: Every day | ORAL | Status: DC
  Administered 2011-10-27 – 2011-10-28 (×2): 250 ug via ORAL
  Filled 2011-10-27 (×2): qty 1

## 2011-10-27 MED ORDER — LIDOCAINE HCL (PF) 1 % IJ SOLN
INTRAMUSCULAR | Status: AC
  Filled 2011-10-27: qty 60

## 2011-10-27 MED ORDER — SODIUM CHLORIDE 0.9 % IJ SOLN: 3.0000 mL | Freq: Two times a day (BID) | INTRAMUSCULAR | Status: DC

## 2011-10-27 MED ORDER — LIDOCAINE HCL (PF) 1 % IJ SOLN
INTRAMUSCULAR | Status: AC
  Filled 2011-10-27: qty 30

## 2011-10-27 MED ORDER — MUPIROCIN 2 % EX OINT
TOPICAL_OINTMENT | Freq: Once | CUTANEOUS | Status: AC
  Administered 2011-10-27: 1 via NASAL

## 2011-10-27 MED ORDER — LISINOPRIL 5 MG PO TABS
5.0000 mg | ORAL_TABLET | Freq: Every day | ORAL | Status: DC
  Administered 2011-10-27 – 2011-10-28 (×2): 5 mg via ORAL
  Filled 2011-10-27 (×2): qty 1

## 2011-10-27 MED ORDER — SODIUM CHLORIDE 0.9 % IV SOLN: INTRAVENOUS | Status: AC

## 2011-10-27 NOTE — CV Procedure (Signed)
Preop Dx  NICM CHF with 1AVB Post op Dx same  ICD implant with DFT testing  Dictation numbetr [phone]

## 2011-10-27 NOTE — Interval H&P Note (Signed)
History and Physical Interval Note:  10/27/2011 10:26 AM  William Davila  has presented today for surgery, with the diagnosis of Heart failure  The various methods of treatment have been discussed with the patient and family. After consideration of risks, benefits and other options for treatment, the patient has consented to  Procedure(s) (LRB): IMPLANTABLE CARDIOVERTER DEFIBRILLATOR IMPLANT (N/A) as a surgical intervention .  The patients' history has been reviewed, patient examined, no change in status, stable for surgery.  I have reviewed the patients' chart and labs.  Questions were answered to the patient's satisfaction.     Sherryl Manges  Questions answered no changes  For single chamber ICD implant today

## 2011-10-27 NOTE — H&P (View-Only) (Signed)
 History and Physical  Patient ID: William Davila MRN: [id number], SOB: 06/29/1965 46 y.o. Date of Encounter: 10/06/2011, 4:33 PM  Primary Physician: [name], [name], MD, MD Primary Cardiologist: DB Primary Electrophysiologist:  SK  Chief Complaint: ICD  History of Present Illness: William Davila is a 46 y.o. male seen at the request of Dr. DB for consideration of an ICD.  He has nonischemic cardiomyopathy diagnosed about 10 or 12 years ago in the setting of morbid obesity hypertension and dyslipidemia. He has a history of nonsustained ventricular tachycardia and was given a LifeVest . Echo.09/03/11: EF 20-25%, diffuse hypokinesis. Grade 2 diastolic dysfunction. Mildly dilated RV, RA. LA moderately dilated. Mild MR  He denies significant exercise breath but he is largely limited by his knees. He has not had syncope or palpitations. He denies orthopnea. He apparently is following his low salt diet is compliant with medications  His knee issue apparently will require surgery. He is listed as having gout, it sounds like is also some chondromalacia and arthritis I.     Past Medical History  Diagnosis Date  . Chronic systolic heart failure     Diagnosed 2001; prior patient of Dr. H. Smith with [facility] - discharged from practice  . Dilated cardiomyopathy     LVEF 10-20%  . Hyperlipidemia   . Essential hypertension, benign   . Morbid obesity   . Gout   . CHF (congestive heart failure)   . Cardiac arrhythmia   . Defibrillator activation      Past Surgical History  Procedure Date  . No past surgeries       Current Outpatient Prescriptions  Medication Sig Dispense Refill  . carvedilol (COREG) 6.25 MG tablet Take 1 tablet (6.25 mg total) by mouth 2 (two) times daily with a meal.  60 tablet  6  . digoxin (LANOXIN) 0.25 MG tablet Take 1 tablet (250 mcg total) by mouth daily.  30 tablet  6  . guaiFENesin (MUCINEX) 600 MG 12 hr tablet Take 600 mg by mouth daily as needed.         . lisinopril (PRINIVIL,ZESTRIL) 5 MG tablet Take 1 tablet (5 mg total) by mouth daily.  30 tablet  6  . torsemide (DEMADEX) 20 MG tablet Take 1 tablet (20 mg total) by mouth daily as needed. When weight goes up  30 tablet  6  . DISCONTD: torsemide (DEMADEX) 20 MG tablet Take 3 tablets (60 mg total) by mouth daily.  90 tablet  6     Allergies: Allergies  Allergen Reactions  . Beta Adrenergic Blockers Other (See Comments)    REACTION: fatigue and increased dyspnea with beta blockers.  . Bidil Other (See Comments)    REACTION: headache     History  Substance Use Topics  . Smoking status: Never Smoker   . Smokeless tobacco: Never Used  . Alcohol Use: No      Family History  Problem Relation Age of Onset  . Hypertension Mother       ROS:  Please see the history of present illness. Inviewed and negative.   Vital Signs: Blood pressure 121/81, pulse 74, height 5' 10" (1.778 m), weight 290 lb 12.8 oz (131.906 kg).  PHYSICAL EXAM: General:  Well nourished, well developed male in no acute distress and HEENT: normal Lymph: no adenopathy Neck:  JVD  7-8 cm  Endocrine:  No thryomegaly Vascular: No carotid bruits; FA pulses 2+ bilaterally without bruits Cardiac:   regular rate and rhythm with   a diminished S1 normal S2 2/6 systolic murmur along the left sternal border Back: without kyphosis/scoliosis, no CVA tenderness Lungs:  clear to auscultation bilaterally, no wheezing, rhonchi or rales Abd: soft, nontender, no hepatomegaly Ext: no edema Musculoskeletal:  No deformities, BUE and BLE strength normal and equal; some knee swelling  Skin: warm and dry Neuro:  CNs 2-12 intact, no focal abnormalities noted; walks with crutches  Psych:  Normal affect   EKG:   ECG reviewed from visit in February and showed to have a QRS duration of approximately 110 ms   Labs:   Lab Results  Component Value Date   WBC 11.3* 06/03/2011   HGB 16.8 06/03/2011   HCT 47.3 06/03/2011   MCV 79.1  06/03/2011   PLT 373 06/03/2011    Lab Results  Component Value Date   CHOL 100 05/01/2011   HDL 27* 05/01/2011   LDLCALC 62 05/01/2011   TRIG 54 05/01/2011   Lab Results  Component Value Date   DDIMER  Value: 0.84        AT THE INHOUSE ESTABLISHED CUTOFF VALUE OF 0.48 ug/mL FEU, THIS ASSAY HAS BEEN DOCUMENTED IN THE LITERATURE TO HAVE A SENSITIVITY AND NEGATIVE PREDICTIVE VALUE OF AT LEAST 98 TO 99%.  THE TEST RESULT SHOULD BE CORRELATED WITH AN ASSESSMENT OF THE CLINICAL PROBABILITY OF DVT / VTE.* 07/31/2010   BNP Pro B Natriuretic peptide (BNP)  Date/Time Value Range Status  05/01/2011  5:00 AM 3355.0* 0-125 (pg/mL) Final  07/10/2009  1:03 PM 83.0  (0.0-100.0 (pg/mL) Final  05/26/2007  1:15 PM 44.0   Final       ASSESSMENT AND PLAN:        

## 2011-10-27 NOTE — Op Note (Signed)
William Davila, William Davila                ACCOUNT NO.:  [id number]  MEDICAL RECORD NO.:  [id number]  LOCATION:  3702                         FACILITY:  [facility]  PHYSICIAN:  Duke Salvia, MD, FACCDATE OF BIRTH:  1966-01-17  DATE OF PROCEDURE:  10/27/2011 DATE OF DISCHARGE:                              OPERATIVE REPORT   PREOPERATIVE DIAGNOSES:  Nonischemic cardiomyopathy, congestive heart failure, first-degree AV block.  POSTOPERATIVE DIAGNOSIS:  Nonischemic cardiomyopathy, congestive heart failure, first-degree AV block.  PROCEDURE:  Single-chamber defibrillator implantation with intraoperative defibrillation threshold testing.  Following obtained informed consent, the patient was brought to the electrophysiology laboratory and placed on the fluoroscopic table in the supine position.  After routine prep and drape of the left upper chest, lidocaine was infiltrated in prepectoral subclavicular region.  Incision was made and carried down to layer of the prepectoral fascia.  Using electrocautery and sharp dissection, a pocket was formed similarly, hemostasis was obtained.  Thereafter, attention was turned to gain access to the extrathoracic left subclavian vein, which was accomplished without difficulty without the aspiration or puncture of the artery.  A venipuncture was accomplished.  Guidewires were placed and retained.  A 9-French sheath was placed, which was passed a St. Jude Durata 7121, 65 cm dual coil defibrillator lead, chosen because of greater interelectrode spacing, serial number was [id number].  Under fluoroscopic guidance, it was moved to the right ventricular apex where the bipolar R-wave was 8.1 mV with a pace impedance of 819.  A threshold of 0.5 V at 0.5 msec.  Current threshold was  0.4 mA.  There was no diaphragmatic pacing at 10 V.  The current of injury was modest.  The lead was secured to the prepectoral fascia and then attached to Medtronic Protecta CRT-D pulse  generator, model D314TRG, serial number [id number].  This device was chosen because of significant first-degree AV block and the likelihood that CRT upgrade would be necessary over time.  The pocket was copiously irrigated with antibiotic-containing saline solution.  Hemostasis was assured.  The leads and pulse generator were placed in the pocket and secured to the prepectoral fascia.  The wound was then closed in 3 layers in normal fashion.  The wound was washed and dried.  Benzoin and Steri-Strip dressing was applied. At this point, I scrubbed out and defibrillation threshold testing was undertaken.  Ventricular fibrillation was then induced via the T-wave shock.  After total duration of 8 seconds, a 20 joule shock was delivered through measured impedance of 41 ohms, terminating ventricular fibrillation and restoring sinus rhythm.  The patient tolerated the procedure without apparent complication.     Duke Salvia, MD, [facility]    SCK/MEDQ  D:  10/27/2011  T:  10/27/2011  Job:  [id number]

## 2011-10-28 ENCOUNTER — Ambulatory Visit (HOSPITAL_COMMUNITY): Payer: Medicaid Other

## 2011-10-28 DIAGNOSIS — I5022 Chronic systolic (congestive) heart failure: Secondary | ICD-10-CM | POA: Diagnosis not present

## 2011-10-28 MED ORDER — ACETAMINOPHEN-CODEINE #3 300-30 MG PO TABS: 1.0000 | ORAL_TABLET | ORAL | Status: AC | PRN

## 2011-10-28 NOTE — Progress Notes (Signed)
   ELECTROPHYSIOLOGY ROUNDING NOTE    Patient Name: William Davila Date of Encounter: 10-28-2011    SUBJECTIVE:Patient feels well.  Status post ICD implantation 10-28-2011.  No chest pain or shortness of breath.  Some incisional soreness.   TELEMETRY: Reviewed telemetry pt in sinus rhythm with occasional PVC's Filed Vitals:   10/27/11 1800 10/27/11 2100 10/28/11 0030 10/28/11 0500  BP: 117/66 101/66 102/70 122/72  Pulse: 86 86 74 64  Temp:  98.6 F (37 C) 98.7 F (37.1 C) 98.7 F (37.1 C)  TempSrc:  Oral  Oral  Resp: 20 20  20   SpO2:  98%  97%    Intake/Output Summary (Last 24 hours) at 10/28/11 0804 Last data filed at 10/28/11 0020  Gross per 24 hour  Intake    100 ml  Output      0 ml  Net    100 ml   Radiology/Studies:  CXR not yet done  PHYSICAL EXAM Left chest without hematoma or ecchymosis Well developed and nourished in no acute distress HENT normal Neck supple with JVP-flat Clear Regular rate and rhythm, no murmurs or gallops Abd-soft with active BS No Clubbing cyanosis edema Skin-warm and dry A & Oriented  Grossly normal sensory and motor function   DEVICE INTERROGATION: Device interrogated by industry.  Lead values including impedence, sensing, threshold within normal values.    Wound care, arm mobility, shock plan reviewed with patient.  Patient requesting pain medication for home.   Follow up with chf clinic to initiate aldactone  O/w continue current meds

## 2011-10-28 NOTE — Discharge Instructions (Signed)
   Supplemental Discharge Instructions for  Pacemaker/Defibrillator Patients  Activity No heavy lifting or vigorous activity with your left/right arm for 6 to 8 weeks.  Do not raise your left/right arm above your head for one week.  Gradually raise your affected arm as drawn below.           05/18                      05/19                        05/20                      05/21        NO DRIVING for 1 week; you may begin driving on 11/04/2011. WOUND CARE   Keep the wound area clean and dry.  Do not get this area wet for one week. No showers for one week; you may shower on 11/04/2011.   The tape/steri-strips on your wound will fall off; do not pull them off.  No bandage is needed on the site.  DO  NOT apply any creams, oils, or ointments to the wound area.   If you notice any drainage or discharge from the wound, any swelling or bruising at the site, or you develop a fever > 101? F after you are discharged home, call the office at once.  Special Instructions   You are still able to use cellular telephones; use the ear opposite the side where you have your pacemaker/defibrillator.  Avoid carrying your cellular phone near your device.   When traveling through airports, show security personnel your identification card to avoid being screened in the metal detectors.  Ask the security personnel to use the hand wand.   Avoid arc welding equipment, MRI testing (magnetic resonance imaging), TENS units (transcutaneous nerve stimulators).  Call the office for questions about other devices.   Avoid electrical appliances that are in poor condition or are not properly grounded.   Microwave ovens are safe to be near or to operate.  Additional information for defibrillator patients should your device go off:   If your device goes off ONCE and you feel fine afterward, notify the device clinic nurses.   If your device goes off ONCE and you do not feel well afterward, call 911.   If your device goes off  TWICE, call 911.   If your device goes off THREE times in one day, call 911.  DO NOT DRIVE YOURSELF OR A FAMILY MEMBER WITH A DEFIBRILLATOR TO THE HOSPITAL--CALL 911. 

## 2011-11-03 ENCOUNTER — Ambulatory Visit (HOSPITAL_COMMUNITY)
Admission: RE | Admit: 2011-11-03 | Discharge: 2011-11-03 | Disposition: A | Discharge status: Home / Self Care | Payer: Medicaid Other | Source: Ambulatory Visit | Attending: Internal Medicine | Admitting: Internal Medicine

## 2011-11-03 VITALS — BP 122/68 | HR 80 | Wt 284.5 lb

## 2011-11-03 DIAGNOSIS — I5022 Chronic systolic (congestive) heart failure: Secondary | ICD-10-CM | POA: Diagnosis present

## 2011-11-03 MED ORDER — CARVEDILOL 6.25 MG PO TABS: ORAL_TABLET | ORAL | Status: DC

## 2011-11-03 NOTE — Assessment & Plan Note (Signed)
Patient seen and examined with Ulyess Blossom, PA-C. We discussed all aspects of the encounter. I agree with the assessment and plan as stated above. Doing extremely well NYHA II. Volume status looks great. He is s/p ICD implant last week. Will increase carvedilol to 9.375 bid. Reinforced need for daily weights and reviewed use of sliding scale diuretics.

## 2011-11-03 NOTE — Progress Notes (Signed)
HPI:  [name]  is a 46 year old AA male with systolic heart failure (EF10-20%) due to non-ischemic cardiolmyopathy diagnosed 2001, hyperlipidemia, HTN, and obesity.  11/29 Lisinopril decreased 20 mg daily by PCP due to hypotension.   12/18 admitted with renal failure (Cr up to 4) in the setting of viral illness with poor po intake and volume depletion. Hydrated and Cr improved back to baseline. While in hospital had 75 beat run of VT and LifeVest placed. Weight on d/c was 284.  Echo 09/03/11: EF 20-25%, diffuse hypokinesis.  Grade 2 diastolic dysfunction.  Mildly dilated RV, RA.  LA moderately dilated.  Mild MR.   He returns for post hospital follow up today.  Had Medtronic CRT-D placed on 10/27/11 by Dr. Graciela Husbands (only RV lead placed but felt there was increase likelihood of ned for CRT upgrade). He feels good today.   Weighing 274-278 pounds at home.  No edema.   No dizziness or syncope.  He is ambulating without crutches now.  Walking 2 blocks and up small hills without difficulty.    ROS: All systems negative except as listed in HPI, PMH and Problem List.  Past Medical History  Diagnosis Date  . Chronic systolic heart failure     Diagnosed 2001; prior patient of Dr. Mendel Ryder with [facility] - discharged from practice  . Dilated cardiomyopathy     LVEF 10-20%  . Hyperlipidemia   . Essential hypertension, benign   . Morbid obesity   . Gout   . CHF (congestive heart failure)   . Cardiac arrhythmia   . Defibrillator activation   . 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 Arthritis     knee's    Current Outpatient Prescriptions  Medication Sig Dispense Refill  . acetaminophen (TYLENOL) 500 MG tablet Take 1,000 mg by mouth every 6 (six) hours as needed. For pain      . acetaminophen-codeine (TYLENOL #3) 300-30 MG per tablet Take 1 tablet by mouth every 4 (four) hours as needed for pain.  15 tablet  0  . carvedilol (COREG) 6.25 MG tablet Take 6.25 mg by mouth 2 (two)  times daily with a meal.      . digoxin (LANOXIN) 0.25 MG tablet Take 250 mcg by mouth daily.      Marland Kitchen lisinopril (PRINIVIL,ZESTRIL) 5 MG tablet Take 5 mg by mouth daily.      Marland Kitchen torsemide (DEMADEX) 20 MG tablet Take 20 mg by mouth daily as needed. When weight goes up      . DISCONTD: torsemide (DEMADEX) 20 MG tablet Take 3 tablets (60 mg total) by mouth daily.  90 tablet  6     PHYSICAL EXAM: Filed Vitals:   11/03/11 1427  BP: 122/68  Pulse: 80  Weight: 284 lb 8 oz (129.048 kg)  SpO2: 100%   General:  Well appearing.  No resp difficulty HEENT: normal Neck: supple. JVP flat. Carotids 2+ bilaterally; no bruits. No lymphadenopathy or thryomegaly appreciated.  Pacer insertion site without erythema, heat or drainage.   Cor: PMI nonpalpable. Regular rate & rhythm. No rubs, gallops or murmurs.  Lungs: clear Abdomen: soft,  Obese. nontender, nondistended. No hepatosplenomegaly. No bruits or masses. Good bowel sounds. Extremities: no cyanosis, clubbing, rash, edema. No effusion over knee.  Neuro: alert & orientedx3, cranial nerves grossly intact. Moves all 4 extremities w/o difficulty. Affect pleasant.   ASSESSMENT & PLAN:

## 2011-11-03 NOTE — Patient Instructions (Signed)
Increase carvedilol to 9.375 mg (1.5 tabs) twice daily.   Follow up 1 month.  Do the following things EVERYDAY: 1) Weigh yourself in the morning before breakfast. Write it down and keep it in a log. 2) Take your medicines as prescribed 3) Eat low salt foods--Limit salt (sodium) to 2000 mg per day.  4) Stay as active as you can everyday 5) Limit all fluids for the day to less than 2 liters

## 2011-11-10 ENCOUNTER — Encounter: Payer: Self-pay | Admitting: Internal Medicine

## 2011-11-10 ENCOUNTER — Ambulatory Visit (INDEPENDENT_AMBULATORY_CARE_PROVIDER_SITE_OTHER): Payer: Self-pay | Admitting: *Deleted

## 2011-11-10 DIAGNOSIS — I472 Ventricular tachycardia: Secondary | ICD-10-CM

## 2011-11-10 DIAGNOSIS — I428 Other cardiomyopathies: Secondary | ICD-10-CM

## 2011-11-10 DIAGNOSIS — I5022 Chronic systolic (congestive) heart failure: Secondary | ICD-10-CM

## 2011-11-10 LAB — ICD DEVICE OBSERVATION
AL IMPEDENCE ICD: 4047 Ohm
BATTERY VOLTAGE: 3.2105 V
LV LEAD IMPEDENCE ICD: 4047 Ohm
RV LEAD AMPLITUDE: 10.75 mv
RV LEAD THRESHOLD: 0.75 V
TOT-0002: 0
TOT-0006: 20130515000000
TZAT-0001ATACH: 1
TZAT-0001ATACH: 3
TZAT-0001FASTVT: 1
TZAT-0004SLOWVT: 8
TZAT-0004SLOWVT: 8
TZAT-0005SLOWVT: 91 pct
TZAT-0011SLOWVT: 10 ms
TZAT-0011SLOWVT: 10 ms
TZAT-0012ATACH: 150 ms
TZAT-0012ATACH: 150 ms
TZAT-0012SLOWVT: 170 ms
TZAT-0012SLOWVT: 170 ms
TZAT-0013SLOWVT: 2
TZAT-0013SLOWVT: 2
TZAT-0018ATACH: NEGATIVE
TZAT-0018ATACH: NEGATIVE
TZAT-0020ATACH: 1.5 ms
TZAT-0020ATACH: 1.5 ms
TZAT-0020SLOWVT: 1.5 ms
TZAT-0020SLOWVT: 1.5 ms
TZON-0003ATACH: 350 ms
TZON-0003SLOWVT: 300 ms
TZON-0004SLOWVT: 28
TZST-0001ATACH: 4
TZST-0001ATACH: 6
TZST-0001FASTVT: 2
TZST-0001FASTVT: 4
TZST-0001FASTVT: 6
TZST-0001SLOWVT: 4
TZST-0001SLOWVT: 6
TZST-0002ATACH: NEGATIVE
TZST-0002ATACH: NEGATIVE
TZST-0002FASTVT: NEGATIVE
TZST-0002FASTVT: NEGATIVE
TZST-0003SLOWVT: 35 J
TZST-0003SLOWVT: 35 J

## 2011-11-10 NOTE — Progress Notes (Signed)
Wound check-ICD 

## 2011-11-15 ENCOUNTER — Encounter: Payer: Self-pay | Admitting: Internal Medicine

## 2011-11-15 ENCOUNTER — Ambulatory Visit (INDEPENDENT_AMBULATORY_CARE_PROVIDER_SITE_OTHER): Payer: Self-pay | Admitting: Internal Medicine

## 2011-11-15 VITALS — BP 105/72 | HR 74 | Temp 97.3°F | Resp 20 | Ht 69.0 in | Wt 285.9 lb

## 2011-11-15 DIAGNOSIS — M25561 Pain in right knee: Secondary | ICD-10-CM

## 2011-11-15 DIAGNOSIS — E785 Hyperlipidemia, unspecified: Secondary | ICD-10-CM

## 2011-11-15 DIAGNOSIS — M25569 Pain in unspecified knee: Secondary | ICD-10-CM

## 2011-11-15 DIAGNOSIS — I10 Essential (primary) hypertension: Secondary | ICD-10-CM

## 2011-11-15 DIAGNOSIS — IMO0002 Reserved for concepts with insufficient information to code with codable children: Secondary | ICD-10-CM

## 2011-11-15 DIAGNOSIS — I428 Other cardiomyopathies: Secondary | ICD-10-CM

## 2011-11-15 DIAGNOSIS — Z23 Encounter for immunization: Secondary | ICD-10-CM

## 2011-11-15 NOTE — Progress Notes (Signed)
Subjective:     Patient ID: GLADYS GUTMAN, male   DOB: August 13, 1965, 46 y.o.   MRN: [id number]  HPI Patient is a 46 year old man with history of dilated cardiomyopathy s/p ICD placement on 10/28/11, obesity, and chronic bilateral knee pain who presents for followup.  Cadriomyopathy: S/p ICD placement with Dr. Graciela Husbands on 10/28/11.  Tolerated procedure well, no pain.  Saw CHF clinic for f/u, meds were titrated.  Con't to take meds on a sliding scale based on daily weights.  Knees: saw sports medicine in April, got R knee corticosteroid injection.  Did not f/u as asked, but was undergoing pacemaker implantation during that time period.  Still has intermittent R knee pain and buckling, using a single crutch for support for the last week when he is outside the house.  Also has some intermittent R hip pain that does not bother him on a daily basis.  Review of Systems No CP, SOB, palpitations    Objective:   Physical Exam GEN: NAD.  Alert and oriented x 3.  Pleasant, conversant, and cooperative to exam. RESP:  CTAB, no w/r/r CARDIOVASCULAR: RRR, S1, S2, no m/r/g EXT: warm and dry. No edema in b/l LE     Assessment:         Plan:

## 2011-11-15 NOTE — Assessment & Plan Note (Signed)
Lipid Panel     Component Value Date/Time   CHOL 100 05/01/2011 0600   TRIG 54 05/01/2011 0600   HDL 27* 05/01/2011 0600   CHOLHDL 3.7 05/01/2011 0600   VLDL 11 05/01/2011 0600   LDLCALC 62 05/01/2011 0600   Last lipid panel showed good control, no meds.  Recheck at one year.

## 2011-11-15 NOTE — Assessment & Plan Note (Addendum)
Seen by Dr. 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 Patient received a corticosteroid injection of the right knee with some relief, but now has slightly worsening pain and instability in the right knee. I encouraged the patient to followup with sports medicine. He does not seem to be in PT at the moment.  He raised the question of a resurfacing procedure, which may have been mentioned to him at sports medicine. He does have some very intermittent hip pain, it may be related to his favoring one leg over the other. It does not affect his daily life, and I think pursuing his knee pain remains his primary problem. - Followup with sports medicine - ? PT - Continue to remain as active as possible

## 2011-11-15 NOTE — Assessment & Plan Note (Signed)
Con't CHF meds

## 2011-11-15 NOTE — Assessment & Plan Note (Signed)
S/p ICD on 10/28/11 with Dr. Graciela Husbands.  Tolerated procedure well.  Con't to f/u with CHF clinic for med titration.  No SOB or palpitations, doing well.  No fluid on exam. - con't to f/u with CHF clinic and cardiology

## 2011-12-06 ENCOUNTER — Encounter (HOSPITAL_COMMUNITY): Payer: Self-pay

## 2011-12-06 ENCOUNTER — Ambulatory Visit (HOSPITAL_COMMUNITY)
Admission: RE | Admit: 2011-12-06 | Discharge: 2011-12-06 | Disposition: A | Discharge status: Home / Self Care | Payer: Self-pay | Source: Ambulatory Visit | Attending: Internal Medicine | Admitting: Internal Medicine

## 2011-12-06 VITALS — BP 104/58 | HR 69 | Resp 17 | Ht 70.0 in | Wt 285.8 lb

## 2011-12-06 DIAGNOSIS — M25569 Pain in unspecified knee: Secondary | ICD-10-CM | POA: Insufficient documentation

## 2011-12-06 DIAGNOSIS — E785 Hyperlipidemia, unspecified: Secondary | ICD-10-CM | POA: Insufficient documentation

## 2011-12-06 DIAGNOSIS — I059 Rheumatic mitral valve disease, unspecified: Secondary | ICD-10-CM | POA: Insufficient documentation

## 2011-12-06 DIAGNOSIS — I428 Other cardiomyopathies: Secondary | ICD-10-CM | POA: Insufficient documentation

## 2011-12-06 DIAGNOSIS — I509 Heart failure, unspecified: Secondary | ICD-10-CM | POA: Insufficient documentation

## 2011-12-06 DIAGNOSIS — Z95810 Presence of automatic (implantable) cardiac defibrillator: Secondary | ICD-10-CM | POA: Insufficient documentation

## 2011-12-06 DIAGNOSIS — M109 Gout, unspecified: Secondary | ICD-10-CM | POA: Insufficient documentation

## 2011-12-06 DIAGNOSIS — I10 Essential (primary) hypertension: Secondary | ICD-10-CM | POA: Insufficient documentation

## 2011-12-06 DIAGNOSIS — M25562 Pain in left knee: Secondary | ICD-10-CM

## 2011-12-06 DIAGNOSIS — I498 Other specified cardiac arrhythmias: Secondary | ICD-10-CM | POA: Insufficient documentation

## 2011-12-06 DIAGNOSIS — M1710 Unilateral primary osteoarthritis, unspecified knee: Secondary | ICD-10-CM | POA: Insufficient documentation

## 2011-12-06 DIAGNOSIS — I5022 Chronic systolic (congestive) heart failure: Secondary | ICD-10-CM | POA: Insufficient documentation

## 2011-12-06 NOTE — Assessment & Plan Note (Addendum)
I feel he is stable froma  Cardiac point of view  for laproscopic knee surgery as needed. Would avoid general anesthesia and open surgery at this time, if possible.

## 2011-12-06 NOTE — Patient Instructions (Addendum)
Your physician recommends that you schedule a follow-up appointment in: 2 months  

## 2011-12-06 NOTE — Assessment & Plan Note (Signed)
Doing well. NYHA II-III. Weight stable. Increase carvedilol to 12.5 bid. Reinforced need for daily weights and reviewed use of sliding scale diuretics.

## 2011-12-06 NOTE — Addendum Note (Signed)
Encounter addended by: Noralee Space, RN on: 12/06/2011  3:21 PM<BR>     Documentation filed: Patient Instructions Section

## 2011-12-06 NOTE — Progress Notes (Signed)
HPI:  Mr Blanford  is a 46 year old AA male with systolic heart failure (EF10-20%) due to non-ischemic cardiolmyopathy diagnosed 2001, hyperlipidemia, HTN, and obesity.  11/29 Lisinopril decreased 20 mg daily by PCP due to hypotension.   12/18 admitted with renal failure (Cr up to 4) in the setting of viral illness with poor po intake and volume depletion. Hydrated and Cr improved back to baseline. While in hospital had 75 beat run of VT and LifeVest placed. Weight on d/c was 284.  Echo 09/03/11: EF 20-25%, diffuse hypokinesis.  Grade 2 diastolic dysfunction.  Mildly dilated RV, RA.  LA moderately dilated.  Mild MR.   He returns for post hospital follow up today.  Had [employer] CRT-D placed on 10/27/11 by Dr. Graciela Husbands (only RV lead placed but felt there was increase likelihood of need for CRT upgrade down the road).   He continues to feel well.  Weighs every day  280-285 pounds at home.  No edema. Continues with L knee pain. Says ortho wants to scrape out his knee.   No dizziness or syncope. Walking limited due to knee pain. No ICD shocks. Compliant with meds.    ROS: All systems negative except as listed in HPI, PMH and Problem List.  Past Medical History  Diagnosis Date  . Chronic systolic heart failure     Diagnosed 2001; prior patient of Dr. Mendel Ryder with [facility] - discharged from practice  . Dilated cardiomyopathy     LVEF 10-20%  . Hyperlipidemia   . Essential hypertension, benign   . Morbid obesity   . Gout   . CHF (congestive heart failure)   . Cardiac arrhythmia   . Defibrillator activation   . 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 Arthritis     knee's    Current Outpatient Prescriptions  Medication Sig Dispense Refill  . acetaminophen (TYLENOL) 500 MG tablet Take 1,000 mg by mouth every 6 (six) hours as needed. For pain      . carvedilol (COREG) 6.25 MG tablet 1.5 tabs twice daily.      . digoxin (LANOXIN) 0.25 MG tablet Take 250 mcg by mouth daily.        Marland Kitchen lisinopril (PRINIVIL,ZESTRIL) 5 MG tablet Take 5 mg by mouth daily.      Marland Kitchen torsemide (DEMADEX) 20 MG tablet Take 20 mg by mouth daily as needed. When weight goes up      . DISCONTD: torsemide (DEMADEX) 20 MG tablet Take 3 tablets (60 mg total) by mouth daily.  90 tablet  6     PHYSICAL EXAM: Filed Vitals:   12/06/11 1445  BP: 104/58  Pulse: 69  Resp: 17  Height: 5\' 10"  (1.778 m)  Weight: 285 lb 12.8 oz (129.638 kg)  SpO2: 99%   General:  Well appearing.  No resp difficulty HEENT: normal Neck: supple. JVP flat. Carotids 2+ bilaterally; no bruits. No lymphadenopathy or thryomegaly appreciated.  ICD site well healed  Cor: PMI nonpalpable. Regular rate & rhythm. No rubs, gallops or murmurs.  Lungs: clear Abdomen: soft,  Obese. nontender, nondistended. No hepatosplenomegaly. No bruits or masses. Good bowel sounds. Extremities: no cyanosis, clubbing, rash, edema. No effusion over knee.  Neuro: alert & orientedx3, cranial nerves grossly intact. Moves all 4 extremities w/o difficulty. Affect pleasant.   ASSESSMENT & PLAN:

## 2011-12-30 ENCOUNTER — Telehealth (HOSPITAL_COMMUNITY): Payer: Self-pay | Admitting: Vascular Surgery

## 2011-12-30 NOTE — Telephone Encounter (Signed)
Spoke w/pt he is requesting records be faxed to his home, he would like last few ov notes and device implant note, have printed and mailed to pt

## 2011-12-30 NOTE — Telephone Encounter (Signed)
[name] wants his medical records for a medicaid appt. Please call him thanks

## 2011-12-30 NOTE — Telephone Encounter (Signed)
Left message to call back  

## 2012-01-27 ENCOUNTER — Encounter: Payer: Self-pay | Admitting: *Deleted

## 2012-01-27 DIAGNOSIS — Z4502 Encounter for adjustment and management of automatic implantable cardiac defibrillator: Secondary | ICD-10-CM | POA: Insufficient documentation

## 2012-02-03 ENCOUNTER — Encounter: Payer: Self-pay | Admitting: Internal Medicine

## 2012-02-03 ENCOUNTER — Ambulatory Visit (INDEPENDENT_AMBULATORY_CARE_PROVIDER_SITE_OTHER): Payer: Self-pay | Admitting: Internal Medicine

## 2012-02-03 VITALS — BP 104/72 | HR 82 | Ht 70.0 in | Wt 293.1 lb

## 2012-02-03 DIAGNOSIS — I5022 Chronic systolic (congestive) heart failure: Secondary | ICD-10-CM

## 2012-02-03 DIAGNOSIS — Z95810 Presence of automatic (implantable) cardiac defibrillator: Secondary | ICD-10-CM

## 2012-02-03 DIAGNOSIS — I472 Ventricular tachycardia: Secondary | ICD-10-CM

## 2012-02-03 DIAGNOSIS — I428 Other cardiomyopathies: Secondary | ICD-10-CM

## 2012-02-03 LAB — ICD DEVICE OBSERVATION
AL IMPEDENCE ICD: 4047 Ohm
ATRIAL PACING ICD: 0 pct
CHARGE TIME: 3.553 s
FVT: 0
PACEART VT: 0
RV LEAD THRESHOLD: 0.875 V
TOT-0001: 1
TOT-0002: 0
TZAT-0001ATACH: 2
TZAT-0001ATACH: 3
TZAT-0001FASTVT: 1
TZAT-0001SLOWVT: 2
TZAT-0002ATACH: NEGATIVE
TZAT-0004SLOWVT: 8
TZAT-0004SLOWVT: 8
TZAT-0005SLOWVT: 88 pct
TZAT-0005SLOWVT: 91 pct
TZAT-0012ATACH: 150 ms
TZAT-0012ATACH: 150 ms
TZAT-0012ATACH: 150 ms
TZAT-0012FASTVT: 170 ms
TZAT-0012SLOWVT: 170 ms
TZAT-0012SLOWVT: 170 ms
TZAT-0018ATACH: NEGATIVE
TZAT-0018FASTVT: NEGATIVE
TZAT-0019FASTVT: 8 V
TZAT-0020ATACH: 1.5 ms
TZAT-0020FASTVT: 1.5 ms
TZAT-0020SLOWVT: 1.5 ms
TZAT-0020SLOWVT: 1.5 ms
TZON-0003ATACH: 350 ms
TZON-0003SLOWVT: 300 ms
TZON-0003VSLOWVT: 350 ms
TZON-0004SLOWVT: 28
TZON-0004VSLOWVT: 32
TZST-0001ATACH: 4
TZST-0001ATACH: 6
TZST-0001FASTVT: 2
TZST-0001FASTVT: 4
TZST-0001FASTVT: 6
TZST-0001SLOWVT: 3
TZST-0001SLOWVT: 5
TZST-0002FASTVT: NEGATIVE
TZST-0002FASTVT: NEGATIVE
TZST-0002FASTVT: NEGATIVE
TZST-0003SLOWVT: 30 J
TZST-0003SLOWVT: 35 J
VENTRICULAR PACING ICD: 0.15 pct

## 2012-02-03 NOTE — Assessment & Plan Note (Signed)
Encouraged him to pursue a program of regular 15-30 minutes a day regular exercise and hopeful weight loss

## 2012-02-03 NOTE — Progress Notes (Signed)
  HPI  William Davila is a 46 y.o. male seen  in followup for ICD-D implanted for  nonischemic cardiomyopathy diagnosed about 10 or 12 years ago in the setting of morbid obesity hypertension and dyslipidemia. Date implant was May 2013    He has a history of nonsustained ventricular tachycardia   . Echo.09/03/11: EF 20-25%, diffuse hypokinesis. Grade 2 diastolic dysfunction. Mildly dilated RV, RA. LA moderately dilated. Mild MR   The patient denies chest pain, he has stable shortness of breath but no cturnal dyspnea, orthopnea or peripheral edema.  There have been no palpitations, lightheadedness or syncope.  He is starting to walk  Past Medical History  Diagnosis Date  . Chronic systolic heart failure     Diagnosed 2001; prior patient of Dr. Mendel Ryder with [facility] - discharged from practice  . Dilated cardiomyopathy     LVEF 10-20%  . Hyperlipidemia   . Essential hypertension, benign   . Morbid obesity   . Gout   . CHF (congestive heart failure)   . Cardiac arrhythmia   . Defibrillator activation   . 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 Arthritis     knee's    Past Surgical History  Procedure Date  . Icd 10/27/2011    Current Outpatient Prescriptions  Medication Sig Dispense Refill  . acetaminophen (TYLENOL) 500 MG tablet Take 1,000 mg by mouth every 6 (six) hours as needed. For pain      . carvedilol (COREG) 6.25 MG tablet 1.5 tabs twice daily.      . digoxin (LANOXIN) 0.25 MG tablet Take 250 mcg by mouth daily.      Marland Kitchen lisinopril (PRINIVIL,ZESTRIL) 5 MG tablet Take 5 mg by mouth daily.      Marland Kitchen torsemide (DEMADEX) 20 MG tablet Take 20 mg by mouth daily as needed. When weight goes up      . DISCONTD: torsemide (DEMADEX) 20 MG tablet Take 3 tablets (60 mg total) by mouth daily.  90 tablet  6    Allergies  Allergen Reactions  . Beta Adrenergic Blockers Other (See Comments)    REACTION: fatigue and increased dyspnea with beta blockers.  . Isosorb  Dinitrate-Hydralazine Other (See Comments)    REACTION: headache    Review of Systems negative except from HPI and PMH  Physical Exam BP 104/72  Pulse 82  Ht 5\' 10"  (1.778 m)  Wt 293 lb 1.9 oz (132.958 kg)  BMI 42.06 kg/m2 Well developed and well nourished in no acute distress HENT normal E scleral and icterus clear Neck Supple JVP flat; carotids brisk and full Clear to ausculation Device pocket well healed; without hematoma or erythema  Regular rate and rhythm, no murmurs gallops or rub Soft with active bowel sounds No clubbing cyanosis none Edema Alert and oriented, grossly normal motor and sensory function Skin Warm and Dry  The echocardiogram demonstrates sinus rhythm at 8245/13/37 Axis XXXIX E nonspecific IVCD  Assessment and  Plan

## 2012-02-03 NOTE — Assessment & Plan Note (Signed)
The patient's device was interrogated.  The information was reviewed. No changes were made in the programming.    

## 2012-02-03 NOTE — Patient Instructions (Signed)
Your physician recommends that you schedule a follow-up appointment in: 3 months with Kristin/ Gunnar Fusi for a device check.  Your physician wants you to follow-up in: 9 months with Dr. Graciela Husbands. You will receive a reminder letter in the mail two months in advance. If you don't receive a letter, please call our office to schedule the follow-up appointment.  Your physician recommends that you continue on your current medications as directed. Please refer to the Current Medication list given to you today.

## 2012-02-03 NOTE — Assessment & Plan Note (Signed)
Nonsustained ventricular tachycardia but without therapy

## 2012-02-03 NOTE — Assessment & Plan Note (Signed)
Euvolemic in stable

## 2012-03-07 ENCOUNTER — Other Ambulatory Visit (HOSPITAL_COMMUNITY): Payer: Self-pay | Admitting: Adult Health

## 2012-03-07 ENCOUNTER — Other Ambulatory Visit (HOSPITAL_COMMUNITY): Payer: Self-pay | Admitting: Internal Medicine

## 2012-03-31 ENCOUNTER — Telehealth (HOSPITAL_COMMUNITY): Payer: Self-pay | Admitting: *Deleted

## 2012-03-31 NOTE — Telephone Encounter (Signed)
Received request from [employer], along with pt's signed consent requesting pt's records.  Records were faxed to them at [phone]

## 2012-04-07 ENCOUNTER — Other Ambulatory Visit (HOSPITAL_COMMUNITY): Payer: Self-pay | Admitting: Internal Medicine

## 2012-04-20 ENCOUNTER — Other Ambulatory Visit (HOSPITAL_COMMUNITY): Payer: Self-pay | Admitting: Adult Health

## 2012-05-08 ENCOUNTER — Ambulatory Visit (INDEPENDENT_AMBULATORY_CARE_PROVIDER_SITE_OTHER): Payer: Self-pay | Admitting: *Deleted

## 2012-05-08 ENCOUNTER — Encounter: Payer: Self-pay | Admitting: Internal Medicine

## 2012-05-08 DIAGNOSIS — I428 Other cardiomyopathies: Secondary | ICD-10-CM

## 2012-05-08 DIAGNOSIS — I5022 Chronic systolic (congestive) heart failure: Secondary | ICD-10-CM

## 2012-05-08 LAB — ICD DEVICE OBSERVATION
BATTERY VOLTAGE: 3.1832 V
BRDY-0002RV: 40 {beats}/min
CHARGE TIME: 8.728 s
FVT: 0
PACEART VT: 0
RV LEAD AMPLITUDE: 12.375 mv
RV LEAD THRESHOLD: 0.75 V
TOT-0001: 1
TZAT-0001ATACH: 3
TZAT-0001FASTVT: 1
TZAT-0002ATACH: NEGATIVE
TZAT-0002FASTVT: NEGATIVE
TZAT-0012ATACH: 150 ms
TZAT-0012ATACH: 150 ms
TZAT-0012FASTVT: 170 ms
TZAT-0012SLOWVT: 170 ms
TZAT-0012SLOWVT: 170 ms
TZAT-0018ATACH: NEGATIVE
TZAT-0018SLOWVT: NEGATIVE
TZAT-0018SLOWVT: NEGATIVE
TZAT-0019ATACH: 6 V
TZAT-0019ATACH: 6 V
TZAT-0019SLOWVT: 8 V
TZAT-0019SLOWVT: 8 V
TZAT-0020ATACH: 1.5 ms
TZAT-0020ATACH: 1.5 ms
TZAT-0020ATACH: 1.5 ms
TZAT-0020SLOWVT: 1.5 ms
TZAT-0020SLOWVT: 1.5 ms
TZON-0003SLOWVT: 300 ms
TZON-0003VSLOWVT: 350 ms
TZST-0001ATACH: 5
TZST-0001ATACH: 6
TZST-0001FASTVT: 4
TZST-0001FASTVT: 5
TZST-0001SLOWVT: 5
TZST-0002ATACH: NEGATIVE
TZST-0002FASTVT: NEGATIVE
TZST-0002FASTVT: NEGATIVE
TZST-0003SLOWVT: 30 J
TZST-0003SLOWVT: 35 J
TZST-0003SLOWVT: 35 J

## 2012-05-08 NOTE — Progress Notes (Signed)
ICD check with ICM 

## 2012-05-08 NOTE — Patient Instructions (Addendum)
Return office visit 07/31/12 @ 10:30am with the device clinic.

## 2012-07-31 ENCOUNTER — Encounter: Payer: Self-pay | Admitting: Internal Medicine

## 2012-07-31 ENCOUNTER — Ambulatory Visit (INDEPENDENT_AMBULATORY_CARE_PROVIDER_SITE_OTHER): Payer: No Typology Code available for payment source | Admitting: *Deleted

## 2012-07-31 ENCOUNTER — Other Ambulatory Visit: Payer: Self-pay

## 2012-07-31 DIAGNOSIS — I5022 Chronic systolic (congestive) heart failure: Secondary | ICD-10-CM

## 2012-07-31 DIAGNOSIS — I428 Other cardiomyopathies: Secondary | ICD-10-CM

## 2012-07-31 LAB — ICD DEVICE OBSERVATION
AL IMPEDENCE ICD: 4047 Ohm
ATRIAL PACING ICD: 0 pct
BATTERY VOLTAGE: 3.1875 V
BRDY-0002RV: 40 {beats}/min
LV LEAD IMPEDENCE ICD: 4047 Ohm
RV LEAD AMPLITUDE: 9.9 mv
RV LEAD IMPEDENCE ICD: 437 Ohm
RV LEAD THRESHOLD: 1 V
TOT-0001: 1
TZAT-0001ATACH: 3
TZAT-0001FASTVT: 1
TZAT-0002ATACH: NEGATIVE
TZAT-0002FASTVT: NEGATIVE
TZAT-0011SLOWVT: 10 ms
TZAT-0011SLOWVT: 10 ms
TZAT-0012ATACH: 150 ms
TZAT-0012FASTVT: 170 ms
TZAT-0012SLOWVT: 170 ms
TZAT-0012SLOWVT: 170 ms
TZAT-0018ATACH: NEGATIVE
TZAT-0018SLOWVT: NEGATIVE
TZAT-0018SLOWVT: NEGATIVE
TZAT-0019ATACH: 6 V
TZAT-0019ATACH: 6 V
TZAT-0019SLOWVT: 8 V
TZAT-0019SLOWVT: 8 V
TZAT-0020ATACH: 1.5 ms
TZAT-0020ATACH: 1.5 ms
TZAT-0020ATACH: 1.5 ms
TZAT-0020SLOWVT: 1.5 ms
TZAT-0020SLOWVT: 1.5 ms
TZON-0003VSLOWVT: 350 ms
TZON-0005SLOWVT: 12
TZST-0001ATACH: 5
TZST-0001ATACH: 6
TZST-0001FASTVT: 3
TZST-0001FASTVT: 4
TZST-0001FASTVT: 5
TZST-0001SLOWVT: 4
TZST-0001SLOWVT: 5
TZST-0002ATACH: NEGATIVE
TZST-0002FASTVT: NEGATIVE
TZST-0002FASTVT: NEGATIVE
TZST-0003SLOWVT: 30 J
TZST-0003SLOWVT: 35 J
VENTRICULAR PACING ICD: 0 pct

## 2012-07-31 NOTE — Progress Notes (Signed)
defib check in clinic  

## 2012-08-17 ENCOUNTER — Ambulatory Visit: Payer: Self-pay

## 2012-09-05 ENCOUNTER — Other Ambulatory Visit (HOSPITAL_COMMUNITY): Payer: Self-pay | Admitting: Internal Medicine

## 2012-09-16 IMAGING — CR DG CHEST 2V
2 series · 2 of 2 positions shown · non-contrast
Comparison: 05/01/2011

CLINICAL DATA: Hypotension.

CHEST - 2 VIEW

[w chest lat]
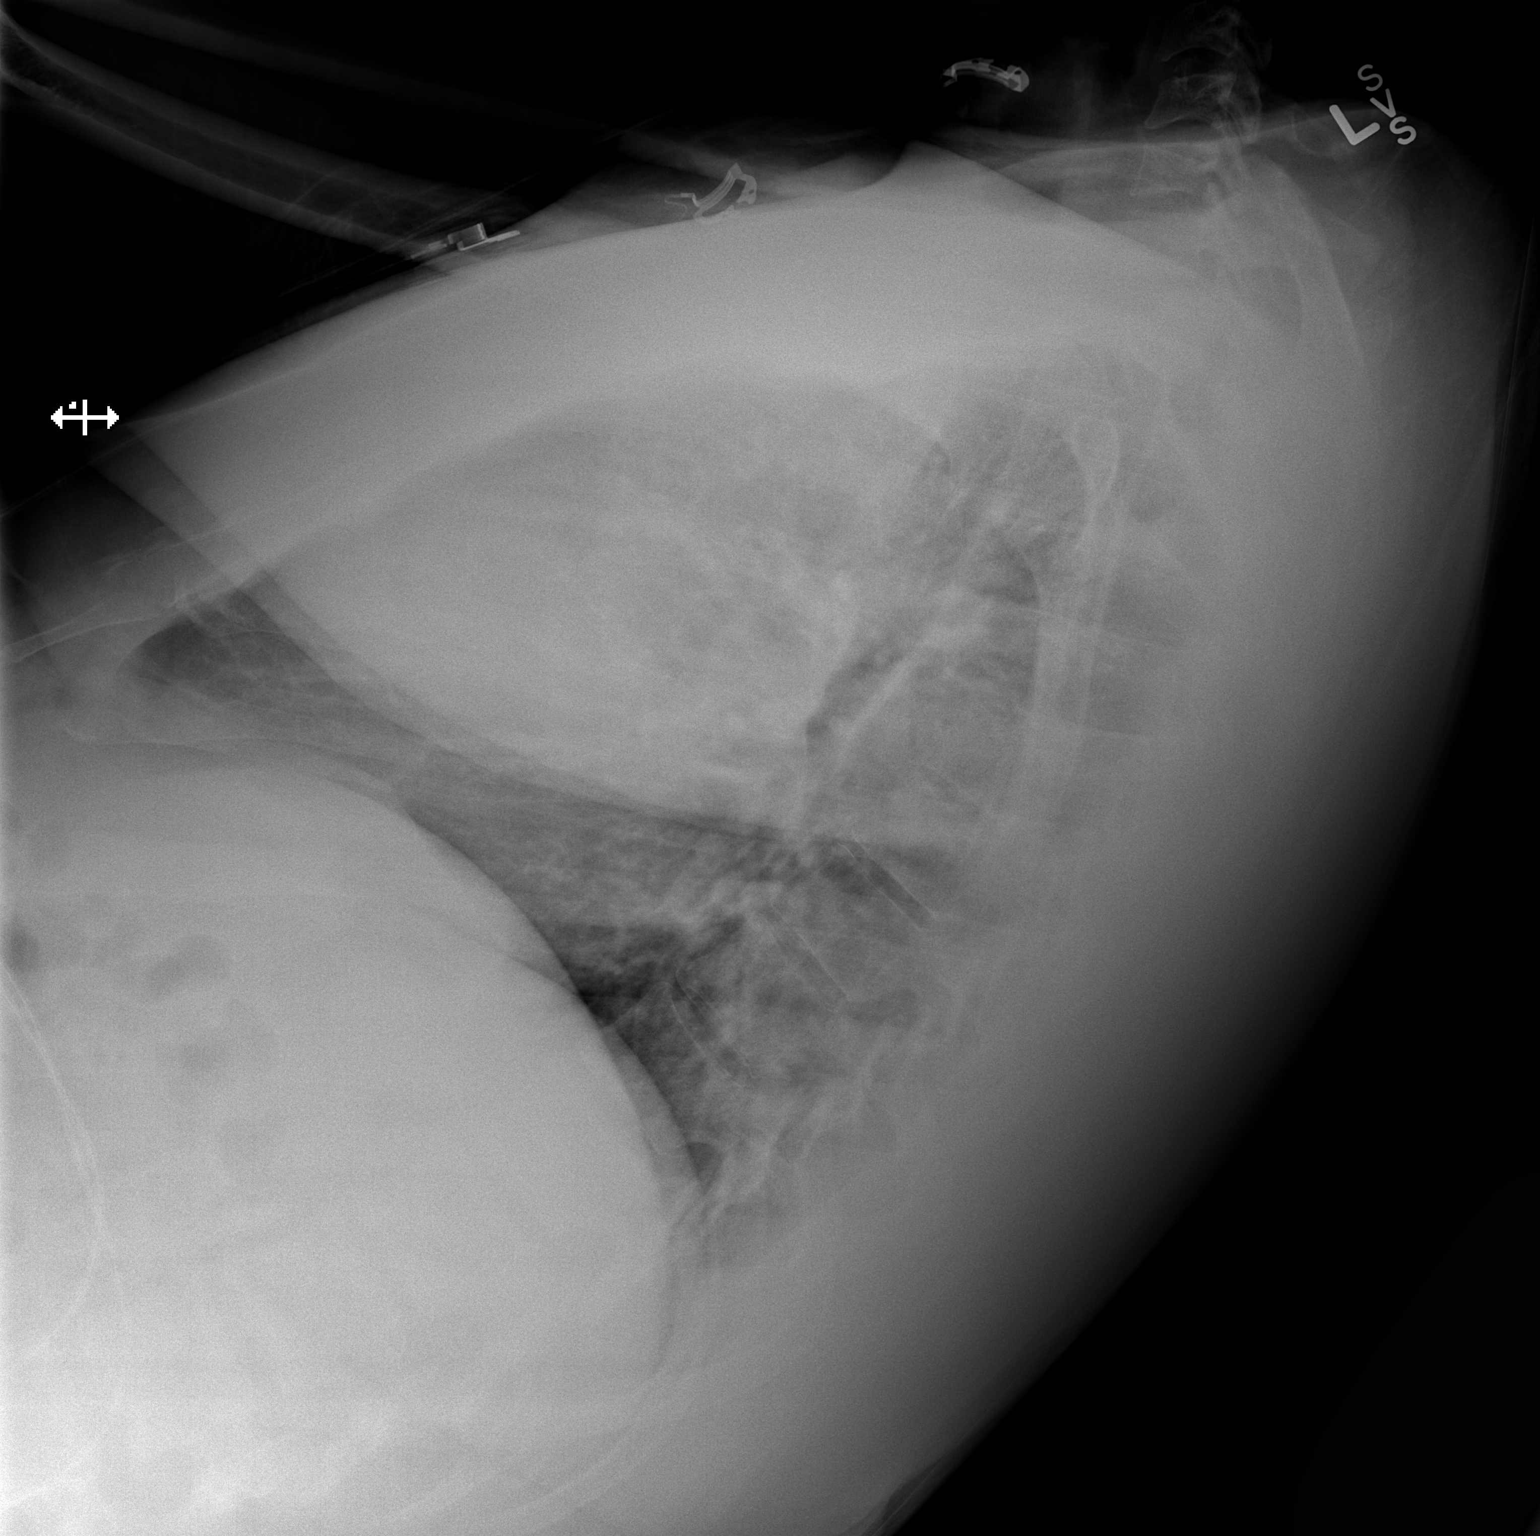

[x chest ap]
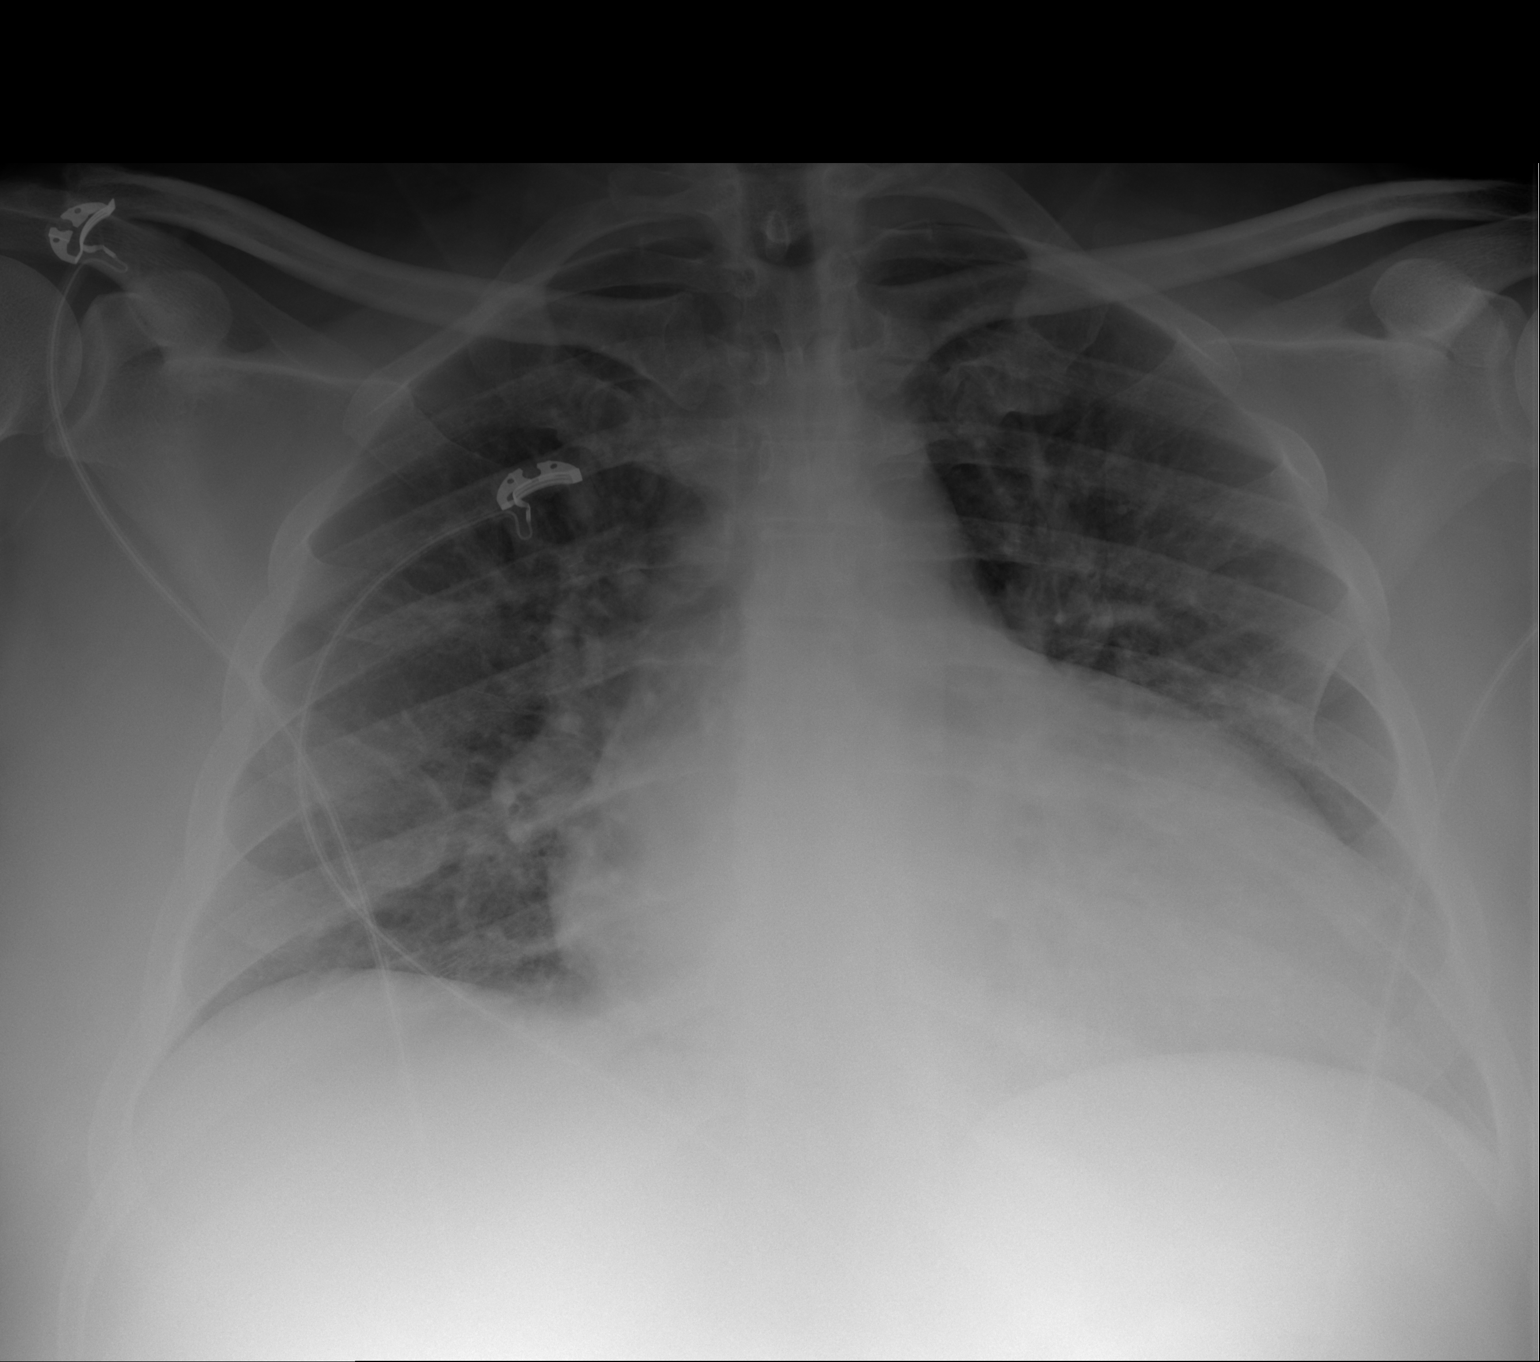

[2 of 2 positions shown; findings below may reference images not displayed]

FINDINGS: Stable substantial cardiomegaly.  There remains
interstitial and pulmonary venous prominence without overt airspace
edema or pleural fluid.
IMPRESSION: Stable cardiomegaly and changes consistent with pulmonary venous
hypertension/interstitial edema.

## 2012-10-04 ENCOUNTER — Ambulatory Visit: Payer: No Typology Code available for payment source

## 2012-10-09 ENCOUNTER — Telehealth (HOSPITAL_COMMUNITY): Payer: Self-pay | Admitting: Cardiology

## 2012-10-09 DIAGNOSIS — I5022 Chronic systolic (congestive) heart failure: Secondary | ICD-10-CM

## 2012-10-09 MED ORDER — CARVEDILOL 12.5 MG PO TABS: 12.5000 mg | ORAL_TABLET | Freq: Two times a day (BID) | ORAL | Status: DC

## 2012-10-09 MED ORDER — DIGOXIN 250 MCG PO TABS: 250.0000 ug | ORAL_TABLET | Freq: Every day | ORAL | Status: DC

## 2012-10-09 MED ORDER — LISINOPRIL 5 MG PO TABS: 5.0000 mg | ORAL_TABLET | Freq: Every day | ORAL | Status: DC

## 2012-10-09 MED ORDER — TORSEMIDE 20 MG PO TABS: 60.0000 mg | ORAL_TABLET | Freq: Every day | ORAL | Status: DC

## 2012-10-09 NOTE — Telephone Encounter (Signed)
Pt called to request all meds be sent to [facility]

## 2012-10-10 ENCOUNTER — Telehealth (HOSPITAL_COMMUNITY): Payer: Self-pay | Admitting: Cardiology

## 2012-10-10 NOTE — Telephone Encounter (Signed)
Pt recently [facility] They do not carry torsemide,  they do have furosemide, hydrochlorothiazide, and spirolactone  Can we change rx to one of these or should pt has this meds filled at outside pharm

## 2012-10-13 NOTE — Telephone Encounter (Signed)
Per VO Dr. Gala Romney pt would need to stay on Torsemide. Ok to send Rx to the [facility] pharm aware and rx sent to [facility] pharm  Pt aware

## 2012-10-17 IMAGING — CR DG WRIST COMPLETE 3+V*R*
4 series · 4 of 4 positions shown · non-contrast
Comparison: None.

CLINICAL DATA: Right wrist pain, no known injury

RIGHT WRIST - COMPLETE 3+ VIEW

[x wrist pa right]
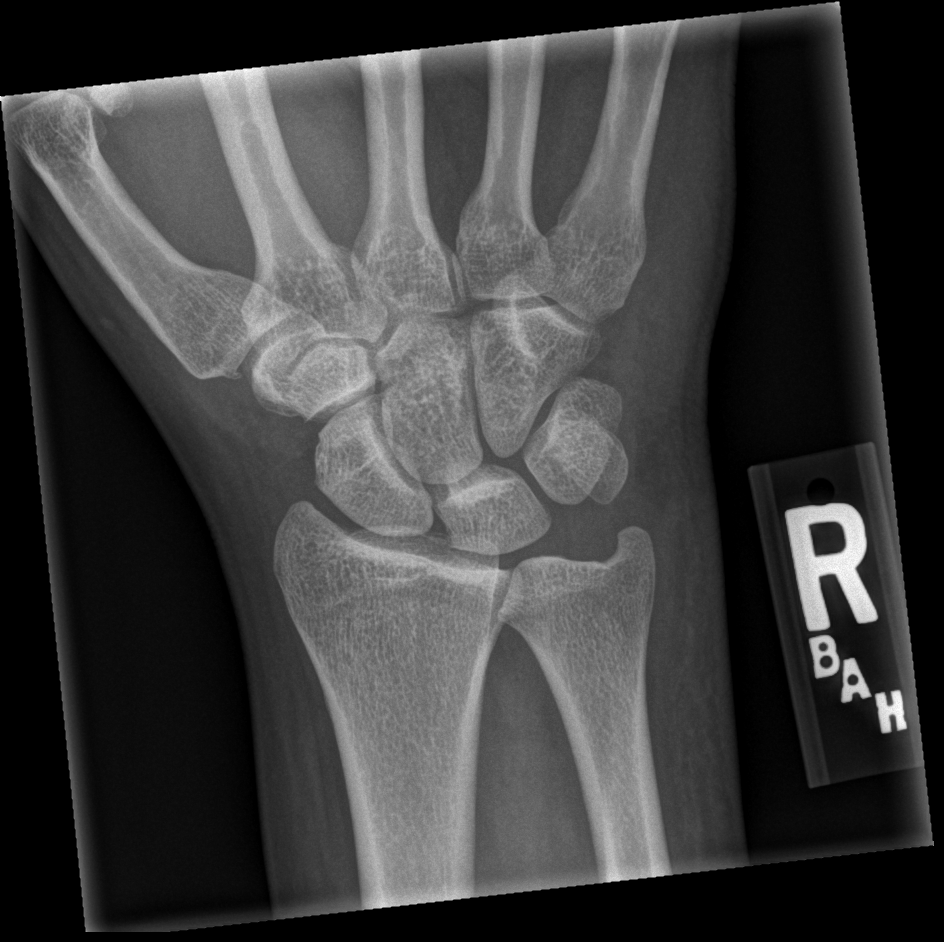

[x wrist obl right]
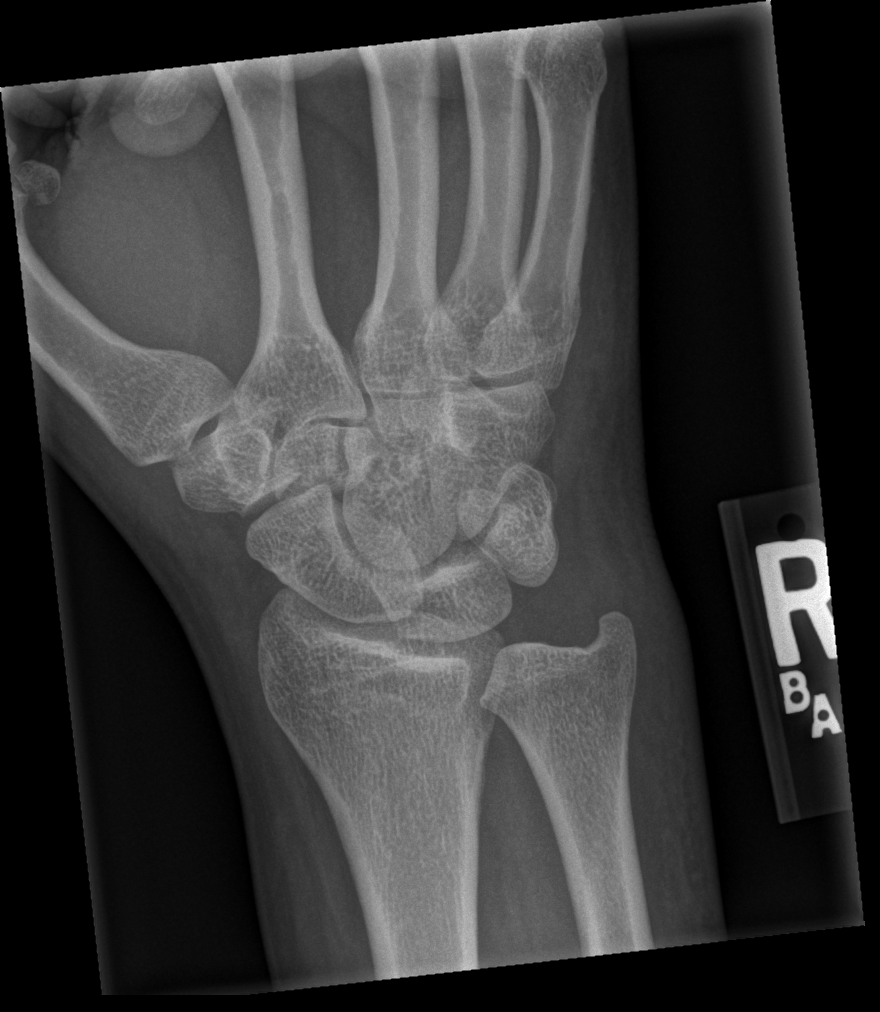

[x wrist lat right]
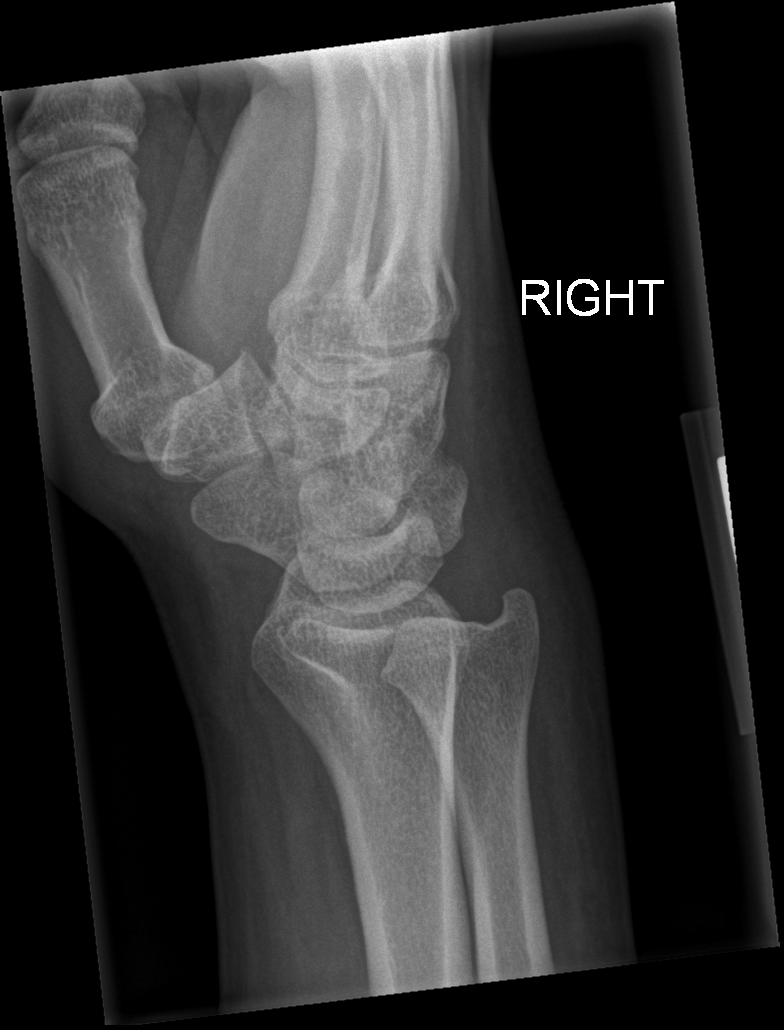

[x wrist navicular view right]
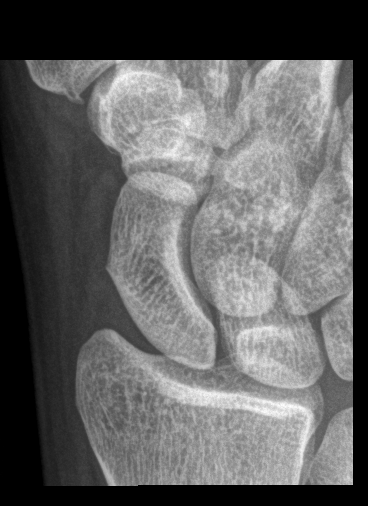

[4 of 4 positions shown; findings below may reference images not displayed]

FINDINGS: No fracture or dislocation is seen.

The joint spaces are preserved.

The visualized soft tissues are unremarkable.
IMPRESSION: No acute osseous abnormality is seen.

## 2012-11-03 ENCOUNTER — Encounter: Payer: Self-pay | Admitting: Cardiology

## 2012-11-03 ENCOUNTER — Ambulatory Visit (INDEPENDENT_AMBULATORY_CARE_PROVIDER_SITE_OTHER): Payer: Self-pay | Admitting: Cardiology

## 2012-11-03 VITALS — BP 109/77 | HR 80 | Ht 71.0 in | Wt 326.1 lb

## 2012-11-03 DIAGNOSIS — I472 Ventricular tachycardia, unspecified: Secondary | ICD-10-CM

## 2012-11-03 DIAGNOSIS — I5022 Chronic systolic (congestive) heart failure: Secondary | ICD-10-CM

## 2012-11-03 DIAGNOSIS — I428 Other cardiomyopathies: Secondary | ICD-10-CM

## 2012-11-03 DIAGNOSIS — Z95810 Presence of automatic (implantable) cardiac defibrillator: Secondary | ICD-10-CM

## 2012-11-03 NOTE — Progress Notes (Signed)
ELECTROPHYSIOLOGY OFFICE NOTE  Patient ID: William Davila MRN: [id number], DOB/AGE: 45-Mar-1967   Date of Visit: 11/07/2012  Primary Physician: Darden Palmer, MD Primary Cardiologist: Berton Mount, MD Reason for Visit: EP/device follow-up  History of Present Illness  William Davila is a 47 year old man with a nonischemic cardiomyopathy s/p ICD implant May 2013, obesity hypertension and dyslipidemia. He has a history of nonsustained ventricular tachycardia. Echo March 2013 - EF 20-25%, diffuse hypokinesis, grade 2 diastolic dysfunction, mildly dilated RV and RA, LA moderately dilated, mild MR.   He presents today for routine electrophysiology followup. Since last being seen in our clinic, he reports he is doing well. He has no complaints. Today, he denies chest pain or shortness of breath. He denies palpitations, dizziness, near syncope or syncope. He denies LE swelling, orthopnea, PND or recent weight gain. [name] reports that he is compliant and tolerating medications without difficulty.  Past Medical History Past Medical History  Diagnosis Date  . Chronic systolic heart failure     Diagnosed 2001; prior patient of Dr. Mendel Ryder with William Davila - discharged from practice  . Dilated cardiomyopathy     LVEF 10-20%  . Hyperlipidemia   . Essential hypertension, benign   . Morbid obesity   . Gout   . CHF (congestive heart failure)   . Cardiac arrhythmia   . Defibrillator activation   . 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 Arthritis     knee's    Past Surgical History Past Surgical History  Procedure Laterality Date  . Icd  10/27/2011    Allergies/Intolerances Allergies  Allergen Reactions  . Beta Adrenergic Blockers Other (See Comments)    REACTION: fatigue and increased dyspnea with beta blockers.  Marland Kitchen Isosorb Dinitrate-Hydralazine Other (See Comments)    REACTION: headache   Current Home Medications Current Outpatient Prescriptions  Medication Sig  Dispense Refill  . acetaminophen (TYLENOL) 500 MG tablet Take 1,000 mg by mouth every 6 (six) hours as needed. For pain      . carvedilol (COREG) 12.5 MG tablet Take 1 tablet (12.5 mg total) by mouth 2 (two) times daily with a meal.  60 tablet  6  . digoxin (LANOXIN) 0.25 MG tablet Take 1 tablet (250 mcg total) by mouth daily.  30 tablet  6  . lisinopril (PRINIVIL,ZESTRIL) 5 MG tablet Take 1 tablet (5 mg total) by mouth daily.  30 tablet  5  . torsemide (DEMADEX) 20 MG tablet Take 3 tablets (60 mg total) by mouth daily.  90 tablet  6   No current facility-administered medications for this visit.   Social History Social History  . Marital Status: Single   Social History Main Topics  . Smoking status: Never Smoker   . Smokeless tobacco: Never Used  . Alcohol Use: No  . Drug Use: No    Review of Systems General: No chills, fever, night sweats or weight changes Cardiovascular: No chest pain, dyspnea on exertion, edema, orthopnea, palpitations, paroxysmal nocturnal dyspnea Dermatological: No rash, lesions or masses Respiratory: No cough, dyspnea Urologic: No hematuria, dysuria Abdominal: No nausea, vomiting, diarrhea, bright red blood per rectum, melena, or hematemesis Neurologic: No visual changes, weakness, changes in mental status All other systems reviewed and are otherwise negative except as noted above.  Physical Exam Blood pressure 109/77, pulse 80, height 5\' 11"  (1.803 m), weight 326 lb 1.9 oz (147.927 kg).  General: Well developed, well appearing 47 year old male in no acute distress.  HEENT: Normocephalic, atraumatic. EOMs intact. Sclera nonicteric. Oropharynx clear.  Neck: Supple without bruits. No JVD. Lungs: Respirations regular and unlabored, CTA bilaterally. No wheezes, rales or rhonchi. Heart: RRR. S1, S2 present. No murmurs, rub, S3 or S4. Abdomen: Soft, non-distended.  Extremities: No clubbing, cyanosis or edema. PT/Radials 2+ and equal bilaterally. Psych: Normal  affect. Neuro: Alert and oriented X 3. Moves all extremities spontaneously.   Diagnostics Device interrogation today - Normal device function. Thresholds and sensing consistent with previous device measurements. Impedance trends stable over time. 4 VT-NS episodes, EGMs consistent with monomorphic VT. Histogram distribution appropriate for patient and level of activity. OptiVol reviewed and stable for patient. No changes made this session. Device programmed at appropriate safety margins. Device programmed to optimize intrinsic conduction.   Assessment and Plan 1. Nonischemic CM s/p ICD implant Normal device function No programming changes made Continue routine follow-up in device clinic every 3 months Return to clinic for follow-up with me or Dr. Graciela Husbands in one year 2. NSVT Stable; episodes are brief and asymptomatic Continue BB 3. Chronic systolic HF Stable; euvolemic by exam today OptiVol reviewed  Continue medical therapy  Signed, Rick Duff, PA-C 11/07/2012, 4:40 PM

## 2012-11-03 NOTE — Patient Instructions (Addendum)
Your physician recommends that you schedule a follow-up appointment on: 02/07/2013 @ 2:00 pm. Device Clinic.  Your physician wants you to follow-up in: 1 year with Dr. Graciela Husbands. You will receive a reminder letter in the mail two months in advance. If you don't receive a letter, please call our office to schedule the follow-up appointment.  Your physician recommends that you continue on your current medications as directed. Please refer to the Current Medication list given to you today.

## 2012-11-07 ENCOUNTER — Encounter: Payer: Self-pay | Admitting: Cardiology

## 2012-11-07 ENCOUNTER — Encounter: Payer: Self-pay | Admitting: Internal Medicine

## 2013-01-09 ENCOUNTER — Encounter: Payer: Self-pay | Admitting: Internal Medicine

## 2013-01-09 ENCOUNTER — Ambulatory Visit (INDEPENDENT_AMBULATORY_CARE_PROVIDER_SITE_OTHER): Payer: No Typology Code available for payment source | Admitting: Internal Medicine

## 2013-01-09 VITALS — BP 123/77 | HR 81 | Temp 97.0°F | Ht 71.0 in | Wt 337.0 lb

## 2013-01-09 DIAGNOSIS — L280 Lichen simplex chronicus: Secondary | ICD-10-CM

## 2013-01-09 DIAGNOSIS — I5022 Chronic systolic (congestive) heart failure: Secondary | ICD-10-CM

## 2013-01-09 DIAGNOSIS — IMO0002 Reserved for concepts with insufficient information to code with codable children: Secondary | ICD-10-CM

## 2013-01-09 DIAGNOSIS — E785 Hyperlipidemia, unspecified: Secondary | ICD-10-CM

## 2013-01-09 LAB — BASIC METABOLIC PANEL
BUN: 16 mg/dL (ref 6–23)
Calcium: 9.5 mg/dL (ref 8.4–10.5)
Glucose, Bld: 88 mg/dL (ref 70–99)
Sodium: 139 mEq/L (ref 135–145)

## 2013-01-09 NOTE — Assessment & Plan Note (Signed)
Continue to follow with cardiology--Dr. Graciela Husbands.  Is not on a statin, will ask cardiology if any reason in particular not started.

## 2013-01-09 NOTE — Patient Instructions (Addendum)
General Instructions:  Please try to work on your weight again and consider adding exercise as tolerated to your regimen and controlling your portion size and carb intake  Follow up with cardiology as scheduled   Treatment Goals:  Goals (1 Years of Data) as of 01/09/13   None      Progress Toward Treatment Goals:  Treatment Goal 01/09/2013  Blood pressure at goal    Self Care Goals & Plans:  Self Care Goal 01/09/2013  Manage my medications take my medicines as prescribed; bring my medications to every visit; refill my medications on time  Be physically active find an activity I enjoy; take a walk every day    Care Management & Community Referrals:

## 2013-01-09 NOTE — Assessment & Plan Note (Addendum)
Low HDL in 2012.  Not fasting today.  LDL was 62 at that time with cholesterol of 100 and HDL of 27.  Per CHD equivalent his LDL goal of <100 already achieved and non-HDL goal of <130 also achieved since his LDL was 62 and nonHDL was 72.    Given significant CAD and obesity, may benefit from statin therapy per new guidelines -will ask cardiology if benefit to add stain in someone like him with obesity and low HDL -did not check lipid panel today since not fasting

## 2013-01-09 NOTE — Assessment & Plan Note (Signed)
BP Readings from Last 3 Encounters:  01/09/13 123/77  11/03/12 109/77  02/03/12 104/72   Lab Results  Component Value Date   NA 142 10/18/2011   K 4.1 10/18/2011   CREATININE 1.03 10/18/2011    Assessment: Blood pressure control: controlled Progress toward BP goal:  at goal  Plan: Medications:  continue current medications coreg 12.5mg  he takes it daily and digoxin , lisinopril 5mg , and torsemide 60mg  Educational resources provided:   Self management tools provided:   Other plans: f/u bmet today

## 2013-01-09 NOTE — Progress Notes (Signed)
Subjective:   Patient ID: William Davila male   DOB: February 10, 1966 47 y.o.   MRN: [id number]  HPI: William Davila is a 47 y.o. male with PMH of CHF combined systolic and diastolic (grade 2) per ECHO 08/2011 EF 20-25%, nonischemic cardiomyopathy s/p ICD placement on 10/28/11, nonsustained ventricular tachycardia, obesity, HTN, dyslipidemia, and chronic bilateral knee pain who presents to [facility] today for followup. He was last seen in Dr. Odessa Fleming EP office in 10/2012 and reported doing well at that time with normal device function.  He will follow up with their office next year.   In regards to his weight, he appears to have gained 11 pounds since his cardiology appointment in may 2014.  Today his weight is 337lbs. He reports being bored lately and too hot to exercise or walk outside and has limited transportation, thus he likely has been eating more then usual. We discussed need for weight loss and resuming exercising and watching his portions and diet as much as possible.  He will try to get back to his original weight and hopefully lose more weight.    He denies any sob, fever, chills, chest pain, N/V/D, abdominal pain, or any urinary complaints.  He does report an itchy rash on his left foot for some time, it is getting better he says after he put some ointment on there that he got from his grand father.  He does not know the name but he did ask me for some samples of any itch cream.  At this time, it appears to be improving and healing and scabbing where he scratches.  I advised to avoid scratching as much as possible and to try otc topical ointments or calamine lotion to see if that will help soothe the itching.   His BP is well controlled although he says he only takes his coreg once a day although he is prescribed twice a day because he says when he takes it twice a day he feels funny.  He is adherent with his other medications including lisinopril and digoxin along with torsemide.   Past Medical  History  Diagnosis Date  . Chronic systolic heart failure     Diagnosed 2001; prior patient of Dr. Mendel Ryder with [facility] - discharged from practice  . Dilated cardiomyopathy     LVEF 10-20%  . Hyperlipidemia   . Essential hypertension, benign   . Morbid obesity   . Gout   . CHF (congestive heart failure)   . Cardiac arrhythmia   . Defibrillator activation   . 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 Arthritis     knee's   Current Outpatient Prescriptions  Medication Sig Dispense Refill  . acetaminophen (TYLENOL) 500 MG tablet Take 1,000 mg by mouth every 6 (six) hours as needed. For pain      . carvedilol (COREG) 12.5 MG tablet Take 1 tablet (12.5 mg total) by mouth 2 (two) times daily with a meal.  60 tablet  6  . digoxin (LANOXIN) 0.25 MG tablet Take 1 tablet (250 mcg total) by mouth daily.  30 tablet  6  . lisinopril (PRINIVIL,ZESTRIL) 5 MG tablet Take 1 tablet (5 mg total) by mouth daily.  30 tablet  5  . torsemide (DEMADEX) 20 MG tablet Take 3 tablets (60 mg total) by mouth daily.  90 tablet  6   No current facility-administered medications for this visit.   Family History  Problem Relation Age of Onset  . Hypertension  Mother    History   Social History  . Marital Status: Single    Spouse Name: N/A    Number of Children: N/A  . Years of Education: N/A   Social History Main Topics  . Smoking status: Never Smoker   . Smokeless tobacco: Never Used  . Alcohol Use: No  . Drug Use: No  . Sexually Active: Yes   Other Topics Concern  . Not on file   Social History Narrative  . No narrative on file   Review of Systems:  Constitutional:  Denies fever, chills, diaphoresis, and fatigue.   HEENT:  Denies congestion, sore throat, rhinorrhea  Respiratory:  Denies SOB, cough, and wheezing.   Cardiovascular:  Denies chest pain, palpitations, and leg swelling.   Gastrointestinal:  Denies nausea, vomiting, abdominal pain, diarrhea, constipation, and  abdominal distention.   Genitourinary:  Denies dysuria, urgency, frequency, hematuria, flank pain and difficulty urinating.   Musculoskeletal:  B/l knee pain.  Denies myalgias, back pain, joint swelling, and gait problem.   Skin:  Itchy patches of rash on left foot.  Neurological:  Denies dizziness, seizures, syncope, weakness, light-headedness, numbness and headaches.    Objective:  Physical Exam: Filed Vitals:   01/09/13 1457  BP: 123/77  Pulse: 81  Temp: 97 F (36.1 C)  TempSrc: Oral  Height: 5\' 11"  (1.803 m)  Weight: 337 lb (152.862 kg)  SpO2: 95%   Vitals reviewed. General: sitting in chair, NAD HEENT: PERRL, EOMI, no scleral icterus Cardiac: RRR, no rubs, murmurs or gallops Pulm: clear to auscultation bilaterally, no wheezes, rales, or rhonchi Abd: soft, nontender, nondistended, BS present Ext: warm and well perfused, no pedal edema, +2DP B/L. +dry hyperpigmented lichenified small patches x2 on dorsal surface left foot and left leg Neuro: alert and oriented X3, cranial nerves II-XII grossly intact, strength and sensation to light touch equal in bilateral upper and lower extremities  Assessment & Plan:  Discussed with Dr. Aundria Rud F/u bmet

## 2013-01-09 NOTE — Assessment & Plan Note (Signed)
2 patches on dorsal aspect of left foot, hyperpigmented and itchy along with two patches on left leg below knee and one on right leg as well.  He continues to scratch the area at times where it then scabs.  Relief with topical ointment but does not recall what it is. Asked for samples of anti-itch cream today.  No samples available in opc.    -try otc calamine lotion or topical corticosteroid ointment -try to avoid scratching as much as possible

## 2013-01-09 NOTE — Progress Notes (Signed)
Case discussed with Dr. Qureshi (at time of visit, soon after the resident saw the patient).  We reviewed the resident's history and exam and pertinent patient test results.  I agree with the assessment, diagnosis, and plan of care documented in the resident's note.  

## 2013-02-07 ENCOUNTER — Encounter: Payer: Self-pay | Admitting: Internal Medicine

## 2013-02-07 ENCOUNTER — Ambulatory Visit (INDEPENDENT_AMBULATORY_CARE_PROVIDER_SITE_OTHER): Payer: No Typology Code available for payment source | Admitting: *Deleted

## 2013-02-07 DIAGNOSIS — I428 Other cardiomyopathies: Secondary | ICD-10-CM

## 2013-02-07 DIAGNOSIS — I5022 Chronic systolic (congestive) heart failure: Secondary | ICD-10-CM

## 2013-02-07 LAB — ICD DEVICE OBSERVATION
AL IMPEDENCE ICD: 4047 Ohm
CHARGE TIME: 8.988 s
FVT: 0
LV LEAD IMPEDENCE ICD: 4047 Ohm
RV LEAD IMPEDENCE ICD: 399 Ohm
RV LEAD THRESHOLD: 1.25 V
TZAT-0001ATACH: 1
TZAT-0001ATACH: 2
TZAT-0001ATACH: 3
TZAT-0001FASTVT: 1
TZAT-0001SLOWVT: 1
TZAT-0001SLOWVT: 2
TZAT-0002ATACH: NEGATIVE
TZAT-0002FASTVT: NEGATIVE
TZAT-0004SLOWVT: 8
TZAT-0004SLOWVT: 8
TZAT-0012ATACH: 150 ms
TZAT-0012ATACH: 150 ms
TZAT-0012FASTVT: 170 ms
TZAT-0012SLOWVT: 170 ms
TZAT-0012SLOWVT: 170 ms
TZAT-0013SLOWVT: 2
TZAT-0013SLOWVT: 2
TZAT-0018ATACH: NEGATIVE
TZAT-0018ATACH: NEGATIVE
TZAT-0018ATACH: NEGATIVE
TZAT-0018FASTVT: NEGATIVE
TZAT-0018SLOWVT: NEGATIVE
TZAT-0018SLOWVT: NEGATIVE
TZAT-0019SLOWVT: 8 V
TZAT-0019SLOWVT: 8 V
TZAT-0020ATACH: 1.5 ms
TZAT-0020ATACH: 1.5 ms
TZAT-0020SLOWVT: 1.5 ms
TZAT-0020SLOWVT: 1.5 ms
TZON-0003ATACH: 350 ms
TZON-0003VSLOWVT: 350 ms
TZON-0004SLOWVT: 32
TZON-0004VSLOWVT: 36
TZST-0001ATACH: 4
TZST-0001ATACH: 6
TZST-0001FASTVT: 4
TZST-0001FASTVT: 6
TZST-0001SLOWVT: 3
TZST-0001SLOWVT: 5
TZST-0002ATACH: NEGATIVE
TZST-0002ATACH: NEGATIVE
TZST-0002ATACH: NEGATIVE
TZST-0002FASTVT: NEGATIVE
TZST-0002FASTVT: NEGATIVE
TZST-0002FASTVT: NEGATIVE
TZST-0003SLOWVT: 35 J
TZST-0003SLOWVT: 35 J

## 2013-02-07 NOTE — Progress Notes (Signed)
ICD check with ICM in office. 

## 2013-04-19 ENCOUNTER — Ambulatory Visit: Payer: Self-pay

## 2013-04-25 ENCOUNTER — Ambulatory Visit: Payer: Medicare Other

## 2013-05-02 ENCOUNTER — Other Ambulatory Visit: Payer: Self-pay

## 2013-05-02 DIAGNOSIS — I5022 Chronic systolic (congestive) heart failure: Secondary | ICD-10-CM

## 2013-05-02 MED ORDER — LISINOPRIL 5 MG PO TABS: 5.0000 mg | ORAL_TABLET | Freq: Every day | ORAL | Status: DC

## 2013-05-15 ENCOUNTER — Encounter: Payer: Self-pay | Admitting: Internal Medicine

## 2013-05-15 ENCOUNTER — Ambulatory Visit (INDEPENDENT_AMBULATORY_CARE_PROVIDER_SITE_OTHER): Payer: No Typology Code available for payment source | Admitting: Internal Medicine

## 2013-05-15 ENCOUNTER — Encounter (INDEPENDENT_AMBULATORY_CARE_PROVIDER_SITE_OTHER): Payer: Self-pay

## 2013-05-15 VITALS — BP 120/88 | HR 90 | Ht 70.0 in | Wt 346.1 lb

## 2013-05-15 DIAGNOSIS — I428 Other cardiomyopathies: Secondary | ICD-10-CM

## 2013-05-15 DIAGNOSIS — I472 Ventricular tachycardia: Secondary | ICD-10-CM

## 2013-05-15 DIAGNOSIS — I5022 Chronic systolic (congestive) heart failure: Secondary | ICD-10-CM

## 2013-05-15 DIAGNOSIS — Z95810 Presence of automatic (implantable) cardiac defibrillator: Secondary | ICD-10-CM

## 2013-05-15 LAB — BASIC METABOLIC PANEL
BUN: 15 mg/dL (ref 6–23)
Creatinine, Ser: 1.1 mg/dL (ref 0.4–1.5)
GFR: 89.02 mL/min (ref >60.00)
Potassium: 4.1 mEq/L (ref 3.5–5.1)

## 2013-05-15 LAB — MDC_IDC_ENUM_SESS_TYPE_INCLINIC
Brady Statistic AP VS Percent: 0 %
Brady Statistic AS VS Percent: 100 %
Brady Statistic RV Percent Paced: 0 %
HighPow Impedance: 59 Ohm
HighPow Impedance: 77 Ohm
Lead Channel Impedance Value: 4047 Ohm
Lead Channel Impedance Value: 4047 Ohm
Lead Channel Impedance Value: 4047 Ohm
Lead Channel Impedance Value: 437 Ohm
Lead Channel Pacing Threshold Pulse Width: 0.4 ms
Lead Channel Sensing Intrinsic Amplitude: 0.375 mV
Lead Channel Setting Pacing Amplitude: 2.5 V
Lead Channel Setting Pacing Pulse Width: 0.4 ms
Lead Channel Setting Sensing Sensitivity: 0.3 mV
Zone Setting Detection Interval: 250 ms
Zone Setting Detection Interval: 350 ms

## 2013-05-15 MED ORDER — CARVEDILOL 25 MG PO TABS: 25.0000 mg | ORAL_TABLET | Freq: Two times a day (BID) | ORAL | Status: DC

## 2013-05-15 NOTE — Progress Notes (Signed)
      Patient Care Team: Darden Palmer, MD as PCP - General (Internal Medicine)   HPI  William Davila is a 47 y.o. 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 He has a history of nonsustained ventricular tachycardia.  Echo 3/13--EF 20-25% with mild MR.  He has problems with exercise related to his knees and weight. He has daytime somnolence but does not think it is excessive. He's had no edema. He has chronic dyspnea on exertion He is bored and is unable to get around because of transportation issues  Past Medical History  Diagnosis Date  . Chronic systolic heart failure     Diagnosed 2001; prior patient of Dr. Mendel Ryder with [facility] - discharged from practice  . Dilated cardiomyopathy     LVEF 10-20%  . Hyperlipidemia   . Essential hypertension, benign   . Morbid obesity   . Gout   . CHF (congestive heart failure)   . Cardiac arrhythmia   . Defibrillator activation   . 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 Arthritis     knee's    Past Surgical History  Procedure Laterality Date  . Icd  10/27/2011    Current Outpatient Prescriptions  Medication Sig Dispense Refill  . acetaminophen (TYLENOL) 500 MG tablet Take 1,000 mg by mouth every 6 (six) hours as needed. For pain      . carvedilol (COREG) 12.5 MG tablet Take 1 tablet (12.5 mg total) by mouth 2 (two) times daily with a meal.  60 tablet  6  . digoxin (LANOXIN) 0.25 MG tablet Take 1 tablet (250 mcg total) by mouth daily.  30 tablet  6  . lisinopril (PRINIVIL,ZESTRIL) 5 MG tablet Take 1 tablet (5 mg total) by mouth daily.  30 tablet  5  . torsemide (DEMADEX) 20 MG tablet Take 3 tablets (60 mg total) by mouth daily.  90 tablet  6   No current facility-administered medications for this visit.    Allergies  Allergen Reactions  . Beta Adrenergic Blockers Other (See Comments)    REACTION: fatigue and increased  dyspnea with beta blockers.  . Isosorb Dinitrate-Hydralazine Other (See Comments)    REACTION: headache    Review of Systems negative except from HPI and PMH  Physical Exam BP 120/88  Pulse 90  Ht 5\' 10"  (1.778 m)  Wt 346 lb 1.9 oz (156.999 kg)  BMI 49.66 kg/m2 Well developed and well nourished in no acute distress HENT normal E scleral and icterus clear Neck Supple JVP flat; carotids brisk and full Clear to ausculation  Regular rate and rhythm, no murmurs gallops or rub Soft with active bowel sounds No clubbing cyanosis none Edema Alert and oriented, grossly normal motor and sensory function Skin Warm and Dry    Assessment and  Plan

## 2013-05-15 NOTE — Assessment & Plan Note (Signed)
euvoleimic

## 2013-05-15 NOTE — Patient Instructions (Signed)
Your physician has recommended you make the following change in your medication:  1) Increase your Carvedilol to 25 mg twice daily  Your physician recommends that you return for lab work today: BMET/Dig level  Your physician recommends that you schedule a follow-up appointment in: 3 months with device clinic.   Your physician wants you to follow-up in: 6 months with [facility].  You will receive a reminder letter in the mail two months in advance. If you don't receive a letter, please call our office to schedule the follow-up appointment.  Your physician wants you to follow-up in: one year with Dr. Graciela Husbands.  You will receive a reminder letter in the mail two months in advance. If you don't receive a letter, please call our office to schedule the follow-up appointment.

## 2013-05-15 NOTE — Assessment & Plan Note (Signed)
No intercurrent Ventricular tachycardia  

## 2013-05-15 NOTE — Assessment & Plan Note (Signed)
The patient's device was interrogated.  The information was reviewed. No changes were made in the programming.    

## 2013-05-16 LAB — DIGOXIN LEVEL: Digoxin Level: 0.9 ng/mL (ref 0.8–2.0)

## 2013-05-18 NOTE — Progress Notes (Signed)
Quick Note:  Patient notified of lab results from 05/16/13. Results "normal" with no changes to current treatment plan. Patient verbalized understanding and agreement with current treatment plan. ______

## 2013-07-10 ENCOUNTER — Other Ambulatory Visit (HOSPITAL_COMMUNITY): Payer: Self-pay | Admitting: Adult Health

## 2013-07-25 ENCOUNTER — Encounter: Payer: Self-pay | Admitting: Internal Medicine

## 2013-07-25 ENCOUNTER — Ambulatory Visit (INDEPENDENT_AMBULATORY_CARE_PROVIDER_SITE_OTHER): Payer: No Typology Code available for payment source | Admitting: Internal Medicine

## 2013-07-25 ENCOUNTER — Ambulatory Visit (HOSPITAL_COMMUNITY)
Admission: RE | Admit: 2013-07-25 | Discharge: 2013-07-25 | Disposition: A | Discharge status: Home / Self Care | Payer: No Typology Code available for payment source | Source: Ambulatory Visit | Attending: Internal Medicine | Admitting: Internal Medicine

## 2013-07-25 VITALS — BP 122/80 | HR 93 | Temp 97.3°F | Ht 70.0 in | Wt 355.6 lb

## 2013-07-25 DIAGNOSIS — IMO0002 Reserved for concepts with insufficient information to code with codable children: Secondary | ICD-10-CM | POA: Insufficient documentation

## 2013-07-25 DIAGNOSIS — M948X9 Other specified disorders of cartilage, unspecified sites: Secondary | ICD-10-CM | POA: Insufficient documentation

## 2013-07-25 DIAGNOSIS — M109 Gout, unspecified: Secondary | ICD-10-CM

## 2013-07-25 DIAGNOSIS — M25562 Pain in left knee: Secondary | ICD-10-CM

## 2013-07-25 DIAGNOSIS — M25469 Effusion, unspecified knee: Secondary | ICD-10-CM

## 2013-07-25 DIAGNOSIS — M25569 Pain in unspecified knee: Secondary | ICD-10-CM

## 2013-07-25 DIAGNOSIS — M25561 Pain in right knee: Secondary | ICD-10-CM

## 2013-07-25 DIAGNOSIS — M1710 Unilateral primary osteoarthritis, unspecified knee: Secondary | ICD-10-CM | POA: Insufficient documentation

## 2013-07-25 MED ORDER — IBUPROFEN 200 MG PO TABS: 800.0000 mg | ORAL_TABLET | Freq: Three times a day (TID) | ORAL | Status: DC

## 2013-07-25 NOTE — Patient Instructions (Signed)
Thank you for your visit.  Today we discussed your knee pain. This is most likely caused by degenerative changes to your left knee. However, we wanted to rule out anything acute going on. I ordered an xray of your left knee. I will call you with these results.  I prescribed ibuprofen 800mg  to be taken three times per day for 2 weeks. Please return to clinic in 2 weeks. However, please call our clinic next week if you have had no improvement of your pain with the ibuprofen and we will see you sooner.   In addition, you can try icing your knee for 20 minute intervals a few times per day if this provides relief. OR you can use a heating pad if this helps.

## 2013-07-25 NOTE — Assessment & Plan Note (Addendum)
Patient with acute on chronic L knee pain that began a few days ago. There is visible and palpable swelling just distal to the patella, right over the insertion point of the patellar ligament. There is no tenderness over the joint line. There is no erythema, warmth, or noticeable joint effusion which makes septic joint or gout very unlikely. Additionally, I do not believe this represents septic joint as patient feels well overall and has not had F/C or generalized malaise. Given the location of the pain and the noticeable effusion over the patellar ligament my DDX includes patellar ligament tear or rupture, patellofemoral pain syndrome (pain is worse with prolonged sitting), extensor knee tendonitis.  -xray L knee, I will call patient with results -ibuprofen 800mg  TID for 2 weeks -f/u in 2 weeks OR in 1 week if no pain relief w/ ibuprofen -ice or heat L knee as desired  -continue using crutches to maintain balance   If patient returns to clinic and his symptoms have significantly improved or resolved, I believe it would be reasonable to refer him for PT as he has chronic b/l knee problems. Conversely, if patient returns to clinic with little to no improvement of his symptoms patient may need MRI of L knee to further evaluate for ligamentous/tendonous abnormalities.   ADDENDUM: 07/26/2013 9:07 AM  Upon further chart review, patient has hx of partial quadriceps tendon tear on L side. While I do not believe patient has a quadriceps tendon tear, I do think it is possible that he has patellar ligament tear. 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 Patient may benefit from referral to sports medicine and/or orthopaedics. Once this acute episode has resolved, I believe that PT would also be beneficial.

## 2013-07-25 NOTE — Progress Notes (Signed)
Patient ID: William Davila, male   DOB: [birth year], 48 y.o.   MRN: [id number] HPI The patient is a 48 y.o. 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  Patient c/o having L knee pain and swelling that began on Sunday. The pain worsened on Mon/Tues to the point that the throbbing kept him up at night. He has been taking Tylenol with some relief of symptoms. Today he thinks the pain has improved somewhat, but he is still limping and using crutches for balance. He denies any redness to the knee, F/C, altered sensation to his legs, pain in his L calf or behind his L knee. Patient notes the pain is initially improved by walking slowly on it, but after walking some distance the pain worsens. The pain is also made worse by prolonged sitting. Denies that his knee ever "pops" or gives out. Patient had a similar episode in his R knee in 2013 at which time he had an xray of his R knee showing degenerative changes. Patient has chronic knee pain to his L knee as well and had an xray done of L knee in 2012 which showed a small joint effusion and chondromalacia patella. This knee pain is much more severe than his usual knee pain.   ROS: General: no fevers, chills Skin: no rash HEENT: no sore throat Pulm: no dyspnea, coughing, wheezing CV: no chest pain, palpitations, shortness of breath Abd: no abdominal pain, nausea/vomiting, diarrhea/constipation Ext: see HPI Neuro: no weakness, numbness, or tingling  Filed Vitals:   07/25/13 1348  BP: 122/80  Pulse: 93  Temp: 97.3 F (36.3 C)  SpO2 97% on r/a  Physical Exam  General: alert, cooperative, and in no apparent distress HEENT: pupils equal round and reactive to light, vision grossly intact, oropharynx clear and  non-erythematous  Neck: supple Lungs: clear to ascultation bilaterally, normal work of respiration Heart: regular rate and rhythm, no murmurs, gallops, or rubs Abdomen: obese; soft, non-tender, non-distended, normal bowel sounds Extremities: warm b/l, now pedal edema; L knee is visibly more swollen compared to his R knee, though there is no erythema or warmth to L knee; active and passive ROM is decreased 2/2 pain; negative mcmurray, ant/post drawer tests; there is TTP and palpable edema just distal to his patella; there is no TTP over joint line; no ballotement present; antalgic gait Neurologic: alert & oriented X3, cranial nerves II-XII grossly intact, strength grossly intact, sensation intact to light touch  Current Outpatient Prescriptions on File Prior to Visit  Medication Sig Dispense Refill  . acetaminophen (TYLENOL) 500 MG tablet Take 1,000 mg by mouth every 6 (six) hours as needed. For pain      . carvedilol (COREG) 25 MG tablet Take 1 tablet (25 mg total) by mouth 2 (two) times daily.  60 tablet  6  . digoxin (LANOXIN) 0.25 MG tablet Take 1 tablet (250 mcg total) by mouth daily.  30 tablet  6  . lisinopril (PRINIVIL,ZESTRIL) 5 MG tablet Take 1 tablet (5 mg total) by mouth daily.  30 tablet  5  . torsemide (DEMADEX) 20 MG tablet Take 3 tablets (60 mg total) by mouth daily. MUST HAVE FOLLOW UP APPOINTMENT FOR FURTHER REFILLS  90 tablet  0   No current facility-administered medications on file prior to visit.  Assessment/Plan

## 2013-08-01 ENCOUNTER — Other Ambulatory Visit: Payer: Self-pay | Admitting: Internal Medicine

## 2013-08-01 DIAGNOSIS — M25562 Pain in left knee: Secondary | ICD-10-CM

## 2013-08-03 NOTE — Progress Notes (Signed)
Case discussed with Dr. Joan Flores soon after the resident saw the patient.  We reviewed the resident's history and exam and pertinent patient test results.  I agree with the assessment, diagnosis, and plan of care documented in the resident's note.

## 2013-08-07 ENCOUNTER — Encounter: Payer: No Typology Code available for payment source | Admitting: Internal Medicine

## 2013-08-08 ENCOUNTER — Emergency Department (INDEPENDENT_AMBULATORY_CARE_PROVIDER_SITE_OTHER)
Admission: EM | Admit: 2013-08-08 | Discharge: 2013-08-08 | Disposition: A | Discharge status: Home / Self Care | Payer: No Typology Code available for payment source | Source: Home / Self Care | Attending: Family Medicine | Admitting: Family Medicine

## 2013-08-08 ENCOUNTER — Encounter (HOSPITAL_COMMUNITY): Payer: Self-pay | Admitting: Emergency Medicine

## 2013-08-08 DIAGNOSIS — IMO0002 Reserved for concepts with insufficient information to code with codable children: Secondary | ICD-10-CM

## 2013-08-08 DIAGNOSIS — S76219A Strain of adductor muscle, fascia and tendon of unspecified thigh, initial encounter: Secondary | ICD-10-CM

## 2013-08-08 MED ORDER — TRAMADOL HCL 50 MG PO TABS: 50.0000 mg | ORAL_TABLET | Freq: Two times a day (BID) | ORAL | Status: DC | PRN

## 2013-08-08 NOTE — ED Notes (Addendum)
Knee pain and swelling. Seen [facility] on [facility]-11 for same, and swelling has gone down . States his knee bends wrong; missed yesterday appointment SM , and had another appointment tomorrow, and thinks he will probably miss it due to weather. He is here today because of pain worsening in his left leg knee and groin. No new injury. NAD

## 2013-08-08 NOTE — ED Provider Notes (Signed)
CSN: [id number]     Arrival date & time 08/08/13  0901 History   First MD Initiated Contact with Patient 08/08/13 [phone]     Chief Complaint  Patient presents with  . Joint Swelling     (Consider location/radiation/quality/duration/timing/severity/associated sxs/prior Treatment) HPI Comments: One day of left groin pain with movement. No known injury. No fever. No pain or swelling in genitalia. No GU or GI symptoms. No rash. Pain exacerbated by flexion of the left hip.   Patient is a 48 y.o. male presenting with groin pain. The history is provided by the patient.  Groin Pain This is a new problem. The current episode started yesterday. The problem has not changed since onset.Associated symptoms comments: none. The symptoms are aggravated by standing and walking (states pain is exacerbated most when he has to transition from lying to sitting position). Nothing relieves the symptoms. Treatments tried: ibuprofen. The treatment provided no relief.    Past Medical History  Diagnosis Date  . Chronic systolic heart failure     Diagnosed 2001; prior patient of Dr. Linard Millers with [name] - discharged from practice  . Dilated cardiomyopathy     LVEF 10-20%  . Hyperlipidemia   . Essential hypertension, benign   . Morbid obesity   . Gout   . 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 Arthritis     knee's  . VT (ventricular tachycardia) 06/07/2011   Past Surgical History  Procedure Laterality Date  . Icd  10/27/2011   Family History  Problem Relation Age of Onset  . Hypertension Mother    History  Substance Use Topics  . Smoking status: Never Smoker   . Smokeless tobacco: Never Used  . Alcohol Use: No    Review of Systems  All other systems reviewed and are negative.      Allergies  Beta adrenergic blockers and Isosorb dinitrate-hydralazine  Home Medications   Current Outpatient Rx  Name  Route  Sig  Dispense  Refill  . acetaminophen (TYLENOL) 500 MG  tablet   Oral   Take 1,000 mg by mouth every 6 (six) hours as needed. For pain         . carvedilol (COREG) 25 MG tablet   Oral   Take 1 tablet (25 mg total) by mouth 2 (two) times daily.   60 tablet   6   . digoxin (LANOXIN) 0.25 MG tablet   Oral   Take 1 tablet (250 mcg total) by mouth daily.   30 tablet   6   . ibuprofen (ADVIL) 200 MG tablet   Oral   Take 4 tablets (800 mg total) by mouth 3 (three) times daily.   30 tablet   0   . lisinopril (PRINIVIL,ZESTRIL) 5 MG tablet   Oral   Take 1 tablet (5 mg total) by mouth daily.   30 tablet   5   . torsemide (DEMADEX) 20 MG tablet   Oral   Take 3 tablets (60 mg total) by mouth daily. MUST HAVE FOLLOW UP APPOINTMENT FOR FURTHER REFILLS   90 tablet   0     HF ASSISTANCE PROGRAM, MUST HAVE FOLLOW UP APPOINT .Marland Kitchen.   . traMADol (ULTRAM) 50 MG tablet   Oral   Take 1 tablet (50 mg total) by mouth every 12 (twelve) hours as needed for moderate pain or severe pain.   15 tablet   0    BP 108/63  Pulse 73  Temp(Src) 97.7 F (36.5  C) (Oral)  Resp 18  SpO2 99% Physical Exam  Nursing note and vitals reviewed. Constitutional: He is oriented to person, place, and time. He appears well-developed and well-nourished. No distress.  +morbid obesity  HENT:  Head: Normocephalic and atraumatic.  Eyes: Conjunctivae are normal.  Cardiovascular: Normal rate.   Pulmonary/Chest: Effort normal.  Abdominal: Soft. Bowel sounds are normal. He exhibits no distension. There is no tenderness.  Musculoskeletal: He exhibits tenderness.       Left hip: He exhibits tenderness. He exhibits normal range of motion, normal strength, no bony tenderness, no swelling, no crepitus, no deformity and no laceration.       Legs: No inguinal hernia appreciated. No tenderness or swelling of genitalia  Neurological: He is alert and oriented to person, place, and time.  Skin: Skin is warm and dry.  Psychiatric: He has a normal mood and affect. His behavior is  normal.    ED Course  Procedures (including critical care time) Labs Review Labs Reviewed - No data to display Imaging Review No results found.    MDM   Final diagnoses:  Strain of groin  Left groin pain of musculoskeletal origin. Patient reports limited relief with ibuprofen. Will provide limited Rx for Ultram and advise follow up at his scheduled orthopedic appointment on 08/13/2013 if symptoms have not improved.     [place], [place] 08/08/13 1014

## 2013-08-09 ENCOUNTER — Ambulatory Visit: Payer: No Typology Code available for payment source | Admitting: Internal Medicine

## 2013-08-10 NOTE — ED Provider Notes (Signed)
Medical screening examination/treatment/procedure(s) were performed by a resident physician or non-physician practitioner and as the supervising physician I was immediately available for consultation/collaboration.  Lynne Leader, MD    Gregor Hams, MD 08/10/13 [phone]

## 2013-08-13 ENCOUNTER — Encounter: Payer: Self-pay | Admitting: Family Medicine

## 2013-08-13 ENCOUNTER — Ambulatory Visit (INDEPENDENT_AMBULATORY_CARE_PROVIDER_SITE_OTHER): Payer: No Typology Code available for payment source | Admitting: Family Medicine

## 2013-08-13 VITALS — BP 123/84 | Ht 70.0 in | Wt 350.0 lb

## 2013-08-13 DIAGNOSIS — M25569 Pain in unspecified knee: Secondary | ICD-10-CM

## 2013-08-13 DIAGNOSIS — M25469 Effusion, unspecified knee: Secondary | ICD-10-CM

## 2013-08-13 DIAGNOSIS — M25461 Effusion, right knee: Secondary | ICD-10-CM

## 2013-08-13 DIAGNOSIS — M25561 Pain in right knee: Secondary | ICD-10-CM

## 2013-08-13 DIAGNOSIS — M25562 Pain in left knee: Secondary | ICD-10-CM

## 2013-08-13 MED ORDER — IBUPROFEN 600 MG PO TABS: 600.0000 mg | ORAL_TABLET | Freq: Three times a day (TID) | ORAL | Status: DC | PRN

## 2013-08-14 ENCOUNTER — Encounter: Payer: Self-pay | Admitting: Family Medicine

## 2013-08-14 NOTE — Progress Notes (Signed)
   Subjective:    Patient ID: William Davila, male    DOB: 1965-10-20, 48 y.o.   MRN: [id number]  HPI Left knee pain and swelling for 5-7 days. No particular inciting injury that he is aware of. He's had intermittent problems with eye lateral knee pain over the last few years. He's occasionally had left knee swelling but never to this extent. Now is swollen, tight, he's having difficulty bearing weight.  PERTINENT  PMH / PSH: No prior history of knee surgery or injury on the right knee In 2013 he had a partial quadriceps rupture of the left quadricep tendon. Morbid obesity CHF History of renal insufficiency with last creatinine in December, was 1.1. He is not on anticoagulation.   Review of Systems Pertinent review of systems: negative for fever or unusual weight change. Denies calf pain.     Objective:   Physical Exam Vital signs are reviewed General: Normally obese male no acute distress KNEE: Left. Significant effusion. Difficulty fully straightening secondary to pain. Knee is warm but not erythematous. Exam is difficult secondary to pain. ULTRASOUND: Large amount of fluid in the suprapatellar pouch. The medial lateral meniscus could not be seen well. These quadricep tendon patellar tendons appear to be intact.  PROCEDURE: Ultrasound guided aspiration of the knee joint. Area prepped and draped in usual sterile fashion. Informed consent was signed copy in the chart. 5 cc 1% lidocaine used for local anasthesia. Using a sterile set up for the ultrasound probe I aspirated 120 cc of clear yellow fluid using a lateral approach. Leaving the 18-gauge needle in place in removing syringe I then placed a 3 cc syringe with 1 cc of 40 mg/mL methylprednisolone injected that into the knee joint space. All equipment  was removed. Band-Aids applied. Patient tolerated procedure well without any problems. He was given postop instructions, watch out for increasing warmth, erythema, drainage. Fever.      Assessment & Plan:  Large knee effusion on the left. We aspirated significant amount of fluid today. Injection of corticosteroid. I suspect he is going to have continued effusion. We'll see him back in one week for followup. Ultimately he may need MRI to evaluate internal arranged at the knee. Morbid obesity is complicated her. I do not think this is related to his past medical history of gout. We will allow him to use 6 or milligrams ibuprofen Q8 the next 5-7 days, conscious the fact that he has CHF and has had chronic renal sufficiency the past on the right and his creatinine is good.

## 2013-08-17 ENCOUNTER — Ambulatory Visit (INDEPENDENT_AMBULATORY_CARE_PROVIDER_SITE_OTHER): Payer: No Typology Code available for payment source | Admitting: *Deleted

## 2013-08-17 DIAGNOSIS — I5022 Chronic systolic (congestive) heart failure: Secondary | ICD-10-CM

## 2013-08-17 DIAGNOSIS — I472 Ventricular tachycardia, unspecified: Secondary | ICD-10-CM

## 2013-08-17 DIAGNOSIS — I428 Other cardiomyopathies: Secondary | ICD-10-CM

## 2013-08-17 DIAGNOSIS — I4729 Other ventricular tachycardia: Secondary | ICD-10-CM

## 2013-08-20 ENCOUNTER — Ambulatory Visit (INDEPENDENT_AMBULATORY_CARE_PROVIDER_SITE_OTHER): Payer: No Typology Code available for payment source | Admitting: Family Medicine

## 2013-08-20 ENCOUNTER — Encounter: Payer: Self-pay | Admitting: Family Medicine

## 2013-08-20 VITALS — BP 121/77 | Ht 70.0 in | Wt 350.0 lb

## 2013-08-20 DIAGNOSIS — M25561 Pain in right knee: Secondary | ICD-10-CM

## 2013-08-20 DIAGNOSIS — M25569 Pain in unspecified knee: Secondary | ICD-10-CM

## 2013-08-20 NOTE — Progress Notes (Signed)
   Subjective:    Patient ID: William Davila, male    DOB: 14-Jul-1965, 48 y.o.   MRN: [id number]  HPI  Followup left knee effusion. Last office visit we drained it and injected him with corticosteroid. The pain is significantly better. He still has problems with some swelling in the knee but that's also much improved. His main complaint now is of a feeling of instability. He has continued using his crutches.  Review of Systems No fever, sweats, chills.    Objective:   Physical Exam  Vital signs are reviewed GENERAL: Well-developed overweight male no acute distress KNEE: Left. Still slight effusion but about 95% improved from last office visit. The calf is soft. Popliteal space is benign. Distally he is neurovascularly intact. He is tender to palpation along the anterior portion in the medial joint line of his knee. He has some pain with full extension but essentially has full flexion and extension.      Assessment & Plan:  Knee effusion much improved. I suspect he has underlying meniscal issues. These are the presence of a defibrillator, we're unable to get an MRI. He also has no insurance so getting an arthroscope would be somewhat difficult. We discussed and opted to have him followup in one month. He still having instability I would potentially give him a second corticosteroid injection but if he is significantly improved from that aspect I would do nothing different.

## 2013-08-21 ENCOUNTER — Encounter: Payer: Self-pay | Admitting: Internal Medicine

## 2013-08-21 LAB — MDC_IDC_ENUM_SESS_TYPE_INCLINIC
HighPow Impedance: 59 Ohm
HighPow Impedance: 72 Ohm
Lead Channel Impedance Value: 437 Ohm
Lead Channel Pacing Threshold Amplitude: 1.25 V
Lead Channel Pacing Threshold Pulse Width: 0.4 ms
Lead Channel Sensing Intrinsic Amplitude: 13.4 mV
Lead Channel Setting Sensing Sensitivity: 0.3 mV
MDC IDC MSMT BATTERY VOLTAGE: 3.16 V
MDC IDC SET LEADCHNL RV PACING AMPLITUDE: 2.5 V
MDC IDC SET LEADCHNL RV PACING PULSEWIDTH: 0.4 ms
MDC IDC SET ZONE DETECTION INTERVAL: 300 ms
MDC IDC SET ZONE DETECTION INTERVAL: 350 ms
MDC IDC STAT BRADY RV PERCENT PACED: 0 %
Zone Setting Detection Interval: 250 ms
Zone Setting Detection Interval: 350 ms

## 2013-08-21 NOTE — Progress Notes (Signed)
ICD check in clinic. Normal device function. Threshold and sensing consistent with previous device measurements. Impedance trends stable over time. 6 NSVT episodes---max dur. 16 bts, Max V 244. Histogram distribution appropriate for patient and level of activity. Abn thoracic impedance from 2/17-3/2---patient denies s/o SOB, edema, bloating. No changes made this session. Device programmed at appropriate safety margins. Device programmed to optimize intrinsic conduction. Batt voltage 3.16V (ERI 2.63V). Pt enrolled in remote follow-up. Plan to follow up via [facility] on 6-9 and with [facility] in 05-2014. Patient education completed including shock plan. Alert tones demonstrated for patient.

## 2013-09-10 ENCOUNTER — Ambulatory Visit: Payer: No Typology Code available for payment source | Admitting: Family Medicine

## 2013-10-08 ENCOUNTER — Ambulatory Visit (HOSPITAL_COMMUNITY)
Admission: RE | Admit: 2013-10-08 | Discharge: 2013-10-08 | Disposition: A | Discharge status: Home / Self Care | Payer: No Typology Code available for payment source | Source: Ambulatory Visit | Attending: Internal Medicine | Admitting: Internal Medicine

## 2013-10-08 VITALS — BP 115/69 | HR 92 | Wt 357.0 lb

## 2013-10-08 DIAGNOSIS — I5022 Chronic systolic (congestive) heart failure: Secondary | ICD-10-CM | POA: Insufficient documentation

## 2013-10-08 MED ORDER — TORSEMIDE 20 MG PO TABS: 60.0000 mg | ORAL_TABLET | Freq: Every day | ORAL | Status: DC

## 2013-10-08 NOTE — Progress Notes (Signed)
Patient ID: William Davila, male   DOB: 1965-12-06, 48 y.o.   MRN: [id number] Ortho: Dr Nori Riis  PCP: Dr Mechele Claude.  EP: Dr Caryl Comes  HPI: William Davila  is a 48 year old AA male with systolic heart failure (WU98-11% March 2013 ), cardiolmyopathy diagnosed 2001, hyperlipidemia, HTN, and obesity. S/P ST single chamber ICD 10/2011. Intolerant Bidil due to headaches.    Discharged from [facility] 05/10/2011 Discharge weight 328.  Massive volume over load and low output. Cardiac output increased inotropes. Medications initiated ace, beta blocker , digoxin, and torsemide.   He is returns for follow up. Denies SOB/PND/Orthopnea.  No shocks.  He has difficulty with steps due to bilateral knee pain. Weight at home 350-357 pounds. Complaint with medications.Lives with grandfather.     ECHO 08/2011 EF 20-25%  Labs 05/15/14 Dig level 0.9 K 4.1 Creatinine 1.1   ROS: All systems negative except as listed in HPI, PMH and Problem List.  Past Medical History  Diagnosis Date  . Chronic systolic heart failure     Diagnosed 2001; prior patient of Dr. Linard Millers with [name] - discharged from practice  . Dilated cardiomyopathy     LVEF 10-20%  . Hyperlipidemia   . Essential hypertension, benign   . Morbid obesity   . Gout   . 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 Arthritis     knee's  . VT (ventricular tachycardia) 06/07/2011    Current Outpatient Prescriptions  Medication Sig Dispense Refill  . acetaminophen (TYLENOL) 500 MG tablet Take 1,000 mg by mouth every 6 (six) hours as needed. For pain      . carvedilol (COREG) 25 MG tablet Take 1 tablet (25 mg total) by mouth 2 (two) times daily.  60 tablet  6  . digoxin (LANOXIN) 0.25 MG tablet Take 1 tablet (250 mcg total) by mouth daily.  30 tablet  6  . ibuprofen (ADVIL,MOTRIN) 600 MG tablet Take 1 tablet (600 mg total) by mouth every 8 (eight) hours as needed.  45 tablet  1  . lisinopril (PRINIVIL,ZESTRIL) 5 MG tablet Take 1 tablet (5 mg total) by  mouth daily.  30 tablet  5  . torsemide (DEMADEX) 20 MG tablet Take 3 tablets (60 mg total) by mouth daily. MUST HAVE FOLLOW UP APPOINTMENT FOR FURTHER REFILLS  90 tablet  0   No current facility-administered medications for this encounter.     PHYSICAL EXAM: Filed Vitals:   10/08/13 1333  BP: 115/69  Pulse: 92   Weight change:  General:  Well appearing. No resp difficulty HEENT: normal Neck: supple. JVP 5-6. Carotids 2+ bilaterally; no bruits. No lymphadenopathy or thryomegaly appreciated. Cor: PMI normal. Regular rate & rhythm. No rubs, S3  or murmurs. Lungs: clear Abdomen: obese, soft, nontender, nondistended. No hepatosplenomegaly. No bruits or masses. Good bowel sounds. Extremities: no cyanosis, clubbing, rash, edema Neuro: alert & orientedx3, cranial nerves grossly intact. Moves all 4 extremities w/o difficulty. Affect pleasant.      ASSESSMENT & PLAN:  1. Chronic Systolic heart Failure- S/P ST Jude ICD 10/2011  NYHA II. Volume status stable. Continue torsemide 60 mg daily On goal carvedilol 25 mg twice a day. Continue digoxin 0.25 mg daily (dig level 0.9 05/2013)  Continue 2.5 mg daily.  Reinforced daily weights, low salt food choices, and limiting fluid intake to < 2 liters per day.  Follow up in 6 months with ECHO.  2. Obesity-  Encourged to cut back on portions.  Follow up in 6 months   Aleecia Tapia D Omaya Nieland NP-C  3:22 PM

## 2013-10-08 NOTE — Patient Instructions (Signed)
Follow up in 6 months with an ECHO  Do the following things EVERYDAY: 1) Weigh yourself in the morning before breakfast. Write it down and keep it in a log. 2) Take your medicines as prescribed 3) Eat low salt foods-Limit salt (sodium) to 2000 mg per day.  4) Stay as active as you can everyday 5) Limit all fluids for the day to less than 2 liters   

## 2013-10-22 ENCOUNTER — Ambulatory Visit: Payer: No Typology Code available for payment source

## 2013-11-08 ENCOUNTER — Other Ambulatory Visit: Payer: Self-pay | Admitting: *Deleted

## 2013-11-08 DIAGNOSIS — I5022 Chronic systolic (congestive) heart failure: Secondary | ICD-10-CM

## 2013-11-08 MED ORDER — LISINOPRIL 5 MG PO TABS: 5.0000 mg | ORAL_TABLET | Freq: Every day | ORAL | Status: DC

## 2013-11-09 ENCOUNTER — Telehealth: Payer: Self-pay | Admitting: *Deleted

## 2013-11-09 NOTE — Telephone Encounter (Signed)
RX FOR LISINOPRIL CALLED INTO [facility].#30 WITH 5 ADDITIONAL REFILLS.

## 2013-11-15 ENCOUNTER — Telehealth: Payer: Self-pay | Admitting: *Deleted

## 2013-11-15 NOTE — Telephone Encounter (Signed)
Pt called to say he has not had his cell adapter application processed yet. He states he filled out an application, sent it to [facility], then called [facility] to follow up.  [facility] stated they never received his application.  [facility] sent pt another application. Pt understands he needs to fill it out immediately upon receiving.  Pt has been asymptomatic since 08/17/13 visit. Pt understands if his adapter does not arrive soon, we will check him in the clinic.

## 2013-11-19 ENCOUNTER — Telehealth: Payer: Self-pay | Admitting: Internal Medicine

## 2013-11-19 NOTE — Telephone Encounter (Signed)
New message      Please put in the computer patients new pharmacy is [facility] on [address] starting today

## 2013-12-19 ENCOUNTER — Other Ambulatory Visit (HOSPITAL_COMMUNITY): Payer: Self-pay | Admitting: Internal Medicine

## 2013-12-24 ENCOUNTER — Ambulatory Visit (INDEPENDENT_AMBULATORY_CARE_PROVIDER_SITE_OTHER): Payer: Medicare Other | Admitting: *Deleted

## 2013-12-24 DIAGNOSIS — I428 Other cardiomyopathies: Secondary | ICD-10-CM

## 2013-12-24 DIAGNOSIS — I472 Ventricular tachycardia, unspecified: Secondary | ICD-10-CM

## 2013-12-24 DIAGNOSIS — I5022 Chronic systolic (congestive) heart failure: Secondary | ICD-10-CM

## 2013-12-24 DIAGNOSIS — I4729 Other ventricular tachycardia: Secondary | ICD-10-CM

## 2013-12-24 LAB — MDC_IDC_ENUM_SESS_TYPE_REMOTE
Brady Statistic AS VP Percent: 0 %
Brady Statistic AS VS Percent: 100 %
Brady Statistic RA Percent Paced: 0 %
Brady Statistic RV Percent Paced: 0 %
Date Time Interrogation Session: 20150713135230
HIGH POWER IMPEDANCE MEASURED VALUE: 247 Ohm
HIGH POWER IMPEDANCE MEASURED VALUE: 304 Ohm
HIGH POWER IMPEDANCE MEASURED VALUE: 49 Ohm
HIGH POWER IMPEDANCE MEASURED VALUE: 63 Ohm
Lead Channel Impedance Value: 380 Ohm
Lead Channel Impedance Value: 4047 Ohm
Lead Channel Impedance Value: 4047 Ohm
Lead Channel Pacing Threshold Amplitude: 1.5 V
Lead Channel Pacing Threshold Pulse Width: 0.4 ms
Lead Channel Sensing Intrinsic Amplitude: 0.375 mV
Lead Channel Sensing Intrinsic Amplitude: 8.125 mV
Lead Channel Setting Pacing Amplitude: 3.25 V
MDC IDC MSMT BATTERY VOLTAGE: 3.14 V
MDC IDC MSMT LEADCHNL LV IMPEDANCE VALUE: 4047 Ohm
MDC IDC MSMT LEADCHNL RA IMPEDANCE VALUE: 4047 Ohm
MDC IDC MSMT LEADCHNL RV SENSING INTR AMPL: 8.125 mV
MDC IDC SET LEADCHNL RV PACING PULSEWIDTH: 0.4 ms
MDC IDC SET LEADCHNL RV SENSING SENSITIVITY: 0.3 mV
MDC IDC SET ZONE DETECTION INTERVAL: 250 ms
MDC IDC SET ZONE DETECTION INTERVAL: 350 ms
MDC IDC STAT BRADY AP VP PERCENT: 0 %
MDC IDC STAT BRADY AP VS PERCENT: 0 %
Zone Setting Detection Interval: 300 ms
Zone Setting Detection Interval: 350 ms

## 2013-12-25 NOTE — Progress Notes (Signed)
Remote ICD transmission.   

## 2014-01-16 ENCOUNTER — Encounter: Payer: Self-pay | Admitting: Cardiology

## 2014-01-21 ENCOUNTER — Encounter: Payer: Self-pay | Admitting: Internal Medicine

## 2014-02-05 ENCOUNTER — Other Ambulatory Visit (HOSPITAL_COMMUNITY): Payer: Self-pay | Admitting: Anesthesiology

## 2014-02-05 DIAGNOSIS — I5022 Chronic systolic (congestive) heart failure: Secondary | ICD-10-CM

## 2014-02-19 ENCOUNTER — Telehealth: Payer: Self-pay | Admitting: Internal Medicine

## 2014-02-19 NOTE — Telephone Encounter (Signed)
New message     Patient calling need to transfer his medication   Carvedilol  2.5 mg [facility] on [address].

## 2014-02-20 ENCOUNTER — Other Ambulatory Visit: Payer: Self-pay

## 2014-02-20 ENCOUNTER — Other Ambulatory Visit: Payer: Self-pay | Admitting: *Deleted

## 2014-02-20 DIAGNOSIS — I5022 Chronic systolic (congestive) heart failure: Secondary | ICD-10-CM

## 2014-02-20 MED ORDER — CARVEDILOL 25 MG PO TABS: 25.0000 mg | ORAL_TABLET | Freq: Two times a day (BID) | ORAL | Status: DC

## 2014-02-25 ENCOUNTER — Encounter: Payer: Self-pay | Admitting: Internal Medicine

## 2014-02-25 ENCOUNTER — Ambulatory Visit (INDEPENDENT_AMBULATORY_CARE_PROVIDER_SITE_OTHER): Payer: Medicare Other | Admitting: Internal Medicine

## 2014-02-25 VITALS — BP 144/97 | HR 95 | Temp 98.4°F | Ht 69.0 in | Wt 383.9 lb

## 2014-02-25 DIAGNOSIS — M10032 Idiopathic gout, left wrist: Secondary | ICD-10-CM

## 2014-02-25 DIAGNOSIS — M109 Gout, unspecified: Secondary | ICD-10-CM

## 2014-02-25 MED ORDER — COLCHICINE 0.6 MG PO TABS: 1.2000 mg | ORAL_TABLET | Freq: Every day | ORAL | Status: DC

## 2014-02-25 NOTE — Patient Instructions (Signed)
General Instructions: Take two tablets to start and then one 0.6mg  tablet 1 hour later. Then stop.  If no improvement in 5 days or swelling is getting worse, come back in to see [place].   Please follow up with your heart doctor.   Please bring your medicines with you each time you come to clinic.  Medicines may include prescription medications, over-the-counter medications, herbal remedies, eye drops, vitamins, or other pills.

## 2014-02-25 NOTE — Progress Notes (Signed)
Patient ID: VA BROADWELL, male   DOB: 04-May-1966, 48 y.o.   MRN: [id number]   Subjective:   Patient ID: William Davila male   DOB: [birth year] 48 y.o.   MRN: [id number]  HPI: Mr.Forbes L Jansma is a 48 y.o. with PMH of HTN, Systolic CHF with ICD placement in 2013, dil CMP, HLD, Gout, Obesity presented today with complaints of left wrist swelling. Pt has a hx of similar episode in his right wrist, in the past, but has had none in the past 2 years. Swelling started on Friday- ~3 days prior, with gradually increasing severity of pain and swelling, Swelling progresses to involve his fingers distally and fore arm proximally. No hx of trauma, or open cuts, no fever, no swelling of any other joint. Pt has taken Tylenol arthritis only with minimal relief of his symptoms.  Past Medical History  Diagnosis Date  . Chronic systolic heart failure     Diagnosed 2001; prior patient of Dr. Linard Millers with [name] - discharged from practice  . Dilated cardiomyopathy     LVEF 10-20%  . Hyperlipidemia   . Essential hypertension, benign   . Morbid obesity   . Gout   . 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 Arthritis     knee's  . VT (ventricular tachycardia) 06/07/2011   Current Outpatient Prescriptions  Medication Sig Dispense Refill  . acetaminophen (TYLENOL) 500 MG tablet Take 1,000 mg by mouth every 6 (six) hours as needed. For pain      . carvedilol (COREG) 25 MG tablet Take 1 tablet (25 mg total) by mouth 2 (two) times daily.  60 tablet  0  . digoxin (LANOXIN) 0.25 MG tablet Take 1 tablet (250 mcg total) by mouth daily.  30 tablet  6  . digoxin (LANOXIN) 0.25 MG tablet TAKE ONE TABLET BY MOUTH EVERY DAY  30 tablet  3  . ibuprofen (ADVIL,MOTRIN) 600 MG tablet Take 1 tablet (600 mg total) by mouth every 8 (eight) hours as needed.  45 tablet  1  . lisinopril (PRINIVIL,ZESTRIL) 5 MG tablet Take 1 tablet (5 mg total) by mouth daily.  30 tablet  5  . torsemide (DEMADEX) 20 MG tablet Take  3 tablets (60 mg total) by mouth daily.  90 tablet  6   No current facility-administered medications for this visit.   Family History  Problem Relation Age of Onset  . Hypertension Mother    History   Social History  . Marital Status: Single    Spouse Name: N/A    Number of Children: N/A  . Years of Education: N/A   Social History Main Topics  . Smoking status: Never Smoker   . Smokeless tobacco: Never Used  . Alcohol Use: No  . Drug Use: No  . Sexual Activity: Yes   Other Topics Concern  . None   Social History Narrative  . None   Review of Systems: CONSTITUTIONAL- No Fever, weightloss, night sweat or change in appetite. SKIN- No Rash, colour changes or itching. HEAD- No Headache or dizziness. RESPIRATORY- No Cough or SOB. CARDIAC- No Palpitations, DOE, PND or chest pain, doesnt think his ICD has ever discharged.[facility]- No nausea, vomiting, diarrhoea, constipation, abd pain. URINARY- No Frequency, urgency, straining or dysuria. NEUROLOGIC- No Numbness, syncope, seizures or burning. [facility]- Denies depression or anxiety.  Objective:  Physical Exam: Filed Vitals:   02/25/14 1517  BP: 144/97  Pulse: 95  Temp: 98.4 F (36.9 C)  TempSrc:  Oral  Height: 5\' 9"  (1.753 m)  Weight: 383 lb 14.4 oz (174.136 kg)  SpO2: 100%   GENERAL- alert, co-operative, obese, pleasant gentleman, appears as stated age, not in any distress. HEENT- Atraumatic, normocephalic, PERRL, EOMI, oral mucosa appears moist, neck supple. CARDIAC- RRR, 3/6 systolic murmur, no rubs or gallops. RESP- Moving equal volumes of air, and clear to auscultation bilaterally, no wheezes or crackles. ABDOMEN- Soft, nontender, no guarding or rebound, no palpable masses or organomegaly, bowel sounds present. BACK- Normal curvature of the spine, No tenderness along the vertebrae, no CVA tenderness. NEURO- No obvious Cr N abnormality, strenght upper and lower extremities- intact, Gait- Normal. EXTREMITIES- pulse 2+,  symmetric, trace non pitting pedal edema, Left wrist- Swelling extending distally to fingers and proximally to lower third of forearm, no erythema or warmth, no fluctuance, range of motion limited by swelling and pain, Right wrist- normal in appearance and color. SKIN- Warm, dry, No rash or lesion. PSYCH- Normal mood and affect, appropriate thought content and speech.  Assessment & Plan:   The patient's case and plan of care was discussed with attending physician, Dr. Daryll Drown.  Please see problem based charting for assessment and plan.

## 2014-02-26 NOTE — Assessment & Plan Note (Signed)
No flares in  2 years. Not on gout prophylaxis- last Urica acid check- 2012- 11.6. Present flare involving his left wrist- 3 days duration. Swelling and pain. Has not tried NSAIDS.  Plan- Colchicine- 1.2mg  start and then 0.6 mg 1hr later.  - Follow up uric acid levels at next visit. Might benefit from uric acid lowering therapy- allorpurinol. - No imaging today, but if unresolved, or getting worse, pt counselled to come back to clinic, consider Xrays, refferal to sports meds for possible joint aspiration,to confirm diagnosis.

## 2014-02-27 NOTE — Progress Notes (Signed)
Internal Medicine Clinic Attending Date of visit: 02/25/2014   Case discussed with Dr. Denton Brick soon after the resident saw the patient.  We reviewed the resident's history and exam and pertinent patient test results.  I agree with the assessment, diagnosis, and plan of care documented in the resident's note.

## 2014-03-27 ENCOUNTER — Ambulatory Visit (INDEPENDENT_AMBULATORY_CARE_PROVIDER_SITE_OTHER): Payer: Medicare Other | Admitting: *Deleted

## 2014-03-27 ENCOUNTER — Encounter: Payer: Self-pay | Admitting: Internal Medicine

## 2014-03-27 DIAGNOSIS — I472 Ventricular tachycardia, unspecified: Secondary | ICD-10-CM

## 2014-03-27 DIAGNOSIS — I5022 Chronic systolic (congestive) heart failure: Secondary | ICD-10-CM

## 2014-03-27 NOTE — Progress Notes (Signed)
Remote ICD transmission.   

## 2014-03-29 LAB — MDC_IDC_ENUM_SESS_TYPE_REMOTE
Brady Statistic AS VS Percent: 100 %
HighPow Impedance: 50 Ohm
Lead Channel Impedance Value: 437 Ohm
Lead Channel Sensing Intrinsic Amplitude: 0.5 mV
Lead Channel Sensing Intrinsic Amplitude: 11.8 mV
MDC IDC MSMT LEADCHNL RV PACING THRESHOLD AMPLITUDE: 1.125 V
MDC IDC MSMT LEADCHNL RV PACING THRESHOLD PULSEWIDTH: 0.4 ms

## 2014-04-04 ENCOUNTER — Encounter: Payer: Self-pay | Admitting: Internal Medicine

## 2014-04-04 ENCOUNTER — Ambulatory Visit (INDEPENDENT_AMBULATORY_CARE_PROVIDER_SITE_OTHER): Payer: Medicare Other | Admitting: Internal Medicine

## 2014-04-04 VITALS — BP 126/71 | HR 94 | Temp 98.6°F | Resp 20 | Ht 69.5 in | Wt 386.5 lb

## 2014-04-04 DIAGNOSIS — R7989 Other specified abnormal findings of blood chemistry: Secondary | ICD-10-CM

## 2014-04-04 DIAGNOSIS — I10 Essential (primary) hypertension: Secondary | ICD-10-CM

## 2014-04-04 DIAGNOSIS — S39012A Strain of muscle, fascia and tendon of lower back, initial encounter: Secondary | ICD-10-CM

## 2014-04-04 DIAGNOSIS — I5022 Chronic systolic (congestive) heart failure: Secondary | ICD-10-CM

## 2014-04-04 DIAGNOSIS — E790 Hyperuricemia without signs of inflammatory arthritis and tophaceous disease: Secondary | ICD-10-CM

## 2014-04-04 MED ORDER — IBUPROFEN 600 MG PO TABS: 600.0000 mg | ORAL_TABLET | Freq: Two times a day (BID) | ORAL | Status: DC | PRN

## 2014-04-04 NOTE — Patient Instructions (Signed)
General Instructions:  Please bring your medicines with you each time you come to clinic.  Medicines may include prescription medications, over-the-counter medications, herbal remedies, eye drops, vitamins, or other pills.  Try ibuprofen for 10 day trial as prescribed and back stretches like we discussed for relief.   Please reconsider the flu shot Progress Toward Treatment Goals:  Treatment Goal 04/04/2014  Blood pressure at goal    Self Care Goals & Plans:  Self Care Goal 04/04/2014  Manage my medications take my medicines as prescribed; refill my medications on time; bring my medications to every visit  Eat healthy foods (No Data)  Be physically active find an activity I enjoy    No flowsheet data found.   Care Management & Community Referrals:  No flowsheet data found.    Lumbosacral Strain Lumbosacral strain is a strain of any of the parts that make up your lumbosacral vertebrae. Your lumbosacral vertebrae are the bones that make up the lower third of your backbone. Your lumbosacral vertebrae are held together by muscles and tough, fibrous tissue (ligaments).  CAUSES  A sudden blow to your back can cause lumbosacral strain. Also, anything that causes an excessive stretch of the muscles in the low back can cause this strain. This is typically seen when people exert themselves strenuously, fall, lift heavy objects, bend, or crouch repeatedly. RISK FACTORS  Physically demanding work.  Participation in pushing or pulling sports or sports that require a sudden twist of the back (tennis, golf, baseball).  Weight lifting.  Excessive lower back curvature.  Forward-tilted pelvis.  Weak back or abdominal muscles or both.  Tight hamstrings. SIGNS AND SYMPTOMS  Lumbosacral strain may cause pain in the area of your injury or pain that moves (radiates) down your leg.  DIAGNOSIS Your health care provider can often diagnose lumbosacral strain through a physical exam. In some  cases, you may need tests such as X-ray exams.  TREATMENT  Treatment for your lower back injury depends on many factors that your clinician will have to evaluate. However, most treatment will include the use of anti-inflammatory medicines. HOME CARE INSTRUCTIONS   Avoid hard physical activities (tennis, racquetball, waterskiing) if you are not in proper physical condition for it. This may aggravate or create problems.  If you have a back problem, avoid sports requiring sudden body movements. Swimming and walking are generally safer activities.  Maintain good posture.  Maintain a healthy weight.  For acute conditions, you may put ice on the injured area.  Put ice in a plastic bag.  Place a towel between your skin and the bag.  Leave the ice on for 20 minutes, 2-3 times a day.  When the low back starts healing, stretching and strengthening exercises may be recommended. SEEK MEDICAL CARE IF:  Your back pain is getting worse.  You experience severe back pain not relieved with medicines. SEEK IMMEDIATE MEDICAL CARE IF:   You have numbness, tingling, weakness, or problems with the use of your arms or legs.  There is a change in bowel or bladder control.  You have increasing pain in any area of the body, including your belly (abdomen).  You notice shortness of breath, dizziness, or feel faint.  You feel sick to your stomach (nauseous), are throwing up (vomiting), or become sweaty.  You notice discoloration of your toes or legs, or your feet get very cold. MAKE SURE YOU:   Understand these instructions.  Will watch your condition.  Will get help right away if  you are not doing well or get worse. Document Released: 03/10/2005 Document Revised: 06/05/2013 Document Reviewed: 01/17/2013 [facility] Patient Information 2015 [place], [place]. This information is not intended to replace advice given to you by your health care provider. Make sure you discuss any questions you have with  your health care provider.

## 2014-04-05 LAB — BASIC METABOLIC PANEL WITH GFR
BUN: 15 mg/dL (ref 6–23)
CALCIUM: 9.3 mg/dL (ref 8.4–10.5)
CO2: 29 mEq/L (ref 19–32)
CREATININE: 1.1 mg/dL (ref 0.50–1.35)
Chloride: 99 mEq/L (ref 96–112)
GFR, EST NON AFRICAN AMERICAN: 80 mL/min
GFR, Est African American: >89 mL/min
Glucose, Bld: 124 mg/dL — ABNORMAL HIGH (ref 70–99)
Potassium: 3.5 mEq/L (ref 3.5–5.3)
Sodium: 138 mEq/L (ref 135–145)

## 2014-04-05 LAB — URIC ACID: URIC ACID, SERUM: 13 mg/dL — AB (ref 4.0–7.8)

## 2014-04-07 DIAGNOSIS — S39012A Strain of muscle, fascia and tendon of lower back, initial encounter: Secondary | ICD-10-CM | POA: Insufficient documentation

## 2014-04-07 NOTE — Assessment & Plan Note (Signed)
BP Readings from Last 3 Encounters:  04/04/14 126/71  02/25/14 144/97  10/08/13 115/69   Lab Results  Component Value Date   NA 138 04/04/2014   K 3.5 04/04/2014   CREATININE 1.10 04/04/2014    Assessment: Blood pressure control: controlled Progress toward BP goal:  at goal did not bring mediations today  Plan: Medications:  continue current medications coreg 25mg  bid, dig 258mcg daily, lisinopril 5mg  daily, and torsemide 60mg  daily Other plans: plans to follow up with cards this month

## 2014-04-07 NOTE — Assessment & Plan Note (Signed)
Resolved attack of left wrist with colchicine.   Uric acid remains elevated from 11.6 to 13 today -will discuss starting allopurinol with him, 100mg  daily

## 2014-04-07 NOTE — Assessment & Plan Note (Addendum)
bmet today, or torsemide, BB, ACEi, and dig.   Not on statin, no recent lipid panel  -consider lipid panel check next visit and likely starting statin (noted low HDL) in the past

## 2014-04-07 NOTE — Assessment & Plan Note (Addendum)
Continues to increase. Now 385 pounds today. Stressed importance of weight loss and exercise today.   -will discuss silver sneakers and/or nutritionist visit with him next time

## 2014-04-07 NOTE — Assessment & Plan Note (Addendum)
Acute, past 2 days, soreness with tenderness to palpation in r lower back region. No radiation of pain down to legs. No obvious trauma to area. Likely strain and in setting of obesity.   -weight loss advised -supportive measures for now, back stretches at home, try to walk and exercise as tolerated, can try heat to area, and has tolerated ibuprofen well in the past -ibuprofen 600mg  bid x10 day trial -advised to return if back pain worsening or not improved

## 2014-04-07 NOTE — Progress Notes (Signed)
Subjective:   Patient ID: William Davila male   DOB: 27-Sep-1965 48 y.o.   MRN: [id number]  HPI: [name] is a 48 y.o. male with PMH as listed below presenting to opc today for routine follow up visit after gout of left wrist.   Gout--left wrist last month, since then resolved after using colchicine. Will check uric acid today (last one 11.6 in 2012) and consider starting allopurinol.   HTN--well controlled  Lower back pain--right sided, acute x2 days. ROM in tact, has not tried anything for the pain, no radiation down extremities. No obvious trauma to area or similar pain in the past. Constant soreness sensation.   Obesity--continues to gain weight. Discussed diet and exercise today and need for weight loss. Could consider silver sneakers if he is willing or even meeting with nutritionist.   Refused flu vaccine today Past Medical History  Diagnosis Date  . Chronic systolic heart failure     Diagnosed 2001; prior patient of Dr. Linard Millers with [name] - discharged from practice  . Dilated cardiomyopathy     LVEF 10-20%  . Hyperlipidemia   . Essential hypertension, benign   . Morbid obesity   . Gout   . 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 Arthritis     knee's  . VT (ventricular tachycardia) 06/07/2011   Current Outpatient Prescriptions  Medication Sig Dispense Refill  . acetaminophen (TYLENOL) 500 MG tablet Take 1,000 mg by mouth every 6 (six) hours as needed. For pain      . carvedilol (COREG) 25 MG tablet Take 1 tablet (25 mg total) by mouth 2 (two) times daily.  60 tablet  0  . digoxin (LANOXIN) 0.25 MG tablet Take 1 tablet (250 mcg total) by mouth daily.  30 tablet  6  . lisinopril (PRINIVIL,ZESTRIL) 5 MG tablet Take 1 tablet (5 mg total) by mouth daily.  30 tablet  5  . torsemide (DEMADEX) 20 MG tablet Take 3 tablets (60 mg total) by mouth daily.  90 tablet  6  . colchicine 0.6 MG tablet Take 2 tablets (1.2 mg total) by mouth daily. 1.2 mg  start then 0.6mg  1 hour later.  5 tablet  0  . ibuprofen (ADVIL,MOTRIN) 600 MG tablet Take 1 tablet (600 mg total) by mouth 2 (two) times daily as needed for moderate pain.  20 tablet  0   No current facility-administered medications for this visit.   Family History  Problem Relation Age of Onset  . Hypertension Mother    History   Social History  . Marital Status: Single    Spouse Name: N/A    Number of Children: N/A  . Years of Education: N/A   Social History Main Topics  . Smoking status: Never Smoker   . Smokeless tobacco: Never Used  . Alcohol Use: No  . Drug Use: No  . Sexual Activity: Yes   Other Topics Concern  . None   Social History Narrative  . None   Review of Systems:  Constitutional:  Denies fever, chills  Respiratory:  Denies SOB   Cardiovascular:  Leg swelling.   Gastrointestinal:  Denies nausea, vomiting, abdominal pain  Genitourinary:  Denies dysuria  Musculoskeletal:  Back pain  Skin:  Denies pallor, rash and wound.   Neurological:  Denies syncope    Objective:  Physical Exam: Filed Vitals:   04/04/14 1452  BP: 126/71  Pulse: 94  Temp: 98.6 F (37 C)  TempSrc: Oral  Resp: 20  Height: 5' 9.5" (1.765 m)  Weight: 386 lb 8 oz (175.315 kg)  SpO2: 99%   Vitals reviewed. General: sitting in chair, NAD HEENT: EOMI Cardiac: RRR Pulm: clear to auscultation bilaterally Abd: soft, obese, BS present Ext: lower extremity edema, +tenderness to palpation lower back more on R side, ROM in tact but pain with extension and flexion, lateral rotation okay. Able to lift extremities without pain.  Neuro: alert and oriented X3, strength equal in bilateral upper and lower extremities  Assessment & Plan:  Discussed with Dr. Lynnae January

## 2014-04-08 NOTE — Progress Notes (Signed)
Internal Medicine Clinic Attending  Case discussed with Dr. Qureshi soon after the resident saw the patient.  We reviewed the resident's history and exam and pertinent patient test results.  I agree with the assessment, diagnosis, and plan of care documented in the resident's note. 

## 2014-04-17 ENCOUNTER — Other Ambulatory Visit (HOSPITAL_COMMUNITY): Payer: Self-pay | Admitting: Cardiology

## 2014-04-17 ENCOUNTER — Other Ambulatory Visit: Payer: Self-pay | Admitting: Internal Medicine

## 2014-04-17 ENCOUNTER — Encounter: Payer: Self-pay | Admitting: Cardiology

## 2014-04-17 DIAGNOSIS — I5022 Chronic systolic (congestive) heart failure: Secondary | ICD-10-CM

## 2014-04-17 MED ORDER — DIGOXIN 250 MCG PO TABS: 250.0000 ug | ORAL_TABLET | Freq: Every day | ORAL | Status: DC

## 2014-04-17 MED ORDER — CARVEDILOL 25 MG PO TABS: 25.0000 mg | ORAL_TABLET | Freq: Two times a day (BID) | ORAL | Status: DC

## 2014-04-30 ENCOUNTER — Other Ambulatory Visit: Payer: Self-pay | Admitting: *Deleted

## 2014-04-30 MED ORDER — NAPROXEN 500 MG PO TABS: 500.0000 mg | ORAL_TABLET | Freq: Two times a day (BID) | ORAL | Status: DC

## 2014-04-30 NOTE — Telephone Encounter (Signed)
appt scheduled per dr Eula Fried for wed 11/18 at [facility] Ethelene Hal

## 2014-04-30 NOTE — Telephone Encounter (Signed)
Pt states he is having a flare of gout in his R foot at present, appr 2 days, states the colchicine helped last time

## 2014-04-30 NOTE — Telephone Encounter (Addendum)
I called and spoke with William Davila today. Will start naproxen 500mg  bid x5 days for presumed gout flare of right foot. this has been called into the pharmacy today and he will go pick it up and start. He will also come to opc tomorrow morning for further evaluation foot given that he has generalized swelling and pain. Colchicine has worked in the past, which could consider if NSAIDs fail. He will benefit from being on allopurinol given elevated uric acid level and this was to be discussed on next visit.   He voiced understanding of plan.

## 2014-04-30 NOTE — Addendum Note (Signed)
Addended by: Wilber Oliphant on: 04/30/2014 03:35 PM   Modules accepted: Orders, Medications

## 2014-05-01 ENCOUNTER — Ambulatory Visit: Payer: Medicare Other | Admitting: Internal Medicine

## 2014-05-06 ENCOUNTER — Encounter: Payer: Self-pay | Admitting: Internal Medicine

## 2014-05-06 ENCOUNTER — Ambulatory Visit (INDEPENDENT_AMBULATORY_CARE_PROVIDER_SITE_OTHER): Payer: Medicare Other | Admitting: Internal Medicine

## 2014-05-06 VITALS — BP 127/78 | HR 100 | Temp 97.1°F | Resp 20 | Ht 69.5 in | Wt 388.9 lb

## 2014-05-06 DIAGNOSIS — M109 Gout, unspecified: Secondary | ICD-10-CM | POA: Insufficient documentation

## 2014-05-06 DIAGNOSIS — M10071 Idiopathic gout, right ankle and foot: Secondary | ICD-10-CM

## 2014-05-06 LAB — BASIC METABOLIC PANEL WITH GFR
BUN: 11 mg/dL (ref 6–23)
CALCIUM: 9.6 mg/dL (ref 8.4–10.5)
CO2: 31 mEq/L (ref 19–32)
CREATININE: 0.91 mg/dL (ref 0.50–1.35)
Chloride: 101 mEq/L (ref 96–112)
Glucose, Bld: 124 mg/dL — ABNORMAL HIGH (ref 70–99)
Potassium: 4.1 mEq/L (ref 3.5–5.3)
Sodium: 140 mEq/L (ref 135–145)

## 2014-05-06 MED ORDER — IBUPROFEN 600 MG PO TABS: 600.0000 mg | ORAL_TABLET | Freq: Three times a day (TID) | ORAL | Status: DC | PRN

## 2014-05-06 MED ORDER — PREDNISONE 10 MG PO TABS: 50.0000 mg | ORAL_TABLET | ORAL | Status: DC

## 2014-05-06 MED ORDER — PREDNISONE 50 MG PO TABS: 50.0000 mg | ORAL_TABLET | ORAL | Status: DC

## 2014-05-06 NOTE — Assessment & Plan Note (Addendum)
Assessment: Most likely diagnosis is persistent gout flare in view of his history of the same and elevated uric acid level of 13. Ddx: Physical examination findings do not suggest septic arthritis. Other differentials include osteoarthritis. He clearly has not responded well to NSAID over the past several weeks.  Plan: 1. Labs/imaging: Check renal function in the setting of NSAID use recently. 2. Therapy: Discussed trial of steroids with prednisone 50 mg daily for 7 days and then taper off over another week. Patient would like to try. This might be a better option than nonsteroidal anti-inflammatory medications which he has tried for the last 4 weeks. 3. Follow up: Follow-up in 2 weeks and consider initiation of allopurinol for antihyperuricemic therapy.

## 2014-05-06 NOTE — Progress Notes (Signed)
Internal Medicine Clinic Attending  If gout not getting better, prednisone an option.  Case discussed with Dr. Alice Rieger soon after the resident saw the patient.  We reviewed the resident's history and exam and pertinent patient test results.  I agree with the assessment, diagnosis, and plan of care documented in the resident's note.

## 2014-05-06 NOTE — Patient Instructions (Signed)
General Instructions: Please take Ibuprofen 600mg  up to three time a day  Please come back in 2 weeks to see if we can start on preventative treatment Please bring your medicines with you each time you come to clinic.  Medicines may include prescription medications, over-the-counter medications, herbal remedies, eye drops, vitamins, or other pills.   Progress Toward Treatment Goals:  Treatment Goal 04/04/2014  Blood pressure at goal    Self Care Goals & Plans:  Self Care Goal 05/06/2014  Manage my medications take my medicines as prescribed; bring my medications to every visit; refill my medications on time  Eat healthy foods eat baked foods instead of fried foods; eat smaller portions; eat foods that are low in salt  Be physically active find an activity I enjoy    No flowsheet data found.   Care Management & Community Referrals:  No flowsheet data found.

## 2014-05-06 NOTE — Progress Notes (Signed)
Patient ID: William Davila, male   DOB: Feb 10, 1966, 48 y.o.   MRN: [id number]   Subjective:   HPI: William Davila is a 48 y.o. man with past medical history of gout, hypertension, obesity and other medical problems presents for a follow up visit for acute gout flare.  Reason(s) for this visit: 1. Acute gout flare: He has been on and off nonsteroidal anti-inflammatory medication over the last 3-4 weeks. Initially his gout flare was located in the left wrist, but this quickly resolved with ibuprofen. His main concern today is pain in the right ankle joint which has somehow persisted over the past week or so. Yesterday he completed a 5 days course of naproxen 500 mg twice a day with good relief. This morning he took ibuprofen 1200 mg, which also appeared to improve his pain. During his last visit on 04/04/2014, his uric acid level was found to be elevated at 13 mg/dL. The plan will be initiation of antihyperuricemic therapy once his flare has completely resolved. He denies any constitutional symptoms. He has been tolerating nonsteroidal anti-inflammatory medications without side effects.   ROS: Constitutional: Denies fever, chills, diaphoresis, appetite change and fatigue.  Respiratory: Denies SOB, DOE, cough, chest tightness, and wheezing. Denies chest pain. CVS: No chest pain, palpitations and leg swelling.  GI: No abdominal pain, nausea, vomiting, bloody stools GU: No dysuria, frequency, hematuria, or flank pain.  MSK: No myalgias. He has chronic low back pain, which is stable. Besides the right ankle joint he denies any other joint involvements.  Psych: No depression symptoms. No SI or SA.    Objective:  Physical Exam: Filed Vitals:   05/06/14 1329  BP: 127/78  Pulse: 100  Temp: 97.1 F (36.2 C)  TempSrc: Oral  Resp: 20  Height: 5' 9.5" (1.765 m)  Weight: 388 lb 14.4 oz (176.404 kg)  SpO2: 98%   General: Well nourished. No acute distress.  HEENT: Normal oral mucosa. MMM.    Lungs: CTA bilaterally. Heart: RRR; no extra sounds or murmurs  Abdomen: Non-distended, normal bowel sounds, soft, nontender; no hepatosplenomegaly  Extremities: Bilateral mild to moderate pedal edema. Right ankle joint is tender on palpation but without swelling or any other signs of inflammation or infection. Full range of motion. Neurologic: Normal EOM,  Alert and oriented x3. No obvious neurologic/cranial nerve deficits.  Assessment & Plan:  Discussed case with my attending in the clinic, Dr. Ellwood Dense. See problem based charting.

## 2014-05-20 ENCOUNTER — Emergency Department (HOSPITAL_COMMUNITY)
Admission: EM | Admit: 2014-05-20 | Discharge: 2014-05-20 | Disposition: A | Discharge status: Home / Self Care | Payer: Medicare Other | Attending: Emergency Medicine | Admitting: Emergency Medicine

## 2014-05-20 ENCOUNTER — Encounter (HOSPITAL_COMMUNITY): Payer: Self-pay

## 2014-05-20 ENCOUNTER — Encounter: Payer: Self-pay | Admitting: Internal Medicine

## 2014-05-20 ENCOUNTER — Emergency Department (HOSPITAL_COMMUNITY): Payer: Medicare Other

## 2014-05-20 ENCOUNTER — Other Ambulatory Visit: Payer: Self-pay

## 2014-05-20 ENCOUNTER — Ambulatory Visit (INDEPENDENT_AMBULATORY_CARE_PROVIDER_SITE_OTHER): Payer: Medicare Other | Admitting: Internal Medicine

## 2014-05-20 ENCOUNTER — Ambulatory Visit (HOSPITAL_COMMUNITY)
Admit: 2014-05-20 | Discharge: 2014-05-20 | Disposition: A | Discharge status: Home / Self Care | Payer: Medicare Other | Attending: Internal Medicine | Admitting: Internal Medicine

## 2014-05-20 VITALS — BP 125/64 | HR 98 | Temp 98.2°F | Ht 70.0 in | Wt 388.2 lb

## 2014-05-20 DIAGNOSIS — I5022 Chronic systolic (congestive) heart failure: Secondary | ICD-10-CM

## 2014-05-20 DIAGNOSIS — R079 Chest pain, unspecified: Secondary | ICD-10-CM

## 2014-05-20 DIAGNOSIS — Z9889 Other specified postprocedural states: Secondary | ICD-10-CM | POA: Insufficient documentation

## 2014-05-20 DIAGNOSIS — R0789 Other chest pain: Secondary | ICD-10-CM

## 2014-05-20 DIAGNOSIS — Z79899 Other long term (current) drug therapy: Secondary | ICD-10-CM | POA: Insufficient documentation

## 2014-05-20 DIAGNOSIS — Z95810 Presence of automatic (implantable) cardiac defibrillator: Secondary | ICD-10-CM | POA: Insufficient documentation

## 2014-05-20 DIAGNOSIS — M109 Gout, unspecified: Secondary | ICD-10-CM

## 2014-05-20 DIAGNOSIS — M179 Osteoarthritis of knee, unspecified: Secondary | ICD-10-CM | POA: Insufficient documentation

## 2014-05-20 DIAGNOSIS — Z8639 Personal history of other endocrine, nutritional and metabolic disease: Secondary | ICD-10-CM | POA: Diagnosis not present

## 2014-05-20 DIAGNOSIS — I10 Essential (primary) hypertension: Secondary | ICD-10-CM | POA: Insufficient documentation

## 2014-05-20 LAB — CBC
HCT: 37.1 % — ABNORMAL LOW (ref 39.0–52.0)
Hemoglobin: 12.4 g/dL — ABNORMAL LOW (ref 13.0–17.0)
MCH: 28.6 pg (ref 26.0–34.0)
MCHC: 33.4 g/dL (ref 30.0–36.0)
MCV: 85.7 fL (ref 78.0–100.0)
Platelets: 511 K/uL — ABNORMAL HIGH (ref 150–400)
RBC: 4.33 MIL/uL (ref 4.22–5.81)
RDW: 14.2 % (ref 11.5–15.5)
WBC: 14.6 K/uL — ABNORMAL HIGH (ref 4.0–10.5)

## 2014-05-20 LAB — BASIC METABOLIC PANEL WITH GFR
Anion gap: 17 — ABNORMAL HIGH (ref 5–15)
BUN: 14 mg/dL (ref 6–23)
CO2: 26 meq/L (ref 19–32)
Calcium: 9.5 mg/dL (ref 8.4–10.5)
Chloride: 101 meq/L (ref 96–112)
Creatinine, Ser: 1.02 mg/dL (ref 0.50–1.35)
GFR calc Af Amer: >90 mL/min
GFR calc non Af Amer: 86 mL/min — ABNORMAL LOW
Glucose, Bld: 116 mg/dL — ABNORMAL HIGH (ref 70–99)
Potassium: 3.3 meq/L — ABNORMAL LOW (ref 3.7–5.3)
Sodium: 144 meq/L (ref 137–147)

## 2014-05-20 LAB — I-STAT TROPONIN, ED: TROPONIN I, POC: 0 ng/mL (ref 0.00–0.08)

## 2014-05-20 LAB — POCT I-STAT TROPONIN I: Troponin i, poc: 0.01 ng/mL (ref 0.00–0.08)

## 2014-05-20 LAB — PRO B NATRIURETIC PEPTIDE: Pro B Natriuretic peptide (BNP): 228.6 pg/mL — ABNORMAL HIGH (ref 0–125)

## 2014-05-20 MED ORDER — HYDROCODONE-ACETAMINOPHEN 5-325 MG PO TABS: 1.0000 | ORAL_TABLET | Freq: Four times a day (QID) | ORAL | Status: DC | PRN

## 2014-05-20 NOTE — Progress Notes (Signed)
Patient ID: JAKEEL STARLIPER, male   DOB: 10/02/1965, 48 y.o.   MRN: [id number]  Subjective:   Patient ID: TORI CUPPS male   DOB: [birth year] 48 y.o.   MRN: [id number]  HPI: Mr.Siddarth L Lama is a 48 y.o. 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  Pt states that he never receive the prednisone, but the swelling in his foot improved and the pain has resolved. He is not on a maintenance medication for his gout. Uric acid 13 04/04/14.  He also states that over the past 2-3 days he has been having episodes of "crushing" midsternal chest pain with radiation to his back. The pain is mildly improved with ibuprofen 600mg , which he is taking q8hrs since the onset of the pain. He denies pain currently and states that he last had pain this morning. He denies radiation to the jaw or left arm, diaphoresis, dizziness, or nausea/vomiting or abdominal pain.   EKG with sinus tachycardia with 1st degree AV block with PACs and increased widening of QRS with repolarization abnormalities in V2-V4 and pseudonormalization of T waves in II, III, and aVF.     Past Medical History  Diagnosis Date  . Chronic systolic heart failure     Diagnosed 2001; prior patient of Dr. Linard Millers with [name] - discharged from practice  . Dilated cardiomyopathy     LVEF 10-20%  . Hyperlipidemia   . Essential hypertension, benign   . Morbid obesity   . Gout   . 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 Arthritis     knee's  . VT (ventricular tachycardia) 06/07/2011   Current Outpatient Prescriptions  Medication Sig Dispense Refill  . acetaminophen (TYLENOL) 500 MG tablet Take 1,000 mg by mouth every 6 (six) hours as needed. For pain    . carvedilol (COREG) 25 MG tablet Take 1 tablet (25 mg total) by mouth 2 (two) times daily. 60 tablet 3  . digoxin (LANOXIN) 0.25 MG tablet Take 1 tablet (250 mcg total) by mouth daily. 30 tablet 3  .  ibuprofen (ADVIL,MOTRIN) 600 MG tablet Take 1 tablet (600 mg total) by mouth every 8 (eight) hours as needed for moderate pain. 20 tablet 0  . lisinopril (PRINIVIL,ZESTRIL) 5 MG tablet Take 1 tablet (5 mg total) by mouth daily. 30 tablet 5  . predniSONE (DELTASONE) 10 MG tablet Take 5 tablets (50 mg total) by mouth as directed. Prednisone 50 mg before breakfast for 7 days, then 40 mg before breakfast for 3, then 20 mg before breakfast for 3 days and then 10 mg before breakfast for 3 days and then stop. 56 tablet 0  . torsemide (DEMADEX) 20 MG tablet Take 3 tablets (60 mg total) by mouth daily. 90 tablet 6   No current facility-administered medications for this visit.   Family History  Problem Relation Age of Onset  . Hypertension Mother    History   Social History  . Marital Status: Single    Spouse Name: N/A    Number of Children: N/A  . Years of Education: N/A   Social History Main Topics  . Smoking status: Never Smoker   . Smokeless tobacco: Never Used  . Alcohol Use: No  . Drug Use: No  . Sexual Activity: Yes   Other Topics Concern  . None   Social History Narrative   Review of Systems: A 12 point ROS was performed;  pertinent positives and negatives were noted in the HPI   Objective:  Physical Exam: Filed Vitals:   05/20/14 1335  BP: 125/64  Pulse: 98  Temp: 98.2 F (36.8 C)  TempSrc: Oral  Height: 5\' 10"  (1.778 m)  Weight: 388 lb 3.2 oz (176.086 kg)  SpO2: 98%   Constitutional: Vital signs reviewed.  Patient is a morbidly obese male in no acute distress presently and cooperative with exam. Alert and oriented x3.  Head: Normocephalic and atraumatic Eyes: PERRL, EOMI Neck: Supple, Trachea midline, normal ROM, No JVD Cardiovascular: Tachycardic, no MRG Pulmonary/Chest: Normal respiratory effort, mildly course breath sounds diffusely Abdominal: Soft. Non-tender, non-distended Musculoskeletal: Moves all 4 extremities  Neurological: A&O x3, cranial nerve II-XII  are grossly intact, no focal motor deficit Skin: Warm, dry and intact. Psychiatric: Normal mood and affect. Speech and behavior is normal.   Assessment & Plan:   Please refer to Problem List based Assessment and Plan

## 2014-05-20 NOTE — Assessment & Plan Note (Addendum)
Uric acid 13 04/04/14. Pt seen 05/06/14 for gout flare and was prescribed a steroid taper. Pt did not get the Prednisone from his pharmacy. He states that the pain and swelling are better despite no therapy. Today I was unable to evaluate his foot given his report of "crushing chest pain." He is not on a urate lowering medication currently. This will need to be added at his next appt, since he as sent to the ED, now that he is out of an acute flare.

## 2014-05-20 NOTE — Discharge Instructions (Signed)
1. Medications: vicodin for severe pain, usual home medications 2. Treatment: rest, drink plenty of fluids, gentle stretching 3. Follow Up: Please followup with your primary doctor and/or cardiology in 3 days for discussion of your diagnoses and further evaluation after today's visit; if you do not have a primary care doctor use the resource guide provided to find one; Please return to the ER for chest pain that occurs with exertion or at rest or chest pain or shortness of breath that is associated with nausea, diaphoresis or other concerning symptoms. Please return to the emergency department for any symptoms that are concerning to you.  Chest Wall Pain Chest wall pain is pain in or around the bones and muscles of your chest. It may take up to 6 weeks to get better. It may take longer if you must stay physically active in your work and activities.  CAUSES  Chest wall pain may happen on its own. However, it may be caused by:  A viral illness like the flu.  Injury.  Coughing.  Exercise.  Arthritis.  Fibromyalgia.  Shingles. HOME CARE INSTRUCTIONS   Avoid overtiring physical activity. Try not to strain or perform activities that cause pain. This includes any activities using your chest or your abdominal and side muscles, especially if heavy weights are used.  Put ice on the sore area.  Put ice in a plastic bag.  Place a towel between your skin and the bag.  Leave the ice on for 15-20 minutes per hour while awake for the first 2 days.  Only take over-the-counter or prescription medicines for pain, discomfort, or fever as directed by your caregiver. SEEK IMMEDIATE MEDICAL CARE IF:   Your pain increases, or you are very uncomfortable.  You have a fever.  Your chest pain becomes worse.  You have new, unexplained symptoms.  You have nausea or vomiting.  You feel sweaty or lightheaded.  You have a cough with phlegm (sputum), or you cough up blood. MAKE SURE YOU:    Understand these instructions.  Will watch your condition.  Will get help right away if you are not doing well or get worse. Document Released: 05/31/2005 Document Revised: 08/23/2011 Document Reviewed: 01/25/2011 [facility] Patient Information 2015 [place], [place]. This information is not intended to replace advice given to you by your health care provider. Make sure you discuss any questions you have with your health care provider.

## 2014-05-20 NOTE — Assessment & Plan Note (Signed)
Crushing chest pain x2-3 days with radiation to his back, mildly improved with ibuprofen. Pt with h/o dilated cardiomyopathy with defibrillator. EKG obtained in the clinic with changes including pseudonormalization of T waves in inferior leads and increased widening of QRS with likely repolarization abnormalities in V2-V4. Pt sent to the ED given his EKG changes for emergent management and evaluation He will need STAT troponins and will likely need to be seen by Cardiology. With the crushing pain with radiation and his h/o HTN, I am concerned for aortic dissection; however he endorses compliance with his medications and his blood pressure is controlled currently. Possibly consider CTA chest to r/o dissection.

## 2014-05-20 NOTE — ED Notes (Signed)
Pt. Reports centralized chest pain x 2-3 days, denies radiaton. Reports pain only with movement. Denies N/V, diaphoresis, SOB. Hx of CHF and has a defibrillator placed. Alert and oriented x4 in triage, no distress noted.

## 2014-05-20 NOTE — Progress Notes (Signed)
Taken to ER via w/c per Dr Eulas Post. Hilda Blades Jamelia Varano RN 05/20/14 2:10PM

## 2014-05-20 NOTE — ED Provider Notes (Signed)
CSN: [id number]     Arrival date & time 05/20/14  1409 History   First MD Initiated Contact with Patient 05/20/14 1625     Chief Complaint  Patient presents with  . Chest Pain     (Consider location/radiation/quality/duration/timing/severity/associated sxs/prior Treatment) Patient is a 48 y.o. male presenting with chest pain. The history is provided by the patient and medical records. No language interpreter was used.  Chest Pain Associated symptoms: cough (persistent for the last 3 weeks since URI, but improving)   Associated symptoms: no abdominal pain, no back pain, no diaphoresis, no fatigue, no fever, no headache, no nausea, no shortness of breath and not vomiting      William Davila is a 48 y.o. male  with a hx of HLD, HTN, gout, and dilated cardiomyopathy with defibrillator (Cardiology - [facility]) presents to the Emergency Department complaining of intermittent sharp central chest pain x 3 days.  Pt specifically denies the feeling of tearing, radiation to the back and numbness, weakness or other neurologic complaints (even intermittent).  Pt reports his pain occurs when he sits up to get out of bed, pushes himself up off the couch and again when he raised his arms over his head for his CXR today.  Pt denies heaviness or a crushing feeling.  He denies CP, SOB, nausea, or diaphoresis with exertion or at rest.  He denies firing of his defibrillator.  He reports these episodes of chest pain occur only with these specific movements and resolve when the movement is compete, but are very painful while they are occuring . Pt reports he had a URI approx 3 weeks ago and has had a lingering, but improving cough.  He denies fevers, chills or production of the cough.  Pt reports taking ibuprofen 600mg  with improvement.    Past Medical History  Diagnosis Date  . Chronic systolic heart failure     Diagnosed 2001; prior patient of Dr. Linard Millers with [name] - discharged from practice  . Dilated  cardiomyopathy     LVEF 10-20%  . Hyperlipidemia   . Essential hypertension, benign   . Morbid obesity   . Gout   . 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 Arthritis     knee's  . VT (ventricular tachycardia) 06/07/2011   Past Surgical History  Procedure Laterality Date  . Icd  10/27/2011   Family History  Problem Relation Age of Onset  . Hypertension Mother    History  Substance Use Topics  . Smoking status: Never Smoker   . Smokeless tobacco: Never Used  . Alcohol Use: No    Review of Systems  Constitutional: Negative for fever, diaphoresis, appetite change, fatigue and unexpected weight change.  HENT: Negative for mouth sores.   Eyes: Negative for visual disturbance.  Respiratory: Positive for cough (persistent for the last 3 weeks since URI, but improving). Negative for chest tightness, shortness of breath and wheezing.   Cardiovascular: Positive for chest pain ( sharp, intermittent).  Gastrointestinal: Negative for nausea, vomiting, abdominal pain, diarrhea and constipation.  Endocrine: Negative for polydipsia, polyphagia and polyuria.  Genitourinary: Negative for dysuria, urgency, frequency and hematuria.  Musculoskeletal: Negative for back pain and neck stiffness.  Skin: Negative for rash.  Allergic/Immunologic: Negative for immunocompromised state.  Neurological: Negative for syncope, light-headedness and headaches.  Hematological: Does not bruise/bleed easily.  Psychiatric/Behavioral: Negative for sleep disturbance. The patient is not nervous/anxious.       Allergies  Beta adrenergic blockers  and Isosorb dinitrate-hydralazine  Home Medications   Prior to Admission medications   Medication Sig Start Date End Date Taking? Authorizing Provider  acetaminophen (TYLENOL) 500 MG tablet Take 1,000 mg by mouth every 6 (six) hours as needed. For pain   Yes Historical Provider, MD  carvedilol (COREG) 25 MG tablet Take 1 tablet (25 mg total) by  mouth 2 (two) times daily. 04/17/14  Yes Jolaine Artist, MD  digoxin (LANOXIN) 0.25 MG tablet Take 1 tablet (250 mcg total) by mouth daily. 04/17/14  Yes Jolaine Artist, MD  ibuprofen (ADVIL,MOTRIN) 600 MG tablet Take 1 tablet (600 mg total) by mouth every 8 (eight) hours as needed for moderate pain. 05/06/14  Yes Jessee Avers, MD  lisinopril (PRINIVIL,ZESTRIL) 5 MG tablet Take 1 tablet (5 mg total) by mouth daily. 11/08/13  Yes Deboraha Sprang, MD  torsemide (DEMADEX) 20 MG tablet Take 3 tablets (60 mg total) by mouth daily. 10/08/13  Yes Amy D Clegg, NP  HYDROcodone-acetaminophen (NORCO/VICODIN) 5-325 MG per tablet Take 1 tablet by mouth every 6 (six) hours as needed for moderate pain or severe pain. 05/20/14   Namita Yearwood, PA-C   BP 123/87 mmHg  Pulse 99  Temp(Src) 98.3 F (36.8 C) (Oral)  Resp 21  Ht 5\' 10"  (1.778 m)  Wt 388 lb (175.996 kg)  BMI 55.67 kg/m2  SpO2 97% Physical Exam  Constitutional: He appears well-developed and well-nourished. No distress.  Awake, alert, nontoxic appearance Obese  HENT:  Head: Normocephalic and atraumatic.  Mouth/Throat: Oropharynx is clear and moist. No oropharyngeal exudate.  Eyes: Conjunctivae are normal. No scleral icterus.  Neck: Normal range of motion. Neck supple.  Cardiovascular: Normal rate, regular rhythm, normal heart sounds and intact distal pulses.   No murmur heard. Pulmonary/Chest: Effort normal and breath sounds normal. No respiratory distress. He has no wheezes. He exhibits no tenderness.  Equal chest expansion Coarse and equal breath sounds without focal rhonchi or rales; good tidal volume without diminishment of lung sounds Reproducible chest pain with lowering of arms from above the head; no reproduction with light palpation  Abdominal: Soft. Bowel sounds are normal. He exhibits no distension and no mass. There is no tenderness. There is no rebound and no guarding.  Musculoskeletal: Normal range of motion. He  exhibits no edema.  Lymphadenopathy:    He has no cervical adenopathy.  Neurological: He is alert. He exhibits normal muscle tone. Coordination normal.  Speech is clear and goal oriented Moves extremities without ataxia  Skin: Skin is warm and dry. He is not diaphoretic. No erythema.  Psychiatric: He has a normal mood and affect.  Nursing note and vitals reviewed.   ED Course  Procedures (including critical care time) Labs Review Labs Reviewed  CBC - Abnormal; Notable for the following:    WBC 14.6 (*)    Hemoglobin 12.4 (*)    HCT 37.1 (*)    Platelets 511 (*)    All other components within normal limits  BASIC METABOLIC PANEL - Abnormal; Notable for the following:    Potassium 3.3 (*)    Glucose, Bld 116 (*)    GFR calc non Af Amer 86 (*)    Anion gap 17 (*)    All other components within normal limits  PRO B NATRIURETIC PEPTIDE - Abnormal; Notable for the following:    Pro B Natriuretic peptide (BNP) 228.6 (*)    All other components within normal limits  I-STAT TROPOININ, ED  Randolm Idol, ED  Imaging Review Dg Chest 2 View  05/20/2014   CLINICAL DATA:  Chest pain  EXAM: CHEST  2 VIEW  COMPARISON:  10/28/2011  FINDINGS: AICD unchanged from the prior study. Cardiac enlargement without heart failure or edema. Negative for infiltrate or effusion.  IMPRESSION: No active cardiopulmonary disease.   Electronically Signed   By: Franchot Gallo M.D.   On: 05/20/2014 16:01     EKG Interpretation   Date/Time:  Monday May 20 2014 14:13:25 EST Ventricular Rate:  102 PR Interval:  254 QRS Duration: 136 QT Interval:  332 QTC Calculation: 432 R Axis:   -30 Text Interpretation:  Sinus tachycardia with 1st degree A-V block with  occasional Premature ventricular complexes Left axis deviation Left  ventricular hypertrophy with QRS widening Abnormal ECG Confirmed by Jeneen Rinks   MD, [place] ([id number]) on 05/20/2014 5:17:31 PM      MDM   Final diagnoses:  Chest pain,  unspecified chest pain type  Chest wall pain  Chronic systolic heart failure    William Davila presents from the family practice clinic where he was evaluated for a recent gout flare up and endorsed chest pain.  H&P from family practice clinic indicated concern for possible aortic dissection however patient denies radiation of pain to the back, even transient neurologic symptoms, and tearing sensations.  Pt is chest pain-free at this time.  Pt with Hx of CHF, but is without shortness of breath, chest pain, PND or shortness of breath on exertion.  No rales on exam or clinical evidence of fluid overload; no peripheral edema or unilateral leg swelling.  EKG is unchanged from previous inpatient chest pain is not classic for ACS.  Doubt CHF exacerbation, aortic dissection, ACS. Patient's pain is more likely secondary to muscle strain from his persistent bronchitic cough.  Will obtain delta troponin.  6:15 PM Delta troponin negative.  His remains chest pain-free at this time. His EKG is without changes from previous. Patient remains alert, oriented, nontoxic and nonseptic appearing. He is without hypotension or hypertension. No pain with inspiration or hemoptysis. Recommend close follow-up with his primary care and cardiology.    I have personally reviewed patient's vitals, nursing note and any pertinent labs or imaging.  I performed an undressed physical exam.  The patient/guardian have been advised of the diagnosis and plan. I reviewed all labs and imaging including any potential incidental findings. We have discussed signs and symptoms that warrant return to the ED and they are listed in the discharge instructions.    Vital signs are stable at discharge.   BP 123/87 mmHg  Pulse 99  Temp(Src) 98.3 F (36.8 C) (Oral)  Resp 21  Ht 5\' 10"  (1.778 m)  Wt 388 lb (175.996 kg)  BMI 55.67 kg/m2  SpO2 97%  The patient was discussed with Dr. Jeneen Rinks who agrees with the treatment plan.   Jarrett Soho  Miara Emminger, PA-C 05/20/14 [place], MD 05/26/14 [phone]

## 2014-05-20 NOTE — ED Notes (Signed)
Pt. istat troponin negative. Not crossing over.

## 2014-05-21 NOTE — Progress Notes (Signed)
INTERNAL MEDICINE TEACHING ATTENDING ADDENDUM - Davidmichael Zarazua, MD: I reviewed and discussed at the time of visit with the resident Dr. Glenn, the patient's medical history, physical examination, diagnosis and results of pertinent tests and treatment and I agree with the patient's care as documented.  

## 2014-05-23 ENCOUNTER — Encounter (HOSPITAL_COMMUNITY): Payer: Self-pay | Admitting: Internal Medicine

## 2014-05-24 ENCOUNTER — Telehealth: Payer: Self-pay | Admitting: *Deleted

## 2014-05-24 NOTE — Telephone Encounter (Signed)
Pt called - wanted Prednisone called in. Talked with Dr Eulas Post - denied - foot swelling resolved at time of appt 05/20/14 before going to ER. Dr Eulas Post suggest ER FU with Dr Eula Fried 06/04/14 2:15PM - pt aware. To call clinic if any change with gout. Hilda Blades Hiep Ollis RN 05/24/14 1:30PM

## 2014-05-25 ENCOUNTER — Emergency Department (INDEPENDENT_AMBULATORY_CARE_PROVIDER_SITE_OTHER)
Admission: EM | Admit: 2014-05-25 | Discharge: 2014-05-25 | Disposition: A | Discharge status: Other Acute Inpatient Hospital | Payer: Medicare Other | Source: Home / Self Care | Attending: Family Medicine | Admitting: Family Medicine

## 2014-05-25 ENCOUNTER — Emergency Department (HOSPITAL_COMMUNITY)
Admission: EM | Admit: 2014-05-25 | Discharge: 2014-05-25 | Disposition: A | Discharge status: Home / Self Care | Payer: Medicare Other | Attending: Emergency Medicine | Admitting: Emergency Medicine

## 2014-05-25 ENCOUNTER — Encounter (HOSPITAL_COMMUNITY): Payer: Self-pay | Admitting: Emergency Medicine

## 2014-05-25 ENCOUNTER — Emergency Department (HOSPITAL_COMMUNITY): Payer: Medicare Other

## 2014-05-25 ENCOUNTER — Encounter (HOSPITAL_COMMUNITY): Payer: Self-pay

## 2014-05-25 DIAGNOSIS — Z79899 Other long term (current) drug therapy: Secondary | ICD-10-CM | POA: Insufficient documentation

## 2014-05-25 DIAGNOSIS — I5022 Chronic systolic (congestive) heart failure: Secondary | ICD-10-CM | POA: Insufficient documentation

## 2014-05-25 DIAGNOSIS — E785 Hyperlipidemia, unspecified: Secondary | ICD-10-CM | POA: Diagnosis not present

## 2014-05-25 DIAGNOSIS — M25531 Pain in right wrist: Secondary | ICD-10-CM | POA: Diagnosis present

## 2014-05-25 DIAGNOSIS — M170 Bilateral primary osteoarthritis of knee: Secondary | ICD-10-CM | POA: Insufficient documentation

## 2014-05-25 DIAGNOSIS — Z872 Personal history of diseases of the skin and subcutaneous tissue: Secondary | ICD-10-CM | POA: Diagnosis not present

## 2014-05-25 DIAGNOSIS — R609 Edema, unspecified: Secondary | ICD-10-CM

## 2014-05-25 DIAGNOSIS — M79609 Pain in unspecified limb: Secondary | ICD-10-CM

## 2014-05-25 DIAGNOSIS — Z95810 Presence of automatic (implantable) cardiac defibrillator: Secondary | ICD-10-CM | POA: Diagnosis not present

## 2014-05-25 DIAGNOSIS — M7989 Other specified soft tissue disorders: Secondary | ICD-10-CM

## 2014-05-25 DIAGNOSIS — I10 Essential (primary) hypertension: Secondary | ICD-10-CM | POA: Insufficient documentation

## 2014-05-25 DIAGNOSIS — R52 Pain, unspecified: Secondary | ICD-10-CM

## 2014-05-25 DIAGNOSIS — M79641 Pain in right hand: Secondary | ICD-10-CM

## 2014-05-25 DIAGNOSIS — M109 Gout, unspecified: Secondary | ICD-10-CM | POA: Insufficient documentation

## 2014-05-25 LAB — CBC WITH DIFFERENTIAL/PLATELET
BASOS ABS: 0 10*3/uL (ref 0.0–0.1)
Basophils Relative: 0 % (ref 0–1)
EOS PCT: 1 % (ref 0–5)
Eosinophils Absolute: 0.1 10*3/uL (ref 0.0–0.7)
HCT: 36.2 % — ABNORMAL LOW (ref 39.0–52.0)
Hemoglobin: 12 g/dL — ABNORMAL LOW (ref 13.0–17.0)
Lymphocytes Relative: 10 % — ABNORMAL LOW (ref 12–46)
Lymphs Abs: 1.3 10*3/uL (ref 0.7–4.0)
MCH: 28.2 pg (ref 26.0–34.0)
MCHC: 33.1 g/dL (ref 30.0–36.0)
MCV: 85.2 fL (ref 78.0–100.0)
Monocytes Absolute: 0.7 10*3/uL (ref 0.1–1.0)
Monocytes Relative: 6 % (ref 3–12)
Neutro Abs: 10.8 10*3/uL — ABNORMAL HIGH (ref 1.7–7.7)
Neutrophils Relative %: 83 % — ABNORMAL HIGH (ref 43–77)
Platelets: 571 10*3/uL — ABNORMAL HIGH (ref 150–400)
RBC: 4.25 MIL/uL (ref 4.22–5.81)
RDW: 14.2 % (ref 11.5–15.5)
WBC: 13 10*3/uL — ABNORMAL HIGH (ref 4.0–10.5)

## 2014-05-25 LAB — D-DIMER, QUANTITATIVE: D-Dimer, Quant: 1.79 ug{FEU}/mL — ABNORMAL HIGH (ref 0.00–0.48)

## 2014-05-25 MED ORDER — PREDNISONE 20 MG PO TABS
60.0000 mg | ORAL_TABLET | Freq: Once | ORAL | Status: AC
  Administered 2014-05-25: 60 mg via ORAL
  Filled 2014-05-25: qty 3

## 2014-05-25 MED ORDER — HYDROMORPHONE HCL 1 MG/ML IJ SOLN
INTRAMUSCULAR | Status: AC
  Filled 2014-05-25: qty 1

## 2014-05-25 MED ORDER — HYDROMORPHONE HCL 1 MG/ML IJ SOLN
1.0000 mg | Freq: Once | INTRAMUSCULAR | Status: AC
  Administered 2014-05-25: 1 mg via INTRAMUSCULAR

## 2014-05-25 MED ORDER — NAPROXEN 500 MG PO TABS: 500.0000 mg | ORAL_TABLET | Freq: Two times a day (BID) | ORAL | Status: DC

## 2014-05-25 MED ORDER — PREDNISONE 20 MG PO TABS: 40.0000 mg | ORAL_TABLET | Freq: Every day | ORAL | Status: DC

## 2014-05-25 MED ORDER — HYDROCODONE-ACETAMINOPHEN 5-325 MG PO TABS: 2.0000 | ORAL_TABLET | ORAL | Status: DC | PRN

## 2014-05-25 MED ORDER — HYDROCODONE-ACETAMINOPHEN 5-325 MG PO TABS
2.0000 | ORAL_TABLET | Freq: Once | ORAL | Status: AC
  Administered 2014-05-25: 2 via ORAL
  Filled 2014-05-25: qty 2

## 2014-05-25 MED ORDER — COLCHICINE 0.6 MG PO TABS
1.2000 mg | ORAL_TABLET | Freq: Once | ORAL | Status: AC
  Administered 2014-05-25: 1.2 mg via ORAL
  Filled 2014-05-25: qty 2

## 2014-05-25 NOTE — ED Notes (Signed)
Pt here from [facility] c/o right wrist and arm pain with swelling; pt sts hx of gout; pt sent for r/o DVT due to pt having elevated Ddimer

## 2014-05-25 NOTE — Discharge Instructions (Signed)
Take prednisone as directed until gone. Take Naprosyn as needed until gout improves. Take Vicodin as needed for extra pain relief. Refer to attached documents for more information.

## 2014-05-25 NOTE — Progress Notes (Signed)
VASCULAR LAB PRELIMINARY  PRELIMINARY  PRELIMINARY  PRELIMINARY  Right upper extremity venous Doppler completed.    Preliminary report:  There is no DVT or SVT noted in the right upper extremity.   Kylee Umana, RVT 05/25/2014, 3:40 PM

## 2014-05-25 NOTE — ED Provider Notes (Signed)
CSN: [id number]     Arrival date & time 05/25/14  1214 History   First MD Initiated Contact with Patient 05/25/14 1226     Chief Complaint  Patient presents with  . Wrist Pain     (Consider location/radiation/quality/duration/timing/severity/associated sxs/prior Treatment) HPI Comments: Patient is a 48 year old male with a past medical history of hypertension, gout, lichen simplex chronicus, CHF, and obesity who presents with right wrist pain that started 1 week ago. Patient sent from [facility] for DVT rule out. Symptoms started gradually and progressively worsened since the onset. The pain is aching and severe without radiation. Patient reports the pain feels like a gout flare but lasting long than normal. No history of DVT or PE. Patient has not tried anything for symptom relief.    Past Medical History  Diagnosis Date  . Chronic systolic heart failure     Diagnosed 2001; prior patient of Dr. Linard Millers with [name] - discharged from practice  . Dilated cardiomyopathy     LVEF 10-20%  . Hyperlipidemia   . Essential hypertension, benign   . Morbid obesity   . Gout   . 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 Arthritis     knee's  . VT (ventricular tachycardia) 06/07/2011   Past Surgical History  Procedure Laterality Date  . Icd  10/27/2011  . Implantable cardioverter defibrillator implant N/A 10/27/2011    Procedure: IMPLANTABLE CARDIOVERTER DEFIBRILLATOR IMPLANT;  Surgeon: Deboraha Sprang, MD;  Location: [facility] CATH LAB;  Service: Cardiovascular;  Laterality: N/A;   Family History  Problem Relation Age of Onset  . Hypertension Mother    History  Substance Use Topics  . Smoking status: Never Smoker   . Smokeless tobacco: Never Used  . Alcohol Use: No    Review of Systems  Constitutional: Negative for fever, chills and fatigue.  HENT: Negative for trouble swallowing.   Eyes: Negative for visual disturbance.  Respiratory: Negative for shortness of breath.    Cardiovascular: Negative for chest pain and palpitations.  Gastrointestinal: Negative for nausea, vomiting, abdominal pain and diarrhea.  Genitourinary: Negative for dysuria and difficulty urinating.  Musculoskeletal: Positive for joint swelling and arthralgias. Negative for neck pain.  Skin: Negative for color change.  Neurological: Negative for dizziness and weakness.  Psychiatric/Behavioral: Negative for dysphoric mood.      Allergies  Beta adrenergic blockers and Isosorb dinitrate-hydralazine  Home Medications   Prior to Admission medications   Medication Sig Start Date End Date Taking? Authorizing Provider  acetaminophen (TYLENOL) 500 MG tablet Take 1,000 mg by mouth every 6 (six) hours as needed. For pain   Yes Historical Provider, MD  carvedilol (COREG) 25 MG tablet Take 1 tablet (25 mg total) by mouth 2 (two) times daily. 04/17/14  Yes Jolaine Artist, MD  digoxin (LANOXIN) 0.25 MG tablet Take 1 tablet (250 mcg total) by mouth daily. 04/17/14  Yes Jolaine Artist, MD  HYDROcodone-acetaminophen (NORCO/VICODIN) 5-325 MG per tablet Take 1 tablet by mouth every 6 (six) hours as needed for moderate pain or severe pain. 05/20/14  Yes Hannah Muthersbaugh, PA-C  ibuprofen (ADVIL,MOTRIN) 600 MG tablet Take 1 tablet (600 mg total) by mouth every 8 (eight) hours as needed for moderate pain. 05/06/14  Yes Jessee Avers, MD  lisinopril (PRINIVIL,ZESTRIL) 5 MG tablet Take 1 tablet (5 mg total) by mouth daily. 11/08/13  Yes Deboraha Sprang, MD  torsemide (DEMADEX) 20 MG tablet Take 3 tablets (60 mg total) by mouth daily. 10/08/13  Yes Amy D Clegg, NP   BP 132/100 mmHg  Pulse 84  Temp(Src) 97.8 F (36.6 C) (Oral)  Resp 13  SpO2 95% Physical Exam  Constitutional: He is oriented to person, place, and time. He appears well-developed and well-nourished. No distress.  HENT:  Head: Normocephalic and atraumatic.  Eyes: Conjunctivae and EOM are normal.  Neck: Normal range of motion.   Cardiovascular: Normal rate and regular rhythm.  Exam reveals no gallop and no friction rub.   No murmur heard. Strong radial pulse of the right wrist.   Pulmonary/Chest: Effort normal and breath sounds normal. He has no wheezes. He has no rales. He exhibits no tenderness.  Abdominal: Soft. He exhibits no distension. There is no tenderness. There is no rebound.  Musculoskeletal:  Slightly limited ROM of right wrist due to pain. Generalized tenderness to palpation of right wrist without obvious deformity. The area is erythematous and warm to touch.   Neurological: He is alert and oriented to person, place, and time. Coordination normal.  Speech is goal-oriented. Moves limbs without ataxia.   Skin: Skin is warm and dry.  Psychiatric: He has a normal mood and affect. His behavior is normal.  Nursing note and vitals reviewed.   ED Course  Procedures (including critical care time) Labs Review Labs Reviewed - No data to display  Imaging Review Dg Wrist Complete Right  05/25/2014   CLINICAL DATA:  Right wrist pain for 1 week. No injury. History of gout.  EXAM: RIGHT WRIST - COMPLETE 3+ VIEW  COMPARISON:  07/02/2011  FINDINGS: No fracture. No bone lesion. Joints are normally spaced and aligned. No arthropathic change.  There is diffuse nonspecific soft tissue edema.  IMPRESSION: Diffuse nonspecific soft tissue edema. No fracture bone lesion. No radiographic arthropathic change.   Electronically Signed   By: Lajean Manes M.D.   On: 05/25/2014 14:23     EKG Interpretation None      MDM   Final diagnoses:  Pain  Acute gout of right hand, unspecified cause    2:25 PM Patient's DVT study and xray pending. Vitals stable and patient afebrile.   3:58 PM Patient's DVT study shows no clot. Patient's xray shows no acute fracture. Patient will be treated for gout. Patient will have colchicine and prednisone here. Patient will be discharged with prednisone, naprosyn and vicodin. Vitals stable  and patient afebrile. I doubt septic joint at this time. Patient instructed to return with worsening or concerning symptoms.   Alvina Chou, PA-C 05/25/14 [place], MD 05/26/14 1729

## 2014-05-25 NOTE — ED Provider Notes (Signed)
CSN: [id number]     Arrival date & time 05/25/14  4098 History   First MD Initiated Contact with Patient 05/25/14 1010     Chief Complaint  Patient presents with  . Wrist Pain   (Consider location/radiation/quality/duration/timing/severity/associated sxs/prior Treatment) HPI      48 year old male with history of gout presents complaining of a gout flare. He has had pain and swelling of his right upper extremity, hand and wrist, for one week. Pain has been gradually worsening. This pain started right after he was at the hospital for evaluation of chest pain. He notes that he had blood drawn out of the affected extremity. He does not think he had an IV placed. This is different from a typical gout flare because it is lasting longer than a gout flare has ever lasted. Also his hand does not usually swell. The pain in the wrist feels like gout. No history of DVT or PE. No recent travel. No systemic symptoms. No more chest pain  Past Medical History  Diagnosis Date  . Chronic systolic heart failure     Diagnosed 2001; prior patient of Dr. Linard Millers with [name] - discharged from practice  . Dilated cardiomyopathy     LVEF 10-20%  . Hyperlipidemia   . Essential hypertension, benign   . Morbid obesity   . Gout   . 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 Arthritis     knee's  . VT (ventricular tachycardia) 06/07/2011   Past Surgical History  Procedure Laterality Date  . Icd  10/27/2011  . Implantable cardioverter defibrillator implant N/A 10/27/2011    Procedure: IMPLANTABLE CARDIOVERTER DEFIBRILLATOR IMPLANT;  Surgeon: Deboraha Sprang, MD;  Location: [facility] CATH LAB;  Service: Cardiovascular;  Laterality: N/A;   Family History  Problem Relation Age of Onset  . Hypertension Mother    History  Substance Use Topics  . Smoking status: Never Smoker   . Smokeless tobacco: Never Used  . Alcohol Use: No    Review of Systems  Musculoskeletal: Positive for arthralgias.   Right arm pain from forearm through the right wrist, and swelling of the right hand  All other systems reviewed and are negative.   Allergies  Beta adrenergic blockers and Isosorb dinitrate-hydralazine  Home Medications   Prior to Admission medications   Medication Sig Start Date End Date Taking? Authorizing Provider  acetaminophen (TYLENOL) 500 MG tablet Take 1,000 mg by mouth every 6 (six) hours as needed. For pain    Historical Provider, MD  carvedilol (COREG) 25 MG tablet Take 1 tablet (25 mg total) by mouth 2 (two) times daily. 04/17/14   Jolaine Artist, MD  digoxin (LANOXIN) 0.25 MG tablet Take 1 tablet (250 mcg total) by mouth daily. 04/17/14   Jolaine Artist, MD  HYDROcodone-acetaminophen (NORCO/VICODIN) 5-325 MG per tablet Take 1 tablet by mouth every 6 (six) hours as needed for moderate pain or severe pain. 05/20/14   Hannah Muthersbaugh, PA-C  ibuprofen (ADVIL,MOTRIN) 600 MG tablet Take 1 tablet (600 mg total) by mouth every 8 (eight) hours as needed for moderate pain. 05/06/14   Jessee Avers, MD  lisinopril (PRINIVIL,ZESTRIL) 5 MG tablet Take 1 tablet (5 mg total) by mouth daily. 11/08/13   Deboraha Sprang, MD  torsemide (DEMADEX) 20 MG tablet Take 3 tablets (60 mg total) by mouth daily. 10/08/13   Amy D Clegg, NP   BP 142/87 mmHg  Pulse 94  Temp(Src) 99.4 F (37.4 C) (Oral)  Resp 18  SpO2 97% Physical Exam  Constitutional: He is oriented to person, place, and time. He appears well-developed and well-nourished. No distress.  HENT:  Head: Normocephalic.  Pulmonary/Chest: Effort normal. No respiratory distress.  Musculoskeletal:       Right wrist: He exhibits decreased range of motion (Decreased flexion and extension), tenderness (Diffuse, nonfocal) and swelling. He exhibits no deformity.       Right hand: He exhibits swelling. He exhibits no tenderness and normal two-point discrimination. Normal sensation noted. Decreased strength (decreased grip strength, secondary to  pain) noted.  Pitting edema of the right hand, 2+. No pyrexia  Neurological: He is alert and oriented to person, place, and time. Coordination normal.  Skin: Skin is warm and dry. No rash noted. He is not diaphoretic.  Psychiatric: He has a normal mood and affect. Judgment normal.  Nursing note and vitals reviewed.   ED Course  Procedures (including critical care time) Labs Review Labs Reviewed  CBC WITH DIFFERENTIAL - Abnormal; Notable for the following:    WBC 13.0 (*)    Hemoglobin 12.0 (*)    HCT 36.2 (*)    Platelets 571 (*)    Neutrophils Relative % 83 (*)    Neutro Abs 10.8 (*)    Lymphocytes Relative 10 (*)    All other components within normal limits  D-DIMER, QUANTITATIVE - Abnormal; Notable for the following:    D-Dimer, Quant 1.79 (*)    All other components within normal limits    Imaging Review No results found.   MDM   1. Right hand pain   2. Swelling of right hand   3. Pitting edema    Not typical of gout with pain from the elbow through the end of the arm.  D-dimer elevated.  Transferred to ED, R/O UEDVT.  Given 1 mg dilaudid IM prior to transfer for pain     Liam Graham, PA-C 05/25/14 1148

## 2014-05-25 NOTE — ED Notes (Signed)
C/o pain in right wrist x 1 week. History of gout , heart failure

## 2014-06-04 ENCOUNTER — Encounter: Payer: Self-pay | Admitting: Internal Medicine

## 2014-06-04 ENCOUNTER — Ambulatory Visit (INDEPENDENT_AMBULATORY_CARE_PROVIDER_SITE_OTHER): Payer: Medicare Other | Admitting: Internal Medicine

## 2014-06-04 VITALS — BP 132/69 | HR 96 | Temp 98.5°F | Ht 70.0 in | Wt 381.0 lb

## 2014-06-04 DIAGNOSIS — E790 Hyperuricemia without signs of inflammatory arthritis and tophaceous disease: Secondary | ICD-10-CM

## 2014-06-04 DIAGNOSIS — I5042 Chronic combined systolic (congestive) and diastolic (congestive) heart failure: Secondary | ICD-10-CM

## 2014-06-04 DIAGNOSIS — D649 Anemia, unspecified: Secondary | ICD-10-CM

## 2014-06-04 DIAGNOSIS — I10 Essential (primary) hypertension: Secondary | ICD-10-CM

## 2014-06-04 DIAGNOSIS — R7989 Other specified abnormal findings of blood chemistry: Secondary | ICD-10-CM

## 2014-06-04 DIAGNOSIS — D72829 Elevated white blood cell count, unspecified: Secondary | ICD-10-CM

## 2014-06-04 LAB — CBC WITH DIFFERENTIAL/PLATELET
Basophils Absolute: 0 10*3/uL (ref 0.0–0.1)
Basophils Relative: 0 % (ref 0–1)
Eosinophils Absolute: 0.1 10*3/uL (ref 0.0–0.7)
Eosinophils Relative: 1 % (ref 0–5)
HCT: 36.9 % — ABNORMAL LOW (ref 39.0–52.0)
HEMOGLOBIN: 12.6 g/dL — AB (ref 13.0–17.0)
LYMPHS ABS: 1.9 10*3/uL (ref 0.7–4.0)
LYMPHS PCT: 13 % (ref 12–46)
MCH: 28.1 pg (ref 26.0–34.0)
MCHC: 34.1 g/dL (ref 30.0–36.0)
MCV: 82.4 fL (ref 78.0–100.0)
MPV: 9.3 fL — AB (ref 9.4–12.4)
Monocytes Absolute: 0.7 10*3/uL (ref 0.1–1.0)
Monocytes Relative: 5 % (ref 3–12)
NEUTROS ABS: 11.8 10*3/uL — AB (ref 1.7–7.7)
NEUTROS PCT: 81 % — AB (ref 43–77)
Platelets: 580 10*3/uL — ABNORMAL HIGH (ref 150–400)
RBC: 4.48 MIL/uL (ref 4.22–5.81)
RDW: 15 % (ref 11.5–15.5)
WBC: 14.6 10*3/uL — ABNORMAL HIGH (ref 4.0–10.5)

## 2014-06-04 LAB — BASIC METABOLIC PANEL
BUN: 12 mg/dL (ref 6–23)
CALCIUM: 9.4 mg/dL (ref 8.4–10.5)
CHLORIDE: 98 meq/L (ref 96–112)
CO2: 31 mEq/L (ref 19–32)
CREATININE: 0.96 mg/dL (ref 0.50–1.35)
Glucose, Bld: 110 mg/dL — ABNORMAL HIGH (ref 70–99)
Potassium: 3.6 mEq/L (ref 3.5–5.3)
Sodium: 138 mEq/L (ref 135–145)

## 2014-06-04 MED ORDER — METHYLPREDNISOLONE 4 MG PO KIT: PACK | ORAL | Status: DC

## 2014-06-04 MED ORDER — ALLOPURINOL 100 MG PO TABS: 100.0000 mg | ORAL_TABLET | Freq: Every day | ORAL | Status: DC

## 2014-06-04 NOTE — Patient Instructions (Signed)
General Instructions:  Please bring your medicines with you each time you come to clinic.  Medicines may include prescription medications, over-the-counter medications, herbal remedies, eye drops, vitamins, or other pills.  Please start your allopurinol for high uric acid level, 100mg  daily. You should also start the steroid pack with the allopurinol to try to prevent another flare. If you do have another gout flare call and let us know right away  Please follow up with heart failure team  Progress Toward Treatment Goals:  Treatment Goal 06/04/2014  Blood pressure at goal    Self Care Goals & Plans:  Self Care Goal 06/04/2014  Manage my medications take my medicines as prescribed; bring my medications to every visit; refill my medications on time  Eat healthy foods eat more vegetables; eat foods that are low in salt; eat baked foods instead of fried foods  Be physically active find an activity I enjoy    No flowsheet data found.   Care Management & Community Referrals:  No flowsheet data found.    Allopurinol tablets What is this medicine? ALLOPURINOL (al oh PURE i nole) reduces the amount of uric acid the body makes. It is used to treat the symptoms of gout. It is also used to treat or prevent high uric acid levels that occur as a result of certain types of chemotherapy. This medicine may also help patients who frequently have kidney stones. This medicine may be used for other purposes; ask your health care provider or pharmacist if you have questions. COMMON BRAND NAME(S): Zyloprim What should I tell my health care provider before I take this medicine? They need to know if you have any of these conditions: -kidney or liver disease -an unusual or allergic reaction to allopurinol, other medicines, foods, dyes, or preservatives -pregnant or trying to get pregnant -breast feeding How should I use this medicine? Take this medicine by mouth with a glass of water. Follow the  directions on the prescription label. If this medicine upsets your stomach, take it with food or milk. Take your doses at regular intervals. Do not take your medicine more often than directed. Talk to your pediatrician regarding the use of this medicine in children. Special care may be needed. While this drug may be prescribed for children as Redd as 6 years for selected conditions, precautions do apply. Overdosage: If you think you have taken too much of this medicine contact a poison control center or emergency room at once. NOTE: This medicine is only for you. Do not share this medicine with others. What if I miss a dose? If you miss a dose, take it as soon as you can. If it is almost time for your next dose, take only that dose. Do not take double or extra doses. What may interact with this medicine? Do not take this medicine with the following medication: -didanosine, ddI This medicine may also interact with the following medications: -amoxicillin or ampicillin -azathioprine -certain medicines used to treat gout -certain types of diuretics -chlorpropamide -cyclosporine -dicumarol -mercaptopurine -tolbutamide -warfarin This list may not describe all possible interactions. Give your health care provider a list of all the medicines, herbs, non-prescription drugs, or dietary supplements you use. Also tell them if you smoke, drink alcohol, or use illegal drugs. Some items may interact with your medicine. What should I watch for while using this medicine? Visit your doctor or health care professional for regular checks on your progress. If you are taking this medicine to treat gout, you  may not have less frequent attacks at first. Keep taking your medicine regularly and the attacks should get better within 2 to 6 weeks. Drink plenty of water (10 to 12 full glasses a day) while you are taking this medicine. This will help to reduce stomach upset and reduce the risk of getting gout or kidney  stones. Call your doctor or health care professional at once if you get a skin rash together with chills, fever, sore throat, or nausea and vomiting, if you have blood in your urine, or difficulty passing urine. Do not take vitamin C without asking your doctor or health care professional. Too much vitamin C can increase the chance of getting kidney stones. You may get drowsy or dizzy. Do not drive, use machinery, or do anything that needs mental alertness until you know how this drug affects you. Do not stand or sit up quickly, especially if you are an older patient. This reduces the risk of dizzy or fainting spells. Alcohol can make you more drowsy and dizzy. Alcohol can also increase the chance of stomach problems and increase the amount of uric acid in your blood. Avoid alcoholic drinks. What side effects may I notice from receiving this medicine? Side effects that you should report to your doctor or health care professional as soon as possible: -allergic reactions like skin rash, itching or hives, swelling of the face, lips, or tongue -breathing problems -muscle aches or pains -redness, blistering, peeling or loosening of the skin, including inside the mouth Side effects that usually do not require medical attention (report to your doctor or health care professional if they continue or are bothersome): -changes in taste -diarrhea -indigestion -stomach pain or cramps This list may not describe all possible side effects. Call your doctor for medical advice about side effects. You may report side effects to FDA at 1-800-FDA-1088. Where should I keep my medicine? Keep out of the reach of children. Store at room temperature between 15 and 25 degrees C (59 and 77 degrees F). Protect from light and moisture. Throw away any unused medicine after the expiration date. NOTE: This sheet is a summary. It may not cover all possible information. If you have questions about this medicine, talk to your doctor,  pharmacist, or health care provider.  2015, Elsevier/Gold Standard. (2007-12-04 14:26:54)

## 2014-06-05 DIAGNOSIS — D72829 Elevated white blood cell count, unspecified: Secondary | ICD-10-CM | POA: Insufficient documentation

## 2014-06-05 DIAGNOSIS — D649 Anemia, unspecified: Secondary | ICD-10-CM | POA: Insufficient documentation

## 2014-06-05 NOTE — Assessment & Plan Note (Signed)
Hb in 12s down from 15 in 2013.  Currently up to 12.6. Also noted to have chronic thrombocytosis. We were not able to address this during our visit today, but will plan to readdress on follow up, consider fobt, and check iron studies, and will continue to monitor Hb.

## 2014-06-05 NOTE — Progress Notes (Signed)
Subjective:   Patient ID: William Davila male   DOB: Jan 10, 1966 48 y.o.   MRN: [id number]  HPI: [name] is a 48 y.o. with pmh as listed below presenting to clinic today for gout followup.  Recurrent gout flares--elevated uric acid level of 13 and on torsemide. Recently completed course of prednisone for presumed attack of wrist that has since resolved--ED/UC visit 05/25/14 with RUE venous doppler negative for DVT or SVT prelim report . He did take one tablet of naproxen today for mild pain. Currently, he denies any joint pain or swelling and does not feel like he has any gout issues. He is agreeable to start allopurinol today. He is familiar with the medicine because his mother takes it as well.   Combined CHF-- has appt with HF team early jan (initially end of December but he says he got a call to reschedule). On torsemide and digoxin currently. He has been in the 380lb range since September, however, since the 7th of December has lost 7 pounds. He feels like his HF is stable at this time and urinates often due to diuretics. No current chest pain (was sent to the ED from [facility] on 12/7 for crushing chest pain thought to be secondary to muscle strain from coughing and was later discharged home). He says that pain has resolved and has had no similar complaints since then.   Past Medical History  Diagnosis Date  . Chronic systolic heart failure     Diagnosed 2001; prior patient of Dr. Linard [facility] with [facility] - discharged from practice  . Dilated cardiomyopathy     LVEF 10-20%  . Hyperlipidemia   . Essential hypertension, benign   . Morbid obesity   . Gout   . 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 Arthritis     knee's  . VT (ventricular tachycardia) 06/07/2011   Current Outpatient Prescriptions  Medication Sig Dispense Refill  . carvedilol (COREG) 25 MG tablet Take 1 tablet (25 mg total) by mouth 2 (two) times daily. 60 tablet 3  . digoxin (LANOXIN) 0.25 MG  tablet Take 1 tablet (250 mcg total) by mouth daily. 30 tablet 3  . lisinopril (PRINIVIL,ZESTRIL) 5 MG tablet Take 1 tablet (5 mg total) by mouth daily. 30 tablet 5  . naproxen (NAPROSYN) 500 MG tablet Take 1 tablet (500 mg total) by mouth 2 (two) times daily with a meal. 30 tablet 0  . torsemide (DEMADEX) 20 MG tablet Take 3 tablets (60 mg total) by mouth daily. 90 tablet 6  . acetaminophen (TYLENOL) 500 MG tablet Take 1,000 mg by mouth every 6 (six) hours as needed. For pain    . allopurinol (ZYLOPRIM) 100 MG tablet Take 1 tablet (100 mg total) by mouth daily. 30 tablet 1  . HYDROcodone-acetaminophen (NORCO/VICODIN) 5-325 MG per tablet Take 2 tablets by mouth every 4 (four) hours as needed for moderate pain or severe pain. (Patient not taking: Reported on 06/04/2014) 12 tablet 0  . ibuprofen (ADVIL,MOTRIN) 600 MG tablet Take 1 tablet (600 mg total) by mouth every 8 (eight) hours as needed for moderate pain. (Patient not taking: Reported on 06/04/2014) 20 tablet 0  . methylPREDNISolone (MEDROL DOSEPAK) 4 MG tablet follow package directions 21 tablet 0   No current facility-administered medications for this visit.   Family History  Problem Relation Age of Onset  . Hypertension Mother    History   Social History  . Marital Status: Single    Spouse  Name: N/A    Number of Children: N/A  . Years of Education: N/A   Social History Main Topics  . Smoking status: Never Smoker   . Smokeless tobacco: Never Used  . Alcohol Use: No  . Drug Use: No  . Sexual Activity: Yes   Other Topics Concern  . None   Social History Narrative   Review of Systems:  Constitutional:  Denies fever, chills   Respiratory:  Denies SOB  Cardiovascular:  Denies chest pain  Gastrointestinal:  Denies nausea, vomiting, abdominal pain   Genitourinary:  Frequency with diuretics  Musculoskeletal:  Denies gait problem.   Skin:  Denies pallor, rash and wound.   Neurological:  Denies headaches.    Objective:    Physical Exam: Filed Vitals:   06/04/14 1439  BP: 132/69  Pulse: 96  Temp: 98.5 F (36.9 C)  TempSrc: Oral  Height: 5\' 10"  (1.778 m)  Weight: 381 lb (172.82 kg)  SpO2: 99%   Vitals reviewed. General: sitting in chair, NAD HEENT: EOMI Cardiac: RRR Pulm: clear to auscultation bilaterally Abd: soft, obese, BS present Ext: trace edema of lower extremities, no edema of hands, wrists and non tender to palpation of wrists, knees, and lower extremities Neuro: alert and oriented X3, strength and sensation to light touch equal in bilateral upper and lower extremities  Assessment & Plan:  Discussed with Dr. Daryll Davila Gout flares-- started allopurinol with medrol dosepack Hypokalemia--bmet today, may need replacement in setting of diuretics

## 2014-06-05 NOTE — Assessment & Plan Note (Signed)
BP Readings from Last 3 Encounters:  06/04/14 132/69  05/25/14 127/75  05/25/14 142/87   Lab Results  Component Value Date   NA 138 06/04/2014   K 3.6 06/04/2014   CREATININE 0.96 06/04/2014   Assessment: Blood pressure control: controlled Progress toward BP goal:  at goal  Plan: Medications:  continue current medications torsemide 60mg  daily, lisinopril 5mg , digoxin 252mcg daily, and coreg 25mg  bid Educational resources provided:   Self management tools provided:   Other plans: may consider adjusting diuretic dose if gout flares continue

## 2014-06-05 NOTE — Assessment & Plan Note (Addendum)
Chronic since 2012, has combined CHF and recent gout flares  Currently may be increased in setting of recent steroids for gout flares. Afebrile at this time with no obvious signs of infection.  CXR on 12/7 was negative for infiltrate.   -will continue to monitor and trend once steroids have completed as well -will also monitor cbc, and check iron panel and consider ESR on follow up

## 2014-06-05 NOTE — Assessment & Plan Note (Signed)
Has HF appt early January. Currently on torsemide, digoxin, and coreg, and lisinopril.  His weight used to be around 350lb's however since September he has been in the 380s. Most recently down 7 pounds since last ED visit on 05/20/14. Reports feeling well today with no chest pain or baseline SOB. Lower extremity swelling is much improved per patient as well. He is urinating frequently with current dose of torsemide.   -follow up with HF team as scheduled and adjustment of diuresis as they feel is needed (torsemide could be contributing to recent gout flares, however, hopefully allopurinol will help) -bmet--K was down to 3.3 on 05/20/14 improved to 3.6 on this visit and renal function stable -will try to have statin discussion on next visit and may consider checking lipid panel as well -consider repeat echo? Last one was 2013.

## 2014-06-05 NOTE — Assessment & Plan Note (Addendum)
Started allopurinol today 100mg  daily along with medrol dose pack to hopefully prevent flare.  Will titrate up allopurinol weekly and recheck level in 2 weeks He denies eating a lot of red meat or alcohol. Gout flares could be in setting of torsemide as well

## 2014-06-09 NOTE — Progress Notes (Signed)
Internal Medicine Clinic Attending  Case discussed with Dr. Qureshi soon after the resident saw the patient.  We reviewed the resident's history and exam and pertinent patient test results.  I agree with the assessment, diagnosis, and plan of care documented in the resident's note. 

## 2014-06-11 ENCOUNTER — Encounter: Payer: Self-pay | Admitting: *Deleted

## 2014-06-11 ENCOUNTER — Telehealth (HOSPITAL_COMMUNITY): Payer: Self-pay | Admitting: Vascular Surgery

## 2014-06-11 NOTE — Telephone Encounter (Signed)
Refill Lisinopril

## 2014-06-12 ENCOUNTER — Other Ambulatory Visit: Payer: Self-pay | Admitting: *Deleted

## 2014-06-12 ENCOUNTER — Telehealth: Payer: Self-pay | Admitting: Internal Medicine

## 2014-06-12 DIAGNOSIS — I5022 Chronic systolic (congestive) heart failure: Secondary | ICD-10-CM

## 2014-06-12 MED ORDER — LISINOPRIL 5 MG PO TABS: 5.0000 mg | ORAL_TABLET | Freq: Every day | ORAL | Status: DC

## 2014-06-12 NOTE — Telephone Encounter (Signed)
I called and spoke with Mr. Kilker this afternoon in regards to adjusting his allopurinol dose as planned to 200mg  daily.  However, Mr. Coats does not wish to increase the dose of his allopurinol at this time.  He wishes to keep it at 100mg  daily and will slowly will titrate up when he is ready or when we check the level next week he said he may want to increase the dose then. He says currently, he has no issues with gout and is tolerating the medicine well. He will call the clinic for an appt next week unless they are able to call him first for a follow up appointment.

## 2014-06-12 NOTE — Telephone Encounter (Signed)
Addressed by other means  

## 2014-06-13 ENCOUNTER — Ambulatory Visit (HOSPITAL_COMMUNITY): Payer: Medicare Other

## 2014-06-20 ENCOUNTER — Ambulatory Visit (HOSPITAL_COMMUNITY)
Admission: RE | Admit: 2014-06-20 | Discharge: 2014-06-20 | Disposition: A | Discharge status: Home / Self Care | Payer: Medicare Other | Source: Ambulatory Visit | Attending: Internal Medicine | Admitting: Internal Medicine

## 2014-06-20 ENCOUNTER — Encounter (HOSPITAL_COMMUNITY): Payer: Self-pay

## 2014-06-20 VITALS — BP 116/78 | HR 96 | Wt 382.0 lb

## 2014-06-20 DIAGNOSIS — R9431 Abnormal electrocardiogram [ECG] [EKG]: Secondary | ICD-10-CM | POA: Diagnosis not present

## 2014-06-20 DIAGNOSIS — I10 Essential (primary) hypertension: Secondary | ICD-10-CM | POA: Diagnosis not present

## 2014-06-20 DIAGNOSIS — Z79899 Other long term (current) drug therapy: Secondary | ICD-10-CM | POA: Diagnosis not present

## 2014-06-20 DIAGNOSIS — I420 Dilated cardiomyopathy: Secondary | ICD-10-CM | POA: Insufficient documentation

## 2014-06-20 DIAGNOSIS — I5022 Chronic systolic (congestive) heart failure: Secondary | ICD-10-CM

## 2014-06-20 DIAGNOSIS — Z95810 Presence of automatic (implantable) cardiac defibrillator: Secondary | ICD-10-CM | POA: Insufficient documentation

## 2014-06-20 DIAGNOSIS — E785 Hyperlipidemia, unspecified: Secondary | ICD-10-CM | POA: Diagnosis not present

## 2014-06-20 DIAGNOSIS — I5042 Chronic combined systolic (congestive) and diastolic (congestive) heart failure: Secondary | ICD-10-CM

## 2014-06-20 DIAGNOSIS — M25561 Pain in right knee: Secondary | ICD-10-CM | POA: Diagnosis not present

## 2014-06-20 DIAGNOSIS — M25562 Pain in left knee: Secondary | ICD-10-CM | POA: Diagnosis not present

## 2014-06-20 DIAGNOSIS — M109 Gout, unspecified: Secondary | ICD-10-CM | POA: Diagnosis not present

## 2014-06-20 DIAGNOSIS — I059 Rheumatic mitral valve disease, unspecified: Secondary | ICD-10-CM

## 2014-06-20 MED ORDER — SPIRONOLACTONE 25 MG PO TABS: 12.5000 mg | ORAL_TABLET | Freq: Every day | ORAL | Status: DC

## 2014-06-20 MED ORDER — LISINOPRIL 10 MG PO TABS: 10.0000 mg | ORAL_TABLET | Freq: Every day | ORAL | Status: DC

## 2014-06-20 MED ORDER — OXYCODONE-ACETAMINOPHEN 5-325 MG PO TABS
ORAL_TABLET | ORAL | Status: AC
  Filled 2014-06-20: qty 2

## 2014-06-20 NOTE — Progress Notes (Signed)
Patient ID: William Davila, male   DOB: [birth year], 49 y.o.   MRN: [id number] Ortho: Dr Nori Riis  PCP: Dr Mechele Claude.  EP: Dr Caryl Comes  HPI: William Davila  is a 49 year old AA male with systolic heart failure (IP38-25% March 2013 ), cardiolmyopathy diagnosed 2001, hyperlipidemia, HTN, and obesity. S/P ST single chamber ICD 10/2011. Intolerant Bidil due to headaches.    Discharged from [facility] 05/10/2011 Discharge weight 328.  Massive volume over load and low output. Cardiac output increased inotropes. Medications initiated ace, beta blocker , digoxin, and torsemide.   He is returns for follow up. Denies SOB/PND/Orthopnea.  No shocks.  He has difficulty with steps due to bilateral knee pain. Not weighing at home.  Big appetite. Complaint with medications.Lives with grandfather.    ECHO 08/2011 EF 20-25% ECHO 06/20/14 EF 25-30% RV normal   Labs 06/04/14 K 3.6 Creatinine 0.96.   ROS: All systems negative except as listed in HPI, PMH and Problem List.  Past Medical History  Diagnosis Date  . Chronic systolic heart failure     Diagnosed 2001; prior patient of Dr. Linard Millers with [name] - discharged from practice  . Dilated cardiomyopathy     LVEF 10-20%  . Hyperlipidemia   . Essential hypertension, benign   . Morbid obesity   . Gout   . 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 Arthritis     knee's  . VT (ventricular tachycardia) 06/07/2011    Current Outpatient Prescriptions  Medication Sig Dispense Refill  . allopurinol (ZYLOPRIM) 100 MG tablet Take 1 tablet (100 mg total) by mouth daily. 30 tablet 1  . carvedilol (COREG) 25 MG tablet Take 1 tablet (25 mg total) by mouth 2 (two) times daily. 60 tablet 3  . digoxin (LANOXIN) 0.25 MG tablet Take 1 tablet (250 mcg total) by mouth daily. 30 tablet 3  . HYDROcodone-acetaminophen (NORCO/VICODIN) 5-325 MG per tablet Take 2 tablets by mouth every 4 (four) hours as needed for moderate pain or severe pain. 12 tablet 0  . lisinopril  (PRINIVIL,ZESTRIL) 5 MG tablet Take 1 tablet (5 mg total) by mouth daily. 30 tablet 6  . naproxen (NAPROSYN) 500 MG tablet Take 1 tablet (500 mg total) by mouth 2 (two) times daily with a meal. 30 tablet 0  . torsemide (DEMADEX) 20 MG tablet Take 3 tablets (60 mg total) by mouth daily. 90 tablet 6  . acetaminophen (TYLENOL) 500 MG tablet Take 1,000 mg by mouth every 6 (six) hours as needed. For pain     No current facility-administered medications for this encounter.     PHYSICAL EXAM: Filed Vitals:   06/20/14 1443  BP: 116/78  Pulse: 101   Weight change:  General:  Well appearing. No resp difficulty HEENT: normal Neck: supple. JVP 5-6. Carotids 2+ bilaterally; no bruits. No lymphadenopathy or thryomegaly appreciated. Cor: PMI normal. Regular rate & rhythm. No rubs, S3  or murmurs. Lungs: clear Abdomen: obese, soft, nontender, nondistended. No hepatosplenomegaly. No bruits or masses. Good bowel sounds. Extremities: no cyanosis, clubbing, rash, edema Neuro: alert & orientedx3, cranial nerves grossly intact. Moves all 4 extremities w/o difficulty. Affect pleasant.      ASSESSMENT & PLAN:  1. Chronic Systolic heart Failure- S/P ST Jude ICD 10/2011 ECHO today EF 25-30% NYHA II. Volume status stable. Continue torsemide 60 mg daily On goal carvedilol 25 mg twice a day. Continue digoxin 0.25 mg daily (dig level 0.9 05/2013)  Increase lisinopril to 10 mg daily and  add 12.5 mg spironolactone daily. Check BMET in 10 days. Continue lisinopril 5 mg daily. Reinforced daily weights, low salt food Next visit can consider corlanor 2.5 mg daily  Will refer [facility] HF program.  Needs to weight and record daily, limiting fluid intake to < 2 liters per day and make low slat food choices. .  Refe 2. Obesity- Weight trending up. Needs to cut back on his portions.   Follow up in 3 months   CLEGG,AMY NP-C  2:57 PM  Patient seen and examined with Darrick Grinder, NP. We discussed all aspects of the  encounter. I agree with the assessment and plan as stated above.   Echo reviewed personally. LVEF in 25-30% range. RV normal. NYHA II. Agree with increasing lisinopril and adding spiro 12.5. Watch labs. Long talk about possible need for advanced therapies down the road and and the need to be more compliant with diet and try to lose weight.   Daniel Bensimhon,MD 4:30 PM

## 2014-06-20 NOTE — Patient Instructions (Signed)
INCREASE Lisinopril to 10mg  once daily.  START Spironolactone 12.5 (1/2 tablet) once daily.  Return on January 20th for lab work anytime between 8am-5pm.  Follow up in 2 months.  Do the following things EVERYDAY: 1) Weigh yourself in the morning before breakfast. Write it down and keep it in a log. 2) Take your medicines as prescribed 3) Eat low salt foods-Limit salt (sodium) to 2000 mg per day.  4) Stay as active as you can everyday 5) Limit all fluids for the day to less than 2 liters

## 2014-06-20 NOTE — Progress Notes (Signed)
  Echocardiogram 2D Echocardiogram has been performed.  William Davila 06/20/2014, 2:42 PM

## 2014-06-24 ENCOUNTER — Telehealth (HOSPITAL_COMMUNITY): Payer: Self-pay | Admitting: Vascular Surgery

## 2014-06-24 NOTE — Telephone Encounter (Signed)
Refill Torsemide sent [employer].. Please advise

## 2014-06-25 MED ORDER — TORSEMIDE 20 MG PO TABS: 60.0000 mg | ORAL_TABLET | Freq: Every day | ORAL | Status: DC

## 2014-07-03 ENCOUNTER — Ambulatory Visit (HOSPITAL_COMMUNITY)
Admission: RE | Admit: 2014-07-03 | Discharge: 2014-07-03 | Disposition: A | Discharge status: Home / Self Care | Payer: Medicare Other | Source: Ambulatory Visit | Attending: Internal Medicine | Admitting: Internal Medicine

## 2014-07-03 DIAGNOSIS — I5042 Chronic combined systolic (congestive) and diastolic (congestive) heart failure: Secondary | ICD-10-CM | POA: Insufficient documentation

## 2014-07-03 DIAGNOSIS — I5022 Chronic systolic (congestive) heart failure: Secondary | ICD-10-CM

## 2014-07-03 LAB — BASIC METABOLIC PANEL
ANION GAP: 11 (ref 5–15)
BUN: 10 mg/dL (ref 6–23)
CO2: 29 mmol/L (ref 19–32)
Calcium: 9.6 mg/dL (ref 8.4–10.5)
Chloride: 100 mEq/L (ref 96–112)
Creatinine, Ser: 1.04 mg/dL (ref 0.50–1.35)
GFR calc Af Amer: >90 mL/min (ref >90)
GFR calc non Af Amer: 84 mL/min — ABNORMAL LOW (ref >90)
Glucose, Bld: 121 mg/dL — ABNORMAL HIGH (ref 70–99)
Potassium: 3.7 mmol/L (ref 3.5–5.1)
Sodium: 140 mmol/L (ref 135–145)

## 2014-07-12 ENCOUNTER — Encounter: Payer: Self-pay | Admitting: *Deleted

## 2014-07-16 ENCOUNTER — Encounter: Payer: Medicare Other | Admitting: Internal Medicine

## 2014-07-23 ENCOUNTER — Encounter: Payer: Self-pay | Admitting: Internal Medicine

## 2014-07-23 ENCOUNTER — Ambulatory Visit (INDEPENDENT_AMBULATORY_CARE_PROVIDER_SITE_OTHER): Payer: Medicare Other | Admitting: Internal Medicine

## 2014-07-23 VITALS — BP 111/71 | HR 99 | Temp 97.5°F | Ht 70.0 in | Wt 387.1 lb

## 2014-07-23 DIAGNOSIS — I5042 Chronic combined systolic (congestive) and diastolic (congestive) heart failure: Secondary | ICD-10-CM

## 2014-07-23 DIAGNOSIS — D649 Anemia, unspecified: Secondary | ICD-10-CM

## 2014-07-23 DIAGNOSIS — R7303 Prediabetes: Secondary | ICD-10-CM

## 2014-07-23 DIAGNOSIS — R7309 Other abnormal glucose: Secondary | ICD-10-CM

## 2014-07-23 DIAGNOSIS — E790 Hyperuricemia without signs of inflammatory arthritis and tophaceous disease: Secondary | ICD-10-CM

## 2014-07-23 DIAGNOSIS — M25561 Pain in right knee: Secondary | ICD-10-CM

## 2014-07-23 DIAGNOSIS — I10 Essential (primary) hypertension: Secondary | ICD-10-CM

## 2014-07-23 DIAGNOSIS — Z0000 Encounter for general adult medical examination without abnormal findings: Secondary | ICD-10-CM

## 2014-07-23 DIAGNOSIS — D72829 Elevated white blood cell count, unspecified: Secondary | ICD-10-CM

## 2014-07-23 DIAGNOSIS — Z6841 Body Mass Index (BMI) 40.0 and over, adult: Secondary | ICD-10-CM

## 2014-07-23 LAB — CBC WITH DIFFERENTIAL/PLATELET
BASOS ABS: 0 10*3/uL (ref 0.0–0.1)
BASOS PCT: 0 % (ref 0–1)
Eosinophils Absolute: 0.3 10*3/uL (ref 0.0–0.7)
Eosinophils Relative: 2 % (ref 0–5)
HEMATOCRIT: 37.2 % — AB (ref 39.0–52.0)
Hemoglobin: 12.6 g/dL — ABNORMAL LOW (ref 13.0–17.0)
Lymphocytes Relative: 13 % (ref 12–46)
Lymphs Abs: 1.8 10*3/uL (ref 0.7–4.0)
MCH: 28.6 pg (ref 26.0–34.0)
MCHC: 33.9 g/dL (ref 30.0–36.0)
MCV: 84.5 fL (ref 78.0–100.0)
MONO ABS: 1 10*3/uL (ref 0.1–1.0)
MONOS PCT: 7 % (ref 3–12)
MPV: 9.4 fL (ref 8.6–12.4)
Neutro Abs: 10.7 10*3/uL — ABNORMAL HIGH (ref 1.7–7.7)
Neutrophils Relative %: 78 % — ABNORMAL HIGH (ref 43–77)
PLATELETS: 518 10*3/uL — AB (ref 150–400)
RBC: 4.4 MIL/uL (ref 4.22–5.81)
RDW: 15.8 % — AB (ref 11.5–15.5)
WBC: 13.7 10*3/uL — ABNORMAL HIGH (ref 4.0–10.5)

## 2014-07-23 LAB — BASIC METABOLIC PANEL WITH GFR
BUN: 17 mg/dL (ref 6–23)
CO2: 31 mEq/L (ref 19–32)
Calcium: 9.6 mg/dL (ref 8.4–10.5)
Chloride: 99 mEq/L (ref 96–112)
Creat: 1.09 mg/dL (ref 0.50–1.35)
GFR, Est Non African American: 81 mL/min
Glucose, Bld: 99 mg/dL (ref 70–99)
Potassium: 4.2 mEq/L (ref 3.5–5.3)
SODIUM: 139 meq/L (ref 135–145)

## 2014-07-23 LAB — IRON AND TIBC
%SAT: 12 % — AB (ref 20–55)
IRON: 40 ug/dL — AB (ref 42–165)
TIBC: 323 ug/dL (ref 215–435)
UIBC: 283 ug/dL (ref 125–400)

## 2014-07-23 LAB — URIC ACID: Uric Acid, Serum: 11.1 mg/dL — ABNORMAL HIGH (ref 4.0–7.8)

## 2014-07-23 MED ORDER — METHYLPREDNISOLONE 4 MG PO KIT: PACK | ORAL | Status: DC

## 2014-07-23 MED ORDER — NAPROXEN 500 MG PO TABS: 500.0000 mg | ORAL_TABLET | Freq: Two times a day (BID) | ORAL | Status: DC

## 2014-07-23 NOTE — Progress Notes (Signed)
Subjective:   Patient ID: William Davila male   DOB: 09/23/1965 49 y.o.   MRN: [id number]  HPI: [name] is a 49 y.o. male with systolic and diastolic CHF (recent echo 06/2014: EF 25%to 30%. Diffuse hypokinesis. grade 3 diastolic dysfunction). PA peak pressure: 33 mm Hg (S)) who presents to opc today for routine follow up visit.   Combined CHF: seen by HF team 06/2014.  NYHA II, increased Lisinopril to 10mg  and added Spironolactone 12.5mg  daily, continued on torsemide 60mg , digoxin 0.25mg , and carvedilol 25mg  bid. Reinforced need for daily weights and working on weight loss and decreased salt intake. Weight up 5 pounds since last visit in January. He claims to have been working on his diet and salt intake, but ate pizza on Sunday and eats sausage for breakfast at times.   Anemia--Hb trending down since 2013 (15-->12.6). Refused rectal exam but did agrees for stool cards.   Elevated uric acid with hx of gout--started on allopurinol 100mg  daily 05/2014. Uric acid 13 03/2014. Recheck today.   R knee swelling and pain--since Sunday. Walking with crutches today to help take some pressure of the knee. Does not feel like when he normally has gout, however pain and swelling improved after taking 600mg  ibuprofen last night and this morning.  He has been taking allopurinol 100mg  daily. Denies any recent alcohol use.   Past Medical History  Diagnosis Date  . Chronic systolic heart failure     Diagnosed 2001; prior patient of Dr. Linard Millers with [name] - discharged from practice  . Dilated cardiomyopathy     LVEF 10-20%  . Hyperlipidemia   . Essential hypertension, benign   . Morbid obesity   . Gout   . 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 Arthritis     knee's  . VT (ventricular tachycardia) 06/07/2011   Current Outpatient Prescriptions  Medication Sig Dispense Refill  . acetaminophen (TYLENOL) 500 MG tablet Take 1,000 mg by mouth every 6 (six) hours as needed. For  pain    . allopurinol (ZYLOPRIM) 100 MG tablet Take 1 tablet (100 mg total) by mouth daily. 30 tablet 1  . carvedilol (COREG) 25 MG tablet Take 1 tablet (25 mg total) by mouth 2 (two) times daily. 60 tablet 3  . digoxin (LANOXIN) 0.25 MG tablet Take 1 tablet (250 mcg total) by mouth daily. 30 tablet 3  . HYDROcodone-acetaminophen (NORCO/VICODIN) 5-325 MG per tablet Take 2 tablets by mouth every 4 (four) hours as needed for moderate pain or severe pain. 12 tablet 0  . lisinopril (PRINIVIL,ZESTRIL) 10 MG tablet Take 1 tablet (10 mg total) by mouth daily. 90 tablet 3  . naproxen (NAPROSYN) 500 MG tablet Take 1 tablet (500 mg total) by mouth 2 (two) times daily with a meal. 30 tablet 0  . spironolactone (ALDACTONE) 25 MG tablet Take 0.5 tablets (12.5 mg total) by mouth daily. 90 tablet 3  . torsemide (DEMADEX) 20 MG tablet Take 3 tablets (60 mg total) by mouth daily. 90 tablet 6   No current facility-administered medications for this visit.   Family History  Problem Relation Age of Onset  . Hypertension Mother    History   Social History  . Marital Status: Single    Spouse Name: N/A    Number of Children: N/A  . Years of Education: N/A   Social History Main Topics  . Smoking status: Never Smoker   . Smokeless tobacco: Never Used  . Alcohol Use:  No  . Drug Use: No  . Sexual Activity: Yes   Other Topics Concern  . Not on file   Social History Narrative   Review of Systems:  Constitutional:  Denies fever, chills  HEENT:  Denies congestion  Respiratory:  Denies SOB or DOE  Cardiovascular:  Denies chest pain  Gastrointestinal:  Denies nausea, vomiting, abdominal pain, blood in stool   Genitourinary:  Denies dysuria  Musculoskeletal:  R knee pain, lower extremity swelling, ankle pain when walking far distances  Skin:  Dry skin  Neurological:  Denies headaches   Objective:  Physical Exam: Filed Vitals:   07/23/14 1423  BP: 111/71  Pulse: 99  Temp: 97.5 F (36.4 C)    TempSrc: Oral  Height: 5\' 10"  (1.778 m)  Weight: 387 lb 1.6 oz (175.587 kg)  SpO2: 99%   Vitals reviewed. General: sitting in chair, NAD HEENT: EOMI Cardiac: RRR but distant due to body habitus Pulm: clear to auscultation bilaterally Abd: soft, obese, nontender, BS present Ext: +1 pitting edema of lower extremities, -tenderness to palpation of lower extremities, R knee slightly edematous compared to left and tenderness to palpation lower anterior portion of knee. Mild palpable joint effusion of right knee. Warm to touch b/l maybe slightly warmer on right. No erythema.  Neuro: alert and oriented X3, cranial nerves II-XII grossly intact, strength and sensation to light touch equal in bilateral upper and lower extremities  Assessment & Plan:  Discussed with Dr. Dareen Piano

## 2014-07-23 NOTE — Patient Instructions (Addendum)
General Instructions:  Please bring your medicines with you each time you come to clinic.  Medicines may include prescription medications, over-the-counter medications, herbal remedies, eye drops, vitamins, or other pills.  Please start naproxen 500mg  twice a day for the next 5-7 days to see if we can resolve your knee pain/gout flare. Please also start the medrol dose pack.   Your weight is back up again today, please work on  Your diet and portion control and salt and fluid intake as we discussed.   Consider getting the flu vaccine and please send back the stool cards to assess for any possible rectal bleeding  If you have any worsening of knee pain or swelling, fever, or chills, call us right away or go directly to the emergency room  Weigh yourself daily! If your weight continues to go up, call us or HF team and let us know. If you develop shortness of breath or chest pain, call us right away and go to emergency room  We are monitoring your hemoglobin. If you notice any blood in urine or stool please let me know right away.   Progress Toward Treatment Goals:  Treatment Goal 07/23/2014  Blood pressure at goal    Self Care Goals & Plans:  Self Care Goal 07/23/2014  Manage my medications take my medicines as prescribed; bring my medications to every visit; refill my medications on time  Eat healthy foods eat more vegetables; eat foods that are low in salt; eat baked foods instead of fried foods  Be physically active find an activity I enjoy; take a walk every day    No flowsheet data found.   Care Management & Community Referrals:  No flowsheet data found.   Methylprednisolone tablets What is this medicine? METHYLPREDNISOLONE (meth ill pred NISS oh lone) is a corticosteroid. It is commonly used to treat inflammation of the skin, joints, lungs, and other organs. Common conditions treated include asthma, allergies, and arthritis. It is also used for other conditions, such as blood  disorders and diseases of the adrenal glands. This medicine may be used for other purposes; ask your health care provider or pharmacist if you have questions. COMMON BRAND NAME(S): Medrol, Medrol Dosepak What should I tell my health care provider before I take this medicine? They need to know if you have any of these conditions: -Cushing's syndrome -diabetes -glaucoma -heart problems or disease -high blood pressure -infection such as herpes, measles, tuberculosis, or chickenpox -kidney disease -liver disease -mental problems -myasthenia gravis -osteoporosis -seizures -stomach ulcer or intestine disease including colitis and diverticulitis -thyroid problem -an unusual or allergic reaction to lactose, methylprednisolone, other medicines, foods, dyes, or preservatives -pregnant or trying to get pregnant -breast-feeding How should I use this medicine? Take this medicine by mouth with a drink of water. Follow the directions on the prescription label. Take it with food or milk to avoid stomach upset. If you are taking this medicine once a day, take it in the morning. Do not take more medicine than you are told to take. Do not suddenly stop taking your medicine because you may develop a severe reaction. Your doctor will tell you how much medicine to take. If your doctor wants you to stop the medicine, the dose may be slowly lowered over time to avoid any side effects. Talk to your pediatrician regarding the use of this medicine in children. Special care may be needed. Overdosage: If you think you have taken too much of this medicine contact a poison control  center or emergency room at once. NOTE: This medicine is only for you. Do not share this medicine with others. What if I miss a dose? If you miss a dose, take it as soon as you can. If it is almost time for your next dose, talk to your doctor or health care professional. You may need to miss a dose or take an extra dose. Do not take double or  extra doses without advice. What may interact with this medicine? Do not take this medicine with any of the following medications: -mifepristone This medicine may also interact with the following medications: -tacrolimus -vaccines -warfarin This list may not describe all possible interactions. Give your health care provider a list of all the medicines, herbs, non-prescription drugs, or dietary supplements you use. Also tell them if you smoke, drink alcohol, or use illegal drugs. Some items may interact with your medicine. What should I watch for while using this medicine? Visit your doctor or health care professional for regular checks on your progress. If you are taking this medicine for a long time, carry an identification card with your name and address, the type and dose of your medicine, and your doctor's name and address. The medicine may increase your risk of getting an infection. Stay away from people who are sick. Tell your doctor or health care professional if you are around anyone with measles or chickenpox. If you are going to have surgery, tell your doctor or health care professional that you have taken this medicine within the last twelve months. Ask your doctor or health care professional about your diet. You may need to lower the amount of salt you eat. The medicine can increase your blood sugar. If you are a diabetic check with your doctor if you need help adjusting the dose of your diabetic medicine. What side effects may I notice from receiving this medicine? Side effects that you should report to your doctor or health care professional as soon as possible: -allergic reactions like skin rash, itching or hives, swelling of the face, lips, or tongue -eye pain, decreased or blurred vision, or bulging eyes -fever, sore throat, sneezing, cough, or other signs of infection, wounds that will not heal -increased thirst -mental depression, mood swings, mistaken feelings of self  importance or of being mistreated -pain in hips, back, ribs, arms, shoulders, or legs -swelling of the ankles, feet, hands -trouble passing urine or change in the amount of urine Side effects that usually do not require medical attention (report to your doctor or health care professional if they continue or are bothersome): -confusion, excitement, restlessness -headache -nausea, vomiting -skin problems, acne, thin and shiny skin -weight gain This list may not describe all possible side effects. Call your doctor for medical advice about side effects. You may report side effects to FDA at 1-800-FDA-1088. Where should I keep my medicine? Keep out of the reach of children. Store at room temperature between 20 and 25 degrees C (68 and 77 degrees F). Throw away any unused medicine after the expiration date. NOTE: This sheet is a summary. It may not cover all possible information. If you have questions about this medicine, talk to your doctor, pharmacist, or health care provider.  2015, Elsevier/Gold Standard. (2012-02-29 11:38:34)  Naproxen and naproxen sodium oral immediate-release tablets What is this medicine? NAPROXEN (na PROX en) is a non-steroidal anti-inflammatory drug (NSAID). It is used to reduce swelling and to treat pain. This medicine may be used for dental pain, headache, or painful  monthly periods. It is also used for painful joint and muscular problems such as arthritis, tendinitis, bursitis, and gout. This medicine may be used for other purposes; ask your health care provider or pharmacist if you have questions. COMMON BRAND NAME(S): Aflaxen, Aleve, Aleve Arthritis, All Day Relief, Anaprox, Anaprox DS, Naprosyn What should I tell my health care provider before I take this medicine? They need to know if you have any of these conditions: -asthma -cigarette smoker -drink more than 3 alcohol containing drinks a day -heart disease or circulation problems such as heart failure or leg  edema (fluid retention) -high blood pressure -kidney disease -liver disease -stomach bleeding or ulcers -an unusual or allergic reaction to naproxen, aspirin, other NSAIDs, other medicines, foods, dyes, or preservatives -pregnant or trying to get pregnant -breast-feeding How should I use this medicine? Take this medicine by mouth with a glass of water. Follow the directions on the prescription label. Take it with food if your stomach gets upset. Try to not lie down for at least 10 minutes after you take it. Take your medicine at regular intervals. Do not take your medicine more often than directed. Long-term, continuous use may increase the risk of heart attack or stroke. A special MedGuide will be given to you by the pharmacist with each prescription and refill. Be sure to read this information carefully each time. Talk to your pediatrician regarding the use of this medicine in children. Special care may be needed. Overdosage: If you think you have taken too much of this medicine contact a poison control center or emergency room at once. NOTE: This medicine is only for you. Do not share this medicine with others. What if I miss a dose? If you miss a dose, take it as soon as you can. If it is almost time for your next dose, take only that dose. Do not take double or extra doses. What may interact with this medicine? -alcohol -aspirin -cidofovir -diuretics -lithium -methotrexate -other drugs for inflammation like ketorolac or prednisone -pemetrexed -probenecid -warfarin This list may not describe all possible interactions. Give your health care provider a list of all the medicines, herbs, non-prescription drugs, or dietary supplements you use. Also tell them if you smoke, drink alcohol, or use illegal drugs. Some items may interact with your medicine. What should I watch for while using this medicine? Tell your doctor or health care professional if your pain does not get better. Talk to  your doctor before taking another medicine for pain. Do not treat yourself. This medicine does not prevent heart attack or stroke. In fact, this medicine may increase the chance of a heart attack or stroke. The chance may increase with longer use of this medicine and in people who have heart disease. If you take aspirin to prevent heart attack or stroke, talk with your doctor or health care professional. Do not take other medicines that contain aspirin, ibuprofen, or naproxen with this medicine. Side effects such as stomach upset, nausea, or ulcers may be more likely to occur. Many medicines available without a prescription should not be taken with this medicine. This medicine can cause ulcers and bleeding in the stomach and intestines at any time during treatment. Do not smoke cigarettes or drink alcohol. These increase irritation to your stomach and can make it more susceptible to damage from this medicine. Ulcers and bleeding can happen without warning symptoms and can cause death. You may get drowsy or dizzy. Do not drive, use machinery, or do anything  that needs mental alertness until you know how this medicine affects you. Do not stand or sit up quickly, especially if you are an older patient. This reduces the risk of dizzy or fainting spells. This medicine can cause you to bleed more easily. Try to avoid damage to your teeth and gums when you brush or floss your teeth. What side effects may I notice from receiving this medicine? Side effects that you should report to your doctor or health care professional as soon as possible: -black or bloody stools, blood in the urine or vomit -blurred vision -chest pain -difficulty breathing or wheezing -nausea or vomiting -severe stomach pain -skin rash, skin redness, blistering or peeling skin, hives, or itching -slurred speech or weakness on one side of the body -swelling of eyelids, throat, lips -unexplained weight gain or swelling -unusually weak or  tired -yellowing of eyes or skin Side effects that usually do not require medical attention (report to your doctor or health care professional if they continue or are bothersome): -constipation -headache -heartburn This list may not describe all possible side effects. Call your doctor for medical advice about side effects. You may report side effects to FDA at 1-800-FDA-1088. Where should I keep my medicine? Keep out of the reach of children. Store at room temperature between 15 and 30 degrees C (59 and 86 degrees F). Keep container tightly closed. Throw away any unused medicine after the expiration date. NOTE: This sheet is a summary. It may not cover all possible information. If you have questions about this medicine, talk to your doctor, pharmacist, or health care provider.  2015, Elsevier/Gold Standard. (2009-06-02 20:10:16)

## 2014-07-23 NOTE — Assessment & Plan Note (Addendum)
Likely another acute gout flare that is improving with ibuprofen that he started yesterday. Reports knee has been aspirated and drained in the past with sports medicine, will try to review records and see if crystal analysis was ever done. Doubt septic joint at this time, no erythema, fever, or chills. Denies trauma to knee.   -start naproxen 500mg  po bid x7 days if needed -medrol dose pack -continue allopurinol at current dose, will need to titrate up once acute flare has resolved -follow up within 2 weeks if possible or sooner if no improvement or worsening -walking with crutches support for now -if pain still present or worsening, may consider imaging on follow up vs. Return to sports medicine -dietary counseling provided again, but his diuretics are likely contributing to gout flares

## 2014-07-23 NOTE — Assessment & Plan Note (Signed)
Uric acid check today, although he has acute gout flare of R knee. No adjust to allopurinol dose until flare has resolved

## 2014-07-23 NOTE — Assessment & Plan Note (Signed)
BP Readings from Last 3 Encounters:  07/23/14 111/71  06/04/14 132/69  05/25/14 127/75    Lab Results  Component Value Date   NA 140 07/03/2014   K 3.7 07/03/2014   CREATININE 1.04 07/03/2014    Assessment: Blood pressure control:   controlled Progress toward BP goal:    at goal Comments: compliant with medications  Plan: Medications:  continue current medications coreg 25mg  bid, digoxin 0.25mg  daily, lisinopril 10mg , spironolactone 12.5mg  daily , and torsemide 60mg  daily Other plans: bmet today

## 2014-07-24 DIAGNOSIS — Z1211 Encounter for screening for malignant neoplasm of colon: Secondary | ICD-10-CM | POA: Insufficient documentation

## 2014-07-24 LAB — HEMOGLOBIN A1C
Hgb A1c MFr Bld: 6.9 % — ABNORMAL HIGH (ref <5.7)
Mean Plasma Glucose: 151 mg/dL — ABNORMAL HIGH (ref <117)

## 2014-07-24 LAB — FERRITIN: Ferritin: 343 ng/mL — ABNORMAL HIGH (ref 22–322)

## 2014-07-24 NOTE — Assessment & Plan Note (Signed)
Continue to put on weight. We spent a lot of time discussing diet and lifestyle modifications again today that he says he is working on. He did talk to the [facility] but he needs to speak to medicare for silver sneakers before hand.

## 2014-07-24 NOTE — Assessment & Plan Note (Signed)
Recently seen by HF team last month. Weight up today from last visit but denies any SOB or DOE. He is working on his diet but needs more adjustments. I discussed possible nutrition consult on follow up visit if he continues to gain weight or struggle with his diet.   On ACE(dose recently increased), torsemide, BB, digoxin, and recently started on aldactone as well bmet today--trend renal function and monitor electrolytes Follow up with HF team April Daily weights Advised to let us or HF team know if weight continuing to trend up or develops worsening edema, SOB, DOE, or chest pain right away

## 2014-07-24 NOTE — Assessment & Plan Note (Signed)
Cbc shows wbc trending down. Continue to monitor for now. Currently another acute gout flare

## 2014-07-24 NOTE — Assessment & Plan Note (Signed)
Refuses flu vaccine

## 2014-07-24 NOTE — Assessment & Plan Note (Signed)
Hb stable. Iron panel shows slightly low iron and elevated ferritin. He refused rectal examination today. But was sent home with stool cards to send back which he hopefully will do. He denies any blood in stool or urine.   -will continue to monitor for now

## 2014-07-26 NOTE — Progress Notes (Signed)
INTERNAL MEDICINE TEACHING ATTENDING ADDENDUM - Collette Pescador, MD: I reviewed and discussed at the time of visit with the resident Dr. Qureshi, the patient's medical history, physical examination, diagnosis and results of pertinent tests and treatment and I agree with the patient's care as documented.  

## 2014-07-31 DIAGNOSIS — E669 Obesity, unspecified: Secondary | ICD-10-CM

## 2014-07-31 DIAGNOSIS — E1169 Type 2 diabetes mellitus with other specified complication: Secondary | ICD-10-CM | POA: Insufficient documentation

## 2014-07-31 NOTE — Assessment & Plan Note (Addendum)
HbA1C 6.1 in 2011 and rechecked this visit, elevated to 6.9. He is also obese, however, has had several recent courses of prednisone given his gout flares.  Therefore, would repeat A1C on follow up visit when off steroids and if persistently >6.5 would then confirm diagnosis of DM2. I discussed these results with William Davila on the phone this evening. He voiced understanding and did not have any questions. We discussed getting more aggressive about weight loss, exercise and diet--cutting back on soda's, eating less sweets, and carbs. He has not called medicare yet about silver sneakers program. We will continue this discussion on follow up visit and I think he would be a good candidate for meeting with our CDE and nutrition advise if he agrees.

## 2014-08-06 ENCOUNTER — Ambulatory Visit (INDEPENDENT_AMBULATORY_CARE_PROVIDER_SITE_OTHER): Payer: Medicare Other | Admitting: Internal Medicine

## 2014-08-06 VITALS — BP 137/83 | HR 91 | Temp 98.3°F | Wt 381.5 lb

## 2014-08-06 DIAGNOSIS — M1000 Idiopathic gout, unspecified site: Secondary | ICD-10-CM

## 2014-08-06 DIAGNOSIS — Z0000 Encounter for general adult medical examination without abnormal findings: Secondary | ICD-10-CM

## 2014-08-06 DIAGNOSIS — I10 Essential (primary) hypertension: Secondary | ICD-10-CM

## 2014-08-06 DIAGNOSIS — M1A0690 Idiopathic chronic gout, unspecified knee, without tophus (tophi): Secondary | ICD-10-CM

## 2014-08-06 DIAGNOSIS — D649 Anemia, unspecified: Secondary | ICD-10-CM

## 2014-08-06 MED ORDER — ALLOPURINOL 100 MG PO TABS: 100.0000 mg | ORAL_TABLET | Freq: Every day | ORAL | Status: DC

## 2014-08-06 NOTE — Patient Instructions (Addendum)
I'm glad to you that your knee pain has cleared up. Please continue taking all your medications as directed. Please come back to clinic in one month to get another measure of your uric acid so that we can increase the level of your allopurinol. I have sent a refill of your allopurinol to your pharmacy. Please continue trying to work with the [facility] and the silver sneakers program and trying to get connected to programs to help with weight loss. Please follow-up with your cardiologist for your heart failure as indicated.  General Instructions:   Please bring your medicines with you each time you come to clinic.  Medicines may include prescription medications, over-the-counter medications, herbal remedies, eye drops, vitamins, or other pills.   Progress Toward Treatment Goals:  Treatment Goal 08/06/2014  Blood pressure at goal    Self Care Goals & Plans:  Self Care Goal 07/23/2014  Manage my medications take my medicines as prescribed; bring my medications to every visit; refill my medications on time  Eat healthy foods eat more vegetables; eat foods that are low in salt; eat baked foods instead of fried foods  Be physically active find an activity I enjoy; take a walk every day    No flowsheet data found.   Care Management & Community Referrals:  No flowsheet data found.

## 2014-08-06 NOTE — Assessment & Plan Note (Signed)
Weight is stable though still quite elevated. Patient states that he is currently working with the Silver sneakers program to try to gain access to the [facility] for various physical activities. -Encouraged patient to continue trying to get connected with Silver sneakers and counseled patient on the importance of diet and exercise infecting his blood pressure, heart health, and gout.

## 2014-08-06 NOTE — Assessment & Plan Note (Signed)
Complete blood count and iron studies as indicated below. Patient has a slight normocytic anemia with no evidence of iron deficiency anemia. Patient was given fecal occult blood cards to assess for blood in his stool at last visit. Patient states that he has not obtained any samples. Given that patient has a high ferritin, I have a lower suspicion that he has an iron deficiency anemia secondary to a occult GI bleed. Patient currently denies any symptoms. -Continue to monitor periodically   CBC    Component Value Date/Time   WBC 13.7* 07/23/2014 1452   RBC 4.40 07/23/2014 1452   HGB 12.6* 07/23/2014 1452   HCT 37.2* 07/23/2014 1452   PLT 518* 07/23/2014 1452   MCV 84.5 07/23/2014 1452   MCH 28.6 07/23/2014 1452   MCHC 33.9 07/23/2014 1452   RDW 15.8* 07/23/2014 1452   LYMPHSABS 1.8 07/23/2014 1452   MONOABS 1.0 07/23/2014 1452   EOSABS 0.3 07/23/2014 1452   BASOSABS 0.0 07/23/2014 1452    Iron/TIBC/Ferritin/ %Sat    Component Value Date/Time   IRON 40* 07/23/2014 1452   TIBC 323 07/23/2014 1452   FERRITIN 343* 07/23/2014 1452   IRONPCTSAT 12* 07/23/2014 1452

## 2014-08-06 NOTE — Progress Notes (Signed)
   Subjective:    Patient ID: William Davila, male    DOB: 05-11-1966, 49 y.o.   MRN: [id number]  HPI  Patient is a 49 year old with a history of combined digestive failure, obesity, normocytic anemia, hypertension, hyperlipidemia who presents to clinic for a routine follow-up for his gout.  Please refer to separate problem-list charting for more details.   Review of Systems  Constitutional: Negative for fever and chills.  HENT: Negative for sore throat.   Respiratory: Negative for shortness of breath.   Cardiovascular: Negative for chest pain and palpitations.  Gastrointestinal: Negative for nausea, vomiting, abdominal pain, diarrhea, constipation and blood in stool.  Genitourinary: Negative for dysuria and hematuria.  Skin: Negative for rash.  Neurological: Negative for syncope.  Psychiatric/Behavioral: Negative for suicidal ideas.       Objective:   Physical Exam  Constitutional: He is oriented to person, place, and time. He appears well-developed and well-nourished. No distress.  Morbidly obese  HENT:  Head: Normocephalic and atraumatic.  Eyes: EOM are normal. Pupils are equal, round, and reactive to light. No scleral icterus.  Neck: Normal range of motion. Neck supple. No thyromegaly present.  Cardiovascular: Normal rate and regular rhythm.  Exam reveals no gallop and no friction rub.   No murmur heard. Pulmonary/Chest: Effort normal and breath sounds normal. No respiratory distress. He has no wheezes. He has no rales.  Abdominal: Soft. Bowel sounds are normal. He exhibits no distension. There is no tenderness. There is no rebound.  Musculoskeletal: Normal range of motion. He exhibits no edema.  Neurological: He is alert and oriented to person, place, and time. No cranial nerve deficit.  Skin: No rash noted.          Assessment & Plan:  Please refer to separate problem-list charting for more details.

## 2014-08-06 NOTE — Assessment & Plan Note (Signed)
Patient declining flu vaccine today.

## 2014-08-06 NOTE — Assessment & Plan Note (Signed)
BP Readings from Last 3 Encounters:  08/06/14 137/83  07/23/14 111/71  06/04/14 132/69    Lab Results  Component Value Date   NA 139 07/23/2014   K 4.2 07/23/2014   CREATININE 1.09 07/23/2014    Assessment: Blood pressure control: controlled Progress toward BP goal:  at goal Comments: Patient states that he is compliant with his Coreg 25 mg twice a day, digoxin 250 g daily, lisinopril 10 mg daily, spironolactone 12.5 mg daily and torsemide 60 mg daily.  Plan: Medications:  continue current medications Educational resources provided:   Self management tools provided:   Other plans: None

## 2014-08-06 NOTE — Assessment & Plan Note (Signed)
Patient recently seen 2 weeks ago for an acute gout flare. Patient was prescribed a Medrol Dosepak and was continued on allopurinol 100 mg daily. Patient states that his pain in his knee has completely resolved at this point. Patient states that it resolved several days ago and he states that he has finished his Medrol Dosepak. Uric acid from 2 weeks ago at 11.1. -Would consider up titration of his allopurinol at the next clinic visit. May also consider titration of his allopurinol by 100 mg per week until his uric acid levels are within normal limits. Patient still has a lot of room to increase as the maximum dosage is 800 mg. -Continue allopurinol 100 mg daily for now.

## 2014-08-07 NOTE — Progress Notes (Signed)
INTERNAL MEDICINE TEACHING ATTENDING ADDENDUM - Aaryana Betke, MD: I reviewed and discussed at the time of visit with the resident Dr. Ngo, the patient's medical history, physical examination, diagnosis and results of pertinent tests and treatment and I agree with the patient's care as documented.  

## 2014-08-08 ENCOUNTER — Encounter: Payer: Self-pay | Admitting: *Deleted

## 2014-08-12 ENCOUNTER — Encounter: Payer: Medicare Other | Admitting: Internal Medicine

## 2014-08-19 ENCOUNTER — Telehealth (HOSPITAL_COMMUNITY): Payer: Self-pay | Admitting: Vascular Surgery

## 2014-08-19 DIAGNOSIS — I5022 Chronic systolic (congestive) heart failure: Secondary | ICD-10-CM

## 2014-08-19 MED ORDER — DIGOXIN 250 MCG PO TABS: 250.0000 ug | ORAL_TABLET | Freq: Every day | ORAL | Status: DC

## 2014-08-19 NOTE — Telephone Encounter (Signed)
Pt called his pharmacy last week for a refill of digoxin, please advise

## 2014-09-10 ENCOUNTER — Ambulatory Visit (INDEPENDENT_AMBULATORY_CARE_PROVIDER_SITE_OTHER): Payer: Medicare Other | Admitting: Internal Medicine

## 2014-09-10 ENCOUNTER — Encounter: Payer: Self-pay | Admitting: Internal Medicine

## 2014-09-10 VITALS — BP 114/69 | HR 92 | Temp 98.4°F | Ht 70.0 in | Wt 384.3 lb

## 2014-09-10 DIAGNOSIS — E1169 Type 2 diabetes mellitus with other specified complication: Secondary | ICD-10-CM

## 2014-09-10 DIAGNOSIS — E119 Type 2 diabetes mellitus without complications: Secondary | ICD-10-CM

## 2014-09-10 DIAGNOSIS — M1A9XX0 Chronic gout, unspecified, without tophus (tophi): Secondary | ICD-10-CM | POA: Diagnosis not present

## 2014-09-10 DIAGNOSIS — I10 Essential (primary) hypertension: Secondary | ICD-10-CM

## 2014-09-10 DIAGNOSIS — D72829 Elevated white blood cell count, unspecified: Secondary | ICD-10-CM | POA: Diagnosis not present

## 2014-09-10 DIAGNOSIS — D473 Essential (hemorrhagic) thrombocythemia: Secondary | ICD-10-CM | POA: Diagnosis not present

## 2014-09-10 DIAGNOSIS — E790 Hyperuricemia without signs of inflammatory arthritis and tophaceous disease: Secondary | ICD-10-CM

## 2014-09-10 DIAGNOSIS — I5042 Chronic combined systolic (congestive) and diastolic (congestive) heart failure: Secondary | ICD-10-CM

## 2014-09-10 DIAGNOSIS — E669 Obesity, unspecified: Secondary | ICD-10-CM

## 2014-09-10 DIAGNOSIS — Z0000 Encounter for general adult medical examination without abnormal findings: Secondary | ICD-10-CM

## 2014-09-10 DIAGNOSIS — M25562 Pain in left knee: Secondary | ICD-10-CM

## 2014-09-10 DIAGNOSIS — D75839 Thrombocytosis, unspecified: Secondary | ICD-10-CM

## 2014-09-10 LAB — POCT GLYCOSYLATED HEMOGLOBIN (HGB A1C): HEMOGLOBIN A1C: 6.6

## 2014-09-10 LAB — GLUCOSE, CAPILLARY: Glucose-Capillary: 142 mg/dL — ABNORMAL HIGH (ref 70–99)

## 2014-09-10 MED ORDER — ALLOPURINOL 100 MG PO TABS: 200.0000 mg | ORAL_TABLET | Freq: Every day | ORAL | Status: DC

## 2014-09-10 NOTE — Progress Notes (Signed)
Subjective:   Patient ID: William Davila male   DOB: [birth year] 49 y.o.   MRN: [id number]  HPI: William Davila is a 49 y.o. male with recurrent gout, CHF, and other PMH as listed below presenting to opc today for follow up visit of gout attacks.   Uric acid remains elevated on last check. He currently has no gout flares and reports feeling well with no joint pain or swelling. He is taking allopurinol 100mg  daily.   He is now off all steroids and we will recheck HbA1C today. We discussed possible new diagnosis of diabetes and need for weight loss, diet modifications, and exercise. He is hesitant to start any new medications at this time. He says he was refused for the silver sneaker program. Unfortunately, he has gained some weight again from last visit.   Past Medical History  Diagnosis Date  . Chronic systolic heart failure     Diagnosed 2001; prior patient of Dr. Linard Millers with [name] - discharged from practice  . Dilated cardiomyopathy     LVEF 10-20%  . Hyperlipidemia   . Essential hypertension, benign   . Morbid obesity   . Gout   . 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 Arthritis     knee's  . VT (ventricular tachycardia) 06/07/2011   Current Outpatient Prescriptions  Medication Sig Dispense Refill  . acetaminophen (TYLENOL) 500 MG tablet Take 1,000 mg by mouth every 6 (six) hours as needed. For pain    . allopurinol (ZYLOPRIM) 100 MG tablet Take 1 tablet (100 mg total) by mouth daily. 30 tablet 1  . carvedilol (COREG) 25 MG tablet Take 1 tablet (25 mg total) by mouth 2 (two) times daily. 60 tablet 3  . digoxin (LANOXIN) 0.25 MG tablet Take 1 tablet (250 mcg total) by mouth daily. 30 tablet 3  . HYDROcodone-acetaminophen (NORCO/VICODIN) 5-325 MG per tablet Take 2 tablets by mouth every 4 (four) hours as needed for moderate pain or severe pain. 12 tablet 0  . lisinopril (PRINIVIL,ZESTRIL) 10 MG tablet Take 1 tablet (10 mg total) by mouth daily. 90  tablet 3  . naproxen (NAPROSYN) 500 MG tablet Take 1 tablet (500 mg total) by mouth 2 (two) times daily with a meal. 14 tablet 0  . spironolactone (ALDACTONE) 25 MG tablet Take 0.5 tablets (12.5 mg total) by mouth daily. 90 tablet 3  . torsemide (DEMADEX) 20 MG tablet Take 3 tablets (60 mg total) by mouth daily. 90 tablet 6   No current facility-administered medications for this visit.   Family History  Problem Relation Age of Onset  . Hypertension Mother    History   Social History  . Marital Status: Single    Spouse Name: N/A  . Number of Children: N/A  . Years of Education: N/A   Social History Main Topics  . Smoking status: Never Smoker   . Smokeless tobacco: Never Used  . Alcohol Use: No  . Drug Use: No  . Sexual Activity: Yes   Other Topics Concern  . None   Social History Narrative   Review of Systems:  Constitutional:  Denies fever, chills  HEENT:  Denies congestion  Respiratory:  Denies SOB, cough  Cardiovascular:  Denies chest pain  Gastrointestinal:  Denies nausea, vomiting, abdominal pain  Genitourinary:  Denies dysuria or hematuria. +Frequency  Musculoskeletal:  Reports arthritis in his knees when he bends down.   Skin:  Dry skin  Neurological:  Denies headaches.  Objective:  Physical Exam: Filed Vitals:   09/10/14 1426  BP: 114/69  Pulse: 92  Temp: 98.4 F (36.9 C)  TempSrc: Oral  Height: 5\' 10"  (1.778 m)  Weight: 384 lb 4.8 oz (174.317 kg)  SpO2: 98%   Vitals reviewed. General: sitting in chair, NAD HEENT: EOMI Cardiac: RRR Pulm: distant due to body habitus clear to auscultation bilaterally Abd: soft, obese, BS present Ext: moving all extremities, -tenderness to palpation of extremities and knees, no erythema or warmth but he reports some discomfort of mostly left knee when he bends down on his knees Neuro: alert and oriented X3  Assessment & Plan:  Discussed with Dr. Daryll Drown

## 2014-09-10 NOTE — Patient Instructions (Signed)
General Instructions:  Please bring your medicines with you each time you come to clinic.  Medicines may include prescription medications, over-the-counter medications, herbal remedies, eye drops, vitamins, or other pills.  Dear William Davila,  Please increase your allopurinol dose to 200mg  daily. If you have any signs of recurrent gout or joint pain, call us right away [phone]  Please work on your weightloss, exercise and diet. Weight yourself daily and let us or HF team know if your weights start trending up or down.   Progress Toward Treatment Goals:  Treatment Goal 09/10/2014  Blood pressure at goal    Self Care Goals & Plans:  Self Care Goal 09/10/2014  Manage my medications take my medicines as prescribed; bring my medications to every visit; refill my medications on time  Eat healthy foods eat more vegetables; eat foods that are low in salt; eat baked foods instead of fried foods  Be physically active take a walk every day    No flowsheet data found.   Care Management & Community Referrals:  No flowsheet data found.    Allopurinol tablets What is this medicine? ALLOPURINOL (al oh PURE i nole) reduces the amount of uric acid the body makes. It is used to treat the symptoms of gout. It is also used to treat or prevent high uric acid levels that occur as a result of certain types of chemotherapy. This medicine may also help patients who frequently have kidney stones. This medicine may be used for other purposes; ask your health care provider or pharmacist if you have questions. COMMON BRAND NAME(S): Zyloprim What should I tell my health care provider before I take this medicine? They need to know if you have any of these conditions: -kidney or liver disease -an unusual or allergic reaction to allopurinol, other medicines, foods, dyes, or preservatives -pregnant or trying to get pregnant -breast feeding How should I use this medicine? Take this medicine by mouth with a  glass of water. Follow the directions on the prescription label. If this medicine upsets your stomach, take it with food or milk. Take your doses at regular intervals. Do not take your medicine more often than directed. Talk to your pediatrician regarding the use of this medicine in children. Special care may be needed. While this drug may be prescribed for children as Schellhase as 6 years for selected conditions, precautions do apply. Overdosage: If you think you have taken too much of this medicine contact a poison control center or emergency room at once. NOTE: This medicine is only for you. Do not share this medicine with others. What if I miss a dose? If you miss a dose, take it as soon as you can. If it is almost time for your next dose, take only that dose. Do not take double or extra doses. What may interact with this medicine? Do not take this medicine with the following medication: -didanosine, ddI This medicine may also interact with the following medications: -amoxicillin or ampicillin -azathioprine -certain medicines used to treat gout -certain types of diuretics -chlorpropamide -cyclosporine -dicumarol -mercaptopurine -tolbutamide -warfarin This list may not describe all possible interactions. Give your health care provider a list of all the medicines, herbs, non-prescription drugs, or dietary supplements you use. Also tell them if you smoke, drink alcohol, or use illegal drugs. Some items may interact with your medicine. What should I watch for while using this medicine? Visit your doctor or health care professional for regular checks on your progress. If you are  taking this medicine to treat gout, you may not have less frequent attacks at first. Keep taking your medicine regularly and the attacks should get better within 2 to 6 weeks. Drink plenty of water (10 to 12 full glasses a day) while you are taking this medicine. This will help to reduce stomach upset and reduce the risk of  getting gout or kidney stones. Call your doctor or health care professional at once if you get a skin rash together with chills, fever, sore throat, or nausea and vomiting, if you have blood in your urine, or difficulty passing urine. Do not take vitamin C without asking your doctor or health care professional. Too much vitamin C can increase the chance of getting kidney stones. You may get drowsy or dizzy. Do not drive, use machinery, or do anything that needs mental alertness until you know how this drug affects you. Do not stand or sit up quickly, especially if you are an older patient. This reduces the risk of dizzy or fainting spells. Alcohol can make you more drowsy and dizzy. Alcohol can also increase the chance of stomach problems and increase the amount of uric acid in your blood. Avoid alcoholic drinks. What side effects may I notice from receiving this medicine? Side effects that you should report to your doctor or health care professional as soon as possible: -allergic reactions like skin rash, itching or hives, swelling of the face, lips, or tongue -breathing problems -muscle aches or pains -redness, blistering, peeling or loosening of the skin, including inside the mouth Side effects that usually do not require medical attention (report to your doctor or health care professional if they continue or are bothersome): -changes in taste -diarrhea -indigestion -stomach pain or cramps This list may not describe all possible side effects. Call your doctor for medical advice about side effects. You may report side effects to FDA at 1-800-FDA-1088. Where should I keep my medicine? Keep out of the reach of children. Store at room temperature between 15 and 25 degrees C (59 and 77 degrees F). Protect from light and moisture. Throw away any unused medicine after the expiration date. NOTE: This sheet is a summary. It may not cover all possible information. If you have questions about this  medicine, talk to your doctor, pharmacist, or health care provider.  2015, Elsevier/Gold Standard. (2007-12-04 14:26:54)

## 2014-09-11 DIAGNOSIS — D473 Essential (hemorrhagic) thrombocythemia: Secondary | ICD-10-CM | POA: Insufficient documentation

## 2014-09-11 DIAGNOSIS — D75839 Thrombocytosis, unspecified: Secondary | ICD-10-CM | POA: Insufficient documentation

## 2014-09-11 NOTE — Assessment & Plan Note (Signed)
Uric acid 11.1 07/2014. Currently on allopurinol 100mg  daily. No current gout flare, doing well since last flare.   -increase allopurinol to 200mg  daily -recheck uric acid on follow up visit after he has been on 200mg  daily -advised to call us right away if he notices any signs of recurrent gout with increased dose of allopurinol

## 2014-09-11 NOTE — Assessment & Plan Note (Signed)
Hx of L knee effusion and has also had steroid injection to L knee 08/2013 with sports medicine and notes significant improvement. Noted to also have OA on prior xray. He discussed returning to sports medicine for follow up as he is well overdue.    Follow up with Dr. Neal--patient will call for appointment

## 2014-09-11 NOTE — Assessment & Plan Note (Addendum)
Chronic dating back to 2009, making reactive etiology less likely. Trending down recently--plt down to 518 07/2014. Iron studies from 2016 showing elevated ferritin and low iron Iron/TIBC/Ferritin/ %Sat    Component Value Date/Time   IRON 40* 07/23/2014 1452   TIBC 323 07/23/2014 1452   FERRITIN 343* 07/23/2014 1452   IRONPCTSAT 12* 07/23/2014 1452    Will try to discuss with heme, may consider heme consult in future if deemed appropriate.

## 2014-09-11 NOTE — Assessment & Plan Note (Signed)
Weight trending back up today. He says he was refused for silver sneakers program when he called.   -will discuss with clinic CDE to see why he may have been refused and if there are other options for him -in the meantime, he is also advised to continue working on diet and exercising as tolerated--he says he has started walking more

## 2014-09-11 NOTE — Assessment & Plan Note (Signed)
Due for follow up with HF team in April. He reports improvement of his breathing from before. He says he had no difficulty walking all the way from the parking lot to clinic, uses the same 2 pillows to sleep at night, and does not feel SOB at rest.  Unfortunately his weight is trending up again. He reports being refused from silver sneakers program. We again discussed diet modifications and working on weight loss and exercise as tolerated. He is compliant with all of his medications.

## 2014-09-11 NOTE — Assessment & Plan Note (Signed)
Refused immunizations today  Will revisit again on follow up visit Due for foot exam, eye exam, and urine microalbumin check on follow up visit

## 2014-09-11 NOTE — Assessment & Plan Note (Signed)
BP Readings from Last 3 Encounters:  09/10/14 114/69  08/06/14 137/83  07/23/14 111/71   Lab Results  Component Value Date   NA 139 07/23/2014   K 4.2 07/23/2014   CREATININE 1.09 07/23/2014   Assessment: Blood pressure control: controlled Progress toward BP goal:  at goal  Plan: Medications:  continue current medications torsemide 60mg  daily, aldactone 12.5mg  daily, lisinopril 10mg  daily, digoxin 217mcg daily, and coreg 25mg  bid

## 2014-09-11 NOTE — Assessment & Plan Note (Signed)
Confirmed diagnosis today with HbA1C of 6.6. We discussed possible start of medications such as metformin, however he is very hesitant to start any more medications and is willing to read more on the medication on his own and let us know if he may be interested in initiating medication. Otherwise, we discussed lifestyle modifications again today including working on dietary changes and weight loss.   I will ask CDE to contact patient to see if we may be able to help with nutrition, more information on new diagnosis of diabetes, and also touch base about why he may not have qualified for silver sneakers program.  Will continue discussion on follow up visit

## 2014-09-11 NOTE — Assessment & Plan Note (Addendum)
Chronic dating back to 2009, now trending back down 13.7 last month. Unclear etiology. Current gout flare has resolved. No signs of active infection, no weight loss (in fact has weight gain). He is not a smoker and has been on steroids frequently recently due to gout flares that may contribute.   Discussed with Dr. Daryll Drown, will try to message Dr. Beryle Beams to see if any heme input, perhaps consider if he may need heme consult in the future for further work up if necessary?

## 2014-09-13 ENCOUNTER — Telehealth: Payer: Self-pay | Admitting: Dietician

## 2014-09-13 NOTE — Progress Notes (Signed)
Internal Medicine Clinic Attending  Case discussed with Dr. Eula Fried at the time of the visit.  We reviewed the resident's history and exam and pertinent patient test results.  I agree with the assessment, diagnosis, and plan of care documented in the resident's note.  Agree with discussing heme changes with Dr. Beryle Beams.  Changes are chronic and not associated with any obvious symptoms of infection.  Will see if Dr. Darnell Level has any further advice on this subject.

## 2014-09-24 NOTE — Telephone Encounter (Signed)
Called patient to offer an appointment per Dr. Demetrio Lapping referral for Medical Nutrition Therapy for heart failure. . Left message for return call.

## 2014-10-04 ENCOUNTER — Other Ambulatory Visit (HOSPITAL_COMMUNITY): Payer: Self-pay | Admitting: Cardiology

## 2014-10-04 DIAGNOSIS — I5022 Chronic systolic (congestive) heart failure: Secondary | ICD-10-CM

## 2014-10-04 MED ORDER — CARVEDILOL 25 MG PO TABS: 25.0000 mg | ORAL_TABLET | Freq: Two times a day (BID) | ORAL | Status: DC

## 2014-10-24 ENCOUNTER — Encounter: Payer: Self-pay | Admitting: *Deleted

## 2014-11-26 ENCOUNTER — Ambulatory Visit (INDEPENDENT_AMBULATORY_CARE_PROVIDER_SITE_OTHER): Payer: Medicare Other | Admitting: Internal Medicine

## 2014-11-26 ENCOUNTER — Encounter: Payer: Self-pay | Admitting: Internal Medicine

## 2014-11-26 VITALS — BP 104/76 | HR 96 | Ht 70.0 in | Wt 385.4 lb

## 2014-11-26 DIAGNOSIS — I5022 Chronic systolic (congestive) heart failure: Secondary | ICD-10-CM | POA: Diagnosis not present

## 2014-11-26 DIAGNOSIS — I472 Ventricular tachycardia, unspecified: Secondary | ICD-10-CM

## 2014-11-26 LAB — CUP PACEART INCLINIC DEVICE CHECK
Brady Statistic AP VP Percent: 0 %
Brady Statistic AP VS Percent: 0 %
Brady Statistic AS VP Percent: 0.01 %
Brady Statistic AS VS Percent: 99.99 %
Brady Statistic RA Percent Paced: 0 %
Brady Statistic RV Percent Paced: 0.01 %
Date Time Interrogation Session: 20160614175658
HIGH POWER IMPEDANCE MEASURED VALUE: 304 Ohm
HighPow Impedance: 266 Ohm
HighPow Impedance: 49 Ohm
HighPow Impedance: 70 Ohm
Lead Channel Impedance Value: 4047 Ohm
Lead Channel Impedance Value: 4047 Ohm
Lead Channel Pacing Threshold Amplitude: 1 V
Lead Channel Pacing Threshold Pulse Width: 0.4 ms
Lead Channel Sensing Intrinsic Amplitude: 10.125 mV
MDC IDC MSMT BATTERY VOLTAGE: 3.09 V
MDC IDC MSMT LEADCHNL LV IMPEDANCE VALUE: 4047 Ohm
MDC IDC MSMT LEADCHNL LV IMPEDANCE VALUE: 4047 Ohm
MDC IDC MSMT LEADCHNL RV IMPEDANCE VALUE: 437 Ohm
MDC IDC SET LEADCHNL RV PACING AMPLITUDE: 2.5 V
MDC IDC SET LEADCHNL RV PACING PULSEWIDTH: 0.4 ms
MDC IDC SET LEADCHNL RV SENSING SENSITIVITY: 0.3 mV
MDC IDC SET ZONE DETECTION INTERVAL: 350 ms
Zone Setting Detection Interval: 250 ms
Zone Setting Detection Interval: 300 ms
Zone Setting Detection Interval: 350 ms

## 2014-11-26 MED ORDER — LISINOPRIL 20 MG PO TABS: 10.0000 mg | ORAL_TABLET | Freq: Every day | ORAL | Status: DC

## 2014-11-26 MED ORDER — ALLOPURINOL 100 MG PO TABS: 200.0000 mg | ORAL_TABLET | Freq: Every day | ORAL | Status: DC

## 2014-11-26 NOTE — Progress Notes (Signed)
Patient Care Team: No Pcp Per Patient as PCP - General (General Practice)   HPI  William Davila is a 49 y.o. 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 He has a history of nonsustained ventricular tachycardia.  Echo 3/13--EF 20-25% with mild MR. ECHO 06/20/14 EF 25-30% RV normal   Seen  last 12/14  Seen in heart. Clinic   these notes were reviewed 1/16 Aldactone started and lisinopril increased; follow-up blood work one month later demonstrated normal potassium    He has problems with exercise related to his knees and weight. He has daytime somnolence but does not think it is excessive. He's had no edema. He has chronic dyspnea on exertion He is bored and is unable to get around because of transportation issues  Past Medical History  Diagnosis Date  . Chronic systolic heart failure     Diagnosed 2001; prior patient of Dr. Linard Millers with [name] - discharged from practice  . Dilated cardiomyopathy     LVEF 10-20%  . Hyperlipidemia   . Essential hypertension, benign   . Morbid obesity   . Gout   . 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 Arthritis     knee's  . VT (ventricular tachycardia) 06/07/2011    Past Surgical History  Procedure Laterality Date  . Icd  10/27/2011  . Implantable cardioverter defibrillator implant N/A 10/27/2011    Procedure: IMPLANTABLE CARDIOVERTER DEFIBRILLATOR IMPLANT;  Surgeon: Deboraha Sprang, MD;  Location: [facility] CATH LAB;  Service: Cardiovascular;  Laterality: N/A;    Current Outpatient Prescriptions  Medication Sig Dispense Refill  . acetaminophen (TYLENOL) 500 MG tablet Take 1,000 mg by mouth every 6 (six) hours as needed. For pain    . allopurinol (ZYLOPRIM) 100 MG tablet Take 2 tablets (200 mg total) by mouth daily. 60 tablet 1  . carvedilol (COREG) 25 MG tablet Take 1 tablet (25 mg total) by mouth 2 (two) times daily. 60 tablet 3    . digoxin (LANOXIN) 0.25 MG tablet Take 1 tablet (250 mcg total) by mouth daily. 30 tablet 3  . lisinopril (PRINIVIL,ZESTRIL) 10 MG tablet Take 1 tablet (10 mg total) by mouth daily. 90 tablet 3  . Naproxen Sodium (ALEVE PO) Take by mouth as needed.    Marland Kitchen spironolactone (ALDACTONE) 25 MG tablet Take 0.5 tablets (12.5 mg total) by mouth daily. 90 tablet 3  . torsemide (DEMADEX) 20 MG tablet Take 3 tablets (60 mg total) by mouth daily. 90 tablet 6   No current facility-administered medications for this visit.    Allergies  Allergen Reactions  . Beta Adrenergic Blockers Other (See Comments)    REACTION: fatigue and increased dyspnea with beta blockers.  . Isosorb Dinitrate-Hydralazine Other (See Comments)    REACTION: headache    Review of Systems negative except from HPI and PMH  Physical Exam BP 104/76 mmHg  Pulse 96  Ht 5\' 10"  (1.778 m)  Wt 385 lb 6.4 oz (174.816 kg)  BMI 55.30 kg/m2 Well developed and well nourished in no acute distress HENT normal E scleral and icterus clear Neck Supple JVP flat; carotids brisk and full Clear to ausculation  Regular rate and rhythm, no murmurs gallops or rub Soft with active bowel sounds No clubbing cyanosis no  Edema Alert and oriented, grossly normal motor and sensory function Skin Warm and Dry  ECG demonstrated sinus rate at  96 Intervals 25/14/36 Axis I -44  Assessment and  Plan  NICM\  ICD The patient's device was interrogated.  The information was reviewed. No changes were made in the programming.     CHF   Morbidly obese  Stressed the importance of losing weight and exercise  HTN  Have spoken with Dr Reine Just  Re ivabradine or ACE  He suggests the latter and will switch him to entresto in CHF clinic He has an appointment

## 2014-11-26 NOTE — Patient Instructions (Addendum)
Medication Instructions:  Your physician has recommended you make the following change in your medication:  1) INCREASE Lisinopril to 20 mg by mouth daily   Labwork: Your physician recommends that you return for lab work TODAY (BMET)   Testing/Procedures: NONE  Follow-Up: Your physician recommends that you schedule a follow-up appointment in: 1 MONTH with Dr. Haroldine Laws in the [facility]. Phone: [phone]   Remote monitoring is used to monitor your Pacemaker or ICD from home. This monitoring reduces the number of office visits required to check your device to one time per year. It allows [place] to keep an eye on the functioning of your device to ensure it is working properly. You are scheduled for a device check from home on 02/25/2015. You may send your transmission at any time that day. If you have a wireless device, the transmission will be sent automatically. After your physician reviews your transmission, you will receive a postcard with your next transmission date.  Your physician wants you to follow-up in: 12 months with Dr. Caryl Comes. You will receive a reminder letter in the mail two months in advance. If you don't receive a letter, please call our office to schedule the follow-up appointment.   Any Other Special Instructions Will Be Listed Below (If Applicable).

## 2014-11-27 LAB — BASIC METABOLIC PANEL
BUN: 13 mg/dL (ref 6–23)
CALCIUM: 9.4 mg/dL (ref 8.4–10.5)
CO2: 33 meq/L — AB (ref 19–32)
CREATININE: 1.12 mg/dL (ref 0.40–1.50)
Chloride: 100 mEq/L (ref 96–112)
GFR: 90.25 mL/min (ref >60.00)
GLUCOSE: 85 mg/dL (ref 70–99)
Potassium: 4.2 mEq/L (ref 3.5–5.1)
SODIUM: 137 meq/L (ref 135–145)

## 2014-12-03 ENCOUNTER — Encounter (HOSPITAL_COMMUNITY): Payer: Self-pay | Admitting: Emergency Medicine

## 2014-12-03 ENCOUNTER — Emergency Department (INDEPENDENT_AMBULATORY_CARE_PROVIDER_SITE_OTHER)
Admission: EM | Admit: 2014-12-03 | Discharge: 2014-12-03 | Disposition: A | Discharge status: Home / Self Care | Payer: Medicare Other | Source: Home / Self Care | Attending: Family Medicine | Admitting: Family Medicine

## 2014-12-03 DIAGNOSIS — I5042 Chronic combined systolic (congestive) and diastolic (congestive) heart failure: Secondary | ICD-10-CM

## 2014-12-03 DIAGNOSIS — M5432 Sciatica, left side: Secondary | ICD-10-CM

## 2014-12-03 LAB — POCT URINALYSIS DIP (DEVICE)
Bilirubin Urine: NEGATIVE
Glucose, UA: NEGATIVE mg/dL
Ketones, ur: NEGATIVE mg/dL
Leukocytes, UA: NEGATIVE
Nitrite: NEGATIVE
Protein, ur: NEGATIVE mg/dL
Specific Gravity, Urine: 1.015 (ref 1.005–1.030)
Urobilinogen, UA: 0.2 mg/dL (ref 0.0–1.0)
pH: 7 (ref 5.0–8.0)

## 2014-12-03 MED ORDER — METHOCARBAMOL 500 MG PO TABS: 500.0000 mg | ORAL_TABLET | Freq: Four times a day (QID) | ORAL | Status: DC | PRN

## 2014-12-03 MED ORDER — KETOROLAC TROMETHAMINE 60 MG/2ML IM SOLN
INTRAMUSCULAR | Status: AC
  Filled 2014-12-03: qty 2

## 2014-12-03 MED ORDER — KETOROLAC TROMETHAMINE 60 MG/2ML IM SOLN
60.0000 mg | Freq: Once | INTRAMUSCULAR | Status: AC
  Administered 2014-12-03: 60 mg via INTRAMUSCULAR

## 2014-12-03 MED ORDER — DICLOFENAC SODIUM 75 MG PO TBEC: 75.0000 mg | DELAYED_RELEASE_TABLET | Freq: Two times a day (BID) | ORAL | Status: DC

## 2014-12-03 NOTE — ED Notes (Signed)
C/o back pain onset Sunday... Increases w/activity... Pain is 8/10 now Denies inj/trauma, urinary sx Steady gait assisted by x1 crutch Alert, no signs of acute distress.

## 2014-12-03 NOTE — Discharge Instructions (Signed)
You are likely suffering from sciatica. You were given a shot of medicine to help with this pain Please start the voltaren in 24 hours Please take tylenol 1000mg  every 8 hours  Please use the Robaxin for additional relief Please apply heat and massage to your back and try to perform the exercises outlined below.  Please go to the emergenty room if you get worse.   Sciatica with Rehab The sciatic nerve runs from the back down the leg and is responsible for sensation and control of the muscles in the back (posterior) side of the thigh, lower leg, and foot. Sciatica is a condition that is characterized by inflammation of this nerve.  SYMPTOMS   Signs of nerve damage, including numbness and/or weakness along the posterior side of the lower extremity.  Pain in the back of the thigh that may also travel down the leg.  Pain that worsens when sitting for long periods of time.  Occasionally, pain in the back or buttock. CAUSES  Inflammation of the sciatic nerve is the cause of sciatica. The inflammation is due to something irritating the nerve. Common sources of irritation include:  Sitting for long periods of time.  Direct trauma to the nerve.  Arthritis of the spine.  Herniated or ruptured disk.  Slipping of the vertebrae (spondylolisthesis).  Pressure from soft tissues, such as muscles or ligament-like tissue (fascia). RISK INCREASES WITH:  Sports that place pressure or stress on the spine (football or weightlifting).  Poor strength and flexibility.  Failure to warm up properly before activity.  Family history of low back pain or disk disorders.  Previous back injury or surgery.  Poor body mechanics, especially when lifting, or poor posture. PREVENTION   Warm up and stretch properly before activity.  Maintain physical fitness:  Strength, flexibility, and endurance.  Cardiovascular fitness.  Learn and use proper technique, especially with posture and lifting. When  possible, have coach correct improper technique.  Avoid activities that place stress on the spine. PROGNOSIS If treated properly, then sciatica usually resolves within 6 weeks. However, occasionally surgery is necessary.  RELATED COMPLICATIONS   Permanent nerve damage, including pain, numbness, tingle, or weakness.  Chronic back pain.  Risks of surgery: infection, bleeding, nerve damage, or damage to surrounding tissues. TREATMENT Treatment initially involves resting from any activities that aggravate your symptoms. The use of ice and medication may help reduce pain and inflammation. The use of strengthening and stretching exercises may help reduce pain with activity. These exercises may be performed at home or with referral to a therapist. A therapist may recommend further treatments, such as transcutaneous electronic nerve stimulation (TENS) or ultrasound. Your caregiver may recommend corticosteroid injections to help reduce inflammation of the sciatic nerve. If symptoms persist despite non-surgical (conservative) treatment, then surgery may be recommended. MEDICATION  If pain medication is necessary, then nonsteroidal anti-inflammatory medications, such as aspirin and ibuprofen, or other minor pain relievers, such as acetaminophen, are often recommended.  Do not take pain medication for 7 days before surgery.  Prescription pain relievers may be given if deemed necessary by your caregiver. Use only as directed and only as much as you need.  Ointments applied to the skin may be helpful.  Corticosteroid injections may be given by your caregiver. These injections should be reserved for the most serious cases, because they may only be given a certain number of times. HEAT AND COLD  Cold treatment (icing) relieves pain and reduces inflammation. Cold treatment should be applied for 10  to 15 minutes every 2 to 3 hours for inflammation and pain and immediately after any activity that aggravates  your symptoms. Use ice packs or massage the area with a piece of ice (ice massage).  Heat treatment may be used prior to performing the stretching and strengthening activities prescribed by your caregiver, physical therapist, or athletic trainer. Use a heat pack or soak the injury in warm water. SEEK MEDICAL CARE IF:  Treatment seems to offer no benefit, or the condition worsens.  Any medications produce adverse side effects. EXERCISES  RANGE OF MOTION (ROM) AND STRETCHING EXERCISES - Sciatica Most people with sciatic will find that their symptoms worsen with either excessive bending forward (flexion) or arching at the low back (extension). The exercises which will help resolve your symptoms will focus on the opposite motion. Your physician, physical therapist or athletic trainer will help you determine which exercises will be most helpful to resolve your low back pain. Do not complete any exercises without first consulting with your clinician. Discontinue any exercises which worsen your symptoms until you speak to your clinician. If you have pain, numbness or tingling which travels down into your buttocks, leg or foot, the goal of the therapy is for these symptoms to move closer to your back and eventually resolve. Occasionally, these leg symptoms will get better, but your low back pain may worsen; this is typically an indication of progress in your rehabilitation. Be certain to be very alert to any changes in your symptoms and the activities in which you participated in the 24 hours prior to the change. Sharing this information with your clinician will allow him/her to most efficiently treat your condition. These exercises may help you when beginning to rehabilitate your injury. Your symptoms may resolve with or without further involvement from your physician, physical therapist or athletic trainer. While completing these exercises, remember:   Restoring tissue flexibility helps normal motion to  return to the joints. This allows healthier, less painful movement and activity.  An effective stretch should be held for at least 30 seconds.  A stretch should never be painful. You should only feel a gentle lengthening or release in the stretched tissue. FLEXION RANGE OF MOTION AND STRETCHING EXERCISES: STRETCH - Flexion, Single Knee to Chest   Lie on a firm bed or floor with both legs extended in front of you.  Keeping one leg in contact with the floor, bring your opposite knee to your chest. Hold your leg in place by either grabbing behind your thigh or at your knee.  Pull until you feel a gentle stretch in your low back. Hold __________ seconds.  Slowly release your grasp and repeat the exercise with the opposite side. Repeat __________ times. Complete this exercise __________ times per day.  STRETCH - Flexion, Double Knee to Chest  Lie on a firm bed or floor with both legs extended in front of you.  Keeping one leg in contact with the floor, bring your opposite knee to your chest.  Tense your stomach muscles to support your back and then lift your other knee to your chest. Hold your legs in place by either grabbing behind your thighs or at your knees.  Pull both knees toward your chest until you feel a gentle stretch in your low back. Hold __________ seconds.  Tense your stomach muscles and slowly return one leg at a time to the floor. Repeat __________ times. Complete this exercise __________ times per day.  STRETCH - Low Trunk Rotation  Lie on a firm bed or floor. Keeping your legs in front of you, bend your knees so they are both pointed toward the ceiling and your feet are flat on the floor.  Extend your arms out to the side. This will stabilize your upper body by keeping your shoulders in contact with the floor.  Gently and slowly drop both knees together to one side until you feel a gentle stretch in your low back. Hold for __________ seconds.  Tense your stomach  muscles to support your low back as you bring your knees back to the starting position. Repeat the exercise to the other side. Repeat __________ times. Complete this exercise __________ times per day  EXTENSION RANGE OF MOTION AND FLEXIBILITY EXERCISES: STRETCH - Extension, Prone on Elbows  Lie on your stomach on the floor, a bed will be too soft. Place your palms about shoulder width apart and at the height of your head.  Place your elbows under your shoulders. If this is too painful, stack pillows under your chest.  Allow your body to relax so that your hips drop lower and make contact more completely with the floor.  Hold this position for __________ seconds.  Slowly return to lying flat on the floor. Repeat __________ times. Complete this exercise __________ times per day.  RANGE OF MOTION - Extension, Prone Press Ups  Lie on your stomach on the floor, a bed will be too soft. Place your palms about shoulder width apart and at the height of your head.  Keeping your back as relaxed as possible, slowly straighten your elbows while keeping your hips on the floor. You may adjust the placement of your hands to maximize your comfort. As you gain motion, your hands will come more underneath your shoulders.  Hold this position __________ seconds.  Slowly return to lying flat on the floor. Repeat __________ times. Complete this exercise __________ times per day.  STRENGTHENING EXERCISES - Sciatica  These exercises may help you when beginning to rehabilitate your injury. These exercises should be done near your "sweet spot." This is the neutral, low-back arch, somewhere between fully rounded and fully arched, that is your least painful position. When performed in this safe range of motion, these exercises can be used for people who have either a flexion or extension based injury. These exercises may resolve your symptoms with or without further involvement from your physician, physical therapist  or athletic trainer. While completing these exercises, remember:   Muscles can gain both the endurance and the strength needed for everyday activities through controlled exercises.  Complete these exercises as instructed by your physician, physical therapist or athletic trainer. Progress with the resistance and repetition exercises only as your caregiver advises.  You may experience muscle soreness or fatigue, but the pain or discomfort you are trying to eliminate should never worsen during these exercises. If this pain does worsen, stop and make certain you are following the directions exactly. If the pain is still present after adjustments, discontinue the exercise until you can discuss the trouble with your clinician. STRENGTHENING - Deep Abdominals, Pelvic Tilt   Lie on a firm bed or floor. Keeping your legs in front of you, bend your knees so they are both pointed toward the ceiling and your feet are flat on the floor.  Tense your lower abdominal muscles to press your low back into the floor. This motion will rotate your pelvis so that your tail bone is scooping upwards rather than pointing at your  feet or into the floor.  With a gentle tension and even breathing, hold this position for __________ seconds. Repeat __________ times. Complete this exercise __________ times per day.  STRENGTHENING - Abdominals, Crunches   Lie on a firm bed or floor. Keeping your legs in front of you, bend your knees so they are both pointed toward the ceiling and your feet are flat on the floor. Cross your arms over your chest.  Slightly tip your chin down without bending your neck.  Tense your abdominals and slowly lift your trunk high enough to just clear your shoulder blades. Lifting higher can put excessive stress on the low back and does not further strengthen your abdominal muscles.  Control your return to the starting position. Repeat __________ times. Complete this exercise __________ times per day.    STRENGTHENING - Quadruped, Opposite UE/LE Lift  Assume a hands and knees position on a firm surface. Keep your hands under your shoulders and your knees under your hips. You may place padding under your knees for comfort.  Find your neutral spine and gently tense your abdominal muscles so that you can maintain this position. Your shoulders and hips should form a rectangle that is parallel with the floor and is not twisted.  Keeping your trunk steady, lift your right hand no higher than your shoulder and then your left leg no higher than your hip. Make sure you are not holding your breath. Hold this position __________ seconds.  Continuing to keep your abdominal muscles tense and your back steady, slowly return to your starting position. Repeat with the opposite arm and leg. Repeat __________ times. Complete this exercise __________ times per day.  STRENGTHENING - Abdominals and Quadriceps, Straight Leg Raise   Lie on a firm bed or floor with both legs extended in front of you.  Keeping one leg in contact with the floor, bend the other knee so that your foot can rest flat on the floor.  Find your neutral spine, and tense your abdominal muscles to maintain your spinal position throughout the exercise.  Slowly lift your straight leg off the floor about 6 inches for a count of 15, making sure to not hold your breath.  Still keeping your neutral spine, slowly lower your leg all the way to the floor. Repeat this exercise with each leg __________ times. Complete this exercise __________ times per day. POSTURE AND BODY MECHANICS CONSIDERATIONS - Sciatica Keeping correct posture when sitting, standing or completing your activities will reduce the stress put on different body tissues, allowing injured tissues a chance to heal and limiting painful experiences. The following are general guidelines for improved posture. Your physician or physical therapist will provide you with any instructions specific  to your needs. While reading these guidelines, remember:  The exercises prescribed by your provider will help you have the flexibility and strength to maintain correct postures.  The correct posture provides the optimal environment for your joints to work. All of your joints have less wear and tear when properly supported by a spine with good posture. This means you will experience a healthier, less painful body.  Correct posture must be practiced with all of your activities, especially prolonged sitting and standing. Correct posture is as important when doing repetitive low-stress activities (typing) as it is when doing a single heavy-load activity (lifting). RESTING POSITIONS Consider which positions are most painful for you when choosing a resting position. If you have pain with flexion-based activities (sitting, bending, stooping, squatting), choose a  position that allows you to rest in a less flexed posture. You would want to avoid curling into a fetal position on your side. If your pain worsens with extension-based activities (prolonged standing, working overhead), avoid resting in an extended position such as sleeping on your stomach. Most people will find more comfort when they rest with their spine in a more neutral position, neither too rounded nor too arched. Lying on a non-sagging bed on your side with a pillow between your knees, or on your back with a pillow under your knees will often provide some relief. Keep in mind, being in any one position for a prolonged period of time, no matter how correct your posture, can still lead to stiffness. PROPER SITTING POSTURE In order to minimize stress and discomfort on your spine, you must sit with correct posture Sitting with good posture should be effortless for a healthy body. Returning to good posture is a gradual process. Many people can work toward this most comfortably by using various supports until they have the flexibility and strength to  maintain this posture on their own. When sitting with proper posture, your ears will fall over your shoulders and your shoulders will fall over your hips. You should use the back of the chair to support your upper back. Your low back will be in a neutral position, just slightly arched. You may place a small pillow or folded towel at the base of your low back for support.  When working at a desk, create an environment that supports good, upright posture. Without extra support, muscles fatigue and lead to excessive strain on joints and other tissues. Keep these recommendations in mind: CHAIR:   A chair should be able to slide under your desk when your back makes contact with the back of the chair. This allows you to work closely.  The chair's height should allow your eyes to be level with the upper part of your monitor and your hands to be slightly lower than your elbows. BODY POSITION  Your feet should make contact with the floor. If this is not possible, use a foot rest.  Keep your ears over your shoulders. This will reduce stress on your neck and low back. INCORRECT SITTING POSTURES   If you are feeling tired and unable to assume a healthy sitting posture, do not slouch or slump. This puts excessive strain on your back tissues, causing more damage and pain. Healthier options include:  Using more support, like a lumbar pillow.  Switching tasks to something that requires you to be upright or walking.  Talking a brief walk.  Lying down to rest in a neutral-spine position. PROLONGED STANDING WHILE SLIGHTLY LEANING FORWARD  When completing a task that requires you to lean forward while standing in one place for a long time, place either foot up on a stationary 2-4 inch high object to help maintain the best posture. When both feet are on the ground, the low back tends to lose its slight inward curve. If this curve flattens (or becomes too large), then the back and your other joints will  experience too much stress, fatigue more quickly and can cause pain.  CORRECT STANDING POSTURES Proper standing posture should be assumed with all daily activities, even if they only take a few moments, like when brushing your teeth. As in sitting, your ears should fall over your shoulders and your shoulders should fall over your hips. You should keep a slight tension in your abdominal muscles to brace  your spine. Your tailbone should point down to the ground, not behind your body, resulting in an over-extended swayback posture.  INCORRECT STANDING POSTURES  Common incorrect standing postures include a forward head, locked knees and/or an excessive swayback. WALKING Walk with an upright posture. Your ears, shoulders and hips should all line-up. PROLONGED ACTIVITY IN A FLEXED POSITION When completing a task that requires you to bend forward at your waist or lean over a low surface, try to find a way to stabilize 3 of 4 of your limbs. You can place a hand or elbow on your thigh or rest a knee on the surface you are reaching across. This will provide you more stability so that your muscles do not fatigue as quickly. By keeping your knees relaxed, or slightly bent, you will also reduce stress across your low back. CORRECT LIFTING TECHNIQUES DO :   Assume a wide stance. This will provide you more stability and the opportunity to get as close as possible to the object which you are lifting.  Tense your abdominals to brace your spine; then bend at the knees and hips. Keeping your back locked in a neutral-spine position, lift using your leg muscles. Lift with your legs, keeping your back straight.  Test the weight of unknown objects before attempting to lift them.  Try to keep your elbows locked down at your sides in order get the best strength from your shoulders when carrying an object.  Always ask for help when lifting heavy or awkward objects. INCORRECT LIFTING TECHNIQUES DO NOT:   Lock your  knees when lifting, even if it is a small object.  Bend and twist. Pivot at your feet or move your feet when needing to change directions.  Assume that you cannot safely pick up a paperclip without proper posture. Document Released: 05/31/2005 Document Revised: 10/15/2013 Document Reviewed: 09/12/2008 [facility] Patient Information 2015 [place], [place]. This information is not intended to replace advice given to you by your health care provider. Make sure you discuss any questions you have with your health care provider.

## 2014-12-03 NOTE — ED Provider Notes (Signed)
CSN: [id number]     Arrival date & time 12/03/14  1301 History   First MD Initiated Contact with Patient 12/03/14 1332     Chief Complaint  Patient presents with  . Back Pain   (Consider location/radiation/quality/duration/timing/severity/associated sxs/prior Treatment) HPI    2 days of left lower back pain. Radiation down the posterior lateral aspect of the left leg. Tylenol without benefit. Patient is used a crutch intermittently with some improvement. Overall the pain is fairly constant with flares with certain movements. But getting worse. Denies any loss of bowel or bladder function or cell anesthesia. Denies any loss of strength in the leg. Denies any acute trauma to the lower back or change in recent physical activity. Of note patient with significant heart failure but without any swelling in his lower extremities shortness of breath chest pain or fevers. Patient is compliant with all his other medical regimen.  Past Medical History  Diagnosis Date  . Chronic systolic heart failure     Diagnosed 2001; prior patient of Dr. Linard Millers [name] - discharged from practice  . Dilated cardiomyopathy     LVEF 10-20%  . Hyperlipidemia   . Essential hypertension, benign   . Morbid obesity   . Gout   . 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 Arthritis     knee's  . VT (ventricular tachycardia) 06/07/2011   Past Surgical History  Procedure Laterality Date  . Icd  10/27/2011  . Implantable cardioverter defibrillator implant N/A 10/27/2011    Procedure: IMPLANTABLE CARDIOVERTER DEFIBRILLATOR IMPLANT;  Surgeon: Deboraha Sprang, MD;  Location: [facility] CATH LAB;  Service: Cardiovascular;  Laterality: N/A;   Family History  Problem Relation Age of Onset  . Hypertension Mother    History  Substance Use Topics  . Smoking status: Never Smoker   . Smokeless tobacco: Never Used  . Alcohol Use: 0.0 oz/week    0 Standard drinks or equivalent per week     Comment: 1-2 beers on  the weekend    Review of Systems Per HPI with all other pertinent systems negative.   Allergies  Beta adrenergic blockers and Isosorb dinitrate-hydralazine  Home Medications   Prior to Admission medications   Medication Sig Start Date End Date Taking? Authorizing Provider  acetaminophen (TYLENOL) 500 MG tablet Take 1,000 mg by mouth every 6 (six) hours as needed. For pain   Yes Historical Provider, MD  allopurinol (ZYLOPRIM) 100 MG tablet Take 2 tablets (200 mg total) by mouth daily. 11/26/14  Yes Deboraha Sprang, MD  carvedilol (COREG) 25 MG tablet Take 1 tablet (25 mg total) by mouth 2 (two) times daily. 10/04/14  Yes Jolaine Artist, MD  digoxin (LANOXIN) 0.25 MG tablet Take 1 tablet (250 mcg total) by mouth daily. 08/19/14  Yes Shaune Pascal Bensimhon, MD  lisinopril (PRINIVIL,ZESTRIL) 20 MG tablet Take 0.5 tablets (10 mg total) by mouth daily. 11/26/14  Yes Deboraha Sprang, MD  Naproxen Sodium (ALEVE PO) Take by mouth as needed.   Yes Historical Provider, MD  spironolactone (ALDACTONE) 25 MG tablet Take 0.5 tablets (12.5 mg total) by mouth daily. 06/20/14  Yes Amy D Clegg, NP  diclofenac (VOLTAREN) 75 MG EC tablet Take 1 tablet (75 mg total) by mouth 2 (two) times daily. 12/03/14   Waldemar Dickens, MD  methocarbamol (ROBAXIN) 500 MG tablet Take 1-2 tablets (500-1,000 mg total) by mouth every 6 (six) hours as needed for muscle spasms. 12/03/14   Waldemar Dickens,  MD  torsemide (DEMADEX) 20 MG tablet Take 3 tablets (60 mg total) by mouth daily. 06/25/14   Shaune Pascal Bensimhon, MD   BP 109/57 mmHg  Pulse 103  Temp(Src) 98.5 F (36.9 C) (Oral)  Resp 16  SpO2 97% Physical Exam Physical Exam  Constitutional: oriented to person, place, and time. appears well-developed and well-nourished. No distress.  HENT:  Head: Normocephalic and atraumatic.  Eyes: EOMI. PERRL.  Neck: Normal range of motion.  Cardiovascular: RRR, no m/r/g, 2+ distal pulses,  Pulmonary/Chest: Effort normal and breath sounds  normal. No respiratory distress.  Abdominal: Soft. Bowel sounds are normal. NonTTP, no distension.  Musculoskeletal: Patient very obese and difficult to fully evaluate his lumbar spine due to decreased range of motion from body habitus. The bony abnormality noted. Lower extremity strength symmetric.  Neurological: alert and oriented to person, place, and time.  Skin: Skin is warm. No rash noted. non diaphoretic.  Psychiatric: normal mood and affect. behavior is normal. Judgment and thought content normal.   ED Course  Procedures (including critical care time) Labs Review Labs Reviewed  POCT URINALYSIS DIP (DEVICE) - Abnormal; Notable for the following:    Hgb urine dipstick TRACE (*)    All other components within normal limits    Imaging Review No results found.   MDM   1. Left sided sciatica   2. Chronic combined systolic and diastolic congestive heart failure    Toradol 60 mg IM given in clinic. Last creatinine normal. Patient to start Voltaren in 24 hours. Patient also start Robaxin for additional muscle spasm and strain relief. Patient given detailed handout on sciatica and will start performing lumbar exercises for this.    Waldemar Dickens, MD 12/03/14 [phone]

## 2014-12-17 ENCOUNTER — Other Ambulatory Visit (HOSPITAL_COMMUNITY): Payer: Self-pay | Admitting: *Deleted

## 2014-12-17 DIAGNOSIS — I5022 Chronic systolic (congestive) heart failure: Secondary | ICD-10-CM

## 2014-12-17 MED ORDER — DIGOXIN 250 MCG PO TABS: 250.0000 ug | ORAL_TABLET | Freq: Every day | ORAL | Status: DC

## 2015-01-21 ENCOUNTER — Other Ambulatory Visit (HOSPITAL_COMMUNITY): Payer: Self-pay | Admitting: *Deleted

## 2015-01-21 MED ORDER — TORSEMIDE 20 MG PO TABS: 60.0000 mg | ORAL_TABLET | Freq: Every day | ORAL | Status: DC

## 2015-02-14 ENCOUNTER — Other Ambulatory Visit (HOSPITAL_COMMUNITY): Payer: Self-pay | Admitting: Cardiology

## 2015-02-14 DIAGNOSIS — I5022 Chronic systolic (congestive) heart failure: Secondary | ICD-10-CM

## 2015-02-14 MED ORDER — CARVEDILOL 25 MG PO TABS: 25.0000 mg | ORAL_TABLET | Freq: Two times a day (BID) | ORAL | Status: DC

## 2015-02-18 ENCOUNTER — Other Ambulatory Visit (HOSPITAL_COMMUNITY): Payer: Self-pay | Admitting: *Deleted

## 2015-02-21 ENCOUNTER — Emergency Department (HOSPITAL_COMMUNITY)
Admission: EM | Admit: 2015-02-21 | Discharge: 2015-02-21 | Disposition: A | Discharge status: Home / Self Care | Payer: Medicare Other | Attending: Emergency Medicine | Admitting: Emergency Medicine

## 2015-02-21 ENCOUNTER — Encounter (HOSPITAL_COMMUNITY): Payer: Self-pay

## 2015-02-21 DIAGNOSIS — Z79899 Other long term (current) drug therapy: Secondary | ICD-10-CM | POA: Insufficient documentation

## 2015-02-21 DIAGNOSIS — Z791 Long term (current) use of non-steroidal anti-inflammatories (NSAID): Secondary | ICD-10-CM | POA: Diagnosis not present

## 2015-02-21 DIAGNOSIS — I472 Ventricular tachycardia: Secondary | ICD-10-CM | POA: Diagnosis not present

## 2015-02-21 DIAGNOSIS — M1711 Unilateral primary osteoarthritis, right knee: Secondary | ICD-10-CM | POA: Diagnosis not present

## 2015-02-21 DIAGNOSIS — M109 Gout, unspecified: Secondary | ICD-10-CM | POA: Diagnosis not present

## 2015-02-21 DIAGNOSIS — I10 Essential (primary) hypertension: Secondary | ICD-10-CM | POA: Diagnosis not present

## 2015-02-21 DIAGNOSIS — M25561 Pain in right knee: Secondary | ICD-10-CM

## 2015-02-21 DIAGNOSIS — Z95810 Presence of automatic (implantable) cardiac defibrillator: Secondary | ICD-10-CM | POA: Diagnosis not present

## 2015-02-21 DIAGNOSIS — I5022 Chronic systolic (congestive) heart failure: Secondary | ICD-10-CM | POA: Diagnosis not present

## 2015-02-21 MED ORDER — NAPROXEN 500 MG PO TABS: 500.0000 mg | ORAL_TABLET | Freq: Two times a day (BID) | ORAL | Status: DC

## 2015-02-21 MED ORDER — TRAMADOL HCL 50 MG PO TABS: 50.0000 mg | ORAL_TABLET | Freq: Four times a day (QID) | ORAL | Status: DC | PRN

## 2015-02-21 NOTE — Discharge Instructions (Signed)
Take pain medication as prescribed.  Do not drive or operate heavy machinery for 4-6 hours after taking medication.

## 2015-02-21 NOTE — ED Notes (Signed)
Pt with knee pain since Tuesday.  No injury noted.  States swollen and painful.  No recent travel.

## 2015-02-21 NOTE — ED Provider Notes (Signed)
CSN: [id number]     Arrival date & time 02/21/15  0903 History   First MD Initiated Contact with Patient 02/21/15 0915     Chief Complaint  Patient presents with  . Knee Pain     (Consider location/radiation/quality/duration/timing/severity/associated sxs/prior Treatment) HPI Comments: Patient with a history of Gout, Arthritis of the knees, and Morbid obesity presents today with knee pain.  Pain has been constant for the past 3 days.  He reports that the pain is similar to pain that he has had in the past.  He denies acute injury or trauma.  Pain is diffuse.  He has taken Motrin for the pain without relief.  Last dose was 6 PM last evening.  Pain worse with standing and ROM of the knee.  He reports associated swelling of the knee.  He denies fever, chills, warmth of the knee, or any pain or swelling of the calf.  The history is provided by the patient.    Past Medical History  Diagnosis Date  . Chronic systolic heart failure     Diagnosed 2001; prior patient of Dr. Linard Millers with [name] - discharged from practice  . Dilated cardiomyopathy     LVEF 10-20%  . Hyperlipidemia   . Essential hypertension, benign   . Morbid obesity   . Gout   . 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 Arthritis     knee's  . VT (ventricular tachycardia) 06/07/2011   Past Surgical History  Procedure Laterality Date  . Icd  10/27/2011  . Implantable cardioverter defibrillator implant N/A 10/27/2011    Procedure: IMPLANTABLE CARDIOVERTER DEFIBRILLATOR IMPLANT;  Surgeon: Deboraha Sprang, MD;  Location: [facility] CATH LAB;  Service: Cardiovascular;  Laterality: N/A;   Family History  Problem Relation Age of Onset  . Hypertension Mother    Social History  Substance Use Topics  . Smoking status: Never Smoker   . Smokeless tobacco: Never Used  . Alcohol Use: 0.0 oz/week    0 Standard drinks or equivalent per week     Comment: 1-2 beers on the weekend    Review of Systems  All other systems  reviewed and are negative.     Allergies  Beta adrenergic blockers and Isosorb dinitrate-hydralazine  Home Medications   Prior to Admission medications   Medication Sig Start Date End Date Taking? Authorizing Provider  acetaminophen (TYLENOL) 500 MG tablet Take 1,000 mg by mouth every 6 (six) hours as needed. For pain    Historical Provider, MD  allopurinol (ZYLOPRIM) 100 MG tablet Take 2 tablets (200 mg total) by mouth daily. 11/26/14   Deboraha Sprang, MD  carvedilol (COREG) 25 MG tablet Take 1 tablet (25 mg total) by mouth 2 (two) times daily. Needs office visit for further refills 02/14/15   Jolaine Artist, MD  diclofenac (VOLTAREN) 75 MG EC tablet Take 1 tablet (75 mg total) by mouth 2 (two) times daily. 12/03/14   Waldemar Dickens, MD  digoxin (LANOXIN) 0.25 MG tablet Take 1 tablet (250 mcg total) by mouth daily. 12/17/14   Jolaine Artist, MD  lisinopril (PRINIVIL,ZESTRIL) 20 MG tablet Take 0.5 tablets (10 mg total) by mouth daily. 11/26/14   Deboraha Sprang, MD  methocarbamol (ROBAXIN) 500 MG tablet Take 1-2 tablets (500-1,000 mg total) by mouth every 6 (six) hours as needed for muscle spasms. 12/03/14   Waldemar Dickens, MD  Naproxen Sodium (ALEVE PO) Take by mouth as needed.    Historical Provider, MD  spironolactone (ALDACTONE) 25 MG tablet Take 0.5 tablets (12.5 mg total) by mouth daily. 06/20/14   Amy D Clegg, NP  torsemide (DEMADEX) 20 MG tablet Take 3 tablets (60 mg total) by mouth daily. 01/21/15   Shaune Pascal Bensimhon, MD   BP 120/66 mmHg  Pulse 84  Temp(Src) 98.2 F (36.8 C) (Oral)  Resp 20  SpO2 98% Physical Exam  Constitutional: He appears well-developed and well-nourished.  Patient morbidly obese  HENT:  Head: Normocephalic and atraumatic.  Neck: Normal range of motion. Neck supple.  Cardiovascular: Normal rate, regular rhythm and normal heart sounds.   Pulmonary/Chest: Effort normal and breath sounds normal.  Musculoskeletal: Normal range of motion.  No erythema,  edema, or warmth of the right knee.  Full ROM of the right knee, but increased pain with ROM.  Neurological: He is alert.  Distal sensation of the right foot intact  Skin: Skin is warm and dry.  Psychiatric: He has a normal mood and affect.  Nursing note and vitals reviewed.   ED Course  Procedures (including critical care time) Labs Review Labs Reviewed - No data to display  Imaging Review No results found. I have personally reviewed and evaluated these images and lab results as part of my medical decision-making.   EKG Interpretation None      MDM   Final diagnoses:  None   Patient is a 385 lb male with history of OA who presents today with right knee pain.  No acute injury or trauma.  No signs of infection on exam.  Patient afebrile.  Patient with full ROM of the knee.   Therefore, doubt septic joint or gout.  Patient ambulatory on exam.  Stable for discharge.  Return precautions given.      Hyman Bible, PA-C 02/21/15 [place], MD 02/21/15 [phone]

## 2015-02-21 NOTE — ED Notes (Signed)
Pt has been evaluated by PA La[place] and is appropriate for discharge.  Right new with moderate swelling. Wheelchair used at d/c.

## 2015-02-25 ENCOUNTER — Encounter: Payer: Self-pay | Admitting: Internal Medicine

## 2015-02-25 ENCOUNTER — Ambulatory Visit (INDEPENDENT_AMBULATORY_CARE_PROVIDER_SITE_OTHER): Payer: Medicare Other | Admitting: *Deleted

## 2015-02-25 DIAGNOSIS — I472 Ventricular tachycardia, unspecified: Secondary | ICD-10-CM

## 2015-02-25 NOTE — Progress Notes (Signed)
Remote ICD transmission.   

## 2015-03-06 LAB — CUP PACEART REMOTE DEVICE CHECK
Battery Voltage: 3.1 V
Brady Statistic AP VP Percent: 0 %
Brady Statistic RA Percent Paced: 0 %
Date Time Interrogation Session: 20160913134058
HIGH POWER IMPEDANCE MEASURED VALUE: 304 Ohm
HIGH POWER IMPEDANCE MEASURED VALUE: 46 Ohm
HIGH POWER IMPEDANCE MEASURED VALUE: 58 Ohm
Implantable Lead Implant Date: 20130515
Implantable Lead Location: 753860
Implantable Lead Model: 7121
Lead Channel Impedance Value: 399 Ohm
Lead Channel Impedance Value: 4047 Ohm
Lead Channel Impedance Value: 4047 Ohm
Lead Channel Pacing Threshold Amplitude: 1 V
Lead Channel Sensing Intrinsic Amplitude: 10.75 mV
Lead Channel Sensing Intrinsic Amplitude: 10.75 mV
Lead Channel Setting Pacing Amplitude: 2.5 V
Lead Channel Setting Pacing Pulse Width: 0.4 ms
MDC IDC MSMT LEADCHNL LV IMPEDANCE VALUE: 4047 Ohm
MDC IDC MSMT LEADCHNL RA IMPEDANCE VALUE: 4047 Ohm
MDC IDC MSMT LEADCHNL RA SENSING INTR AMPL: 0.375 mV
MDC IDC MSMT LEADCHNL RA SENSING INTR AMPL: 0.375 mV
MDC IDC MSMT LEADCHNL RV PACING THRESHOLD PULSEWIDTH: 0.4 ms
MDC IDC SET LEADCHNL RV SENSING SENSITIVITY: 0.3 mV
MDC IDC SET ZONE DETECTION INTERVAL: 250 ms
MDC IDC STAT BRADY AP VS PERCENT: 0 %
MDC IDC STAT BRADY AS VP PERCENT: 0.01 %
MDC IDC STAT BRADY AS VS PERCENT: 99.99 %
MDC IDC STAT BRADY RV PERCENT PACED: 0.01 %
Zone Setting Detection Interval: 300 ms
Zone Setting Detection Interval: 350 ms
Zone Setting Detection Interval: 350 ms

## 2015-03-25 ENCOUNTER — Encounter: Payer: Self-pay | Admitting: Cardiology

## 2015-04-17 ENCOUNTER — Ambulatory Visit (HOSPITAL_COMMUNITY)
Admission: RE | Admit: 2015-04-17 | Discharge: 2015-04-17 | Disposition: A | Discharge status: Home / Self Care | Payer: Medicare Other | Source: Ambulatory Visit | Attending: Internal Medicine | Admitting: Internal Medicine

## 2015-04-17 VITALS — BP 118/74 | HR 88 | Wt 378.1 lb

## 2015-04-17 DIAGNOSIS — E785 Hyperlipidemia, unspecified: Secondary | ICD-10-CM | POA: Insufficient documentation

## 2015-04-17 DIAGNOSIS — I5022 Chronic systolic (congestive) heart failure: Secondary | ICD-10-CM | POA: Diagnosis present

## 2015-04-17 DIAGNOSIS — Z95810 Presence of automatic (implantable) cardiac defibrillator: Secondary | ICD-10-CM | POA: Insufficient documentation

## 2015-04-17 DIAGNOSIS — Z79899 Other long term (current) drug therapy: Secondary | ICD-10-CM | POA: Insufficient documentation

## 2015-04-17 DIAGNOSIS — M109 Gout, unspecified: Secondary | ICD-10-CM | POA: Insufficient documentation

## 2015-04-17 DIAGNOSIS — M25562 Pain in left knee: Secondary | ICD-10-CM | POA: Insufficient documentation

## 2015-04-17 DIAGNOSIS — M25561 Pain in right knee: Secondary | ICD-10-CM | POA: Insufficient documentation

## 2015-04-17 DIAGNOSIS — I110 Hypertensive heart disease with heart failure: Secondary | ICD-10-CM | POA: Insufficient documentation

## 2015-04-17 DIAGNOSIS — I5042 Chronic combined systolic (congestive) and diastolic (congestive) heart failure: Secondary | ICD-10-CM

## 2015-04-17 MED ORDER — SACUBITRIL-VALSARTAN 24-26 MG PO TABS: 1.0000 | ORAL_TABLET | Freq: Two times a day (BID) | ORAL | Status: DC

## 2015-04-17 NOTE — Progress Notes (Signed)
Advanced Heart Failure Medication Review by a Pharmacist  Does the patient  feel that his/her medications are working for him/her?  yes  Has the patient been experiencing any side effects to the medications prescribed?  no  Does the patient measure his/her own blood pressure or blood glucose at home?  yes   Does the patient have any problems obtaining medications due to transportation or finances?   no  Understanding of regimen: good Understanding of indications: good Potential of compliance: good Patient understands to avoid NSAIDs - reviewed the importance of utilizing APAP for mild aches/pains instead of NSAIDs Patient understands to avoid decongestants.  Issues to address at subsequent visits: None   Pharmacist comments:  William Davila is a pleasant 49 yo M presenting without a medication list. He reports good compliance with his medications and he did not have any specific medication-related questions or concerns for me at this time.   Ruta Hinds. Velva Harman, PharmD, BCPS, CPP Clinical Pharmacist Pager: [phone] Phone: [phone] 04/17/2015 3:14 PM    Time with patient: 4 minutes Preparation and documentation time: 2 minutes Total time: 6 minutes

## 2015-04-17 NOTE — Patient Instructions (Signed)
Stop Lisinopril   Start Entresto 24/26 mg Twice daily STARTING ON Sunday 11/6 AM  Lab in 1 week  Your physician recommends that you schedule a follow-up appointment in: 3-4 weeks

## 2015-04-17 NOTE — Progress Notes (Signed)
Patient ID: William Davila, male   DOB: 16-Dec-1965, 49 y.o.   MRN: [id number]    Ortho: Dr Nori Riis  PCP: Dr Mechele Claude.  EP: Dr Caryl Comes  HF MD: Dr Haroldine Laws HPI: [name]  is a 49 year old AA male with systolic heart failure , cardiolmyopathy diagnosed 2001, hyperlipidemia, HTN, and obesity. S/P St Jude single chamber ICD 10/2011. Intolerant Bidil due to headaches.    Discharged from [facility] 05/10/2011 Discharge weight 328.  Massive volume over load and low output. Cardiac output increased inotropes. Medications initiated ace, beta blocker , digoxin, and torsemide.   He is returns for follow up. Denies SOB/PND/Orthopnea.  No shocks.  He has difficulty with steps due to bilateral knee pain. Limited exercise due to knee pain. Weight at home 378-379 pound.   Big appetite. Complaint with medications. Lives with grandfather.    ECHO 08/2011 EF 20-25% ECHO 06/20/14 EF 25-30% RV normal   Labs 06/04/14 K 3.6 Creatinine 0.96.   ROS: All systems negative except as listed in HPI, PMH and Problem List.  Past Medical History  Diagnosis Date  . Chronic systolic heart failure     Diagnosed 2001; prior patient of Dr. Linard Millers with [name] - discharged from practice  . Dilated cardiomyopathy     LVEF 10-20%  . Hyperlipidemia   . Essential hypertension, benign   . Morbid obesity   . Gout   . 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 Arthritis     knee's  . VT (ventricular tachycardia) 06/07/2011    Current Outpatient Prescriptions  Medication Sig Dispense Refill  . allopurinol (ZYLOPRIM) 100 MG tablet Take 2 tablets (200 mg total) by mouth daily. 60 tablet 6  . carvedilol (COREG) 25 MG tablet Take 1 tablet (25 mg total) by mouth 2 (two) times daily. Needs office visit for further refills 60 tablet 3  . digoxin (LANOXIN) 0.25 MG tablet Take 1 tablet (250 mcg total) by mouth daily. 30 tablet 3  . lisinopril (PRINIVIL,ZESTRIL) 20 MG tablet Take 0.5 tablets (10 mg total) by mouth daily. 90  tablet 3  . methocarbamol (ROBAXIN) 500 MG tablet Take 1-2 tablets (500-1,000 mg total) by mouth every 6 (six) hours as needed for muscle spasms. 60 tablet 0  . spironolactone (ALDACTONE) 25 MG tablet Take 0.5 tablets (12.5 mg total) by mouth daily. 90 tablet 3  . torsemide (DEMADEX) 20 MG tablet Take 3 tablets (60 mg total) by mouth daily. 90 tablet 6  . acetaminophen (TYLENOL) 500 MG tablet Take 1,000 mg by mouth every 6 (six) hours as needed. For pain     No current facility-administered medications for this encounter.     PHYSICAL EXAM: Filed Vitals:   04/17/15 1451  BP: 118/74  Pulse: 88  Weight 378   General:  Well appearing. No resp difficulty HEENT: normal Neck: supple. JVP 5-6. Carotids 2+ bilaterally; no bruits. No lymphadenopathy or thryomegaly appreciated. Cor: PMI normal. Regular rate & rhythm. No rubs, S3  or murmurs. Lungs: clear Abdomen: obese, soft, nontender, nondistended. No hepatosplenomegaly. No bruits or masses. Good bowel sounds. Extremities: no cyanosis, clubbing, rash, edema Neuro: alert & orientedx3, cranial nerves grossly intact. Moves all 4 extremities w/o difficulty. Affect pleasant.   ASSESSMENT & PLAN: 1. Chronic Systolic heart Failure- S/P ST Jude ICD 10/2011  January 2016 ECHO today EF 25-30% NYHA II. Volume status stable. Continue torsemide 60 mg daily On goal carvedilol 25 mg twice a day. Continue digoxin 0.25 mg daily (  dig level 0.9 05/2013)  -Stop lisinopril allow 36 hour washout. Start entresto 24-26 mg twice a day.  -Continue 12.5 mg spironolactone daily. -Check BMET next week.  . Reinforced daily weights, low salt food. Needs to weigh and record daily, limiting fluid intake to < 2 liters per day and make low salt food choices.  2. Obesity-  Needs to cut back on his portions.   Follow up in 3 months   Amy Clegg NP-C  3:09 PM  Patient seen and examined with Darrick Grinder, NP. We discussed all aspects of the encounter. I agree with the  assessment and plan as stated above.   Doing well. Volume status looks good. Agree with switch to [facility].   Chane Magner,MD 3:14 PM

## 2015-04-18 ENCOUNTER — Other Ambulatory Visit (HOSPITAL_COMMUNITY): Payer: Self-pay | Admitting: Cardiology

## 2015-04-18 DIAGNOSIS — I5022 Chronic systolic (congestive) heart failure: Secondary | ICD-10-CM

## 2015-04-18 MED ORDER — DIGOXIN 250 MCG PO TABS: 250.0000 ug | ORAL_TABLET | Freq: Every day | ORAL | Status: DC

## 2015-04-21 ENCOUNTER — Other Ambulatory Visit (HOSPITAL_COMMUNITY): Payer: Self-pay | Admitting: *Deleted

## 2015-04-23 ENCOUNTER — Other Ambulatory Visit (HOSPITAL_COMMUNITY): Payer: Medicare Other

## 2015-04-30 ENCOUNTER — Ambulatory Visit (HOSPITAL_COMMUNITY)
Admission: RE | Admit: 2015-04-30 | Discharge: 2015-04-30 | Disposition: A | Discharge status: Home / Self Care | Payer: Medicare Other | Source: Ambulatory Visit | Attending: Cardiology | Admitting: Cardiology

## 2015-04-30 DIAGNOSIS — I5042 Chronic combined systolic (congestive) and diastolic (congestive) heart failure: Secondary | ICD-10-CM | POA: Insufficient documentation

## 2015-04-30 LAB — BASIC METABOLIC PANEL
Anion gap: 9 (ref 5–15)
BUN: 12 mg/dL (ref 6–20)
CALCIUM: 9.1 mg/dL (ref 8.9–10.3)
CO2: 29 mmol/L (ref 22–32)
CREATININE: 1.01 mg/dL (ref 0.61–1.24)
Chloride: 99 mmol/L — ABNORMAL LOW (ref 101–111)
GFR calc Af Amer: >60 mL/min (ref >60)
GFR calc non Af Amer: >60 mL/min (ref >60)
GLUCOSE: 134 mg/dL — AB (ref 65–99)
Potassium: 3 mmol/L — ABNORMAL LOW (ref 3.5–5.1)
Sodium: 137 mmol/L (ref 135–145)

## 2015-05-01 ENCOUNTER — Telehealth (HOSPITAL_COMMUNITY): Payer: Self-pay | Admitting: *Deleted

## 2015-05-01 MED ORDER — POTASSIUM CHLORIDE CRYS ER 20 MEQ PO TBCR: 20.0000 meq | EXTENDED_RELEASE_TABLET | Freq: Every day | ORAL | Status: DC

## 2015-05-01 MED ORDER — SPIRONOLACTONE 25 MG PO TABS
25.0000 mg | ORAL_TABLET | Freq: Every day | ORAL | Status: DC
Start: 2015-05-01 — End: 2015-07-08

## 2015-05-01 NOTE — Telephone Encounter (Signed)
-----   Message from Scarlette Calico, RN sent at 05/01/2015  4:18 PM EST ----- Discussed w/Dr Haroldine Laws, he advised pt should increase Arlyce Harman to 25 mg daily and start KCL 20 meq daily, repeat bmet in 1 week.  Attempted to call pt and Left message to call back

## 2015-05-01 NOTE — Telephone Encounter (Signed)
Notes Recorded by Scarlette Calico, RN on 05/01/2015 at 4:58 PM Pt aware, agreeable and verbalizes understanding

## 2015-05-07 ENCOUNTER — Ambulatory Visit (HOSPITAL_COMMUNITY)
Admission: RE | Admit: 2015-05-07 | Discharge: 2015-05-07 | Disposition: A | Discharge status: Home / Self Care | Payer: Medicare Other | Source: Ambulatory Visit | Attending: Internal Medicine | Admitting: Internal Medicine

## 2015-05-07 DIAGNOSIS — I5042 Chronic combined systolic (congestive) and diastolic (congestive) heart failure: Secondary | ICD-10-CM | POA: Diagnosis not present

## 2015-05-07 LAB — BASIC METABOLIC PANEL
ANION GAP: 10 (ref 5–15)
BUN: 13 mg/dL (ref 6–20)
CALCIUM: 9 mg/dL (ref 8.9–10.3)
CO2: 26 mmol/L (ref 22–32)
Chloride: 103 mmol/L (ref 101–111)
Creatinine, Ser: 1.14 mg/dL (ref 0.61–1.24)
Glucose, Bld: 129 mg/dL — ABNORMAL HIGH (ref 65–99)
Potassium: 3.4 mmol/L — ABNORMAL LOW (ref 3.5–5.1)
Sodium: 139 mmol/L (ref 135–145)

## 2015-05-14 ENCOUNTER — Encounter: Payer: Self-pay | Admitting: Internal Medicine

## 2015-05-15 ENCOUNTER — Ambulatory Visit (HOSPITAL_COMMUNITY)
Admission: RE | Admit: 2015-05-15 | Discharge: 2015-05-15 | Disposition: A | Discharge status: Home / Self Care | Payer: Medicare Other | Source: Ambulatory Visit | Attending: Internal Medicine | Admitting: Internal Medicine

## 2015-05-15 ENCOUNTER — Encounter (HOSPITAL_COMMUNITY): Payer: Self-pay | Admitting: Internal Medicine

## 2015-05-15 VITALS — BP 116/70 | HR 86 | Wt 385.5 lb

## 2015-05-15 DIAGNOSIS — E669 Obesity, unspecified: Secondary | ICD-10-CM | POA: Insufficient documentation

## 2015-05-15 DIAGNOSIS — I5022 Chronic systolic (congestive) heart failure: Secondary | ICD-10-CM | POA: Insufficient documentation

## 2015-05-15 DIAGNOSIS — I5042 Chronic combined systolic (congestive) and diastolic (congestive) heart failure: Secondary | ICD-10-CM | POA: Diagnosis not present

## 2015-05-15 MED ORDER — SACUBITRIL-VALSARTAN 49-51 MG PO TABS: 1.0000 | ORAL_TABLET | Freq: Two times a day (BID) | ORAL | Status: DC

## 2015-05-15 NOTE — Progress Notes (Signed)
Patient ID: William Davila, male   DOB: 1965/11/24, 49 y.o.   MRN: [id number]     Ortho: Dr Nori Riis  PCP: Dr Mechele Claude.  EP: Dr Caryl Comes  HF MD: Dr Haroldine Laws  HPI: William Davila  is a 49 year old AA male with systolic heart failure , cardiolmyopathy diagnosed 2001, hyperlipidemia, HTN, and obesity. S/P St Jude single chamber ICD 10/2011. Intolerant Bidil due to headaches.    Discharged from [facility] 05/10/2011 Discharge weight 328.  Massive volume over load and low output. Cardiac output increased inotropes. Medications initiated ace, beta blocker , digoxin, and torsemide.   He is returns for follow up. Last visit lisinopril stopped and entresto started. Denies SOB/PND/Orthopnea.  No shocks.  He has difficulty with steps due to bilateral knee pain. Limited exercise due to knee pain. Not weighing at home. Lives with grandfather.    ECHO 08/2011 EF 20-25% ECHO 06/20/14 EF 25-30% RV normal   Labs 06/04/14 K 3.6 Creatinine 0.96.  Labs 04/30/2015: 3.0  Labs 05/07/2015 K 3.4 Creatinine  ROS: All systems negative except as listed in HPI, PMH and Problem List.  Past Medical History  Diagnosis Date  . Chronic systolic heart failure ([place])     Diagnosed 2001; prior patient of Dr. Linard Millers [name] - discharged from practice  . Dilated cardiomyopathy ([facility])     LVEF 10-20%  . Hyperlipidemia   . Essential hypertension, benign   . Morbid obesity ([place])   . Gout   . 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 Arthritis     knee's  . VT (ventricular tachycardia) ([place]) 06/07/2011    Current Outpatient Prescriptions  Medication Sig Dispense Refill  . acetaminophen (TYLENOL) 500 MG tablet Take 1,000 mg by mouth every 6 (six) hours as needed. For pain    . allopurinol (ZYLOPRIM) 100 MG tablet Take 2 tablets (200 mg total) by mouth daily. 60 tablet 6  . carvedilol (COREG) 25 MG tablet Take 1 tablet (25 mg total) by mouth 2 (two) times daily. Needs office visit for further refills 60 tablet 3  .  digoxin (LANOXIN) 0.25 MG tablet Take 1 tablet (250 mcg total) by mouth daily. 30 tablet 3  . methocarbamol (ROBAXIN) 500 MG tablet Take 1-2 tablets (500-1,000 mg total) by mouth every 6 (six) hours as needed for muscle spasms. 60 tablet 0  . potassium chloride SA (K-DUR,KLOR-CON) 20 MEQ tablet Take 20 mEq by mouth 2 (two) times daily.    . sacubitril-valsartan (ENTRESTO) 24-26 MG Take 1 tablet by mouth 2 (two) times daily. 60 tablet 3  . spironolactone (ALDACTONE) 25 MG tablet Take 1 tablet (25 mg total) by mouth daily. 90 tablet 3  . torsemide (DEMADEX) 20 MG tablet Take 3 tablets (60 mg total) by mouth daily. 90 tablet 6   No current facility-administered medications for this encounter.     PHYSICAL EXAM: Filed Vitals:   05/15/15 1351  BP: 116/70  Pulse: 86  Weight 378 >385 pounds   General:  Well appearing. No resp difficulty HEENT: normal Neck: supple. JVP 8-9. Carotids 2+ bilaterally; no bruits. No lymphadenopathy or thryomegaly appreciated. Cor: PMI normal. Regular rate & rhythm. No rubs, S3  or murmurs. Lungs: clear Abdomen: obese, soft, nontender, nondistended. No hepatosplenomegaly. No bruits or masses. Good bowel sounds. Extremities: no cyanosis, clubbing, rash, edema Neuro: alert & orientedx3, cranial nerves grossly intact. Moves all 4 extremities w/o difficulty. Affect pleasant.   ASSESSMENT & PLAN: 1. Chronic Systolic heart Failure-  [employer]onic Single lead ICD5/2013  January 2016 ECHO EF 25-30%  -NYHA II. Volume status stable. Continue torsemide 60 mg daily On goal carvedilol 25 mg twice a day. Continue digoxin 0.25 mg daily (dig level 0.9 05/2013)  -Increase entresto 49-51  mg twice a day. Check BMET next week  -Continue 12.5 mg spironolactone daily. . Reinforced daily weights, low salt food. Needs to weigh and record daily, limiting fluid intake to < 2 liters per day and make low salt food choices.  2. Obesity-  Needs to cut back on his portions.   Follow up in 3  months   Amy Clegg NP-C  2:19 PM  Patient seen and examined with Darrick Grinder, NP. We discussed all aspects of the encounter. I agree with the assessment and plan as stated above.   Overall doing fairly well. NYHA II. Volume status slightly up. Will increase Entresto to 49/51 bid. ICD interrogated personally in clinic. Volume status rising slightly but still below threshold. No VT. Mild artifact on AF reading and detection sensitivity decreased. Reinforced need for daily weights and reviewed use of sliding scale diuretics.  Bensimhon, Daniel,MD 6:26 PM

## 2015-05-15 NOTE — Progress Notes (Signed)
Medication Samples have been provided to the patient.  Drug name: Delene Loll 49/51  Qty: 2  LOT: [id number]  Exp.Date: Feb 2017  The patient has been instructed regarding the correct time, dose, and frequency of taking this medication, including desired effects and most common side effects.   William Davila 2:41 PM 05/15/2015

## 2015-05-15 NOTE — Patient Instructions (Signed)
Increase Entresto to 49/51 mg Twice daily   Labs in 1 week  We will contact you in 4 months to schedule your next appointment.

## 2015-05-15 NOTE — Progress Notes (Signed)
Advanced Heart Failure Medication Review by a Pharmacist  Does the patient  feel that his/her medications are working for him/her?  yes  Has the patient been experiencing any side effects to the medications prescribed?  no  Does the patient measure his/her own blood pressure or blood glucose at home?  no   Does the patient have any problems obtaining medications due to transportation or finances?   no  Understanding of regimen: good Understanding of indications: good Potential of compliance: good Patient understands to avoid NSAIDs. Patient understands to avoid decongestants.  Issues to address at subsequent visits: None   Pharmacist comments:  Mr. Guiden is a pleasant 49 yo M presenting without a medication list but with great recall of his regimen including dosages. He reports good compliance with his medications and did not have any specific medication-related questions or concerns for me at this time.   Ruta Hinds. Velva Harman, PharmD, BCPS, CPP Clinical Pharmacist Pager: [phone] Phone: [phone] 05/15/2015 2:00 PM    Time with patient: 6 minutes Preparation and documentation time: 2 minutes Total time: 8 minutes

## 2015-05-19 ENCOUNTER — Telehealth (HOSPITAL_COMMUNITY): Payer: Self-pay | Admitting: Pharmacist

## 2015-05-19 NOTE — Telephone Encounter (Signed)
Entresto PA approved by [employer] through 05/14/2016. Pharmacy verified copay $3.60/mo.   Ruta Hinds. Velva Harman, PharmD, BCPS, CPP Clinical Pharmacist Pager: [phone] Phone: [phone] 05/19/2015 2:17 PM

## 2015-05-26 ENCOUNTER — Ambulatory Visit (HOSPITAL_COMMUNITY)
Admission: RE | Admit: 2015-05-26 | Discharge: 2015-05-26 | Disposition: A | Discharge status: Home / Self Care | Payer: Medicare Other | Source: Ambulatory Visit | Attending: Cardiology | Admitting: Cardiology

## 2015-05-26 DIAGNOSIS — I5042 Chronic combined systolic (congestive) and diastolic (congestive) heart failure: Secondary | ICD-10-CM | POA: Diagnosis not present

## 2015-05-26 LAB — BASIC METABOLIC PANEL
Anion gap: 8 (ref 5–15)
BUN: 20 mg/dL (ref 6–20)
CALCIUM: 8.9 mg/dL (ref 8.9–10.3)
CO2: 25 mmol/L (ref 22–32)
CREATININE: 1.21 mg/dL (ref 0.61–1.24)
Chloride: 103 mmol/L (ref 101–111)
GFR calc Af Amer: >60 mL/min (ref >60)
GFR calc non Af Amer: >60 mL/min (ref >60)
GLUCOSE: 115 mg/dL — AB (ref 65–99)
Potassium: 3.8 mmol/L (ref 3.5–5.1)
Sodium: 136 mmol/L (ref 135–145)

## 2015-05-27 ENCOUNTER — Ambulatory Visit (INDEPENDENT_AMBULATORY_CARE_PROVIDER_SITE_OTHER): Payer: Medicare Other | Admitting: *Deleted

## 2015-05-27 DIAGNOSIS — I472 Ventricular tachycardia, unspecified: Secondary | ICD-10-CM

## 2015-05-27 DIAGNOSIS — I5022 Chronic systolic (congestive) heart failure: Secondary | ICD-10-CM

## 2015-05-27 NOTE — Progress Notes (Signed)
Remote ICD transmission.   

## 2015-05-31 LAB — CUP PACEART REMOTE DEVICE CHECK
Battery Voltage: 3.07 V
Brady Statistic AP VP Percent: 0 %
Brady Statistic AP VS Percent: 0 %
Brady Statistic AS VP Percent: 0 %
Brady Statistic AS VS Percent: 100 %
HighPow Impedance: 304 Ohm
HighPow Impedance: 48 Ohm
HighPow Impedance: 64 Ohm
Lead Channel Impedance Value: 399 Ohm
Lead Channel Impedance Value: 4047 Ohm
Lead Channel Impedance Value: 4047 Ohm
Lead Channel Impedance Value: 4047 Ohm
Lead Channel Impedance Value: 4047 Ohm
Lead Channel Sensing Intrinsic Amplitude: 0.5 mV
Lead Channel Sensing Intrinsic Amplitude: 0.5 mV
Lead Channel Setting Pacing Amplitude: 2.5 V
Lead Channel Setting Pacing Pulse Width: 0.4 ms
MDC IDC LEAD IMPLANT DT: 20130515
MDC IDC LEAD LOCATION: 753860
MDC IDC LEAD MODEL: 7121
MDC IDC MSMT LEADCHNL RV PACING THRESHOLD AMPLITUDE: 1 V
MDC IDC MSMT LEADCHNL RV PACING THRESHOLD PULSEWIDTH: 0.4 ms
MDC IDC MSMT LEADCHNL RV SENSING INTR AMPL: 10.125 mV
MDC IDC MSMT LEADCHNL RV SENSING INTR AMPL: 10.125 mV
MDC IDC SESS DTM: 20161213153356
MDC IDC SET LEADCHNL RV SENSING SENSITIVITY: 0.3 mV
MDC IDC STAT BRADY RA PERCENT PACED: 0 %
MDC IDC STAT BRADY RV PERCENT PACED: 0 %

## 2015-06-04 ENCOUNTER — Encounter: Payer: Self-pay | Admitting: Cardiology

## 2015-06-05 ENCOUNTER — Encounter: Payer: Self-pay | Admitting: Internal Medicine

## 2015-06-23 ENCOUNTER — Other Ambulatory Visit (HOSPITAL_COMMUNITY): Payer: Self-pay | Admitting: *Deleted

## 2015-06-23 DIAGNOSIS — I5022 Chronic systolic (congestive) heart failure: Secondary | ICD-10-CM

## 2015-06-23 MED ORDER — CARVEDILOL 25 MG PO TABS: 25.0000 mg | ORAL_TABLET | Freq: Two times a day (BID) | ORAL | Status: DC

## 2015-06-26 ENCOUNTER — Other Ambulatory Visit: Payer: Self-pay | Admitting: Internal Medicine

## 2015-06-26 NOTE — Telephone Encounter (Signed)
Ok to refill? Please advise. Thanks, MI 

## 2015-06-26 NOTE — Telephone Encounter (Signed)
This will need to be refilled through his PCP.  Thanks!

## 2015-07-08 ENCOUNTER — Other Ambulatory Visit (HOSPITAL_COMMUNITY): Payer: Self-pay | Admitting: *Deleted

## 2015-07-08 MED ORDER — SPIRONOLACTONE 25 MG PO TABS: 25.0000 mg | ORAL_TABLET | Freq: Every day | ORAL | Status: DC

## 2015-08-18 ENCOUNTER — Other Ambulatory Visit (HOSPITAL_COMMUNITY): Payer: Self-pay | Admitting: *Deleted

## 2015-08-18 DIAGNOSIS — I5022 Chronic systolic (congestive) heart failure: Secondary | ICD-10-CM

## 2015-08-18 MED ORDER — DIGOXIN 250 MCG PO TABS: 250.0000 ug | ORAL_TABLET | Freq: Every day | ORAL | Status: DC

## 2015-08-20 ENCOUNTER — Other Ambulatory Visit (HOSPITAL_COMMUNITY): Payer: Self-pay | Admitting: *Deleted

## 2015-08-20 MED ORDER — TORSEMIDE 20 MG PO TABS: 60.0000 mg | ORAL_TABLET | Freq: Every day | ORAL | Status: DC

## 2015-08-26 ENCOUNTER — Ambulatory Visit (INDEPENDENT_AMBULATORY_CARE_PROVIDER_SITE_OTHER): Payer: Medicare Other | Admitting: *Deleted

## 2015-08-26 DIAGNOSIS — I472 Ventricular tachycardia, unspecified: Secondary | ICD-10-CM

## 2015-08-26 DIAGNOSIS — I5022 Chronic systolic (congestive) heart failure: Secondary | ICD-10-CM

## 2015-08-26 NOTE — Progress Notes (Signed)
Remote ICD transmission.   

## 2015-09-05 LAB — CUP PACEART REMOTE DEVICE CHECK
Brady Statistic AP VP Percent: 0 %
Brady Statistic AP VS Percent: 0 %
Brady Statistic AS VP Percent: 0 %
Brady Statistic AS VS Percent: 100 %
Brady Statistic RA Percent Paced: 0 %
Brady Statistic RV Percent Paced: 0 %
HighPow Impedance: 304 Ohm
HighPow Impedance: 55 Ohm
HighPow Impedance: 70 Ohm
Implantable Lead Implant Date: 20130515
Implantable Lead Location: 753860
Implantable Lead Model: 7121
Lead Channel Impedance Value: 4047 Ohm
Lead Channel Impedance Value: 4047 Ohm
Lead Channel Pacing Threshold Amplitude: 0.875 V
Lead Channel Pacing Threshold Pulse Width: 0.4 ms
Lead Channel Sensing Intrinsic Amplitude: 0.5 mV
Lead Channel Sensing Intrinsic Amplitude: 8.875 mV
Lead Channel Setting Pacing Amplitude: 2.5 V
Lead Channel Setting Sensing Sensitivity: 0.3 mV
MDC IDC MSMT BATTERY VOLTAGE: 3.09 V
MDC IDC MSMT LEADCHNL LV IMPEDANCE VALUE: 4047 Ohm
MDC IDC MSMT LEADCHNL LV IMPEDANCE VALUE: 4047 Ohm
MDC IDC MSMT LEADCHNL RA SENSING INTR AMPL: 0.5 mV
MDC IDC MSMT LEADCHNL RV IMPEDANCE VALUE: 399 Ohm
MDC IDC MSMT LEADCHNL RV SENSING INTR AMPL: 8.875 mV
MDC IDC SESS DTM: 20170314062828
MDC IDC SET LEADCHNL RV PACING PULSEWIDTH: 0.4 ms

## 2015-09-08 ENCOUNTER — Encounter: Payer: Self-pay | Admitting: Cardiology

## 2015-09-25 ENCOUNTER — Encounter (HOSPITAL_COMMUNITY): Payer: Self-pay | Admitting: Emergency Medicine

## 2015-09-25 ENCOUNTER — Ambulatory Visit (HOSPITAL_COMMUNITY)
Admission: EM | Admit: 2015-09-25 | Discharge: 2015-09-25 | Disposition: A | Discharge status: Home / Self Care | Payer: Medicare Other | Attending: Family Medicine | Admitting: Family Medicine

## 2015-09-25 DIAGNOSIS — M10031 Idiopathic gout, right wrist: Secondary | ICD-10-CM | POA: Diagnosis not present

## 2015-09-25 DIAGNOSIS — M109 Gout, unspecified: Secondary | ICD-10-CM

## 2015-09-25 MED ORDER — NAPROXEN 500 MG PO TABS: 500.0000 mg | ORAL_TABLET | Freq: Two times a day (BID) | ORAL | Status: DC

## 2015-09-25 NOTE — ED Notes (Signed)
here with possible gout flare up to right hand/wrist Started 4 days ago, throb, burning pain reported Tried taking Ibuprofen for relief

## 2015-09-25 NOTE — Discharge Instructions (Signed)
Gout °Gout is when your joints become red, sore, and swell (inflamed). This is caused by the buildup of uric acid crystals in the joints. Uric acid is a chemical that is normally in the blood. If the level of uric acid gets too high in the blood, these crystals form in your joints and tissues. Over time, these crystals can form into masses near the joints and tissues. These masses can destroy bone and cause the bone to look misshapen (deformed). °HOME CARE  °· Do not take aspirin for pain. °· Only take medicine as told by your doctor. °· Rest the joint as much as you can. When in bed, keep sheets and blankets off painful areas. °· Keep the sore joints raised (elevated). °· Put warm or cold packs on painful joints. Use of warm or cold packs depends on which works best for you. °· Use crutches if the painful joint is in your leg. °· Drink enough fluids to keep your pee (urine) clear or pale yellow. Limit alcohol, sugary drinks, and drinks with fructose in them. °· Follow your diet instructions. Pay careful attention to how much protein you eat. Include fruits, vegetables, whole grains, and fat-free or low-fat milk products in your daily diet. Talk to your doctor or dietitian about the use of coffee, vitamin C, and cherries. These may help lower uric acid levels. °· Keep a healthy body weight. °GET HELP RIGHT AWAY IF:  °· You have watery poop (diarrhea), throw up (vomit), or have any side effects from medicines. °· You do not feel better in 24 hours, or you are getting worse. °· Your joint becomes suddenly more tender, and you have chills or a fever. °MAKE SURE YOU:  °· Understand these instructions. °· Will watch your condition. °· Will get help right away if you are not doing well or get worse. °  °This information is not intended to replace advice given to you by your health care provider. Make sure you discuss any questions you have with your health care provider. °  °Document Released: 03/09/2008 Document Revised:  06/21/2014 Document Reviewed: 01/12/2012 °Elsevier Interactive Patient Education ©2016 Elsevier Inc[employer] ° °

## 2015-09-25 NOTE — ED Provider Notes (Signed)
CSN: [id number]     Arrival date & time 09/25/15  1336 History   First MD Initiated Contact with Patient 09/25/15 1452     Chief Complaint  Patient presents with  . Gout   (Consider location/radiation/quality/duration/timing/severity/associated sxs/prior Treatment) HPI History of right wrist pain for 4 days now. Just like previous gout pain. No trauma. Using ibuprofen without relief of symptoms. States prednisone does not work for him. Naproxen works much better.  Past Medical History  Diagnosis Date  . Chronic systolic heart failure ([place])     Diagnosed 2001; prior patient of Dr. Linard Millers with [name] - discharged from practice  . Dilated cardiomyopathy ([facility])     LVEF 10-20%  . Hyperlipidemia   . Essential hypertension, benign   . Morbid obesity ([place])   . Gout   . 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 Arthritis     knee's  . VT (ventricular tachycardia) ([place]) 06/07/2011   Past Surgical History  Procedure Laterality Date  . Icd  10/27/2011  . Implantable cardioverter defibrillator implant N/A 10/27/2011    Procedure: IMPLANTABLE CARDIOVERTER DEFIBRILLATOR IMPLANT;  Surgeon: Deboraha Sprang, MD;  Location: [facility] CATH LAB;  Service: Cardiovascular;  Laterality: N/A;   Family History  Problem Relation Age of Onset  . Hypertension Mother    Social History  Substance Use Topics  . Smoking status: Never Smoker   . Smokeless tobacco: Never Used  . Alcohol Use: 0.0 oz/week    0 Standard drinks or equivalent per week     Comment: 1-2 beers on the weekend    Review of Systems Right wrist pain Allergies  Beta adrenergic blockers and Isosorb dinitrate-hydralazine  Home Medications   Prior to Admission medications   Medication Sig Start Date End Date Taking? Authorizing Provider  acetaminophen (TYLENOL) 500 MG tablet Take 1,000 mg by mouth every 6 (six) hours as needed. For pain    Historical Provider, MD  allopurinol (ZYLOPRIM) 100 MG tablet Take 2 tablets  (200 mg total) by mouth daily. 11/26/14   Deboraha Sprang, MD  carvedilol (COREG) 25 MG tablet Take 1 tablet (25 mg total) by mouth 2 (two) times daily. Needs office visit for further refills 06/23/15   Jolaine Artist, MD  digoxin (LANOXIN) 0.25 MG tablet Take 1 tablet (250 mcg total) by mouth daily. 08/18/15   Jolaine Artist, MD  methocarbamol (ROBAXIN) 500 MG tablet Take 1-2 tablets (500-1,000 mg total) by mouth every 6 (six) hours as needed for muscle spasms. 12/03/14   Waldemar Dickens, MD  naproxen (NAPROSYN) 500 MG tablet Take 1 tablet (500 mg total) by mouth 2 (two) times daily. 09/25/15   Konrad Felix, PA  potassium chloride SA (K-DUR,KLOR-CON) 20 MEQ tablet Take 20 mEq by mouth 2 (two) times daily.    Historical Provider, MD  sacubitril-valsartan (ENTRESTO) 49-51 MG Take 1 tablet by mouth 2 (two) times daily. 05/15/15   Jolaine Artist, MD  spironolactone (ALDACTONE) 25 MG tablet Take 1 tablet (25 mg total) by mouth daily. 07/08/15   Jolaine Artist, MD  torsemide (DEMADEX) 20 MG tablet Take 3 tablets (60 mg total) by mouth daily. 08/20/15   Jolaine Artist, MD   Meds Ordered and Administered this Visit  Medications - No data to display  There were no vitals taken for this visit. No data found.   Physical Exam NURSES NOTES AND VITAL SIGNS REVIEWED. CONSTITUTIONAL: Well developed, well nourished, no acute distress HEENT:  normocephalic, atraumatic EYES: Conjunctiva normal NECK:normal ROM, supple, no adenopathy PULMONARY:No respiratory distress, normal effort MUSCULOSKELETAL: Normal ROM of all extremities, Right wrist: swollen, tender to palpation lateral aspect above thumb on distal radius. Sensory and motor function intact.  SKIN: warm and dry without rash PSYCHIATRIC: Mood and affect, behavior are normal  ED Course  Procedures (including critical care time)  Labs Review Labs Reviewed - No data to display  Imaging Review No results found.   Visual Acuity  Review  Right Eye Distance:   Left Eye Distance:   Bilateral Distance:    Right Eye Near:   Left Eye Near:    Bilateral Near:     RX ANAPROX    MDM   1. Acute gout of right wrist, unspecified cause     Patient is reassured that there are no issues that require transfer to higher level of care at this time or additional tests. Patient is advised to continue home symptomatic treatment. Patient is advised that if there are new or worsening symptoms to attend the emergency department, contact primary care provider, or return to UC. Instructions of care provided discharged home in stable condition.    THIS NOTE WAS GENERATED USING A VOICE RECOGNITION SOFTWARE PROGRAM. ALL REASONABLE EFFORTS  WERE MADE TO PROOFREAD THIS DOCUMENT FOR ACCURACY.  I have verbally reviewed the discharge instructions with the patient. A printed AVS was given to the patient.  All questions were answered prior to discharge.      Konrad Felix, [place] 09/25/15 1545

## 2015-09-29 ENCOUNTER — Telehealth: Payer: Self-pay | Admitting: Internal Medicine

## 2015-09-29 NOTE — Telephone Encounter (Signed)
I called and spoke with the patient. We have no PCP on file for him, but he states he currently has one that he sees although he can't recall the name right now. I have advised him he will need to get his RX for gout filled from his PCP. He is agreeable.

## 2015-09-29 NOTE — Telephone Encounter (Signed)
Pt was taking off Allopurinol and would like to go back on it, has gout in right hand -uses [facility] [facility]

## 2015-11-05 ENCOUNTER — Telehealth (HOSPITAL_COMMUNITY): Payer: Self-pay | Admitting: *Deleted

## 2015-11-05 DIAGNOSIS — I5022 Chronic systolic (congestive) heart failure: Secondary | ICD-10-CM

## 2015-11-05 MED ORDER — CARVEDILOL 25 MG PO TABS: 25.0000 mg | ORAL_TABLET | Freq: Two times a day (BID) | ORAL | Status: DC

## 2015-11-05 NOTE — Telephone Encounter (Signed)
Refill encounter ?

## 2015-11-12 ENCOUNTER — Other Ambulatory Visit (HOSPITAL_COMMUNITY): Payer: Self-pay | Admitting: *Deleted

## 2015-11-12 MED ORDER — POTASSIUM CHLORIDE CRYS ER 20 MEQ PO TBCR: 20.0000 meq | EXTENDED_RELEASE_TABLET | Freq: Two times a day (BID) | ORAL | Status: DC

## 2015-12-02 ENCOUNTER — Encounter (HOSPITAL_COMMUNITY): Payer: Self-pay | Admitting: Emergency Medicine

## 2015-12-02 ENCOUNTER — Ambulatory Visit (HOSPITAL_COMMUNITY)
Admission: EM | Admit: 2015-12-02 | Discharge: 2015-12-02 | Disposition: A | Discharge status: Home / Self Care | Payer: Medicare Other | Attending: Emergency Medicine | Admitting: Emergency Medicine

## 2015-12-02 DIAGNOSIS — M62838 Other muscle spasm: Secondary | ICD-10-CM

## 2015-12-02 DIAGNOSIS — M545 Low back pain, unspecified: Secondary | ICD-10-CM

## 2015-12-02 DIAGNOSIS — M542 Cervicalgia: Secondary | ICD-10-CM

## 2015-12-02 DIAGNOSIS — M6248 Contracture of muscle, other site: Secondary | ICD-10-CM | POA: Diagnosis not present

## 2015-12-02 MED ORDER — METHOCARBAMOL 500 MG PO TABS: 500.0000 mg | ORAL_TABLET | Freq: Two times a day (BID) | ORAL | Status: DC

## 2015-12-02 MED ORDER — MELOXICAM 7.5 MG PO TABS: 7.5000 mg | ORAL_TABLET | Freq: Every day | ORAL | Status: DC

## 2015-12-02 NOTE — ED Notes (Addendum)
The patient presented to the [facility] with a complaint of neck pain that started 2 days ago and progressed into his lower back this morning. The patient stated that he woke up with a stiff neck and denied any known injury.

## 2015-12-02 NOTE — Discharge Instructions (Signed)
Robaxin is a muscle relaxer and may cause drowsiness. Do not drink alcohol, drive, or operate heavy machinery while taking.  Meloxicam (Mobic) is an antiinflammatory to help with pain and inflammation.  Do not take ibuprofen, Advil, Aleve, or any other medications that contain NSAIDs while taking meloxicam as this may cause stomach upset or even ulcers if taken in large amounts for an extended period of time.    Back Exercises If you have pain in your back, do these exercises 2-3 times each day or as told by your doctor. When the pain goes away, do the exercises once each day, but repeat the steps more times for each exercise (do more repetitions). If you do not have pain in your back, do these exercises once each day or as told by your doctor. EXERCISES Single Knee to Chest Do these steps 3-5 times in a row for each leg: 1. Lie on your back on a firm bed or the floor with your legs stretched out. 2. Bring one knee to your chest. 3. Hold your knee to your chest by grabbing your knee or thigh. 4. Pull on your knee until you feel a gentle stretch in your lower back. 5. Keep doing the stretch for 10-30 seconds. 6. Slowly let go of your leg and straighten it. Pelvic Tilt Do these steps 5-10 times in a row: 1. Lie on your back on a firm bed or the floor with your legs stretched out. 2. Bend your knees so they point up to the ceiling. Your feet should be flat on the floor. 3. Tighten your lower belly (abdomen) muscles to press your lower back against the floor. This will make your tailbone point up to the ceiling instead of pointing down to your feet or the floor. 4. Stay in this position for 5-10 seconds while you gently tighten your muscles and breathe evenly. Cat-Cow Do these steps until your lower back bends more easily: 1. Get on your hands and knees on a firm surface. Keep your hands under your shoulders, and keep your knees under your hips. You may put padding under your knees. 2. Let your  head hang down, and make your tailbone point down to the floor so your lower back is round like the back of a cat. 3. Stay in this position for 5 seconds. 4. Slowly lift your head and make your tailbone point up to the ceiling so your back hangs low (sags) like the back of a cow. 5. Stay in this position for 5 seconds. Press-Ups Do these steps 5-10 times in a row: 1. Lie on your belly (face-down) on the floor. 2. Place your hands near your head, about shoulder-width apart. 3. While you keep your back relaxed and keep your hips on the floor, slowly straighten your arms to raise the top half of your body and lift your shoulders. Do not use your back muscles. To make yourself more comfortable, you may change where you place your hands. 4. Stay in this position for 5 seconds. 5. Slowly return to lying flat on the floor. Bridges Do these steps 10 times in a row: 1. Lie on your back on a firm surface. 2. Bend your knees so they point up to the ceiling. Your feet should be flat on the floor. 3. Tighten your butt muscles and lift your butt off of the floor until your waist is almost as high as your knees. If you do not feel the muscles working in your butt and  the back of your thighs, slide your feet 1-2 inches farther away from your butt. 4. Stay in this position for 3-5 seconds. 5. Slowly lower your butt to the floor, and let your butt muscles relax. If this exercise is too easy, try doing it with your arms crossed over your chest. Belly Crunches Do these steps 5-10 times in a row: 1. Lie on your back on a firm bed or the floor with your legs stretched out. 2. Bend your knees so they point up to the ceiling. Your feet should be flat on the floor. 3. Cross your arms over your chest. 4. Tip your chin a little bit toward your chest but do not bend your neck. 5. Tighten your belly muscles and slowly raise your chest just enough to lift your shoulder blades a tiny bit off of the floor. 6. Slowly lower  your chest and your head to the floor. Back Lifts Do these steps 5-10 times in a row: 1. Lie on your belly (face-down) with your arms at your sides, and rest your forehead on the floor. 2. Tighten the muscles in your legs and your butt. 3. Slowly lift your chest off of the floor while you keep your hips on the floor. Keep the back of your head in line with the curve in your back. Look at the floor while you do this. 4. Stay in this position for 3-5 seconds. 5. Slowly lower your chest and your face to the floor. GET HELP IF:  Your back pain gets a lot worse when you do an exercise.  Your back pain does not lessen 2 hours after you exercise. If you have any of these problems, stop doing the exercises. Do not do them again unless your doctor says it is okay. GET HELP RIGHT AWAY IF:  You have sudden, very bad back pain. If this happens, stop doing the exercises. Do not do them again unless your doctor says it is okay.   This information is not intended to replace advice given to you by your health care provider. Make sure you discuss any questions you have with your health care provider.   Document Released: 07/03/2010 Document Revised: 02/19/2015 Document Reviewed: 07/25/2014 Elsevier Interactive Patient Education [employer].

## 2015-12-02 NOTE — ED Provider Notes (Signed)
CSN: [id number]     Arrival date & time 12/02/15  1259 History   First MD Initiated Contact with Patient 12/02/15 1325     Chief Complaint  Patient presents with  . Torticollis  . Back Pain   (Consider location/radiation/quality/duration/timing/severity/associated sxs/prior Treatment) HPI  AMITOJ BOLICH is a 50 y.o. male presenting to UC with c/o Left sided neck pain and stiffness as well as Right lower back pain and stiffness. Notes he woke up with the pain. Denies heavy lifting, falls or known injuries. He has been taking ibuprofen w/o relief. Pain is constant, aching and sharp with certain movements, [id number] at worst. Denies numbness or weakness in arms or legs. No hx of back or neck surgeries.    Past Medical History  Diagnosis Date  . Chronic systolic heart failure ([place])     Diagnosed 2001; prior patient of Dr. Linard Millers with [name] - discharged from practice  . Dilated cardiomyopathy ([facility])     LVEF 10-20%  . Hyperlipidemia   . Essential hypertension, benign   . Morbid obesity ([place])   . Gout   . 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 Arthritis     knee's  . VT (ventricular tachycardia) ([place]) 06/07/2011   Past Surgical History  Procedure Laterality Date  . Icd  10/27/2011  . Implantable cardioverter defibrillator implant N/A 10/27/2011    Procedure: IMPLANTABLE CARDIOVERTER DEFIBRILLATOR IMPLANT;  Surgeon: Deboraha Sprang, MD;  Location: [facility] CATH LAB;  Service: Cardiovascular;  Laterality: N/A;   Family History  Problem Relation Age of Onset  . Hypertension Mother    Social History  Substance Use Topics  . Smoking status: Never Smoker   . Smokeless tobacco: Never Used  . Alcohol Use: 0.0 oz/week    0 Standard drinks or equivalent per week     Comment: 1-2 beers on the weekend    Review of Systems  Constitutional: Negative for fever and chills.  Musculoskeletal: Positive for myalgias, back pain, neck pain and neck stiffness. Negative for joint  swelling, arthralgias and gait problem.  Skin: Negative for rash and wound.  Neurological: Negative for weakness and numbness.    Allergies  Beta adrenergic blockers and Isosorb dinitrate-hydralazine  Home Medications   Prior to Admission medications   Medication Sig Start Date End Date Taking? Authorizing Provider  carvedilol (COREG) 25 MG tablet Take 1 tablet (25 mg total) by mouth 2 (two) times daily. 11/05/15  Yes Jolaine Artist, MD  digoxin (LANOXIN) 0.25 MG tablet Take 1 tablet (250 mcg total) by mouth daily. 08/18/15  Yes Shaune Pascal Bensimhon, MD  potassium chloride SA (K-DUR,KLOR-CON) 20 MEQ tablet Take 1 tablet (20 mEq total) by mouth 2 (two) times daily. 11/12/15  Yes Shaune Pascal Bensimhon, MD  sacubitril-valsartan (ENTRESTO) 49-51 MG Take 1 tablet by mouth 2 (two) times daily. 05/15/15  Yes Jolaine Artist, MD  torsemide (DEMADEX) 20 MG tablet Take 3 tablets (60 mg total) by mouth daily. 08/20/15  Yes Jolaine Artist, MD  acetaminophen (TYLENOL) 500 MG tablet Take 1,000 mg by mouth every 6 (six) hours as needed. For pain    Historical Provider, MD  allopurinol (ZYLOPRIM) 100 MG tablet Take 2 tablets (200 mg total) by mouth daily. 11/26/14   Deboraha Sprang, MD  meloxicam (MOBIC) 7.5 MG tablet Take 1 tablet (7.5 mg total) by mouth daily. For 5 days, then daily as needed for pain. 12/02/15   Noland Fordyce, PA-C  methocarbamol (ROBAXIN) 500  MG tablet Take 1-2 tablets (500-1,000 mg total) by mouth every 6 (six) hours as needed for muscle spasms. 12/03/14   Waldemar Dickens, MD  methocarbamol (ROBAXIN) 500 MG tablet Take 1 tablet (500 mg total) by mouth 2 (two) times daily. 12/02/15   Noland Fordyce, PA-C  naproxen (NAPROSYN) 500 MG tablet Take 1 tablet (500 mg total) by mouth 2 (two) times daily. 09/25/15   Konrad Felix, PA  spironolactone (ALDACTONE) 25 MG tablet Take 1 tablet (25 mg total) by mouth daily. 07/08/15   Jolaine Artist, MD   Meds Ordered and Administered this Visit    Medications - No data to display  BP 125/82 mmHg  Pulse 88  Temp(Src) 98.1 F (36.7 C) (Oral)  Resp 20  SpO2 95% No data found.   Physical Exam  Constitutional: He is oriented to person, place, and time. He appears well-developed and well-nourished.  HENT:  Head: Normocephalic and atraumatic.  Eyes: EOM are normal.  Neck: Normal range of motion. Neck supple. Muscular tenderness present. No spinous process tenderness present.    No midline spinal tenderness. Tenderness to Left side cervical muscles and Left upper trapezius. Pain reproduced with head rotation to Left and Right.  Cardiovascular: Normal rate.   Pulses:      Radial pulses are 2+ on the right side, and 2+ on the left side.       Dorsalis pedis pulses are 2+ on the right side, and 2+ on the left side.  Pulmonary/Chest: Effort normal.  Musculoskeletal: Normal range of motion.  Full ROM upper and lower extremities with 5/5 strength bilaterally.  No midline spinal tenderness. Tenderness to Left upper trapezius and Right lower lumbar muscles.  Neurological: He is alert and oriented to person, place, and time.  Skin: Skin is warm and dry. No rash noted. No erythema.  Psychiatric: He has a normal mood and affect. His behavior is normal.  Nursing note and vitals reviewed.   ED Course  Procedures (including critical care time)  Labs Review Labs Reviewed - No data to display  Imaging Review No results found.    MDM   1. Neck pain on left side   2. Muscle spasms of head or neck   3. Acute right-sided low back pain without sciatica    Left side neck pain and stiffness and Right lower lumbar muscle tenderness.  No red flag symptoms. No indication for imaging at this time.  Rx: robaxin and mobic.  Encouraged f/u with PCP or [facility]. Patient verbalized understanding and agreement with treatment plan.   Noland Fordyce, PA-C 12/02/15 1524

## 2015-12-15 ENCOUNTER — Telehealth (HOSPITAL_COMMUNITY): Payer: Self-pay | Admitting: Cardiology

## 2015-12-15 DIAGNOSIS — I5022 Chronic systolic (congestive) heart failure: Secondary | ICD-10-CM

## 2015-12-15 MED ORDER — DIGOXIN 250 MCG PO TABS: 250.0000 ug | ORAL_TABLET | Freq: Every day | ORAL | Status: DC

## 2015-12-15 NOTE — Telephone Encounter (Signed)
Pt called to request refills °

## 2016-01-16 ENCOUNTER — Encounter (HOSPITAL_COMMUNITY): Payer: Self-pay | Admitting: Emergency Medicine

## 2016-01-16 ENCOUNTER — Ambulatory Visit (HOSPITAL_COMMUNITY)
Admission: EM | Admit: 2016-01-16 | Discharge: 2016-01-16 | Disposition: A | Discharge status: Home / Self Care | Payer: Medicare Other | Attending: Emergency Medicine | Admitting: Emergency Medicine

## 2016-01-16 DIAGNOSIS — R0789 Other chest pain: Secondary | ICD-10-CM

## 2016-01-16 DIAGNOSIS — S29011A Strain of muscle and tendon of front wall of thorax, initial encounter: Secondary | ICD-10-CM

## 2016-01-16 DIAGNOSIS — S2341XA Sprain of ribs, initial encounter: Secondary | ICD-10-CM

## 2016-01-16 MED ORDER — NAPROXEN 375 MG PO TABS: 375.0000 mg | ORAL_TABLET | Freq: Two times a day (BID) | ORAL | 0 refills | Status: DC

## 2016-01-16 MED ORDER — METHOCARBAMOL 500 MG PO TABS: 500.0000 mg | ORAL_TABLET | Freq: Two times a day (BID) | ORAL | 0 refills | Status: DC

## 2016-01-16 NOTE — ED Provider Notes (Signed)
CSN: [id number]     Arrival date & time 01/16/16  1250 History   First MD Initiated Contact with Patient 01/16/16 1335     Chief Complaint  Patient presents with  . Flank Pain   (Consider location/radiation/quality/duration/timing/severity/associated sxs/prior Treatment) 50 year old morbidly obese male complaining of pain to the left lateral chest wall that radiates toward the left back. This began approximately 4 days ago. States the onset was insidious. No known injury. No trauma. Denies associated shortness of breath or chest pain. Pain is located to the left lateral chest beneath the axilla radiating to the posterior axillary line. Pain is worse with movement, standing from a seated position, rotation of the torso, movement of the arms and taking a deep breath. Has taken Robaxin with modest results. He has a prescription for meloxicam stating this often does not work or makes him feel worse.      Past Medical History:  Diagnosis Date  . Arthritis    knee's  . Chronic systolic heart failure ([place])    Diagnosed 2001; prior patient of Dr. Linard [name] - discharged from practice  . Dilated cardiomyopathy (HCC)    LVEF 10-20%  . Essential hypertension, benign   . Gout   . Hyperlipidemia   . 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 Morbid obesity ([place])   . VT (ventricular tachycardia) ([place]) 06/07/2011   Past Surgical History:  Procedure Laterality Date  . ICD  10/27/2011  . IMPLANTABLE CARDIOVERTER DEFIBRILLATOR IMPLANT N/A 10/27/2011   Procedure: IMPLANTABLE CARDIOVERTER DEFIBRILLATOR IMPLANT;  Surgeon: Deboraha Sprang, MD;  Location: [facility] CATH LAB;  Service: Cardiovascular;  Laterality: N/A;   Family History  Problem Relation Age of Onset  . Hypertension Mother    Social History  Substance Use Topics  . Smoking status: Never Smoker  . Smokeless tobacco: Never Used  . Alcohol use 0.0 oz/week     Comment: 1-2 beers on the weekend    Review of Systems   Constitutional: Negative.   Respiratory: Negative.  Negative for cough, chest tightness and shortness of breath.   Cardiovascular: Negative for chest pain, palpitations and leg swelling.  Gastrointestinal: Negative.   Genitourinary: Negative.   Musculoskeletal:       As per HPI  Skin: Negative.   Neurological: Negative for dizziness, weakness, numbness and headaches.  All other systems reviewed and are negative.   Allergies  Beta adrenergic blockers and Isosorb dinitrate-hydralazine  Home Medications   Prior to Admission medications   Medication Sig Start Date End Date Taking? Authorizing Provider  carvedilol (COREG) 25 MG tablet Take 1 tablet (25 mg total) by mouth 2 (two) times daily. 11/05/15  Yes Jolaine Artist, MD  digoxin (LANOXIN) 0.25 MG tablet Take 1 tablet (250 mcg total) by mouth daily. 12/15/15  Yes Jolaine Artist, MD  methocarbamol (ROBAXIN) 500 MG tablet Take 1-2 tablets (500-1,000 mg total) by mouth every 6 (six) hours as needed for muscle spasms. 12/03/14  Yes Waldemar Dickens, MD  potassium chloride SA (K-DUR,KLOR-CON) 20 MEQ tablet Take 1 tablet (20 mEq total) by mouth 2 (two) times daily. 11/12/15  Yes Shaune Pascal Bensimhon, MD  sacubitril-valsartan (ENTRESTO) 49-51 MG Take 1 tablet by mouth 2 (two) times daily. 05/15/15  Yes Jolaine Artist, MD  spironolactone (ALDACTONE) 25 MG tablet Take 1 tablet (25 mg total) by mouth daily. 07/08/15  Yes Jolaine Artist, MD  torsemide (DEMADEX) 20 MG tablet Take 3 tablets (60 mg total) by  mouth daily. 08/20/15  Yes Jolaine Artist, MD  acetaminophen (TYLENOL) 500 MG tablet Take 1,000 mg by mouth every 6 (six) hours as needed. For pain    Historical Provider, MD  allopurinol (ZYLOPRIM) 100 MG tablet Take 2 tablets (200 mg total) by mouth daily. 11/26/14   Deboraha Sprang, MD  methocarbamol (ROBAXIN) 500 MG tablet Take 1 tablet (500 mg total) by mouth 2 (two) times daily. Prn muscle pain in chest 01/16/16   Janne Napoleon, NP   naproxen (NAPROSYN) 375 MG tablet Take 1 tablet (375 mg total) by mouth 2 (two) times daily. Prn rib pain 01/16/16   Janne Napoleon, NP   Meds Ordered and Administered this Visit  Medications - No data to display  BP 119/64 (BP Location: Left Arm)   Pulse 91   Temp 98.1 F (36.7 C) (Oral)   SpO2 97%  No data found.   Physical Exam  Constitutional: He is oriented to person, place, and time. He appears well-nourished. No distress.  HENT:  Head: Normocephalic and atraumatic.  Eyes: EOM are normal. Left eye exhibits no discharge.  Neck: Normal range of motion. Neck supple.  Cardiovascular: Normal rate and regular rhythm.   Pulmonary/Chest: Effort normal and breath sounds normal. No respiratory distress. He has no wheezes. He has no rales.  Tenderness along the sixth and seventh rib of the left lateral chest. Having the patient flex upper torso bilaterally reproduces the pain. Reproduction of the pain is also noticed when the patient stands and sits.  Musculoskeletal: He exhibits no edema or deformity.  Neurological: He is alert and oriented to person, place, and time. No cranial nerve deficit.  Skin: Skin is warm and dry. He is not diaphoretic.  Psychiatric: He has a normal mood and affect.  Nursing note and vitals reviewed.   Urgent Care Course   Clinical Course    Procedures (including critical care time)  Labs Review Labs Reviewed - No data to display  Imaging Review No results found.   Visual Acuity Review  Right Eye Distance:   Left Eye Distance:   Bilateral Distance:    Right Eye Near:   Left Eye Near:    Bilateral Near:         MDM   1. Intercostal muscle strain, initial encounter   2. Left-sided chest wall pain    Ice to the area of soreness in the chest with the next 2-3 days. After that start using warm compresses. Medications as directed. Take only as needed and the minimum amount. Follow-up with your primary care doctor as needed. Stop taking the  meloxicam. Meds ordered this encounter  Medications  . methocarbamol (ROBAXIN) 500 MG tablet    Sig: Take 1 tablet (500 mg total) by mouth 2 (two) times daily. Prn muscle pain in chest    Dispense:  20 tablet    Refill:  0    Order Specific Question:   Supervising Provider    Answer:   Melony Overly [id number]  . naproxen (NAPROSYN) 375 MG tablet    Sig: Take 1 tablet (375 mg total) by mouth 2 (two) times daily. Prn rib pain    Dispense:  15 tablet    Refill:  0    Order Specific Question:   Supervising Provider    Answer:   Melony Overly [id number]       Janne Napoleon, NP 01/16/16 1402    Janne Napoleon, NP 01/16/16 [phone]

## 2016-01-16 NOTE — ED Triage Notes (Signed)
Patient presents with pain on left side that radiates towards middle lower back since Monday, pt has taken a muscle relaxer on yesterday to alleviate pain, no acute distress

## 2016-01-26 ENCOUNTER — Other Ambulatory Visit (HOSPITAL_COMMUNITY): Payer: Self-pay | Admitting: Internal Medicine

## 2016-02-06 ENCOUNTER — Encounter (HOSPITAL_COMMUNITY): Payer: Self-pay | Admitting: Family Medicine

## 2016-02-06 ENCOUNTER — Ambulatory Visit (HOSPITAL_COMMUNITY)
Admission: EM | Admit: 2016-02-06 | Discharge: 2016-02-06 | Disposition: A | Discharge status: Home / Self Care | Payer: Medicare Other | Attending: Physician Assistant | Admitting: Physician Assistant

## 2016-02-06 DIAGNOSIS — M25461 Effusion, right knee: Secondary | ICD-10-CM

## 2016-02-06 MED ORDER — NAPROXEN SODIUM 550 MG PO TABS: 550.0000 mg | ORAL_TABLET | Freq: Two times a day (BID) | ORAL | 0 refills | Status: DC

## 2016-02-06 NOTE — ED Triage Notes (Signed)
Pt here for right knee pain and swelling. Denies injury

## 2016-03-12 ENCOUNTER — Encounter: Payer: Self-pay | Admitting: Internal Medicine

## 2016-03-12 ENCOUNTER — Encounter (INDEPENDENT_AMBULATORY_CARE_PROVIDER_SITE_OTHER): Payer: Self-pay

## 2016-03-12 ENCOUNTER — Ambulatory Visit (INDEPENDENT_AMBULATORY_CARE_PROVIDER_SITE_OTHER): Payer: Medicare Other | Admitting: Internal Medicine

## 2016-03-12 VITALS — BP 112/60 | HR 82 | Ht 70.0 in | Wt 371.0 lb

## 2016-03-12 DIAGNOSIS — I509 Heart failure, unspecified: Secondary | ICD-10-CM

## 2016-03-12 DIAGNOSIS — Z95810 Presence of automatic (implantable) cardiac defibrillator: Secondary | ICD-10-CM

## 2016-03-12 DIAGNOSIS — I428 Other cardiomyopathies: Secondary | ICD-10-CM

## 2016-03-12 DIAGNOSIS — I472 Ventricular tachycardia: Secondary | ICD-10-CM

## 2016-03-12 DIAGNOSIS — I429 Cardiomyopathy, unspecified: Secondary | ICD-10-CM

## 2016-03-12 LAB — BASIC METABOLIC PANEL
BUN: 15 mg/dL (ref 7–25)
CO2: 24 mmol/L (ref 20–31)
Calcium: 9.5 mg/dL (ref 8.6–10.3)
Chloride: 101 mmol/L (ref 98–110)
Creat: 1.11 mg/dL (ref 0.60–1.35)
GLUCOSE: 88 mg/dL (ref 65–99)
POTASSIUM: 4.7 mmol/L (ref 3.5–5.3)
Sodium: 139 mmol/L (ref 135–146)

## 2016-03-12 NOTE — Progress Notes (Signed)
Patient Care Team: Nolene Ebbs, MD as PCP - General (Internal Medicine)   HPI  William Davila is a 50 y.o. 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 He has a history of nonsustained ventricular tachycardia.  Echo 3/13--EF 20-25% with mild MR. ECHO 06/20/14 EF 25-30% RV normal   Seen  last 12/14  Seen in heart failure clinic Clinic although he has not been there in some time.    12/16 Cr 1.21 K  3.8   He has problems with exercise related to his knees and weight. He has daytime somnolence but does not think it is excessive. He's had no edema. He has chronic dyspnea on exertion.  He is bored and is unable to get around because of transportation issues  Past Medical History:  Diagnosis Date  . Arthritis    knee's  . Chronic systolic heart failure ([place])    Diagnosed 2001; prior patient of Dr. Linard Millers with [name] - discharged from practice  . Dilated cardiomyopathy (HCC)    LVEF 10-20%  . Essential hypertension, benign   . Gout   . Hyperlipidemia   . 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 Morbid obesity ([place])   . VT (ventricular tachycardia) ([place]) 06/07/2011    Past Surgical History:  Procedure Laterality Date  . ICD  10/27/2011  . IMPLANTABLE CARDIOVERTER DEFIBRILLATOR IMPLANT N/A 10/27/2011   Procedure: IMPLANTABLE CARDIOVERTER DEFIBRILLATOR IMPLANT;  Surgeon: Deboraha Sprang, MD;  Location: [facility] CATH LAB;  Service: Cardiovascular;  Laterality: N/A;    Current Outpatient Prescriptions  Medication Sig Dispense Refill  . acetaminophen (TYLENOL) 500 MG tablet Take 1,000 mg by mouth every 6 (six) hours as needed. For pain    . carvedilol (COREG) 25 MG tablet Take 1 tablet (25 mg total) by mouth 2 (two) times daily. 60 tablet 3  . colchicine 0.6 MG tablet Take 1 tablet by mouth daily.    . digoxin (LANOXIN) 0.25 MG tablet Take 1 tablet (250 mcg total) by mouth  daily. 30 tablet 3  . ENTRESTO 49-51 MG take 1 tablet by mouth twice a day 60 tablet 6  . methocarbamol (ROBAXIN) 500 MG tablet Take 1-2 tablets (500-1,000 mg total) by mouth every 6 (six) hours as needed for muscle spasms. 60 tablet 0  . naproxen (NAPROSYN) 375 MG tablet Take 1 tablet (375 mg total) by mouth 2 (two) times daily. Prn rib pain 15 tablet 0  . naproxen sodium (ANAPROX DS) 550 MG tablet Take 1 tablet (550 mg total) by mouth 2 (two) times daily with a meal. 30 tablet 0  . potassium chloride SA (K-DUR,KLOR-CON) 20 MEQ tablet Take 1 tablet (20 mEq total) by mouth 2 (two) times daily. 60 tablet 3  . spironolactone (ALDACTONE) 25 MG tablet Take 1 tablet (25 mg total) by mouth daily. 90 tablet 3  . torsemide (DEMADEX) 20 MG tablet Take 3 tablets (60 mg total) by mouth daily. 90 tablet 6   No current facility-administered medications for this visit.     Allergies  Allergen Reactions  . Beta Adrenergic Blockers Other (See Comments)    REACTION: fatigue and increased dyspnea with beta blockers.  Marland Kitchen Isosorb Dinitrate-Hydralazine Other (See Comments)    REACTION: headache    Review of Systems negative except from HPI and PMH  Physical Exam BP 112/60   Pulse 82   Ht 5\' 10"  (  1.778 m)   Wt (!) 371 lb (168.3 kg)   SpO2 96%   BMI 53.23 kg/m  Well developed and well nourished in no acute distress HENT normal E scleral and icterus clear Neck Supple JVP flat; carotids brisk and full Clear to ausculation  Regular rate and rhythm, no murmurs gallops or rub Soft with active bowel sounds No clubbing cyanosis no  Edema Alert and oriented, grossly normal motor and sensory function Skin Warm and Dry  ECG demonstrated sinus rate at 83 25/14/39 Axis I -46  Assessment and  Plan  NICM   ICD The patient's device was interrogated.  The information was reviewed. No changes were made in the programming.     CHF   High Risk Medication Surveillance  Morbidly obese      HTN  Euvolemic continue current meds  survieillance labs today  Again encouraged weight loss  On Guideline directed medical therapy

## 2016-03-12 NOTE — Patient Instructions (Addendum)
Medication Instructions: - Your physician recommends that you continue on your current medications as directed. Please refer to the Current Medication list given to you today.  Labwork: - Your physician recommends that you have lab work today: BMP/Digoxin  Procedures/Testing: - none ordered  Follow-Up: - Your physician recommends that you schedule a follow-up appointment: next available with the [facility] (established patient)   - Remote monitoring is used to monitor your Pacemaker of ICD from home. This monitoring reduces the number of office visits required to check your device to one time per year. It allows [place] to keep an eye on the functioning of your device to ensure it is working properly. You are scheduled for a device check from home on 06/15/16. You may send your transmission at any time that day. If you have a wireless device, the transmission will be sent automatically. After your physician reviews your transmission, you will receive a postcard with your next transmission date.  - Your physician wants you to follow-up in: 1 year with [name],  You will receive a reminder letter in the mail two months in advance. If you don't receive a letter, please call our office to schedule the follow-up appointment.   Any Additional Special Instructions Will Be Listed Below (If Applicable).     If you need a refill on your cardiac medications before your next appointment, please call your pharmacy.

## 2016-03-13 LAB — DIGOXIN LEVEL: Digoxin Level: 1.4 ug/L (ref 0.8–2.0)

## 2016-03-16 LAB — CUP PACEART INCLINIC DEVICE CHECK
Battery Voltage: 3.07 V
Brady Statistic AP VP Percent: 0 %
Brady Statistic AS VP Percent: 0.01 %
Brady Statistic RA Percent Paced: 0 %
Brady Statistic RV Percent Paced: 0.01 %
HIGH POWER IMPEDANCE MEASURED VALUE: 69 Ohm
HighPow Impedance: 304 Ohm
HighPow Impedance: 55 Ohm
Implantable Lead Implant Date: 20130515
Implantable Lead Location: 753860
Lead Channel Pacing Threshold Amplitude: 1 V
Lead Channel Pacing Threshold Pulse Width: 0.4 ms
Lead Channel Sensing Intrinsic Amplitude: 0.5 mV
Lead Channel Sensing Intrinsic Amplitude: 9.5 mV
Lead Channel Setting Pacing Amplitude: 2.5 V
Lead Channel Setting Pacing Pulse Width: 0.4 ms
MDC IDC LEAD MODEL: 7121
MDC IDC MSMT LEADCHNL LV IMPEDANCE VALUE: 4047 Ohm
MDC IDC MSMT LEADCHNL LV IMPEDANCE VALUE: 4047 Ohm
MDC IDC MSMT LEADCHNL LV IMPEDANCE VALUE: 4047 Ohm
MDC IDC MSMT LEADCHNL RA IMPEDANCE VALUE: 4047 Ohm
MDC IDC MSMT LEADCHNL RA SENSING INTR AMPL: 0.5 mV
MDC IDC MSMT LEADCHNL RV IMPEDANCE VALUE: 399 Ohm
MDC IDC MSMT LEADCHNL RV SENSING INTR AMPL: 11.625 mV
MDC IDC SESS DTM: 20170929183241
MDC IDC SET LEADCHNL RV SENSING SENSITIVITY: 0.3 mV
MDC IDC STAT BRADY AP VS PERCENT: 0 %
MDC IDC STAT BRADY AS VS PERCENT: 99.99 %

## 2016-03-18 ENCOUNTER — Telehealth: Payer: Self-pay | Admitting: Internal Medicine

## 2016-03-18 ENCOUNTER — Other Ambulatory Visit (HOSPITAL_COMMUNITY): Payer: Self-pay | Admitting: Internal Medicine

## 2016-03-18 MED ORDER — TORSEMIDE 20 MG PO TABS: ORAL_TABLET | ORAL | 6 refills | Status: DC

## 2016-03-18 MED ORDER — DIGOXIN 125 MCG PO TABS: 0.1250 mg | ORAL_TABLET | Freq: Every day | ORAL | 6 refills | Status: DC

## 2016-03-18 NOTE — Telephone Encounter (Signed)
I called and spoke with the patient regarding his most recent lab results (BMP/ Digoxin) reviewed by Dr. Caryl Comes. He is aware that his digoxin level is a little bit elevated and that he needs to decrease digoxin to 0.125 mg daily per Dr. Caryl Comes. He verbalizes understanding.  New RX to be sent to the pharmacy.  He is also requesting a torsemide refill. Dose verified with the patient. Will send RX refill.

## 2016-03-24 ENCOUNTER — Other Ambulatory Visit (HOSPITAL_COMMUNITY): Payer: Self-pay | Admitting: Internal Medicine

## 2016-03-24 ENCOUNTER — Other Ambulatory Visit: Payer: Self-pay | Admitting: *Deleted

## 2016-03-24 DIAGNOSIS — I5022 Chronic systolic (congestive) heart failure: Secondary | ICD-10-CM

## 2016-03-26 ENCOUNTER — Other Ambulatory Visit (HOSPITAL_COMMUNITY): Payer: Self-pay | Admitting: *Deleted

## 2016-03-26 DIAGNOSIS — I5022 Chronic systolic (congestive) heart failure: Secondary | ICD-10-CM

## 2016-03-26 MED ORDER — CARVEDILOL 25 MG PO TABS: 25.0000 mg | ORAL_TABLET | Freq: Two times a day (BID) | ORAL | 3 refills | Status: DC

## 2016-04-05 ENCOUNTER — Other Ambulatory Visit (HOSPITAL_COMMUNITY): Payer: Self-pay | Admitting: Internal Medicine

## 2016-05-03 ENCOUNTER — Telehealth (HOSPITAL_COMMUNITY): Payer: Self-pay | Admitting: Vascular Surgery

## 2016-05-03 NOTE — Telephone Encounter (Signed)
Left pt message to make f/u appt 

## 2016-05-12 ENCOUNTER — Ambulatory Visit (HOSPITAL_COMMUNITY)
Admission: RE | Admit: 2016-05-12 | Discharge: 2016-05-12 | Disposition: A | Discharge status: Home / Self Care | Payer: Medicare Other | Source: Ambulatory Visit | Attending: Cardiology | Admitting: Cardiology

## 2016-05-12 VITALS — BP 122/68 | HR 98 | Wt 377.5 lb

## 2016-05-12 DIAGNOSIS — Z79899 Other long term (current) drug therapy: Secondary | ICD-10-CM | POA: Insufficient documentation

## 2016-05-12 DIAGNOSIS — E669 Obesity, unspecified: Secondary | ICD-10-CM | POA: Insufficient documentation

## 2016-05-12 DIAGNOSIS — I110 Hypertensive heart disease with heart failure: Secondary | ICD-10-CM | POA: Insufficient documentation

## 2016-05-12 DIAGNOSIS — I5042 Chronic combined systolic (congestive) and diastolic (congestive) heart failure: Secondary | ICD-10-CM | POA: Diagnosis not present

## 2016-05-12 DIAGNOSIS — E785 Hyperlipidemia, unspecified: Secondary | ICD-10-CM | POA: Diagnosis present

## 2016-05-12 DIAGNOSIS — Z6841 Body Mass Index (BMI) 40.0 and over, adult: Secondary | ICD-10-CM | POA: Diagnosis not present

## 2016-05-12 DIAGNOSIS — I5022 Chronic systolic (congestive) heart failure: Secondary | ICD-10-CM | POA: Insufficient documentation

## 2016-05-12 LAB — BASIC METABOLIC PANEL
Anion gap: 11 (ref 5–15)
BUN: 17 mg/dL (ref 6–20)
CALCIUM: 9.4 mg/dL (ref 8.9–10.3)
CHLORIDE: 99 mmol/L — AB (ref 101–111)
CO2: 27 mmol/L (ref 22–32)
CREATININE: 1.29 mg/dL — AB (ref 0.61–1.24)
GFR calc Af Amer: >60 mL/min (ref >60)
GFR calc non Af Amer: >60 mL/min (ref >60)
GLUCOSE: 137 mg/dL — AB (ref 65–99)
Potassium: 4.5 mmol/L (ref 3.5–5.1)
Sodium: 137 mmol/L (ref 135–145)

## 2016-05-12 LAB — DIGOXIN LEVEL: Digoxin Level: 0.3 ng/mL — ABNORMAL LOW (ref 0.8–2.0)

## 2016-05-12 LAB — BRAIN NATRIURETIC PEPTIDE: B Natriuretic Peptide: 110.9 pg/mL — ABNORMAL HIGH (ref 0.0–100.0)

## 2016-05-12 NOTE — Patient Instructions (Signed)
Labs today  Your physician recommends that you schedule a follow-up appointment in: 2 months with echocardiogram  

## 2016-05-12 NOTE — Progress Notes (Signed)
Patient ID: William Davila, male   DOB: Nov 11, 1965, 50 y.o.   MRN: [id number]     Ortho: Dr Nori Riis  PCP: Dr Mechele Claude.  EP: Dr Caryl Comes  HF MD: Dr Haroldine Laws  HPI: LAJUANE LENDER is a 50 y.o. male with systolic heart failure , cardiolmyopathy diagnosed 2001, hyperlipidemia, HTN, and obesity. S/P [employer] Jude single chamber ICD 10/2011. Intolerant Bidil due to headaches.    Discharged from [facility] 05/10/2011 Discharge weight 328.  Massive volume over load and low output. Cardiac output increased inotropes. Medications initiated ace, beta blocker , digoxin, and torsemide.   He returns today for follow up. Last seen in HF clinic 05/2015. Seen by Dr. Caryl Comes 03/12/16 stable. Down 8 lbs from his visit nearly a year ago. Feels good overall.  Denies SOB. No DOE. Steps mostly limited to knee pain. No ICD shocks. Weight at home ~ 370s. Lives with grandfather. Taking all medication as directed. Hasn't needed any extra torsemide.   ECHO 08/2011 EF 20-25% ECHO 06/20/14 EF 25-30% RV normal   Labs 06/04/14 K 3.6 Creatinine 0.96.  Labs 04/30/2015: 3.0  Labs 05/07/2015 K 3.4 Creatinine  ROS: All systems negative except as listed in HPI, PMH and Problem List.  Past Medical History:  Diagnosis Date  . Arthritis    knee's  . Chronic systolic heart failure ([place])    Diagnosed 2001; prior patient of Dr. Linard Mill[facility] with [name] - discharged from practice  . Dilated cardiomyopathy (HCC)    LVEF 10-20%  . Essential hypertension, benign   . Gout   . Hyperlipidemia   . 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 Morbid obesity ([place])   . VT (ventricular tachycardia) ([place]) 06/07/2011    Current Outpatient Prescriptions  Medication Sig Dispense Refill  . acetaminophen (TYLENOL) 500 MG tablet Take 1,000 mg by mouth every 6 (six) hours as needed. For pain    . carvedilol (COREG) 25 MG tablet Take 1 tablet (25 mg total) by mouth 2 (two) times daily. 60 tablet 3  . colchicine 0.6 MG tablet Take 1 tablet by mouth  daily.    . digoxin (LANOXIN) 0.125 MG tablet Take 1 tablet (0.125 mg total) by mouth daily. 30 tablet 6  . ENTRESTO 49-51 MG take 1 tablet by mouth twice a day 60 tablet 6  . methocarbamol (ROBAXIN) 500 MG tablet Take 1-2 tablets (500-1,000 mg total) by mouth every 6 (six) hours as needed for muscle spasms. 60 tablet 0  . naproxen sodium (ANAPROX DS) 550 MG tablet Take 1 tablet (550 mg total) by mouth 2 (two) times daily with a meal. 30 tablet 0  . potassium chloride SA (K-DUR,KLOR-CON) 20 MEQ tablet take 1 tablet by mouth twice a day 60 tablet 3  . spironolactone (ALDACTONE) 25 MG tablet Take 1 tablet (25 mg total) by mouth daily. 90 tablet 3  . torsemide (DEMADEX) 20 MG tablet Take 3 tablets (60 mg) by mouth once daily 90 tablet 6   No current facility-administered medications for this encounter.      PHYSICAL EXAM: Vitals:   05/12/16 1410  BP: 122/68  Pulse: 98  SpO2: 98%  Weight: (!) 377 lb 8 oz (171.2 kg)   Wt Readings from Last 3 Encounters:  05/12/16 (!) 377 lb 8 oz (171.2 kg)  03/12/16 (!) 371 lb (168.3 kg)  05/15/15 (!) 385 lb 8 oz (174.9 kg)    General:   WDWN. NAD.  HEENT: Normal. Neck: supple. JVP 7-8 cm. Carotids  2+ bilaterally; no bruits. No thyromegaly or nodule noted. Cor: PMI normal. RRR. No rubs, S3  or murmurs. Lungs: CTAB, normal effort. Abdomen: obese, soft, NT, ND, no HSM. No bruits or masses. +BS  Extremities: no cyanosis, clubbing, rash, edema Neuro: alert & orientedx3, cranial nerves grossly intact. Moves all 4 extremities w/o difficulty. Affect pleasant.   ASSESSMENT & PLAN: 1. Chronic Systolic heart Failure- Med[employer]onic Single lead ICD5/2013  January 2016 ECHO EF 25-30%  -NYHA II. Volume status stable on exam.  - Continue torsemide 60 mg daily - Continue carvedilol 25 mg BID - Continue digoxin 0.125 mg daily (Dig level 1.4 03/12/16 and digoxin was decreased. - Continue entresto 49-51 mg BID for now.  Check BMET today. Recent K borderline high so  want to recheck prior to moving up on Entresto.   -Continue 25 mg spironolactone daily. - May be able to back down on K supp based on labs today.  - Reinforced fluid restriction to < 2 L daily, sodium restriction to less than 2000 mg daily, and the importance of daily weights.   2. Obesity-   - Encouraged to cut back on portions and increase activity as able.   Labs today. May be able to increase entresto pending K level.  Follow up 2 months With Echo.  Offered Echo sooner but patient preferred to do all at same time.   Satira Mccallum Erhardt Dada PA-C  2:21 PM

## 2016-05-31 ENCOUNTER — Ambulatory Visit (HOSPITAL_COMMUNITY)
Admission: EM | Admit: 2016-05-31 | Discharge: 2016-05-31 | Disposition: A | Discharge status: Home / Self Care | Payer: Medicare Other | Attending: Family Medicine | Admitting: Family Medicine

## 2016-05-31 ENCOUNTER — Encounter (HOSPITAL_COMMUNITY): Payer: Self-pay

## 2016-05-31 DIAGNOSIS — M25461 Effusion, right knee: Secondary | ICD-10-CM

## 2016-05-31 DIAGNOSIS — M25561 Pain in right knee: Secondary | ICD-10-CM | POA: Diagnosis not present

## 2016-05-31 DIAGNOSIS — M25562 Pain in left knee: Secondary | ICD-10-CM | POA: Diagnosis not present

## 2016-05-31 DIAGNOSIS — M25462 Effusion, left knee: Secondary | ICD-10-CM

## 2016-05-31 DIAGNOSIS — G8929 Other chronic pain: Secondary | ICD-10-CM | POA: Diagnosis not present

## 2016-05-31 MED ORDER — TRAMADOL HCL 50 MG PO TABS: 50.0000 mg | ORAL_TABLET | Freq: Four times a day (QID) | ORAL | 0 refills | Status: DC | PRN

## 2016-05-31 MED ORDER — PREDNISONE 20 MG PO TABS: ORAL_TABLET | ORAL | 0 refills | Status: DC

## 2016-05-31 NOTE — ED Provider Notes (Signed)
CSN: [id number]     Arrival date & time 05/31/16  1300 History   First MD Initiated Contact with Patient 05/31/16 1357     Chief Complaint  Patient presents with  . Knee Pain   (Consider location/radiation/quality/duration/timing/severity/associated sxs/prior Treatment) HPI  William Davila is a 50 y.o. male presenting to UC with c/o gradually worsening bilateral knee pain that started 2 days ago.  Hx of arthritis in his knees and chronic knee pain that comes and goes.  He notes his PCP has told him he may need a knee replacement at some point but he has not been seen by an orthopedist yet.  Pain is aching and sore, worse with palpation, movement, and weight bearing.  Pain is 10/10 at worst, improves with sitting or lying down.  He has take Aleve with mild relief. He notes he has Mobic at home but does not find much relief with that.  Hx of gout but this does not feel like gout. Denies known injury.   Past Medical History:  Diagnosis Date  . Arthritis    knee's  . Chronic systolic heart failure ([place])    Diagnosed 2001; prior patient of Dr. Lin[name] - discharged from practice  . Dilated cardiomyopathy (HCC)    LVEF 10-20%  . Essential hypertension, benign   . Gout   . Hyperlipidemia   . 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 Morbid obesity ([place])   . VT (ventricular tachycardia) ([place]) 06/07/2011   Past Surgical History:  Procedure Laterality Date  . ICD  10/27/2011  . IMPLANTABLE CARDIOVERTER DEFIBRILLATOR IMPLANT N/A 10/27/2011   Procedure: IMPLANTABLE CARDIOVERTER DEFIBRILLATOR IMPLANT;  Surgeon: Deboraha Sprang, MD;  Location: [facility] CATH LAB;  Service: Cardiovascular;  Laterality: N/A;   Family History  Problem Relation Age of Onset  . Hypertension Mother    Social History  Substance Use Topics  . Smoking status: Never Smoker  . Smokeless tobacco: Never Used  . Alcohol use 0.0 oz/week     Comment: 1-2 beers on the weekend    Review of Systems   Musculoskeletal: Positive for arthralgias, joint swelling and myalgias.       Bilateral knees  Skin: Negative for color change, rash and wound.  Neurological: Negative for weakness and light-headedness.    Allergies  Beta adrenergic blockers and Isosorb dinitrate-hydralazine  Home Medications   Prior to Admission medications   Medication Sig Start Date End Date Taking? Authorizing Provider  carvedilol (COREG) 25 MG tablet Take 1 tablet (25 mg total) by mouth 2 (two) times daily. 03/26/16  Yes Jolaine Artist, MD  digoxin (LANOXIN) 0.125 MG tablet Take 1 tablet (0.125 mg total) by mouth daily. 03/18/16  Yes Deboraha Sprang, MD  ENTRESTO 49-51 MG take 1 tablet by mouth twice a day 01/26/16  Yes Jolaine Artist, MD  potassium chloride SA (K-DUR,KLOR-CON) 20 MEQ tablet take 1 tablet by mouth twice a day 04/06/16  Yes Jolaine Artist, MD  spironolactone (ALDACTONE) 25 MG tablet Take 1 tablet (25 mg total) by mouth daily. 07/08/15  Yes Jolaine Artist, MD  torsemide (DEMADEX) 20 MG tablet Take 3 tablets (60 mg) by mouth once daily 03/18/16  Yes Deboraha Sprang, MD  acetaminophen (TYLENOL) 500 MG tablet Take 1,000 mg by mouth every 6 (six) hours as needed. For pain    Historical Provider, MD  colchicine 0.6 MG tablet Take 1 tablet by mouth daily. 02/10/16   Historical Provider,  MD  methocarbamol (ROBAXIN) 500 MG tablet Take 1-2 tablets (500-1,000 mg total) by mouth every 6 (six) hours as needed for muscle spasms. 12/03/14   Waldemar Dickens, MD  naproxen sodium (ANAPROX DS) 550 MG tablet Take 1 tablet (550 mg total) by mouth 2 (two) times daily with a meal. 02/06/16   Konrad Felix, PA  predniSONE (DELTASONE) 20 MG tablet 3 tabs po day one, then 2 po daily x 4 days 05/31/16   Noland Fordyce, PA-C  traMADol (ULTRAM) 50 MG tablet Take 1 tablet (50 mg total) by mouth every 6 (six) hours as needed for moderate pain or severe pain. 05/31/16   Noland Fordyce, PA-C   Meds Ordered and Administered  this Visit  Medications - No data to display  BP 97/57 (BP Location: Right Arm)   Pulse 97   Temp 98.6 F (37 C) (Oral)   Resp 17   SpO2 97%  No data found.   Physical Exam  Constitutional: He is oriented to person, place, and time. He appears well-developed and well-nourished. No distress.  Obese male sitting in exam chair, crutches by his side, NAD.  HENT:  Head: Normocephalic and atraumatic.  Eyes: EOM are normal.  Neck: Normal range of motion.  Cardiovascular: Normal rate.   Pulmonary/Chest: Effort normal.  Musculoskeletal: Normal range of motion. He exhibits edema and tenderness.  Mild edema to both knees. Full ROM. Tenderness to medial aspects of knees.  Calves are soft, non-tender.  Neurological: He is alert and oriented to person, place, and time.  Skin: Skin is warm and dry. No rash noted. He is not diaphoretic. No erythema.  Bilateral knees: skin in tact. No ecchymosis, erythema or warmth.  Psychiatric: He has a normal mood and affect. His behavior is normal.  Nursing note and vitals reviewed.   Urgent Care Course   Clinical Course     Procedures (including critical care time)  Labs Review Labs Reviewed - No data to display  Imaging Review No results found.    MDM   1. Chronic pain of both knees   2. Bilateral knee swelling    Pt c/o bilateral knee pain w/o known injury. Hx of arthritis in knees. Mild edema of both knees noted on exam w/o evidence of gout or septic joint. No imaging indicated at this time.  Knee sleeves applied for comfort. Rx: tramadol for at night, Prednisone May continue to take Advil or Aleve at home for pain. Resource for Orthopedist given to pt, encouraged to call Dr. Debroah Loop orthopedic office for f/u. F/u with PCP as needed.   Noland Fordyce, PA-C 05/31/16 1504

## 2016-05-31 NOTE — Discharge Instructions (Signed)
°  Tramadol is strong pain medication. While taking, do not drink alcohol, drive, or perform any other activities that requires focus while taking these medications.  ° °

## 2016-05-31 NOTE — ED Triage Notes (Signed)
Patient presents to [facility] with complaints of bilateral knee pain x2 days, patient states painful to stand and walk denies any injury

## 2016-06-15 ENCOUNTER — Ambulatory Visit (INDEPENDENT_AMBULATORY_CARE_PROVIDER_SITE_OTHER): Payer: Medicare Other | Admitting: *Deleted

## 2016-06-15 DIAGNOSIS — I472 Ventricular tachycardia, unspecified: Secondary | ICD-10-CM

## 2016-06-18 ENCOUNTER — Encounter: Payer: Self-pay | Admitting: Cardiology

## 2016-06-18 LAB — CUP PACEART REMOTE DEVICE CHECK
Battery Voltage: 3.04 V
Brady Statistic AP VP Percent: 0 %
Brady Statistic AS VP Percent: 0 %
Brady Statistic RA Percent Paced: 0 %
Brady Statistic RV Percent Paced: 0.01 %
HIGH POWER IMPEDANCE MEASURED VALUE: 70 Ohm
HighPow Impedance: 304 Ohm
HighPow Impedance: 53 Ohm
Implantable Lead Implant Date: 20130515
Implantable Pulse Generator Implant Date: 20130515
Lead Channel Pacing Threshold Amplitude: 1.625 V
Lead Channel Pacing Threshold Pulse Width: 0.4 ms
Lead Channel Sensing Intrinsic Amplitude: 0.5 mV
Lead Channel Sensing Intrinsic Amplitude: 14.5 mV
Lead Channel Sensing Intrinsic Amplitude: 14.5 mV
Lead Channel Setting Pacing Amplitude: 3.75 V
Lead Channel Setting Pacing Pulse Width: 0.4 ms
Lead Channel Setting Sensing Sensitivity: 0.3 mV
MDC IDC LEAD LOCATION: 753860
MDC IDC LEAD MODEL: 7121
MDC IDC MSMT LEADCHNL LV IMPEDANCE VALUE: 4047 Ohm
MDC IDC MSMT LEADCHNL LV IMPEDANCE VALUE: 4047 Ohm
MDC IDC MSMT LEADCHNL LV IMPEDANCE VALUE: 4047 Ohm
MDC IDC MSMT LEADCHNL RA IMPEDANCE VALUE: 4047 Ohm
MDC IDC MSMT LEADCHNL RA SENSING INTR AMPL: 0.5 mV
MDC IDC MSMT LEADCHNL RV IMPEDANCE VALUE: 456 Ohm
MDC IDC SESS DTM: 20180102093728
MDC IDC STAT BRADY AP VS PERCENT: 0 %
MDC IDC STAT BRADY AS VS PERCENT: 100 %

## 2016-07-05 ENCOUNTER — Other Ambulatory Visit (HOSPITAL_COMMUNITY): Payer: Self-pay | Admitting: Internal Medicine

## 2016-07-12 ENCOUNTER — Encounter (HOSPITAL_COMMUNITY): Payer: Self-pay | Admitting: Internal Medicine

## 2016-07-12 ENCOUNTER — Ambulatory Visit (HOSPITAL_COMMUNITY)
Admission: RE | Admit: 2016-07-12 | Discharge: 2016-07-12 | Disposition: A | Discharge status: Home / Self Care | Payer: Medicare Other | Source: Ambulatory Visit | Attending: Internal Medicine | Admitting: Internal Medicine

## 2016-07-12 ENCOUNTER — Ambulatory Visit (HOSPITAL_BASED_OUTPATIENT_CLINIC_OR_DEPARTMENT_OTHER)
Admission: RE | Admit: 2016-07-12 | Discharge: 2016-07-12 | Disposition: A | Discharge status: Home / Self Care | Payer: Medicare Other | Source: Ambulatory Visit | Attending: Internal Medicine | Admitting: Internal Medicine

## 2016-07-12 VITALS — BP 118/84 | HR 85 | Wt 369.5 lb

## 2016-07-12 DIAGNOSIS — Z95811 Presence of heart assist device: Secondary | ICD-10-CM | POA: Insufficient documentation

## 2016-07-12 DIAGNOSIS — I5022 Chronic systolic (congestive) heart failure: Secondary | ICD-10-CM | POA: Diagnosis present

## 2016-07-12 DIAGNOSIS — Z79899 Other long term (current) drug therapy: Secondary | ICD-10-CM | POA: Diagnosis not present

## 2016-07-12 DIAGNOSIS — M1990 Unspecified osteoarthritis, unspecified site: Secondary | ICD-10-CM

## 2016-07-12 DIAGNOSIS — Z6841 Body Mass Index (BMI) 40.0 and over, adult: Secondary | ICD-10-CM | POA: Insufficient documentation

## 2016-07-12 DIAGNOSIS — I5042 Chronic combined systolic (congestive) and diastolic (congestive) heart failure: Secondary | ICD-10-CM

## 2016-07-12 DIAGNOSIS — E785 Hyperlipidemia, unspecified: Secondary | ICD-10-CM | POA: Diagnosis present

## 2016-07-12 DIAGNOSIS — I110 Hypertensive heart disease with heart failure: Secondary | ICD-10-CM | POA: Diagnosis present

## 2016-07-12 LAB — ECHOCARDIOGRAM COMPLETE
AO mean calculated velocity dopler: 152 cm/s
AOASC: 33 cm
AV Area VTI: 1.2 cm2
AV Mean grad: 10 mmHg
AV Peak grad: 18 mmHg
AV area mean vel ind: 0.4 cm2/m2
AV pk vel: 212 cm/s
AVAREAMEANV: 1.19 cm2
AVAREAVTIIND: 0.41 cm2/m2
AVCELMEANRAT: 0.42
AVLVOTPG: 3 mmHg
Ao pk vel: 0.42 m/s
CHL CUP AV PEAK INDEX: 0.4
CHL CUP AV VEL: 1.21
CHL CUP DOP CALC LVOT VTI: 18.7 cm
FS: 13 % — AB (ref 28–44)
IVS/LV PW RATIO, ED: 0.67
LA ID, A-P, ES: 44 mm
LA diam end sys: 44 mm
LA diam index: 1.48 cm/m2
LA vol A4C: 72.3 ml
LA vol index: 24.4 mL/m2
LA vol: 72.9 mL
LDCA: 2.84 cm2
LV TDI E'LATERAL: 12.8
LV TDI E'MEDIAL: 3.26
LV e' LATERAL: 12.8 cm/s
LVDIAVOL: 305 mL — AB (ref 62–150)
LVDIAVOLIN: 102 mL/m2
LVOT SV: 53 mL
LVOTD: 19 mm
LVOTPV: 89.9 cm/s
LVOTVTI: 0.43 cm
LVSYSVOL: 247 mL — AB (ref 21–61)
LVSYSVOLIN: 83 mL/m2
PV Reg grad dias: 5 mmHg
PV Reg vel dias: 113 cm/s
PW: 12.5 mm — AB (ref 0.6–1.1)
RV LATERAL S' VELOCITY: 10.1 cm/s
RV TAPSE: 22 mm
RV sys press: 27 mmHg
Reg peak vel: 247 cm/s
Simpson's disk: 19
Stroke v: 58 ml
TRMAXVEL: 247 cm/s
VTI: 43.8 cm
Valve area index: 0.41
Valve area: 1.21 cm2

## 2016-07-12 LAB — BASIC METABOLIC PANEL
ANION GAP: 7 (ref 5–15)
BUN: 13 mg/dL (ref 6–20)
CO2: 29 mmol/L (ref 22–32)
Calcium: 9.7 mg/dL (ref 8.9–10.3)
Chloride: 104 mmol/L (ref 101–111)
Creatinine, Ser: 1.06 mg/dL (ref 0.61–1.24)
GFR calc Af Amer: >60 mL/min (ref >60)
GLUCOSE: 117 mg/dL — AB (ref 65–99)
POTASSIUM: 4 mmol/L (ref 3.5–5.1)
Sodium: 140 mmol/L (ref 135–145)

## 2016-07-12 LAB — DIGOXIN LEVEL: Digoxin Level: 0.5 ng/mL — ABNORMAL LOW (ref 0.8–2.0)

## 2016-07-12 MED ORDER — SACUBITRIL-VALSARTAN 97-103 MG PO TABS: 1.0000 | ORAL_TABLET | Freq: Two times a day (BID) | ORAL | 3 refills | Status: DC

## 2016-07-12 NOTE — Patient Instructions (Signed)
Increase Entresto to 97/103 mg (1 Tablet) Two times Daily  Labs today (will call for abnormal results, otherwise no news is good news)  Labs in 10-14 days  Follow up in 3  Months

## 2016-07-12 NOTE — Addendum Note (Signed)
Encounter addended by: Jolaine Artist, MD on: 07/12/2016  5:41 PM<BR>    Actions taken: Sign clinical note

## 2016-07-12 NOTE — Progress Notes (Signed)
  Echocardiogram 2D Echocardiogram has been performed.  [name] 07/12/2016, 2:02 PM

## 2016-07-12 NOTE — Progress Notes (Addendum)
Patient ID: William Davila, male   DOB: [birth year], 51 y.o.   MRN: [id number]     Ortho: Dr Nori Riis  PCP: [facility] EP: Dr Caryl Comes  HF MD: Dr Haroldine Laws  HPI: William Davila is a 51 y.o. male with systolic heart failure , cardiolmyopathy diagnosed 2001, hyperlipidemia, HTN, and obesity. S/P St Jude single chamber ICD 10/2011. Intolerant Bidil due to headaches.    Discharged from [facility] 05/10/2011 Discharge weight 328.  Massive volume over load and low output. Cardiac output increased inotropes. Medications initiated ace, beta blocker , digoxin, and torsemide.   He presents today for regular follow up. Has been feeling good overall.  Down 8 lbs from last visit. Doesn't weight daily.  Breathing has been "pretty good". No DOE that he can think of, but not very active.  Lives with grandfather.  Taking all medication as directed. Hasn't need any extra torsemide. No ICD shocks. Says he hasn't been very active with "hurting his hand"  Isn't sure what he did. Just sore and achey.   Optivol: Fluid index below threshold, Pt activity ~ 1.5 hrs daily, No VT/VF/AT/AF.   Echo 08/2011 EF 20-25% Echo 06/20/14 EF 25-30% RV normal  Echo 07/12/16 EF 25-30%  Labs 06/04/14 K 3.6 Creatinine 0.96.  Labs 04/30/2015: 3.0  Labs 05/07/2015 K 3.4 Creatinine  ROS: All systems negative except as listed in HPI, PMH and Problem List.  Past Medical History:  Diagnosis Date  . Arthritis    knee's  . Chronic systolic heart failure ([place])    Diagnosed 2001; prior patient of Dr. Linard Millers [name] - discharged from practice  . Dilated cardiomyopathy (HCC)    LVEF 10-20%  . Essential hypertension, benign   . Gout   . Hyperlipidemia   . 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 Morbid obesity ([place])   . VT (ventricular tachycardia) ([place]) 06/07/2011    Current Outpatient Prescriptions  Medication Sig Dispense Refill  . acetaminophen (TYLENOL) 500 MG tablet Take 1,000 mg by mouth every 6 (six) hours  as needed. For pain    . carvedilol (COREG) 25 MG tablet Take 1 tablet (25 mg total) by mouth 2 (two) times daily. 60 tablet 3  . colchicine 0.6 MG tablet Take 1 tablet by mouth daily.    . digoxin (LANOXIN) 0.125 MG tablet Take 1 tablet (0.125 mg total) by mouth daily. 30 tablet 6  . ENTRESTO 49-51 MG take 1 tablet by mouth twice a day 60 tablet 6  . methocarbamol (ROBAXIN) 500 MG tablet Take 1-2 tablets (500-1,000 mg total) by mouth every 6 (six) hours as needed for muscle spasms. 60 tablet 0  . naproxen sodium (ANAPROX DS) 550 MG tablet Take 1 tablet (550 mg total) by mouth 2 (two) times daily with a meal. 30 tablet 0  . potassium chloride SA (K-DUR,KLOR-CON) 20 MEQ tablet take 1 tablet by mouth twice a day 60 tablet 3  . spironolactone (ALDACTONE) 25 MG tablet take 1 tablet by mouth once daily 90 tablet 3  . torsemide (DEMADEX) 20 MG tablet Take 3 tablets (60 mg) by mouth once daily 90 tablet 6  . traMADol (ULTRAM) 50 MG tablet Take 1 tablet (50 mg total) by mouth every 6 (six) hours as needed for moderate pain or severe pain. 15 tablet 0   No current facility-administered medications for this encounter.      PHYSICAL EXAM: Vitals:   07/12/16 1415  BP: 118/84  Pulse: 85  SpO2: 98%  Weight: (!) 369 lb 8 oz (167.6 kg)   Wt Readings from Last 3 Encounters:  07/12/16 (!) 369 lb 8 oz (167.6 kg)  05/12/16 (!) 377 lb 8 oz (171.2 kg)  03/12/16 (!) 371 lb (168.3 kg)   General: Morbidly obese, NAD HEENT: Normal Neck: supple. Thick. JVP difficult to assess. Carotids 2+ bilaterally; no bruits. No thyromegaly or nodule noted. Cor: PMI normal. RRR. No rubs, S3  or murmurs. Lungs: Distant. Clear.  Abdomen: obese, soft, NT, ND, no HSM. No bruits or masses. +BS  Extremities: no cyanosis, clubbing, rash. No edema.  Neuro: alert & orientedx3, cranial nerves grossly intact. Moves all 4 extremities w/o difficulty. Affect pleasant.   ASSESSMENT & PLAN: 1. Chronic Systolic heart Failure-  Medtronic Single lead ICD5/2013  January 2016 ECHO EF 25-30%  -NYHA II. Volume status difficult, but looks stable on exam.   - Continue torsemide 60 mg daily - Continue carvedilol 25 mg BID - Continue digoxin 0.125 mg daily. Level today.  - Increased Entresto to 97/103 mg BID.   -Continue 25 mg spironolactone daily. - May be able to back down on K supp based on labs today.  - Reinforced fluid restriction to < 2 L daily, sodium restriction to less than 2000 mg daily, and the importance of daily weights.   2.Morbid Obesity - Needs to lose a significant amount of weight. Body mass index is 53.02 kg/m.  - Encouraged to cut back portions and increase activity as able. 3. Arthritis - Has pain in hands and both knees.  Have encouraged him to follow up with orthopedics.   Shirley Friar PA-C  2:25 PM  Patient seen and examined with Oda Kilts, PA-C. We discussed all aspects of the encounter. I agree with the assessment and plan as stated above.   Echo reviewed today. ICD interrogated personally. EF stable at 25-30%. NYHA II. Volume status looks great. Will increase Entresto to goal dosing. Check labs.   Bensimhon, Daniel,MD 5:39 PM

## 2016-07-17 ENCOUNTER — Ambulatory Visit (HOSPITAL_COMMUNITY)
Admission: EM | Admit: 2016-07-17 | Discharge: 2016-07-17 | Disposition: A | Discharge status: Home / Self Care | Payer: Medicare Other | Attending: Family Medicine | Admitting: Family Medicine

## 2016-07-17 ENCOUNTER — Ambulatory Visit (INDEPENDENT_AMBULATORY_CARE_PROVIDER_SITE_OTHER): Payer: Medicare Other

## 2016-07-17 ENCOUNTER — Encounter (HOSPITAL_COMMUNITY): Payer: Self-pay | Admitting: *Deleted

## 2016-07-17 DIAGNOSIS — M436 Torticollis: Secondary | ICD-10-CM

## 2016-07-17 MED ORDER — DICLOFENAC SODIUM 75 MG PO TBEC: 75.0000 mg | DELAYED_RELEASE_TABLET | Freq: Two times a day (BID) | ORAL | 0 refills | Status: DC

## 2016-07-17 NOTE — Discharge Instructions (Signed)
Your blood sugar is totally normal with a level of 94.  Believe that you've had some neck strain which occurred while he was sleeping.. You should see improvement with the medicine prescribed in the next 24-48 hours. If her getting worse or develop a fever, please return for reevaluation.

## 2016-07-17 NOTE — ED Provider Notes (Signed)
[facility]    CSN: [id number] Arrival date & time: 07/17/16  1218     History   Chief Complaint Chief Complaint  Patient presents with  . Shoulder Pain  . Neck Pain    HPI William Davila is a 51 y.o. male.   This 51 year old man who has underlying heart disease and diabetes and presents with 5 days of neck and upper shoulder pain. There is no history of trauma. He simply woke with neck soreness such that he has difficulty looking up.  Patient's had no chest pain, shortness of breath. He is not aware of a diagnosis of diabetes.  Patient is disabled and is not working.  Patient's had no difficulty swallowing, moving his shoulders, taking a deep breath.      Past Medical History:  Diagnosis Date  . Arthritis    knee's  . Chronic systolic heart failure ([place])    Diagnosed 2001; prior patient of Dr. Linard Millers with [name] - discharged from practice  . Dilated cardiomyopathy (HCC)    LVEF 10-20%  . Essential hypertension, benign   . Gout   . Gout   . Hyperlipidemia   . 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 Morbid obesity ([place])   . VT (ventricular tachycardia) ([place]) 06/07/2011    Patient Active Problem List   Diagnosis Date Noted  . Arthritis 07/12/2016  . Thrombocytosis ([place]) 09/11/2014  . Diabetes mellitus type 2 in obese ([place]) 07/31/2014  . Health care maintenance 07/24/2014  . Leukocytosis 06/05/2014  . Normocytic anemia 06/05/2014  . Gout 05/06/2014  . 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 Implantable defibrillator-Medtronic atrial and LV port plugged 01/27/2012  . Knee pain, right 06/17/2011  . VT (ventricular tachycardia) ([place]) 06/07/2011  . Chronic combined systolic and diastolic congestive heart failure ([place]) 05/02/2011  . Quadriceps muscle rupture 02/02/2011  . Knee pain, left 12/15/2010  . Elevated uric acid in blood 08/14/2010  . HYPERLIPIDEMIA 07/11/2009  . Morbid obesity ([place]) 05/29/2008  . CARDIOMYOPATHY,  DILATED 05/29/2008  . Essential hypertension 05/29/2008    Past Surgical History:  Procedure Laterality Date  . ICD  10/27/2011  . IMPLANTABLE CARDIOVERTER DEFIBRILLATOR IMPLANT N/A 10/27/2011   Procedure: IMPLANTABLE CARDIOVERTER DEFIBRILLATOR IMPLANT;  Surgeon: Deboraha Sprang, MD;  Location: [facility] CATH LAB;  Service: Cardiovascular;  Laterality: N/A;       Home Medications    Prior to Admission medications   Medication Sig Start Date End Date Taking? Authorizing Provider  acetaminophen (TYLENOL) 500 MG tablet Take 1,000 mg by mouth every 6 (six) hours as needed. For pain   Yes Historical Provider, MD  carvedilol (COREG) 25 MG tablet Take 1 tablet (25 mg total) by mouth 2 (two) times daily. 03/26/16  Yes Jolaine Artist, MD  colchicine 0.6 MG tablet Take 1 tablet by mouth daily. 02/10/16  Yes Historical Provider, MD  digoxin (LANOXIN) 0.125 MG tablet Take 1 tablet (0.125 mg total) by mouth daily. 03/18/16  Yes Deboraha Sprang, MD  potassium chloride SA (K-DUR,KLOR-CON) 20 MEQ tablet take 1 tablet by mouth twice a day 04/06/16  Yes Jolaine Artist, MD  sacubitril-valsartan (ENTRESTO) 97-103 MG Take 1 tablet by mouth 2 (two) times daily. 07/12/16  Yes Jolaine Artist, MD  spironolactone (ALDACTONE) 25 MG tablet take 1 tablet by mouth once daily 07/05/16  Yes Jolaine Artist, MD  torsemide (DEMADEX) 20 MG tablet Take 3 tablets (60 mg) by mouth once daily 03/18/16  Yes Remo Lipps  Peterson Lombard, MD  diclofenac (VOLTAREN) 75 MG EC tablet Take 1 tablet (75 mg total) by mouth 2 (two) times daily. 07/17/16   Robyn Haber, MD  methocarbamol (ROBAXIN) 500 MG tablet Take 1-2 tablets (500-1,000 mg total) by mouth every 6 (six) hours as needed for muscle spasms. 12/03/14   Waldemar Dickens, MD  naproxen sodium (ANAPROX DS) 550 MG tablet Take 1 tablet (550 mg total) by mouth 2 (two) times daily with a meal. 02/06/16   Konrad Felix, PA  traMADol (ULTRAM) 50 MG tablet Take 1 tablet (50 mg total) by mouth every  6 (six) hours as needed for moderate pain or severe pain. 05/31/16   Noland Fordyce, PA-C    Family History Family History  Problem Relation Age of Onset  . Hypertension Mother     Social History Social History  Substance Use Topics  . Smoking status: Never Smoker  . Smokeless tobacco: Never Used  . Alcohol use Yes     Comment: occasionally     Allergies   Beta adrenergic blockers and Isosorb dinitrate-hydralazine   Review of Systems Review of Systems  Constitutional: Negative.   HENT: Negative.   Cardiovascular: Negative.   Gastrointestinal: Negative.   Musculoskeletal: Positive for neck pain.  Neurological: Negative.      Physical Exam Triage Vital Signs ED Triage Vitals  Enc Vitals Group     BP 07/17/16 1338 (!) 100/52     Pulse Rate 07/17/16 1338 87     Resp 07/17/16 1338 20     Temp 07/17/16 1338 98.7 F (37.1 C)     Temp Source 07/17/16 1338 Oral     SpO2 07/17/16 1338 98 %     Weight --      Height --      Head Circumference --      Peak Flow --      Pain Score 07/17/16 1339 8     Pain Loc --      Pain Edu? --      Excl. in [place]? --    No data found.   Updated Vital Signs BP (!) 100/52   Pulse 87   Temp 98.7 F (37.1 C) (Oral)   Resp 20   SpO2 98%    Physical Exam  Constitutional: He is oriented to person, place, and time. He appears well-developed and well-nourished.  HENT:  Head: Normocephalic.  Right Ear: External ear normal.  Left Ear: External ear normal.  Mouth/Throat: Oropharynx is clear and moist.  Eyes: Conjunctivae and EOM are normal.  Neck: Normal range of motion. Neck supple.  Cardiovascular: Normal rate, regular rhythm and normal heart sounds.   Pulmonary/Chest: Effort normal and breath sounds normal.  Musculoskeletal: He exhibits no edema, tenderness or deformity.  Patient able to move his arms and shoulders easily. He does have some difficulty extending his neck, such that he cannot look up at the ceiling without  moderate pain. He can turn his neck to the right and the left without difficulty and he can also put his chin on his chest without difficulty  Neurological: He is alert and oriented to person, place, and time. No cranial nerve deficit or sensory deficit. Coordination normal.  Nursing note and vitals reviewed.    UC Treatments / Results  Labs (all labs ordered are listed, but only abnormal results are displayed) Labs Reviewed - No data to display  EKG  EKG Interpretation None       Radiology No acute  changes seen on C-spine film Procedures Procedures (including critical care time)  Medications Ordered in UC Medications - No data to display   Initial Impression / Assessment and Plan / UC Course  I have reviewed the triage vital signs and the nursing notes.  Pertinent labs & imaging results that were available during my care of the patient were reviewed by me and considered in my medical decision making (see chart for details).     Final Clinical Impressions(s) / UC Diagnoses   Final diagnoses:  Torticollis    New Prescriptions New Prescriptions   DICLOFENAC (VOLTAREN) 75 MG EC TABLET    Take 1 tablet (75 mg total) by mouth 2 (two) times daily.  Your blood sugar is totally normal with a level of 94.  Believe that you've had some neck strain which occurred while he was sleeping.. You should see improvement with the medicine prescribed in the next 24-48 hours. If her getting worse or develop a fever, please return for reevaluation.   Robyn Haber, MD 07/17/16 [phone]

## 2016-07-17 NOTE — ED Triage Notes (Signed)
Denies injury.  C/O posterior and bilateral neck pain with bilat shoulder pain.  Onset 5 days ago.  C/O painful movement.  Has been taking Tyl and IBU without relief.

## 2016-07-19 LAB — GLUCOSE, CAPILLARY: Glucose-Capillary: 94 mg/dL (ref 65–99)

## 2016-07-21 ENCOUNTER — Other Ambulatory Visit: Payer: Self-pay | Admitting: Internal Medicine

## 2016-07-26 ENCOUNTER — Ambulatory Visit (HOSPITAL_COMMUNITY)
Admission: RE | Admit: 2016-07-26 | Discharge: 2016-07-26 | Disposition: A | Discharge status: Home / Self Care | Payer: Medicare Other | Source: Ambulatory Visit | Attending: Internal Medicine | Admitting: Internal Medicine

## 2016-07-26 DIAGNOSIS — I5042 Chronic combined systolic (congestive) and diastolic (congestive) heart failure: Secondary | ICD-10-CM | POA: Insufficient documentation

## 2016-07-26 LAB — BASIC METABOLIC PANEL
Anion gap: 11 (ref 5–15)
BUN: 21 mg/dL — ABNORMAL HIGH (ref 6–20)
CHLORIDE: 100 mmol/L — AB (ref 101–111)
CO2: 26 mmol/L (ref 22–32)
CREATININE: 1.26 mg/dL — AB (ref 0.61–1.24)
Calcium: 9.9 mg/dL (ref 8.9–10.3)
GFR calc non Af Amer: >60 mL/min (ref >60)
GLUCOSE: 125 mg/dL — AB (ref 65–99)
Potassium: 4.1 mmol/L (ref 3.5–5.1)
Sodium: 137 mmol/L (ref 135–145)

## 2016-08-11 ENCOUNTER — Other Ambulatory Visit (HOSPITAL_COMMUNITY): Payer: Self-pay | Admitting: Internal Medicine

## 2016-08-11 DIAGNOSIS — I5022 Chronic systolic (congestive) heart failure: Secondary | ICD-10-CM

## 2016-08-23 ENCOUNTER — Other Ambulatory Visit (HOSPITAL_COMMUNITY): Payer: Self-pay | Admitting: Internal Medicine

## 2016-09-14 ENCOUNTER — Ambulatory Visit (INDEPENDENT_AMBULATORY_CARE_PROVIDER_SITE_OTHER): Payer: Medicare Other | Admitting: *Deleted

## 2016-09-14 DIAGNOSIS — I472 Ventricular tachycardia, unspecified: Secondary | ICD-10-CM

## 2016-09-14 LAB — CUP PACEART REMOTE DEVICE CHECK
Battery Voltage: 3.05 V
Brady Statistic RA Percent Paced: 0 %
HIGH POWER IMPEDANCE MEASURED VALUE: 304 Ohm
HIGH POWER IMPEDANCE MEASURED VALUE: 68 Ohm
HighPow Impedance: 53 Ohm
Implantable Lead Implant Date: 20130515
Implantable Lead Location: 753860
Implantable Lead Model: 7121
Lead Channel Impedance Value: 4047 Ohm
Lead Channel Impedance Value: 437 Ohm
Lead Channel Pacing Threshold Amplitude: 1.375 V
Lead Channel Pacing Threshold Pulse Width: 0.4 ms
Lead Channel Sensing Intrinsic Amplitude: 9.625 mV
Lead Channel Setting Pacing Amplitude: 2.75 V
Lead Channel Setting Sensing Sensitivity: 0.3 mV
MDC IDC MSMT LEADCHNL LV IMPEDANCE VALUE: 4047 Ohm
MDC IDC MSMT LEADCHNL LV IMPEDANCE VALUE: 4047 Ohm
MDC IDC MSMT LEADCHNL RA IMPEDANCE VALUE: 4047 Ohm
MDC IDC MSMT LEADCHNL RA SENSING INTR AMPL: 0.5 mV
MDC IDC MSMT LEADCHNL RA SENSING INTR AMPL: 0.5 mV
MDC IDC MSMT LEADCHNL RV SENSING INTR AMPL: 9.625 mV
MDC IDC PG IMPLANT DT: 20130515
MDC IDC SESS DTM: 20180403083727
MDC IDC SET LEADCHNL RV PACING PULSEWIDTH: 0.4 ms
MDC IDC STAT BRADY AP VP PERCENT: 0 %
MDC IDC STAT BRADY AP VS PERCENT: 0 %
MDC IDC STAT BRADY AS VP PERCENT: 0 %
MDC IDC STAT BRADY AS VS PERCENT: 100 %
MDC IDC STAT BRADY RV PERCENT PACED: 0.01 %

## 2016-09-14 NOTE — Progress Notes (Signed)
Remote ICD transmission.   

## 2016-09-15 ENCOUNTER — Encounter: Payer: Self-pay | Admitting: Cardiology

## 2016-10-14 ENCOUNTER — Other Ambulatory Visit: Payer: Self-pay | Admitting: Internal Medicine

## 2016-10-19 ENCOUNTER — Other Ambulatory Visit: Payer: Self-pay | Admitting: *Deleted

## 2016-10-19 MED ORDER — TORSEMIDE 20 MG PO TABS: ORAL_TABLET | ORAL | 0 refills | Status: DC

## 2016-11-01 ENCOUNTER — Other Ambulatory Visit: Payer: Self-pay | Admitting: Internal Medicine

## 2016-11-04 ENCOUNTER — Other Ambulatory Visit (HOSPITAL_COMMUNITY): Payer: Self-pay | Admitting: Pharmacist

## 2016-11-04 MED ORDER — DIGOXIN 125 MCG PO TABS: 0.1250 mg | ORAL_TABLET | Freq: Every day | ORAL | 6 refills | Status: DC

## 2016-11-28 ENCOUNTER — Other Ambulatory Visit (HOSPITAL_COMMUNITY): Payer: Self-pay | Admitting: Internal Medicine

## 2016-12-14 ENCOUNTER — Ambulatory Visit (INDEPENDENT_AMBULATORY_CARE_PROVIDER_SITE_OTHER): Payer: Medicare Other | Admitting: *Deleted

## 2016-12-14 DIAGNOSIS — I472 Ventricular tachycardia, unspecified: Secondary | ICD-10-CM

## 2016-12-14 NOTE — Progress Notes (Signed)
Remote ICD transmission.   

## 2016-12-16 LAB — CUP PACEART REMOTE DEVICE CHECK
Brady Statistic AP VP Percent: 0 %
Brady Statistic RA Percent Paced: 0 %
Brady Statistic RV Percent Paced: 0.01 %
Date Time Interrogation Session: 20180703052408
HIGH POWER IMPEDANCE MEASURED VALUE: 61 Ohm
HIGH POWER IMPEDANCE MEASURED VALUE: 79 Ohm
HighPow Impedance: 323 Ohm
Implantable Lead Location: 753860
Implantable Lead Model: 7121
Implantable Pulse Generator Implant Date: 20130515
Lead Channel Impedance Value: 4047 Ohm
Lead Channel Pacing Threshold Amplitude: 1.375 V
Lead Channel Pacing Threshold Pulse Width: 0.4 ms
Lead Channel Sensing Intrinsic Amplitude: 0.5 mV
Lead Channel Sensing Intrinsic Amplitude: 11.375 mV
Lead Channel Setting Pacing Pulse Width: 0.4 ms
Lead Channel Setting Sensing Sensitivity: 0.3 mV
MDC IDC LEAD IMPLANT DT: 20130515
MDC IDC MSMT BATTERY VOLTAGE: 3.01 V
MDC IDC MSMT LEADCHNL LV IMPEDANCE VALUE: 4047 Ohm
MDC IDC MSMT LEADCHNL LV IMPEDANCE VALUE: 4047 Ohm
MDC IDC MSMT LEADCHNL LV IMPEDANCE VALUE: 4047 Ohm
MDC IDC MSMT LEADCHNL RA SENSING INTR AMPL: 0.5 mV
MDC IDC MSMT LEADCHNL RV IMPEDANCE VALUE: 494 Ohm
MDC IDC MSMT LEADCHNL RV SENSING INTR AMPL: 11.375 mV
MDC IDC SET LEADCHNL RV PACING AMPLITUDE: 2.75 V
MDC IDC STAT BRADY AP VS PERCENT: 0 %
MDC IDC STAT BRADY AS VP PERCENT: 0 %
MDC IDC STAT BRADY AS VS PERCENT: 100 %

## 2016-12-17 ENCOUNTER — Encounter: Payer: Self-pay | Admitting: Cardiology

## 2017-01-05 ENCOUNTER — Other Ambulatory Visit (HOSPITAL_COMMUNITY): Payer: Self-pay | Admitting: Internal Medicine

## 2017-01-12 ENCOUNTER — Other Ambulatory Visit: Payer: Self-pay | Admitting: Internal Medicine

## 2017-01-12 NOTE — Telephone Encounter (Signed)
This is a CHF pt 

## 2017-01-18 ENCOUNTER — Other Ambulatory Visit (HOSPITAL_COMMUNITY): Payer: Self-pay | Admitting: *Deleted

## 2017-01-18 MED ORDER — TORSEMIDE 20 MG PO TABS: 60.0000 mg | ORAL_TABLET | Freq: Every day | ORAL | 1 refills | Status: DC

## 2017-01-24 ENCOUNTER — Encounter (HOSPITAL_COMMUNITY): Payer: Self-pay | Admitting: Vascular Surgery

## 2017-02-02 ENCOUNTER — Encounter: Payer: Self-pay | Admitting: Internal Medicine

## 2017-02-02 ENCOUNTER — Other Ambulatory Visit (HOSPITAL_COMMUNITY): Payer: Self-pay | Admitting: Internal Medicine

## 2017-02-23 ENCOUNTER — Encounter: Payer: Self-pay | Admitting: Internal Medicine

## 2017-02-23 ENCOUNTER — Ambulatory Visit (INDEPENDENT_AMBULATORY_CARE_PROVIDER_SITE_OTHER): Payer: Medicare Other | Admitting: Internal Medicine

## 2017-02-23 VITALS — BP 110/60 | HR 84 | Ht 69.0 in | Wt 365.0 lb

## 2017-02-23 DIAGNOSIS — I472 Ventricular tachycardia, unspecified: Secondary | ICD-10-CM

## 2017-02-23 DIAGNOSIS — Z95810 Presence of automatic (implantable) cardiac defibrillator: Secondary | ICD-10-CM | POA: Diagnosis not present

## 2017-02-23 DIAGNOSIS — I5022 Chronic systolic (congestive) heart failure: Secondary | ICD-10-CM

## 2017-02-23 DIAGNOSIS — I428 Other cardiomyopathies: Secondary | ICD-10-CM | POA: Diagnosis not present

## 2017-02-23 NOTE — Patient Instructions (Signed)
Medication Instructions: - Your physician recommends that you continue on your current medications as directed. Please refer to the Current Medication list given to you today.  Labwork: - Your physician recommends that you have lab work today: BMP/ Digoxin  Procedures/Testing: - none ordered  Follow-Up: - Remote monitoring is used to monitor your Pacemaker of ICD from home. This monitoring reduces the number of office visits required to check your device to one time per year. It allows [place] to keep an eye on the functioning of your device to ensure it is working properly. You are scheduled for a device check from home on 03/15/17. You may send your transmission at any time that day. If you have a wireless device, the transmission will be sent automatically. After your physician reviews your transmission, you will receive a postcard with your next transmission date.  - Your physician wants you to follow-up in: July 2019 with Chanetta Marshall, NP for Dr. Caryl Comes.  You will receive a reminder letter in the mail two months in advance. If you don't receive a letter, please call our office to schedule the follow-up appointment.   Any Additional Special Instructions Will Be Listed Below (If Applicable).     If you need a refill on your cardiac medications before your next appointment, please call your pharmacy.

## 2017-02-23 NOTE — Progress Notes (Signed)
Patient Care Team: Nolene Ebbs, MD as PCP - General (Internal Medicine)   HPI  William Davila is a 51 y.o. 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 He has a history of nonsustained ventricular tachycardia.  Echo 3/13--EF 20-25% with mild MR. ECHO 06/20/14 EF 25-30% RV normal  Echo 1/18 EF 20-25%    Seen in heart failure clinic [facility] note reviewed although he has not been there in some time.    12/16 Cr 1.21 K  3.8 Date Cr K Dig  12/16 1.21 3.8   2/18 1.26 4.1 0.5    The patient denies chest pain, shortness of breath, nocturnal dyspnea, orthopnea or peripheral edema.  There have been no palpitations, lightheadedness or syncope.   Not very active  Past Medical History:  Diagnosis Date  . Arthritis    knee's  . Chronic systolic heart failure ([place])    Diagnosed 2001; prior patient of Dr. Linard Millers with [name] - discharged from practice  . Dilated cardiomyopathy (HCC)    LVEF 10-20%  . Essential hypertension, benign   . Gout   . Gout   . Hyperlipidemia   . 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 Morbid obesity ([place])   . VT (ventricular tachycardia) ([place]) 06/07/2011    Past Surgical History:  Procedure Laterality Date  . ICD  10/27/2011  . IMPLANTABLE CARDIOVERTER DEFIBRILLATOR IMPLANT N/A 10/27/2011   Procedure: IMPLANTABLE CARDIOVERTER DEFIBRILLATOR IMPLANT;  Surgeon: Deboraha Sprang, MD;  Location: [facility] CATH LAB;  Service: Cardiovascular;  Laterality: N/A;    Current Outpatient Prescriptions  Medication Sig Dispense Refill  . acetaminophen (TYLENOL) 500 MG tablet Take 1,000 mg by mouth every 6 (six) hours as needed. For pain    . carvedilol (COREG) 25 MG tablet take 1 tablet by mouth twice a day 180 tablet 3  . colchicine 0.6 MG tablet Take 1 tablet by mouth daily.    . diclofenac (VOLTAREN) 75 MG EC tablet Take 1 tablet (75 mg total) by mouth 2  (two) times daily. 14 tablet 0  . digoxin (LANOXIN) 0.125 MG tablet Take 1 tablet (0.125 mg total) by mouth daily. 30 tablet 6  . ENTRESTO 97-103 MG take 1 tablet by mouth twice a day 60 tablet 1  . methocarbamol (ROBAXIN) 500 MG tablet Take 1-2 tablets (500-1,000 mg total) by mouth every 6 (six) hours as needed for muscle spasms. 60 tablet 0  . naproxen sodium (ANAPROX DS) 550 MG tablet Take 1 tablet (550 mg total) by mouth 2 (two) times daily with a meal. 30 tablet 0  . potassium chloride SA (K-DUR,KLOR-CON) 20 MEQ tablet take 1 tablet by mouth twice a day 60 tablet 1  . spironolactone (ALDACTONE) 25 MG tablet take 1 tablet by mouth once daily 90 tablet 3  . torsemide (DEMADEX) 20 MG tablet Take 3 tablets (60 mg total) by mouth daily. 270 tablet 1  . traMADol (ULTRAM) 50 MG tablet Take 1 tablet (50 mg total) by mouth every 6 (six) hours as needed for moderate pain or severe pain. 15 tablet 0   No current facility-administered medications for this visit.     Allergies  Allergen Reactions  . Beta Adrenergic Blockers Other (See Comments)    REACTION: fatigue and increased dyspnea with beta blockers.  Marland Kitchen Isosorb Dinitrate-Hydralazine Other (See Comments)    REACTION: headache    Review  of Systems negative except from HPI and PMH  Physical Exam BP 110/60   Pulse 84   Ht 5\' 9"  (1.753 m)   Wt (!) 365 lb (165.6 kg)   SpO2 98%   BMI 53.90 kg/m  Well developed and well nourished in no acute distress HENT normal E scleral and icterus clear Neck Supple JVP flat; carotids brisk and full Clear to ausculation  Regular rate and rhythm, no murmurs gallops or rub Soft with active bowel sounds No clubbing cyanosis no  Edema Alert and oriented, grossly normal motor and sensory function Skin Warm and Dry  ECG demonstrated sinus rate at 81 25/14/39 Axis I -46  Assessment and  Plan  NICM   ICD The patient's device was interrogated.  The information was reviewed. No changes were made in  the programming.     CHF   High Risk Medication Surveillance  Morbidly obese     HTN  Euvolemic continue current meds  survieillance labs today  Again encouraged weight loss  On Guideline directed medical therapy

## 2017-02-24 LAB — DIGOXIN LEVEL: DIGOXIN, SERUM: 0.6 ng/mL (ref 0.5–0.9)

## 2017-02-24 LAB — BASIC METABOLIC PANEL
BUN / CREAT RATIO: 15 (ref 9–20)
BUN: 16 mg/dL (ref 6–24)
CALCIUM: 9.7 mg/dL (ref 8.7–10.2)
CO2: 22 mmol/L (ref 20–29)
CREATININE: 1.05 mg/dL (ref 0.76–1.27)
Chloride: 102 mmol/L (ref 96–106)
GFR calc Af Amer: 96 mL/min/{1.73_m2} (ref >59)
GFR calc non Af Amer: 83 mL/min/{1.73_m2} (ref >59)
GLUCOSE: 86 mg/dL (ref 65–99)
Potassium: 4.5 mmol/L (ref 3.5–5.2)
Sodium: 142 mmol/L (ref 134–144)

## 2017-03-03 LAB — CUP PACEART INCLINIC DEVICE CHECK
Battery Voltage: 3.01 V
Brady Statistic AS VP Percent: 0 %
Brady Statistic RA Percent Paced: 0 %
HIGH POWER IMPEDANCE MEASURED VALUE: 69 Ohm
HighPow Impedance: 304 Ohm
HighPow Impedance: 51 Ohm
Implantable Lead Implant Date: 20130515
Implantable Lead Location: 753860
Implantable Lead Model: 7121
Implantable Pulse Generator Implant Date: 20130515
Lead Channel Impedance Value: 4047 Ohm
Lead Channel Impedance Value: 494 Ohm
Lead Channel Pacing Threshold Amplitude: 1.5 V
Lead Channel Sensing Intrinsic Amplitude: 9.75 mV
Lead Channel Setting Pacing Amplitude: 3 V
Lead Channel Setting Pacing Pulse Width: 0.4 ms
MDC IDC MSMT LEADCHNL LV IMPEDANCE VALUE: 4047 Ohm
MDC IDC MSMT LEADCHNL LV IMPEDANCE VALUE: 4047 Ohm
MDC IDC MSMT LEADCHNL RA IMPEDANCE VALUE: 4047 Ohm
MDC IDC MSMT LEADCHNL RA SENSING INTR AMPL: 0.5 mV
MDC IDC MSMT LEADCHNL RA SENSING INTR AMPL: 0.5 mV
MDC IDC MSMT LEADCHNL RV PACING THRESHOLD PULSEWIDTH: 0.4 ms
MDC IDC MSMT LEADCHNL RV SENSING INTR AMPL: 14.75 mV
MDC IDC SESS DTM: 20180912165450
MDC IDC SET LEADCHNL RV SENSING SENSITIVITY: 0.3 mV
MDC IDC STAT BRADY AP VP PERCENT: 0 %
MDC IDC STAT BRADY AP VS PERCENT: 0 %
MDC IDC STAT BRADY AS VS PERCENT: 100 %
MDC IDC STAT BRADY RV PERCENT PACED: 0.01 %

## 2017-03-15 ENCOUNTER — Ambulatory Visit (INDEPENDENT_AMBULATORY_CARE_PROVIDER_SITE_OTHER): Payer: Medicare Other | Admitting: *Deleted

## 2017-03-15 DIAGNOSIS — I428 Other cardiomyopathies: Secondary | ICD-10-CM

## 2017-03-15 NOTE — Progress Notes (Signed)
Remote ICD transmission.   

## 2017-03-16 ENCOUNTER — Encounter: Payer: Self-pay | Admitting: Cardiology

## 2017-03-17 ENCOUNTER — Encounter: Payer: Self-pay | Admitting: *Deleted

## 2017-03-17 LAB — CUP PACEART REMOTE DEVICE CHECK
Battery Voltage: 3.01 V
Brady Statistic AP VP Percent: 0 %
Brady Statistic AS VP Percent: 0 %
Brady Statistic AS VS Percent: 100 %
Brady Statistic RA Percent Paced: 0 %
Brady Statistic RV Percent Paced: 0 %
HIGH POWER IMPEDANCE MEASURED VALUE: 58 Ohm
HIGH POWER IMPEDANCE MEASURED VALUE: 76 Ohm
HighPow Impedance: 323 Ohm
Implantable Lead Location: 753860
Lead Channel Impedance Value: 4047 Ohm
Lead Channel Pacing Threshold Amplitude: 1.25 V
Lead Channel Pacing Threshold Pulse Width: 0.4 ms
Lead Channel Sensing Intrinsic Amplitude: 0.5 mV
Lead Channel Sensing Intrinsic Amplitude: 11.5 mV
Lead Channel Setting Pacing Pulse Width: 0.4 ms
Lead Channel Setting Sensing Sensitivity: 0.3 mV
MDC IDC LEAD IMPLANT DT: 20130515
MDC IDC MSMT LEADCHNL LV IMPEDANCE VALUE: 4047 Ohm
MDC IDC MSMT LEADCHNL LV IMPEDANCE VALUE: 4047 Ohm
MDC IDC MSMT LEADCHNL RA IMPEDANCE VALUE: 4047 Ohm
MDC IDC MSMT LEADCHNL RA SENSING INTR AMPL: 0.5 mV
MDC IDC MSMT LEADCHNL RV IMPEDANCE VALUE: 513 Ohm
MDC IDC MSMT LEADCHNL RV SENSING INTR AMPL: 11.5 mV
MDC IDC PG IMPLANT DT: 20130515
MDC IDC SESS DTM: 20181002041806
MDC IDC SET LEADCHNL RV PACING AMPLITUDE: 2.75 V
MDC IDC STAT BRADY AP VS PERCENT: 0 %

## 2017-03-20 ENCOUNTER — Other Ambulatory Visit (HOSPITAL_COMMUNITY): Payer: Self-pay | Admitting: Internal Medicine

## 2017-04-04 ENCOUNTER — Ambulatory Visit (HOSPITAL_COMMUNITY)
Admission: RE | Admit: 2017-04-04 | Discharge: 2017-04-04 | Disposition: A | Discharge status: Home / Self Care | Payer: Medicare Other | Source: Ambulatory Visit | Attending: Internal Medicine | Admitting: Internal Medicine

## 2017-04-04 ENCOUNTER — Encounter (HOSPITAL_COMMUNITY): Payer: Self-pay | Admitting: Internal Medicine

## 2017-04-04 VITALS — BP 108/69 | HR 93 | Wt 371.4 lb

## 2017-04-04 DIAGNOSIS — E785 Hyperlipidemia, unspecified: Secondary | ICD-10-CM | POA: Insufficient documentation

## 2017-04-04 DIAGNOSIS — I110 Hypertensive heart disease with heart failure: Secondary | ICD-10-CM | POA: Insufficient documentation

## 2017-04-04 DIAGNOSIS — I420 Dilated cardiomyopathy: Secondary | ICD-10-CM | POA: Insufficient documentation

## 2017-04-04 DIAGNOSIS — I5042 Chronic combined systolic (congestive) and diastolic (congestive) heart failure: Secondary | ICD-10-CM

## 2017-04-04 DIAGNOSIS — R51 Headache: Secondary | ICD-10-CM | POA: Insufficient documentation

## 2017-04-04 DIAGNOSIS — Z6841 Body Mass Index (BMI) 40.0 and over, adult: Secondary | ICD-10-CM | POA: Diagnosis not present

## 2017-04-04 DIAGNOSIS — Z79891 Long term (current) use of opiate analgesic: Secondary | ICD-10-CM | POA: Diagnosis not present

## 2017-04-04 DIAGNOSIS — Z79899 Other long term (current) drug therapy: Secondary | ICD-10-CM | POA: Diagnosis not present

## 2017-04-04 DIAGNOSIS — M1990 Unspecified osteoarthritis, unspecified site: Secondary | ICD-10-CM | POA: Insufficient documentation

## 2017-04-04 DIAGNOSIS — M109 Gout, unspecified: Secondary | ICD-10-CM | POA: Insufficient documentation

## 2017-04-04 DIAGNOSIS — Z95810 Presence of automatic (implantable) cardiac defibrillator: Secondary | ICD-10-CM | POA: Diagnosis not present

## 2017-04-04 DIAGNOSIS — I5022 Chronic systolic (congestive) heart failure: Secondary | ICD-10-CM | POA: Diagnosis not present

## 2017-04-04 DIAGNOSIS — Z791 Long term (current) use of non-steroidal anti-inflammatories (NSAID): Secondary | ICD-10-CM | POA: Diagnosis not present

## 2017-04-04 DIAGNOSIS — I472 Ventricular tachycardia, unspecified: Secondary | ICD-10-CM

## 2017-04-04 LAB — BASIC METABOLIC PANEL
Anion gap: 7 (ref 5–15)
BUN: 13 mg/dL (ref 6–20)
CHLORIDE: 103 mmol/L (ref 101–111)
CO2: 27 mmol/L (ref 22–32)
CREATININE: 1.16 mg/dL (ref 0.61–1.24)
Calcium: 9.2 mg/dL (ref 8.9–10.3)
GFR calc Af Amer: >60 mL/min (ref >60)
GFR calc non Af Amer: >60 mL/min (ref >60)
GLUCOSE: 110 mg/dL — AB (ref 65–99)
Potassium: 3.9 mmol/L (ref 3.5–5.1)
SODIUM: 137 mmol/L (ref 135–145)

## 2017-04-04 LAB — DIGOXIN LEVEL: Digoxin Level: 0.5 ng/mL — ABNORMAL LOW (ref 0.8–2.0)

## 2017-04-04 NOTE — Patient Instructions (Signed)
Labs today  We will contact you in 6 months to schedule your next appointment and echocardiogram  

## 2017-04-04 NOTE — Progress Notes (Signed)
Patient ID: William Davila, male   DOB: May 15, 1966, 51 y.o.   MRN: [id number]     Ortho: Dr Nori Riis  PCP: [facility] EP: Dr Caryl Comes  HF MD: Dr Haroldine Laws  HPI: William Davila is a 51 y.o. male with systolic heart failure , cardiolmyopathy diagnosed 2001, hyperlipidemia, HTN, and obesity. S/P St Jude single chamber ICD 10/2011. Intolerant Bidil due to headaches.    Discharged from [facility] 05/10/2011 Discharge weight 328.  Massive volume over load and low output. Cardiac output increased inotropes. Medications initiated ace, beta blocker , digoxin, and torsemide.   He presents today for regular follow up. Has been feeling good overall. He denies any DOE. He can walk up steps (apart from his arthritis), inclines, and on flat ground.  Denies dizziness or lightheadedness. No orthopnea, PND, or CP. Hasn't needed any extra torsemide. Struggles with gout in his hand. Not very active. 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   Optivol: Fluid index trending up, thoracic impedence below threshold. Active for ~ 1 hr a day. No VT/VF.  (ICD interrogation done personally)  Echo 08/2011 EF 20-25% Echo 06/20/14 EF 25-30% RV normal  Echo 07/12/16 EF 25-30%  Labs 06/04/14 K 3.6 Creatinine 0.96.  Labs 04/30/2015: 3.0  Labs 05/07/2015 K 3.4 Creatinine   Review of systems complete and found to be negative unless listed in HPI.    Past Medical History:  Diagnosis Date  . Arthritis    knee's  . Chronic systolic heart failure ([place])    Diagnosed 2001; prior patient of Dr. Linard Millers with [facility] - discharged from practice  . Dilated cardiomyopathy (HCC)    LVEF 10-20%  . Essential hypertension, benign   . Gout   . Gout   . Hyperlipidemia   . 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 Morbid obesity ([facility] Davila)   . VT (ventricular tachycardia) ([place]) 06/07/2011    Current Outpatient Prescriptions  Medication Sig Dispense Refill  . acetaminophen (TYLENOL) 500 MG tablet Take  1,000 mg by mouth every 6 (six) hours as needed. For pain    . carvedilol (COREG) 25 MG tablet take 1 tablet by mouth twice a day 180 tablet 3  . colchicine 0.6 MG tablet Take 1 tablet by mouth daily.    . diclofenac (VOLTAREN) 75 MG EC tablet Take 1 tablet (75 mg total) by mouth 2 (two) times daily. 14 tablet 0  . digoxin (LANOXIN) 0.125 MG tablet Take 1 tablet (0.125 mg total) by mouth daily. 30 tablet 6  . ENTRESTO 97-103 MG take 1 tablet by mouth twice a day 60 tablet 1  . methocarbamol (ROBAXIN) 500 MG tablet Take 1-2 tablets (500-1,000 mg total) by mouth every 6 (six) hours as needed for muscle spasms. 60 tablet 0  . naproxen sodium (ANAPROX DS) 550 MG tablet Take 1 tablet (550 mg total) by mouth 2 (two) times daily with a meal. 30 tablet 0  . potassium chloride SA (K-DUR,KLOR-CON) 20 MEQ tablet take 1 tablet by mouth twice a day 60 tablet 1  . spironolactone (ALDACTONE) 25 MG tablet take 1 tablet by mouth once daily 90 tablet 3  . torsemide (DEMADEX) 20 MG tablet Take 3 tablets (60 mg total) by mouth daily. 270 tablet 1  . traMADol (ULTRAM) 50 MG tablet Take 1 tablet (50 mg total) by mouth every 6 (six) hours as needed for moderate pain or severe pain. 15 tablet 0   No current facility-administered  medications for this encounter.      PHYSICAL EXAM: Vitals:   04/04/17 1431  BP: 108/69  Pulse: 93  SpO2: 98%  Weight: (!) 371 lb 6.4 oz (168.5 kg)     Wt Readings from Last 3 Encounters:  04/04/17 (!) 371 lb 6.4 oz (168.5 kg)  02/23/17 (!) 365 lb (165.6 kg)  07/12/16 (!) 369 lb 8 oz (167.6 kg)   General: Well appearing. No resp difficulty. HEENT: Normal Neck: Supple. JVP difficult, but does not appear markedly elevated. Carotids 2+ bilat; no bruits. No thyromegaly or nodule noted. Cor: PMI laterally displaced. RRR, No M/G/R noted. No S3.  Lungs: CTAB, normal effort. Abdomen: Obese, soft, non-tender, non-distended, no HSM. No bruits or masses. +BS  Extremities: No cyanosis,  clubbing, or rash. R and LLE no edema.  Neuro: Alert & orientedx3, cranial nerves grossly intact. moves all 4 extremities w/o difficulty. Affect pleasant   ASSESSMENT & PLAN: 1. Chronic Systolic heart Failure- Medtronic Single lead ICD5/2013   - Echo 06/2016 LVEF 20-25%.  - NYHA II - Volume status mildly elevated by optivol - Continue torsemide 60 mg daily. Have asked him to take 80 mg for the next 1-2 days with mild overload on Optivol.  - Continue carvedilol 25 mg BID - Continue digoxin 0.125 mg daily.  - Continue Entresto 97/103 mg BID.   - Continue spironolactone 25 mg daily.  - May be able to back down on K supp based on labs today.  - Reinforced fluid restriction to < 2 L daily, sodium restriction to less than 2000 mg daily, and the importance of daily weights.   2.Morbid Obesity - Body mass index is 54.85 kg/m.  - Encouraged to cut back portion sizes. Needs to lose a significant amount of weight.  3. Arthritis - In hands and both knees.   Doing well overall. Labs today.   Shirley Friar, PA-C  2:39 PM   Patient seen and examined with the above-signed Advanced Practice Provider and/or Housestaff. I personally reviewed laboratory data, imaging studies and relevant notes. I independently examined the patient and formulated the important aspects of the plan. I have edited the note to reflect any of my changes or salient points. I have personally discussed the plan with the patient and/or family.  Overall doing very well. NYHA II. Volume status minimally elevated in setting of excess fluid intake. Reinforced need for daily weights and reviewed use of sliding scale diuretics. Will increase torsemide for 1-2 days. Check labs today. ICD interrogated personally in clinic. No VT/AF.   Glori Bickers, MD  3:07 PM

## 2017-04-13 ENCOUNTER — Other Ambulatory Visit (HOSPITAL_COMMUNITY): Payer: Self-pay | Admitting: *Deleted

## 2017-04-13 MED ORDER — SACUBITRIL-VALSARTAN 97-103 MG PO TABS: 1.0000 | ORAL_TABLET | Freq: Two times a day (BID) | ORAL | 3 refills | Status: DC

## 2017-04-25 ENCOUNTER — Other Ambulatory Visit: Payer: Self-pay | Admitting: Internal Medicine

## 2017-05-30 ENCOUNTER — Other Ambulatory Visit (HOSPITAL_COMMUNITY): Payer: Self-pay | Admitting: *Deleted

## 2017-05-30 MED ORDER — POTASSIUM CHLORIDE CRYS ER 20 MEQ PO TBCR: 20.0000 meq | EXTENDED_RELEASE_TABLET | Freq: Two times a day (BID) | ORAL | 3 refills | Status: DC

## 2017-06-10 ENCOUNTER — Other Ambulatory Visit (HOSPITAL_COMMUNITY): Payer: Self-pay

## 2017-06-10 ENCOUNTER — Other Ambulatory Visit (HOSPITAL_COMMUNITY): Payer: Self-pay | Admitting: Pharmacist

## 2017-06-10 MED ORDER — DIGOXIN 125 MCG PO TABS: 0.1250 mg | ORAL_TABLET | Freq: Every day | ORAL | 11 refills | Status: DC

## 2017-06-10 MED ORDER — DIGOXIN 125 MCG PO TABS: 0.1250 mg | ORAL_TABLET | Freq: Every day | ORAL | 6 refills | Status: DC

## 2017-06-15 ENCOUNTER — Ambulatory Visit (INDEPENDENT_AMBULATORY_CARE_PROVIDER_SITE_OTHER): Payer: Medicare Other | Admitting: *Deleted

## 2017-06-15 DIAGNOSIS — I472 Ventricular tachycardia, unspecified: Secondary | ICD-10-CM

## 2017-06-16 NOTE — Progress Notes (Signed)
Remote ICD transmission.   

## 2017-06-17 ENCOUNTER — Encounter: Payer: Self-pay | Admitting: Cardiology

## 2017-06-20 LAB — CUP PACEART REMOTE DEVICE CHECK
Battery Voltage: 2.98 V
HIGH POWER IMPEDANCE MEASURED VALUE: 323 Ohm
HIGH POWER IMPEDANCE MEASURED VALUE: 54 Ohm
HIGH POWER IMPEDANCE MEASURED VALUE: 73 Ohm
Implantable Lead Implant Date: 20130515
Implantable Lead Location: 753860
Implantable Lead Model: 7121
Implantable Pulse Generator Implant Date: 20130515
Lead Channel Impedance Value: 4047 Ohm
Lead Channel Impedance Value: 4047 Ohm
Lead Channel Impedance Value: 570 Ohm
Lead Channel Pacing Threshold Amplitude: 1.125 V
Lead Channel Sensing Intrinsic Amplitude: 0.5 mV
Lead Channel Sensing Intrinsic Amplitude: 8.625 mV
Lead Channel Sensing Intrinsic Amplitude: 8.625 mV
Lead Channel Setting Pacing Pulse Width: 0.4 ms
MDC IDC MSMT LEADCHNL LV IMPEDANCE VALUE: 4047 Ohm
MDC IDC MSMT LEADCHNL RA IMPEDANCE VALUE: 4047 Ohm
MDC IDC MSMT LEADCHNL RA SENSING INTR AMPL: 0.5 mV
MDC IDC MSMT LEADCHNL RV PACING THRESHOLD PULSEWIDTH: 0.4 ms
MDC IDC SESS DTM: 20190102073328
MDC IDC SET LEADCHNL RV PACING AMPLITUDE: 2.5 V
MDC IDC SET LEADCHNL RV SENSING SENSITIVITY: 0.3 mV
MDC IDC STAT BRADY AP VP PERCENT: 0 %
MDC IDC STAT BRADY AP VS PERCENT: 0 %
MDC IDC STAT BRADY AS VP PERCENT: 0 %
MDC IDC STAT BRADY AS VS PERCENT: 100 %
MDC IDC STAT BRADY RA PERCENT PACED: 0 %
MDC IDC STAT BRADY RV PERCENT PACED: 0.01 %

## 2017-06-30 ENCOUNTER — Other Ambulatory Visit (HOSPITAL_COMMUNITY): Payer: Self-pay | Admitting: Pharmacist

## 2017-06-30 MED ORDER — SPIRONOLACTONE 25 MG PO TABS: 25.0000 mg | ORAL_TABLET | Freq: Every day | ORAL | 3 refills | Status: DC

## 2017-07-12 ENCOUNTER — Other Ambulatory Visit (HOSPITAL_COMMUNITY): Payer: Self-pay | Admitting: *Deleted

## 2017-07-12 MED ORDER — TORSEMIDE 20 MG PO TABS: 60.0000 mg | ORAL_TABLET | Freq: Every day | ORAL | 1 refills | Status: DC

## 2017-08-26 ENCOUNTER — Other Ambulatory Visit (HOSPITAL_COMMUNITY): Payer: Self-pay | Admitting: Internal Medicine

## 2017-08-29 ENCOUNTER — Other Ambulatory Visit (HOSPITAL_COMMUNITY): Payer: Self-pay | Admitting: *Deleted

## 2017-08-29 MED ORDER — SACUBITRIL-VALSARTAN 97-103 MG PO TABS: 1.0000 | ORAL_TABLET | Freq: Two times a day (BID) | ORAL | 3 refills | Status: DC

## 2017-09-14 ENCOUNTER — Ambulatory Visit (INDEPENDENT_AMBULATORY_CARE_PROVIDER_SITE_OTHER): Payer: Medicare Other | Admitting: *Deleted

## 2017-09-14 DIAGNOSIS — I472 Ventricular tachycardia, unspecified: Secondary | ICD-10-CM

## 2017-09-14 NOTE — Progress Notes (Signed)
Remote ICD transmission.   

## 2017-09-15 ENCOUNTER — Encounter: Payer: Self-pay | Admitting: Cardiology

## 2017-09-23 ENCOUNTER — Other Ambulatory Visit (HOSPITAL_COMMUNITY): Payer: Self-pay | Admitting: *Deleted

## 2017-09-23 DIAGNOSIS — I5022 Chronic systolic (congestive) heart failure: Secondary | ICD-10-CM

## 2017-09-23 MED ORDER — CARVEDILOL 25 MG PO TABS: 25.0000 mg | ORAL_TABLET | Freq: Two times a day (BID) | ORAL | 3 refills | Status: DC

## 2017-09-27 LAB — CUP PACEART REMOTE DEVICE CHECK
Battery Voltage: 2.97 V
Brady Statistic AS VP Percent: 0 %
Brady Statistic AS VS Percent: 100 %
HIGH POWER IMPEDANCE MEASURED VALUE: 323 Ohm
HIGH POWER IMPEDANCE MEASURED VALUE: 82 Ohm
HighPow Impedance: 64 Ohm
Implantable Lead Implant Date: 20130515
Implantable Pulse Generator Implant Date: 20130515
Lead Channel Impedance Value: 4047 Ohm
Lead Channel Impedance Value: 4047 Ohm
Lead Channel Impedance Value: 4047 Ohm
Lead Channel Impedance Value: 627 Ohm
Lead Channel Sensing Intrinsic Amplitude: 0.5 mV
Lead Channel Sensing Intrinsic Amplitude: 0.5 mV
Lead Channel Sensing Intrinsic Amplitude: 11.5 mV
Lead Channel Sensing Intrinsic Amplitude: 11.5 mV
Lead Channel Setting Pacing Amplitude: 2.5 V
MDC IDC LEAD LOCATION: 753860
MDC IDC MSMT LEADCHNL RA IMPEDANCE VALUE: 4047 Ohm
MDC IDC MSMT LEADCHNL RV PACING THRESHOLD AMPLITUDE: 1 V
MDC IDC MSMT LEADCHNL RV PACING THRESHOLD PULSEWIDTH: 0.4 ms
MDC IDC SESS DTM: 20190403062831
MDC IDC SET LEADCHNL RV PACING PULSEWIDTH: 0.4 ms
MDC IDC SET LEADCHNL RV SENSING SENSITIVITY: 0.3 mV
MDC IDC STAT BRADY AP VP PERCENT: 0 %
MDC IDC STAT BRADY AP VS PERCENT: 0 %
MDC IDC STAT BRADY RA PERCENT PACED: 0 %
MDC IDC STAT BRADY RV PERCENT PACED: 0.01 %

## 2017-10-03 ENCOUNTER — Encounter: Payer: Self-pay | Admitting: Gastroenterology

## 2017-10-08 ENCOUNTER — Other Ambulatory Visit (HOSPITAL_COMMUNITY): Payer: Self-pay | Admitting: Internal Medicine

## 2017-10-12 ENCOUNTER — Other Ambulatory Visit (HOSPITAL_COMMUNITY): Payer: Self-pay | Admitting: *Deleted

## 2017-10-12 MED ORDER — TORSEMIDE 20 MG PO TABS: 60.0000 mg | ORAL_TABLET | Freq: Every day | ORAL | 1 refills | Status: DC

## 2017-10-19 ENCOUNTER — Other Ambulatory Visit (HOSPITAL_COMMUNITY): Payer: Self-pay | Admitting: Internal Medicine

## 2017-11-14 ENCOUNTER — Other Ambulatory Visit (HOSPITAL_COMMUNITY): Payer: Self-pay | Admitting: *Deleted

## 2017-11-14 MED ORDER — POTASSIUM CHLORIDE CRYS ER 20 MEQ PO TBCR: 20.0000 meq | EXTENDED_RELEASE_TABLET | Freq: Two times a day (BID) | ORAL | 3 refills | Status: DC

## 2017-11-18 ENCOUNTER — Ambulatory Visit (INDEPENDENT_AMBULATORY_CARE_PROVIDER_SITE_OTHER): Payer: Medicare Other | Admitting: Gastroenterology

## 2017-11-18 ENCOUNTER — Encounter: Payer: Self-pay | Admitting: Gastroenterology

## 2017-11-18 VITALS — BP 96/64 | HR 72 | Ht 69.5 in | Wt 380.4 lb

## 2017-11-18 DIAGNOSIS — I5022 Chronic systolic (congestive) heart failure: Secondary | ICD-10-CM | POA: Diagnosis not present

## 2017-11-18 DIAGNOSIS — Z1211 Encounter for screening for malignant neoplasm of colon: Secondary | ICD-10-CM | POA: Diagnosis not present

## 2017-11-18 MED ORDER — NA SULFATE-K SULFATE-MG SULF 17.5-3.13-1.6 GM/177ML PO SOLN: 1.0000 | ORAL | 0 refills | Status: DC

## 2017-11-18 NOTE — Progress Notes (Signed)
HPI :  50 male with systolic heart failure, cardiolmyopathy diagnosed 2001, hyperlipidemia, HTN, and obesity (BMI 55), s/p St Jude single chamber ICD 10/2011, here to discuss colon cancer screening. His last Echo was done 07/12/2016 - EF 20-25%  He is ever had a prior colonoscopy. He denies any changes in his bowels. He has regular bowel movements. No od in his stools. No abdominal pains. No family history of colon cancer. He denies any cardiopulmonary symptoms at present time. Lab work from his primary care's office on 09/13/2017 shows hemoglobin of 14.2, MCV 86, white blood cell 9.4, platelets 42. Normal LFTs, normal renal function. He denies any problems with anesthesia in the past. We had a discussion as below to determine if he would like colon cancer screening and which modality he would prefer.    Past Medical History:  Diagnosis Date  . Arthritis    knee's  . Cardiomyopathy ([place])   . Chronic systolic heart failure ([place])    Diagnosed 2001; prior patient of Dr. Linard Millers with [name] - discharged from practice  . Dilated cardiomyopathy (HCC)    LVEF 10-20%  . Essential hypertension, benign   . Gout   . Hyperlipidemia   . Hypertension   . 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 Morbid obesity ([place])   . Presence of combination internal cardiac defibrillator (ICD) and pacemaker   . VT (ventricular tachycardia) ([place]) 06/07/2011     Past Surgical History:  Procedure Laterality Date  . ICD  10/27/2011  . IMPLANTABLE CARDIOVERTER DEFIBRILLATOR IMPLANT N/A 10/27/2011   Procedure: IMPLANTABLE CARDIOVERTER DEFIBRILLATOR IMPLANT;  Surgeon: Deboraha Sprang, MD;  Location: [facility] C[facility] LAB;  Service: Cardiovascular;  Laterality: N/A;   Family History  Problem Relation Age of Onset  . Hypertension Mother    Social History   Tobacco Use  . Smoking status: Never Smoker  . Smokeless tobacco: Never Used  Substance Use Topics  . Alcohol use: Yes    Comment: occasionally  (1-2 per week)  . Drug use: No   Current Outpatient Medications  Medication Sig Dispense Refill  . acetaminophen (TYLENOL) 500 MG tablet Take 1,000 mg by mouth every 6 (six) hours as needed. For pain    . carvedilol (COREG) 25 MG tablet Take 1 tablet (25 mg total) by mouth 2 (two) times daily. 180 tablet 3  . colchicine 0.6 MG tablet Take 1 tablet by mouth daily.    . digoxin (DIGOX) 0.125 MG tablet Take 1 tablet (125 mcg total) by mouth daily. Please call for OV [phone] 90 tablet 0  . naproxen sodium (ANAPROX DS) 550 MG tablet Take 1 tablet (550 mg total) by mouth 2 (two) times daily with a meal. 30 tablet 0  . potassium chloride SA (K-DUR,KLOR-CON) 20 MEQ tablet Take 1 tablet (20 mEq total) by mouth 2 (two) times daily. 60 tablet 3  . sacubitril-valsartan (ENTRESTO) 97-103 MG Take 1 tablet by mouth 2 (two) times daily. 60 tablet 3  . spironolactone (ALDACTONE) 25 MG tablet Take 1 tablet (25 mg total) by mouth daily. 90 tablet 3  . torsemide (DEMADEX) 20 MG tablet Take 3 tablets (60 mg total) by mouth daily. 270 tablet 1  . Na Sulfate-K Sulfate-Mg Sulf (SUPREP BOWEL PREP KIT) 17.5-3.13-1.6 GM/177ML SOLN Take 1 kit by mouth as directed. 324 mL 0   No current facility-administered medications for this visit.    Allergies  Allergen Reactions  . Beta Adrenergic Blockers Other (See Comments)  REACTION: fatigue and increased dyspnea with beta blockers.  . Isosorb Dinitrate-Hydralazine Other (See Comments)    REACTION: headache     Review of Systems: All systems reviewed and negative except where noted in HPI.   Labs per HPI  Physical Exam: BP 96/64 (BP Location: Left Wrist, Patient Position: Sitting, Cuff Size: Normal)   Pulse 72 Comment: irregular  Ht 5' 9.5" (1.765 m) Comment: height measured without shoes  Wt (!) 380 lb 6 oz (172.5 kg)   BMI 55.37 kg/m  Constitutional: Pleasant, male in no acute distress. HEENT: Normocephalic and atraumatic. Conjunctivae are normal. No  scleral icterus. Neck supple.  Cardiovascular: Normal rate, regular rhythm.  Pulmonary/chest: Effort normal and breath sounds normal. No wheezing, rales or rhonchi. Abdominal: Soft, protuberant, nontender.  There are no masses palpable. No hepatomegaly. Extremities: no edema Lymphadenopathy: No cervical adenopathy noted. Neurological: Alert and oriented to person place and time. Skin: Skin is warm and dry. No rashes noted. Psychiatric: Normal mood and affect. Behavior is normal.   ASSESSMENT AND PLAN: 52 year old male with a history of CHF, ICD in place, history of obesity with BMI of 55, here to discuss options for colon cancer screening.  He denies any cardiopulmonary symptoms currently and seems to be stable from cardiac perspective at this time. I discussed screening options with him to include optical colonoscopy with anesthesia versus stool based testing. We discussed risks and benefits of each of these modalities with him. He is higher than average risk for anesthesia, while stool testing is not accurate and not preventative as colonoscopy is. We discussed this issue for a bit, and following this discussion he wanted to proceed with optical colonoscopy. His exam will need to be done at the hospital with anesthesia support in light of his heart failure and body mass index. This will be tentatively scheduled for July. He'll contact me in the interim with any questions or concerns.  [place] Cellar, MD [facility]  CC: Nolene Ebbs, MD

## 2017-11-18 NOTE — Patient Instructions (Signed)

## 2017-11-23 ENCOUNTER — Encounter (HOSPITAL_COMMUNITY): Payer: Self-pay | Admitting: Emergency Medicine

## 2017-11-23 ENCOUNTER — Ambulatory Visit (HOSPITAL_COMMUNITY)
Admission: EM | Admit: 2017-11-23 | Discharge: 2017-11-23 | Disposition: A | Discharge status: Home / Self Care | Payer: Medicare Other | Attending: Family Medicine | Admitting: Family Medicine

## 2017-11-23 ENCOUNTER — Other Ambulatory Visit: Payer: Self-pay

## 2017-11-23 ENCOUNTER — Other Ambulatory Visit (HOSPITAL_COMMUNITY): Payer: Self-pay | Admitting: Internal Medicine

## 2017-11-23 DIAGNOSIS — M25561 Pain in right knee: Secondary | ICD-10-CM

## 2017-11-23 MED ORDER — PREDNISONE 50 MG PO TABS: 50.0000 mg | ORAL_TABLET | Freq: Every day | ORAL | 0 refills | Status: AC

## 2017-11-23 MED ORDER — DICLOFENAC SODIUM 75 MG PO TBEC: 75.0000 mg | DELAYED_RELEASE_TABLET | Freq: Two times a day (BID) | ORAL | 0 refills | Status: DC

## 2017-11-23 NOTE — Discharge Instructions (Signed)
Use anti-inflammatories for pain/swelling. You may take up to 800 mg Ibuprofen every 8 hours with food. You may supplement Ibuprofen with Tylenol [phone] mg every 8 hours OR Naproxen OR Diclofenac  Prednisone daily for 5 days  Ace wrap for compression to help with swelling.   Follow up with orthopedics

## 2017-11-23 NOTE — ED Provider Notes (Signed)
[facility]    CSN: [id number] Arrival date & time: 11/23/17  1314     History   Chief Complaint Chief Complaint  Patient presents with  . Knee Pain    right    HPI William Davila is a 52 y.o. male history of her of osteoarthritis, CHF, hypertension, gout, obesity presenting today for evaluation of right knee pain.  He denies any injury.  Patient states that on Monday he began to notice pain in his right knee.  He has been using crutches as his pain is mainly with weightbearing.  Denies pain at rest.  Also feels like he has fluid on his knee.  Is previously seen orthopedics and had injections in bilateral knees.  He has been taking naproxen which has had mild relief.  When symptoms began he was also having right ankle pain, took colchicine for his gout which improved ankle pain.  He states that she typically does not get gout in his knee.  HPI  Past Medical History:  Diagnosis Date  . Arthritis    knee's  . Cardiomyopathy (HCC)   . Chronic systolic heart failure (HCC)    Diagnosed 2001; prior patient of Dr. H. Smith with [facility] - discharged from practice  . Dilated cardiomyopathy (HCC)    LVEF 10-20%  . Essential hypertension, benign   . Gout   . Hyperlipidemia   . Hypertension   . 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 Morbid obesity (HCC)   . Presence of combination internal cardiac defibrillator (ICD) and pacemaker   . VT (ventricular tachycardia) (HCC) 06/07/2011    Patient Active Problem List   Diagnosis Date Noted  . Arthritis 07/12/2016  . Thrombocytosis (HCC) 09/11/2014  . Diabetes mellitus type 2 in obese (HCC) 07/31/2014  . Health care maintenance 07/24/2014  . Leukocytosis 06/05/2014  . Normocytic anemia 06/05/2014  . Gout 05/06/2014  . 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 Implantable defibrillator-Medtronic atrial and LV port plugged 01/27/2012  . Knee pain, right 06/17/2011  . VT (ventricular tachycardia) (HCC)  06/07/2011  . Chronic combined systolic and diastolic congestive heart failure (HCC) 05/02/2011  . Quadriceps muscle rupture 02/02/2011  . Knee pain, left 12/15/2010  . Elevated uric acid in blood 08/14/2010  . HYPERLIPIDEMIA 07/11/2009  . Morbid obesity (HCC) 05/29/2008  . CARDIOMYOPATHY, DILATED 05/29/2008  . Essential hypertension 05/29/2008    Past Surgical History:  Procedure Laterality Date  . ICD  10/27/2011  . IMPLANTABLE CARDIOVERTER DEFIBRILLATOR IMPLANT N/A 10/27/2011   Procedure: IMPLANTABLE CARDIOVERTER DEFIBRILLATOR IMPLANT;  Surgeon: Steven C Klein, MD;  Location: [facility] CATH LAB;  Service: Cardiovascular;  Laterality: N/A;       Home Medications    Prior to Admission medications   Medication Sig Start Date End Date Taking? Authorizing Provider  carvedilol (COREG) 25 MG tablet Take 1 tablet (25 mg total) by mouth 2 (two) times daily. 09/23/17  Yes McLean, Dalton S, MD  colchicine 0.6 MG tablet Take 1 tablet by mouth daily. 02/10/16  Yes [provider]  digoxin (DIGOX) 0.125 MG tablet Take 1 tablet (125 mcg total) by mouth daily. Please call for OV [phone] 10/19/17  Yes Bensimhon, Daniel R, MD  potassium chloride SA (K-DUR,KLOR-CON) 20 MEQ tablet Take 1 tablet (20 mEq total) by mouth 2 (two) times daily. 11/14/17  Yes McLean, Dalton S, MD  sacubitril-valsartan (ENTRESTO) 97-103 MG Take 1 tablet by mouth 2 (two) times daily. 08/29/17  Yes Bensimhon, Daniel R, MD    spironolactone (ALDACTONE) 25 MG tablet Take 1 tablet (25 mg total) by mouth daily. 06/30/17  Yes Bensimhon, Daniel R, MD  torsemide (DEMADEX) 20 MG tablet Take 3 tablets (60 mg total) by mouth daily. 10/12/17  Yes Bensimhon, Daniel R, MD  acetaminophen (TYLENOL) 500 MG tablet Take 1,000 mg by mouth every 6 (six) hours as needed. For pain    [provider]  diclofenac (VOLTAREN) 75 MG EC tablet Take 1 tablet (75 mg total) by mouth 2 (two) times daily. 11/23/17   Wieters, Hallie C, PA-C  Na Sulfate-K  Sulfate-Mg Sulf (SUPREP BOWEL PREP KIT) 17.5-3.13-1.6 GM/177ML SOLN Take 1 kit by mouth as directed. 11/18/17   Armbruster, Steven P, MD  naproxen sodium (ANAPROX DS) 550 MG tablet Take 1 tablet (550 mg total) by mouth 2 (two) times daily with a meal. 02/06/16   Patrick, Frank C, PA  predniSONE (DELTASONE) 50 MG tablet Take 1 tablet (50 mg total) by mouth daily for 5 days. 11/23/17 11/28/17  Wieters, Hallie C, PA-C    Family History Family History  Problem Relation Age of Onset  . Hypertension Mother     Social History Social History   Tobacco Use  . Smoking status: Never Smoker  . Smokeless tobacco: Never Used  Substance Use Topics  . Alcohol use: Yes    Comment: occasionally (1-2 per week)  . Drug use: No     Allergies   Beta adrenergic blockers and Isosorb dinitrate-hydralazine   Review of Systems Review of Systems  Constitutional: Negative for activity change, appetite change and fever.  Eyes: Negative for visual disturbance.  Respiratory: Negative for shortness of breath.   Cardiovascular: Negative for chest pain.  Gastrointestinal: Negative for abdominal pain, nausea and vomiting.  Musculoskeletal: Positive for arthralgias, gait problem, joint swelling and myalgias. Negative for back pain, neck pain and neck stiffness.  Skin: Negative for color change, rash and wound.  Neurological: Negative for dizziness, weakness, light-headedness, numbness and headaches.     Physical Exam Triage Vital Signs ED Triage Vitals  Enc Vitals Group     BP 11/23/17 1348 111/71     Pulse Rate 11/23/17 1348 85     Resp --      Temp 11/23/17 1348 (!) 97.5 F (36.4 C)     Temp Source 11/23/17 1348 Oral     SpO2 11/23/17 1348 93 %     Weight --      Height --      Head Circumference --      Peak Flow --      Pain Score 11/23/17 1347 8     Pain Loc --      Pain Edu? --      Excl. in GC? --    No data found.  Updated Vital Signs BP 111/71 (BP Location: Right Wrist)   Pulse 85    Temp (!) 97.5 F (36.4 C) (Oral)   SpO2 93%   Visual Acuity Right Eye Distance:   Left Eye Distance:   Bilateral Distance:    Right Eye Near:   Left Eye Near:    Bilateral Near:     Physical Exam  Constitutional: He appears well-developed and well-nourished.  HENT:  Head: Normocephalic and atraumatic.  Eyes: Conjunctivae are normal.  Neck: Neck supple.  Cardiovascular: Normal rate and regular rhythm.  No murmur heard. Pulmonary/Chest: Effort normal and breath sounds normal. No respiratory distress.  Abdominal: Soft. There is no tenderness.  Musculoskeletal: He exhibits no edema.    Ambulating slowly with crutches No obvious swelling or deformity compared to left knee, nontender to palpation over patella, medial lateral joint line as well as popliteal area.  Patient has slightly limited active range of motion with flexion extension ranging from approximately 70- 165; no overlying erythema or warmth  Neurological: He is alert.  Skin: Skin is warm and dry.  Psychiatric: He has a normal mood and affect.  Nursing note and vitals reviewed.    UC Treatments / Results  Labs (all labs ordered are listed, but only abnormal results are displayed) Labs Reviewed - No data to display  EKG None  Radiology No results found.  Procedures Procedures (including critical care time)  Medications Ordered in UC Medications - No data to display  Initial Impression / Assessment and Plan / UC Course  I have reviewed the triage vital signs and the nursing notes.  Pertinent labs & imaging results that were available during my care of the patient were reviewed by me and considered in my medical decision making (see chart for details).     Patient with knee pain, likely inflammatory from osteoarthritis.  Does not appear to be septic given still able to move the knee, no overlying redness or warmth.  Possible gout.  At this time will defer imaging giving no injury.  Will treat conservatively  with anti-inflammatories, prednisone for 5 days, rest, ice, compression.Discussed strict return precautions. Patient verbalized understanding and is agreeable with plan.  Final Clinical Impressions(s) / UC Diagnoses   Final diagnoses:  Acute pain of right knee     Discharge Instructions     Use anti-inflammatories for pain/swelling. You may take up to 800 mg Ibuprofen every 8 hours with food. You may supplement Ibuprofen with Tylenol 500-1000 mg every 8 hours OR Naproxen OR Diclofenac  Prednisone daily for 5 days  Ace wrap for compression to help with swelling.   Follow up with orthopedics   ED Prescriptions    Medication Sig Dispense Auth. Provider   predniSONE (DELTASONE) 50 MG tablet Take 1 tablet (50 mg total) by mouth daily for 5 days. 5 tablet Wieters, Hallie C, PA-C   diclofenac (VOLTAREN) 75 MG EC tablet Take 1 tablet (75 mg total) by mouth 2 (two) times daily. 30 tablet Wieters, Hallie C, PA-C     Controlled Substance Prescriptions [place] Controlled Substance Registry consulted? Not Applicable   Wieters, Hallie C, PA-C 11/23/17 1435  

## 2017-11-23 NOTE — ED Triage Notes (Signed)
Pt reports waking up Monday morning with right knee pain.  Pt denies any injury to the knee.  Pt arrived using crutches.

## 2017-12-12 ENCOUNTER — Other Ambulatory Visit (HOSPITAL_COMMUNITY): Payer: Self-pay | Admitting: Internal Medicine

## 2017-12-14 ENCOUNTER — Ambulatory Visit (INDEPENDENT_AMBULATORY_CARE_PROVIDER_SITE_OTHER): Payer: Medicare Other | Admitting: *Deleted

## 2017-12-14 DIAGNOSIS — I472 Ventricular tachycardia, unspecified: Secondary | ICD-10-CM

## 2017-12-14 NOTE — Progress Notes (Signed)
Remote ICD transmission.   

## 2017-12-22 ENCOUNTER — Encounter (HOSPITAL_COMMUNITY): Payer: Self-pay

## 2017-12-22 ENCOUNTER — Other Ambulatory Visit: Payer: Self-pay

## 2017-12-26 ENCOUNTER — Telehealth: Payer: Self-pay | Admitting: Internal Medicine

## 2017-12-26 NOTE — Telephone Encounter (Signed)
New Message   perioperative prescription implanted cardiac device programming

## 2017-12-27 ENCOUNTER — Ambulatory Visit (HOSPITAL_COMMUNITY)
Admission: RE | Admit: 2017-12-27 | Discharge: 2017-12-27 | Disposition: A | Discharge status: Home / Self Care | Payer: Medicare Other | Source: Ambulatory Visit | Attending: Gastroenterology | Admitting: Gastroenterology

## 2017-12-27 ENCOUNTER — Ambulatory Visit (HOSPITAL_COMMUNITY): Payer: Medicare Other | Admitting: Certified Registered Nurse Anesthetist

## 2017-12-27 ENCOUNTER — Other Ambulatory Visit: Payer: Self-pay

## 2017-12-27 ENCOUNTER — Encounter (HOSPITAL_COMMUNITY)
Admission: RE | Disposition: A | Discharge status: Home / Self Care | Payer: Self-pay | Source: Ambulatory Visit | Attending: Gastroenterology

## 2017-12-27 ENCOUNTER — Encounter (HOSPITAL_COMMUNITY): Payer: Self-pay | Admitting: Certified Registered Nurse Anesthetist

## 2017-12-27 DIAGNOSIS — I420 Dilated cardiomyopathy: Secondary | ICD-10-CM | POA: Diagnosis not present

## 2017-12-27 DIAGNOSIS — M109 Gout, unspecified: Secondary | ICD-10-CM | POA: Diagnosis not present

## 2017-12-27 DIAGNOSIS — Z95810 Presence of automatic (implantable) cardiac defibrillator: Secondary | ICD-10-CM | POA: Diagnosis not present

## 2017-12-27 DIAGNOSIS — E785 Hyperlipidemia, unspecified: Secondary | ICD-10-CM | POA: Diagnosis not present

## 2017-12-27 DIAGNOSIS — M170 Bilateral primary osteoarthritis of knee: Secondary | ICD-10-CM | POA: Diagnosis not present

## 2017-12-27 DIAGNOSIS — K5730 Diverticulosis of large intestine without perforation or abscess without bleeding: Secondary | ICD-10-CM | POA: Insufficient documentation

## 2017-12-27 DIAGNOSIS — Z888 Allergy status to other drugs, medicaments and biological substances status: Secondary | ICD-10-CM | POA: Diagnosis not present

## 2017-12-27 DIAGNOSIS — D120 Benign neoplasm of cecum: Secondary | ICD-10-CM | POA: Insufficient documentation

## 2017-12-27 DIAGNOSIS — D125 Benign neoplasm of sigmoid colon: Secondary | ICD-10-CM | POA: Diagnosis not present

## 2017-12-27 DIAGNOSIS — Z6841 Body Mass Index (BMI) 40.0 and over, adult: Secondary | ICD-10-CM | POA: Diagnosis not present

## 2017-12-27 DIAGNOSIS — I340 Nonrheumatic mitral (valve) insufficiency: Secondary | ICD-10-CM | POA: Diagnosis not present

## 2017-12-27 DIAGNOSIS — Z8249 Family history of ischemic heart disease and other diseases of the circulatory system: Secondary | ICD-10-CM | POA: Insufficient documentation

## 2017-12-27 DIAGNOSIS — I472 Ventricular tachycardia: Secondary | ICD-10-CM | POA: Diagnosis not present

## 2017-12-27 DIAGNOSIS — M1990 Unspecified osteoarthritis, unspecified site: Secondary | ICD-10-CM | POA: Diagnosis not present

## 2017-12-27 DIAGNOSIS — E118 Type 2 diabetes mellitus with unspecified complications: Secondary | ICD-10-CM | POA: Diagnosis not present

## 2017-12-27 DIAGNOSIS — K648 Other hemorrhoids: Secondary | ICD-10-CM | POA: Insufficient documentation

## 2017-12-27 DIAGNOSIS — K5520 Angiodysplasia of colon without hemorrhage: Secondary | ICD-10-CM | POA: Diagnosis not present

## 2017-12-27 DIAGNOSIS — D649 Anemia, unspecified: Secondary | ICD-10-CM | POA: Diagnosis not present

## 2017-12-27 DIAGNOSIS — Z1211 Encounter for screening for malignant neoplasm of colon: Secondary | ICD-10-CM

## 2017-12-27 DIAGNOSIS — D123 Benign neoplasm of transverse colon: Secondary | ICD-10-CM | POA: Diagnosis not present

## 2017-12-27 DIAGNOSIS — Z79899 Other long term (current) drug therapy: Secondary | ICD-10-CM | POA: Diagnosis not present

## 2017-12-27 DIAGNOSIS — I5022 Chronic systolic (congestive) heart failure: Secondary | ICD-10-CM | POA: Diagnosis not present

## 2017-12-27 DIAGNOSIS — I110 Hypertensive heart disease with heart failure: Secondary | ICD-10-CM | POA: Diagnosis not present

## 2017-12-27 DIAGNOSIS — K635 Polyp of colon: Secondary | ICD-10-CM

## 2017-12-27 DIAGNOSIS — D126 Benign neoplasm of colon, unspecified: Secondary | ICD-10-CM

## 2017-12-27 HISTORY — DX: Presence of automatic (implantable) cardiac defibrillator: Z95.810

## 2017-12-27 HISTORY — DX: Presence of cardiac pacemaker: Z95.0

## 2017-12-27 HISTORY — PX: COLONOSCOPY WITH PROPOFOL: SHX5780

## 2017-12-27 HISTORY — PX: BIOPSY: SHX5522

## 2017-12-27 HISTORY — PX: POLYPECTOMY: SHX5525

## 2017-12-27 SURGERY — COLONOSCOPY WITH PROPOFOL
Anesthesia: Monitor Anesthesia Care

## 2017-12-27 MED ORDER — PROPOFOL 10 MG/ML IV BOLUS
INTRAVENOUS | Status: AC
  Filled 2017-12-27: qty 40

## 2017-12-27 MED ORDER — ONDANSETRON HCL 4 MG/2ML IJ SOLN
INTRAMUSCULAR | Status: DC | PRN
  Administered 2017-12-27: 4 mg via INTRAVENOUS

## 2017-12-27 MED ORDER — LACTATED RINGERS IV SOLN
INTRAVENOUS | Status: DC
  Administered 2017-12-27: 1000 mL via INTRAVENOUS

## 2017-12-27 MED ORDER — SODIUM CHLORIDE 0.9 % IV SOLN: INTRAVENOUS | Status: DC

## 2017-12-27 MED ORDER — MIDAZOLAM HCL 2 MG/2ML IJ SOLN
INTRAMUSCULAR | Status: AC
  Filled 2017-12-27: qty 2

## 2017-12-27 MED ORDER — PROPOFOL 500 MG/50ML IV EMUL
INTRAVENOUS | Status: DC | PRN
  Administered 2017-12-27: 100 ug/kg/min via INTRAVENOUS

## 2017-12-27 MED ORDER — PROPOFOL 10 MG/ML IV BOLUS
INTRAVENOUS | Status: DC | PRN
  Administered 2017-12-27 (×2): 20 mg via INTRAVENOUS

## 2017-12-27 MED ORDER — EPHEDRINE SULFATE-NACL 50-0.9 MG/10ML-% IV SOSY
PREFILLED_SYRINGE | INTRAVENOUS | Status: DC | PRN
  Administered 2017-12-27 (×4): 5 mg via INTRAVENOUS

## 2017-12-27 MED ORDER — MIDAZOLAM HCL 2 MG/2ML IJ SOLN
INTRAMUSCULAR | Status: DC | PRN
  Administered 2017-12-27: 2 mg via INTRAVENOUS

## 2017-12-27 SURGICAL SUPPLY — 21 items

## 2017-12-27 NOTE — Anesthesia Postprocedure Evaluation (Signed)
Anesthesia Post Note  Patient: William Davila  Procedure(s) Performed: COLONOSCOPY WITH PROPOFOL (N/A ) POLYPECTOMY BIOPSY     Patient location during evaluation: PACU Anesthesia Type: MAC Level of consciousness: awake and alert Pain management: pain level controlled Vital Signs Assessment: post-procedure vital signs reviewed and stable Respiratory status: spontaneous breathing Cardiovascular status: stable Anesthetic complications: no    Last Vitals:  Vitals:   12/27/17 1010 12/27/17 1020  BP: (!) 92/47 (!) 95/55  Pulse: 77 77  Resp: 17 19  Temp:    SpO2: 98% 97%    Last Pain:  Vitals:   12/27/17 1020  TempSrc:   PainSc: 0-No pain                 Nolon Nations

## 2017-12-27 NOTE — Discharge Instructions (Signed)

## 2017-12-27 NOTE — Op Note (Signed)
[facility] Patient Name: William Davila Procedure Date: 12/27/2017 MRN: [id number] Attending MD: Carlota Raspberry. Havery Moros , MD Date of Birth: Dec 23, 1965 CSN: [id number] Age: 52 Admit Type: Outpatient Procedure:                Colonoscopy Indications:              Screening for colorectal malignant neoplasm, This                            is the patient's first colonoscopy Providers:                Carlota Raspberry. Havery Moros, MD, Carmie End, RN, Tinnie Gens, Technician Referring MD:              Medicines:                Monitored Anesthesia Care Complications:            No immediate complications. Estimated blood loss:                            Minimal. Estimated Blood Loss:     Estimated blood loss was minimal. Procedure:                Pre-Anesthesia Assessment:                           - Prior to the procedure, a History and Physical                            was performed, and patient medications and                            allergies were reviewed. The patient's tolerance of                            previous anesthesia was also reviewed. The risks                            and benefits of the procedure and the sedation                            options and risks were discussed with the patient.                            All questions were answered, and informed consent                            was obtained. Prior Anticoagulants: The patient has                            taken no previous anticoagulant or antiplatelet                            agents. ASA Grade  Assessment: III - A patient with                            severe systemic disease. After reviewing the risks                            and benefits, the patient was deemed in                            satisfactory condition to undergo the procedure.                           After obtaining informed consent, the colonoscope                            was passed under  direct vision. Throughout the                            procedure, the patient's blood pressure, pulse, and                            oxygen saturations were monitored continuously. The                            EC-[id number] ([id number]) scope was introduced through                            the anus and advanced to the the terminal ileum,                            with identification of the appendiceal orifice and                            IC valve. The colonoscopy was performed without                            difficulty. The patient tolerated the procedure                            well. The quality of the bowel preparation was                            good. The terminal ileum, ileocecal valve,                            appendiceal orifice, and rectum were photographed. Scope In: 9:30:16 AM Scope Out: 9:52:26 AM Scope Withdrawal Time: 0 hours 11 minutes 50 seconds  Total Procedure Duration: 0 hours 22 minutes 10 seconds  Findings:      The perianal and digital rectal examinations were normal.      The terminal ileum appeared normal.      A single small angiodysplastic lesion was found in the cecum.      A diminutive polyp was found in the ileocecal valve. The polyp was  sessile. The polyp could represent normal ileal tissue but was removed       with a cold biopsy forceps. Resection and retrieval were complete.      A 10 to 12 mm polyp was found in the hepatic flexure. The polyp was       pedunculated. The polyp was removed with a hot snare. Resection and       retrieval were complete.      A 4 mm polyp was found in the transverse colon. The polyp was sessile.       The polyp was removed with a cold snare. Resection and retrieval were       complete.      A 5 mm polyp was found in the sigmoid colon. The polyp was sessile. The       polyp was removed with a cold snare. Resection and retrieval were       complete.      A few small-mouthed diverticula were found in the left  colon.      Internal hemorrhoids were found during retroflexion. The hemorrhoids       were small.      The exam was otherwise without abnormality. Impression:               - The examined portion of the ileum was normal.                           - A single colonic angiodysplastic lesion.                           - One diminutive polyp at the ileocecal valve,                            removed with a cold biopsy forceps. Resected and                            retrieved.                           - One 10 to 12 mm polyp at the hepatic flexure,                            removed with a hot snare. Resected and retrieved.                           - One 4 mm polyp in the transverse colon, removed                            with a cold snare. Resected and retrieved.                           - One 5 mm polyp in the sigmoid colon, removed with                            a cold snare. Resected and retrieved.                           -  Diverticulosis in the left colon.                           - Internal hemorrhoids.                           - The examination was otherwise normal. Moderate Sedation:      No moderate sedation, case performed with MAC Recommendation:           - Patient has a contact number available for                            emergencies. The signs and symptoms of potential                            delayed complications were discussed with the                            patient. Return to normal activities tomorrow.                            Written discharge instructions were provided to the                            patient.                           - Resume previous diet.                           - Continue present medications.                           - Await pathology results.                           - Repeat colonoscopy for surveillance based on                            pathology results.                           - No ibuprofen, naproxen, or other  non-steroidal                            anti-inflammatory drugs for 2 weeks after polyp                            removal. Procedure Code(s):        --- Professional ---                           [phone], Colonoscopy, flexible; with removal of                            tumor(s), polyp(s), or other lesion(s) by snare  technique                           [id number], 59, Colonoscopy, flexible; with biopsy,                            single or multiple Diagnosis Code(s):        --- Professional ---                           Z12.11, Encounter for screening for malignant                            neoplasm of colon                           D12.0, Benign neoplasm of cecum                           D12.3, Benign neoplasm of transverse colon (hepatic                            flexure or splenic flexure)                           D12.5, Benign neoplasm of sigmoid colon                           K64.8, Other hemorrhoids                           K57.30, Diverticulosis of large intestine without                            perforation or abscess without bleeding CPT copyright 2017 American Medical Association. All rights reserved. The codes documented in this report are preliminary and upon coder review may  be revised to meet current compliance requirements. Remo Lipps P. Kayla Deshaies, MD 12/27/2017 10:00:05 AM This report has been signed electronically. Number of Addenda: 0

## 2017-12-27 NOTE — Interval H&P Note (Signed)
History and Physical Interval Note:  12/27/2017 9:06 AM  William Davila  has presented today for surgery, with the diagnosis of screening colon, bmi <50/EF/db  The various methods of treatment have been discussed with the patient and family. After consideration of risks, benefits and other options for treatment, the patient has consented to  Procedure(s): COLONOSCOPY WITH PROPOFOL (N/A) as a surgical intervention .  The patient's history has been reviewed, patient examined, no change in status, stable for surgery.  I have reviewed the patient's chart and labs.  Questions were answered to the patient's satisfaction.     [place]

## 2017-12-27 NOTE — H&P (Signed)
HPI:   William Davila is a 52 y.o. male with history of CHF, history of VT, ICD in place, morbid obesity, here for first time screening colonoscopy. He denies any symptoms today which are bothering him. No complaints.   Past Medical History:  Diagnosis Date  . AICD (automatic cardioverter/defibrillator) present   . Arthritis    knee's  . Cardiomyopathy ([place])   . Chronic systolic heart failure ([place])    Diagnosed 2001; prior patient of Dr. Linard Millers with [name] - discharged from practice  . Dilated cardiomyopathy ([facility])    LVEF 10-20%  . Essential hypertension, benign   . Gout   . Hyperlipidemia   . Hypertension   . 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 Morbid obesity ([place])   . Presence of combination internal cardiac defibrillator (ICD) and pacemaker   . Presence of permanent cardiac pacemaker   . VT (ventricular tachycardia) ([place]) 06/07/2011    Past Surgical History:  Procedure Laterality Date  . ICD  10/27/2011  . IMPLANTABLE CARDIOVERTER DEFIBRILLATOR IMPLANT N/A 10/27/2011   Procedure: IMPLANTABLE CARDIOVERTER DEFIBRILLATOR IMPLANT;  Surgeon: Deboraha Sprang, MD;  Location[facility] C[facility] LAB;  Service: Cardiovascular;  Laterality: N/A;    Family History  Problem Relation Age of Onset  . Hypertension Mother      Social History   Tobacco Use  . Smoking status: Never Smoker  . Smokeless tobacco: Never Used  Substance Use Topics  . Alcohol use: Yes    Comment: occasionally (1-2 per week)  . Drug use: No    Prior to Admission medications   Medication Sig Start Date End Date Taking? Authorizing Provider  acetaminophen (TYLENOL) 500 MG tablet Take 1,000 mg by mouth every 6 (six) hours as needed. For pain   Yes [provider]  allopurinol (ZYLOPRIM) 100 MG tablet Take 100 mg by mouth daily. 11/16/17  Yes [provider]  carvedilol (COREG) 25 MG tablet Take 1 tablet (25 mg total) by mouth 2 (two) times daily. 09/23/17  Yes  Larey Dresser, MD  colchicine 0.6 MG tablet Take 1 tablet by mouth daily. 02/10/16  Yes [provider]  diclofenac (VOLTAREN) 75 MG EC tablet Take 1 tablet (75 mg total) by mouth 2 (two) times daily. Patient taking differently: Take 75 mg by mouth as needed for mild pain.  11/23/17  Yes Wieters, Hallie C, PA-C  digoxin (DIGOX) 0.125 MG tablet Take 1 tablet (125 mcg total) by mouth daily. Please call for OV [phone] 10/19/17  Yes Bensimhon, Shaune Pascal, MD  Na Sulfate-K Sulfate-Mg Sulf (SUPREP BOWEL PREP KIT) 17.5-3.13-1.6 GM/177ML SOLN Take 1 kit by mouth as directed. 11/18/17  Yes Jabes Primo, Carlota Raspberry, MD  naproxen sodium (ANAPROX DS) 550 MG tablet Take 1 tablet (550 mg total) by mouth 2 (two) times daily with a meal. 02/06/16  Yes Konrad Felix, PA  potassium chloride SA (K-DUR,KLOR-CON) 20 MEQ tablet Take 1 tablet (20 mEq total) by mouth 2 (two) times daily. 11/14/17  Yes Larey Dresser, MD  sacubitril-valsartan (ENTRESTO) 97-103 MG Take 1 tablet by mouth 2 (two) times daily. 08/29/17  Yes Bensimhon, Shaune Pascal, MD  spironolactone (ALDACTONE) 25 MG tablet Take 1 tablet (25 mg total) by mouth daily. 06/30/17  Yes Bensimhon, Shaune Pascal, MD  torsemide (DEMADEX) 20 MG tablet TAKE 3 TABLETS BY MOUTH ONCE DAILY 11/24/17  Yes Bensimhon, Shaune Pascal, MD  Current Facility-Administered Medications  Medication Dose Route Frequency Provider Last Rate Last Dose  . 0.9 %  sodium chloride infusion   Intravenous Continuous Jadis Mika, Carlota Raspberry, MD      . lactated ringers infusion   Intravenous Continuous Mithra Spano, Carlota Raspberry, MD        Allergies as of 11/18/2017 - Review Complete 11/18/2017  Allergen Reaction Noted  . Beta adrenergic blockers Other (See Comments) 06/05/2008  . Isosorb dinitrate-hydralazine Other (See Comments) 06/05/2008     Review of Systems:    As per HPI, otherwise negative    Physical Exam:  Vital signs in last 24 hours:     General:   Pleasant male in NAD Lungs:   Respirations even and unlabored. Lungs clear to auscultation bilaterally.    Heart:  Regular rate and rhythm; no MRG Abdomen:  Soft, protuberant, nontender. No appreciable masses or hepatomegaly.  Neurologic:  Alert and  oriented x4;  grossly normal neurologically.Marland Kitchen Psych:  Alert and cooperative. Normal affect.  Lab Results  Component Value Date   WBC 13.7 (H) 07/23/2014   HGB 12.6 (L) 07/23/2014   HCT 37.2 (L) 07/23/2014   MCV 84.5 07/23/2014   PLT 518 (H) 07/23/2014    Lab Results  Component Value Date   CREATININE 1.16 04/04/2017   BUN 13 04/04/2017   NA 137 04/04/2017   K 3.9 04/04/2017   CL 103 04/04/2017   CO2 27 04/04/2017     Impression / Plan:  52 y.o. male with history of CHF, history of VT, ICD in place, morbid obesity, here for first time screening colonoscopy. I have discussed options for screening with him previously, stool based versus colonoscopy. He has preferred colonoscopy. We have discussed risks / benefits of anesthesia, colonoscopy, with him, and he wants to proceed. Further recommendations pending the results.  Acampo Cellar, MD [facility]

## 2017-12-27 NOTE — Anesthesia Preprocedure Evaluation (Addendum)
Anesthesia Evaluation  Patient identified by MRN, date of birth, ID band Patient awake    Reviewed: Allergy & Precautions, NPO status , Patient's Chart, lab work & pertinent test results  Airway Mallampati: II  TM Distance: >3 FB Neck ROM: Full    Dental no notable dental hx.    Pulmonary neg pulmonary ROS,    Pulmonary exam normal breath sounds clear to auscultation       Cardiovascular hypertension, +CHF  Normal cardiovascular exam+ pacemaker + Cardiac Defibrillator  Rhythm:Regular Rate:Normal  Echo 06/2016 - Left ventricle: The cavity size was severely dilated. Wall   thickness was normal. Systolic function was severely reduced. The estimated ejection fraction was in the range of 20% to 25%. Diffuse hypokinesis. Doppler parameters are consistent with restrictive physiology, indicative of decreased left ventricular diastolic compliance and/or increased left atrial pressure. - Mitral valve: There was mild regurgitation. - Left atrium: The atrium was moderately dilated.  Impressions: - Severe global reduction in LV systolic function; restrictive   filling; severe LVE; mild MR; moderate LAE; mild TR.   Neuro/Psych  Neuromuscular disease negative psych ROS   GI/Hepatic negative GI ROS, Neg liver ROS,   Endo/Other  diabetes  Renal/GU negative Renal ROS     Musculoskeletal  (+) Arthritis ,   Abdominal   Peds  Hematology negative hematology ROS (+) anemia ,   Anesthesia Other Findings   Reproductive/Obstetrics negative OB ROS                            Anesthesia Physical Anesthesia Plan  ASA: IV  Anesthesia Plan: MAC   Post-op Pain Management:    Induction: Intravenous  PONV Risk Score and Plan: Propofol infusion, TIVA and Midazolam  Airway Management Planned:   Additional Equipment:   Intra-op Plan:   Post-operative Plan:   Informed Consent: I have reviewed the patients  History and Physical, chart, labs and discussed the procedure including the risks, benefits and alternatives for the proposed anesthesia with the patient or authorized representative who has indicated his/her understanding and acceptance.   Dental advisory given  Plan Discussed with: CRNA  Anesthesia Plan Comments:         Anesthesia Quick Evaluation

## 2017-12-27 NOTE — Transfer of Care (Signed)
Immediate Anesthesia Transfer of Care Note  Patient: William Davila  Procedure(s) Performed: COLONOSCOPY WITH PROPOFOL (N/A ) POLYPECTOMY  Patient Location: PACU and Endoscopy Unit  Anesthesia Type:MAC  Level of Consciousness: drowsy and patient cooperative  Airway & Oxygen Therapy: Patient Spontanous Breathing and Patient connected to face mask  Post-op Assessment: Report given to RN and Post -op Vital signs reviewed and stable  Post vital signs: Reviewed and stable  Last Vitals:  Vitals Value Taken Time  BP 95/57 12/27/2017 10:00 AM  Temp    Pulse 77 12/27/2017 10:02 AM  Resp 23 12/27/2017 10:02 AM  SpO2 100 % 12/27/2017 10:02 AM  Vitals shown include unvalidated device data.  Last Pain:  Vitals:   12/27/17 0908  TempSrc: Oral  PainSc: 5       Patients Stated Pain Goal: 0 (53/66/44 0347)  Complications: No apparent anesthesia complications

## 2017-12-29 ENCOUNTER — Encounter: Payer: Self-pay | Admitting: Gastroenterology

## 2018-01-04 LAB — CUP PACEART REMOTE DEVICE CHECK
Brady Statistic AP VS Percent: 0 %
Brady Statistic AS VP Percent: 0 %
Brady Statistic RA Percent Paced: 0 %
Brady Statistic RV Percent Paced: 0.01 %
Date Time Interrogation Session: 20190703073628
HIGH POWER IMPEDANCE MEASURED VALUE: 323 Ohm
HighPow Impedance: 54 Ohm
HighPow Impedance: 73 Ohm
Implantable Lead Implant Date: 20130515
Implantable Lead Location: 753860
Lead Channel Impedance Value: 4047 Ohm
Lead Channel Impedance Value: 4047 Ohm
Lead Channel Impedance Value: 665 Ohm
Lead Channel Pacing Threshold Amplitude: 0.875 V
Lead Channel Sensing Intrinsic Amplitude: 0.5 mV
Lead Channel Sensing Intrinsic Amplitude: 9.125 mV
Lead Channel Setting Pacing Amplitude: 2.5 V
MDC IDC MSMT BATTERY VOLTAGE: 2.92 V
MDC IDC MSMT LEADCHNL LV IMPEDANCE VALUE: 4047 Ohm
MDC IDC MSMT LEADCHNL RA IMPEDANCE VALUE: 4047 Ohm
MDC IDC MSMT LEADCHNL RA SENSING INTR AMPL: 0.5 mV
MDC IDC MSMT LEADCHNL RV PACING THRESHOLD PULSEWIDTH: 0.4 ms
MDC IDC MSMT LEADCHNL RV SENSING INTR AMPL: 9.125 mV
MDC IDC PG IMPLANT DT: 20130515
MDC IDC SET LEADCHNL RV PACING PULSEWIDTH: 0.4 ms
MDC IDC SET LEADCHNL RV SENSING SENSITIVITY: 0.3 mV
MDC IDC STAT BRADY AP VP PERCENT: 0 %
MDC IDC STAT BRADY AS VS PERCENT: 100 %

## 2018-01-10 ENCOUNTER — Other Ambulatory Visit (HOSPITAL_COMMUNITY): Payer: Self-pay | Admitting: Internal Medicine

## 2018-02-10 ENCOUNTER — Other Ambulatory Visit (HOSPITAL_COMMUNITY): Payer: Self-pay | Admitting: Internal Medicine

## 2018-02-17 ENCOUNTER — Other Ambulatory Visit (HOSPITAL_COMMUNITY): Payer: Self-pay | Admitting: Internal Medicine

## 2018-02-17 MED ORDER — POTASSIUM CHLORIDE CRYS ER 20 MEQ PO TBCR: 20.0000 meq | EXTENDED_RELEASE_TABLET | Freq: Two times a day (BID) | ORAL | 3 refills | Status: DC

## 2018-03-12 ENCOUNTER — Other Ambulatory Visit (HOSPITAL_COMMUNITY): Payer: Self-pay | Admitting: Internal Medicine

## 2018-03-15 ENCOUNTER — Ambulatory Visit (INDEPENDENT_AMBULATORY_CARE_PROVIDER_SITE_OTHER): Payer: Medicare Other | Admitting: *Deleted

## 2018-03-15 DIAGNOSIS — I472 Ventricular tachycardia, unspecified: Secondary | ICD-10-CM

## 2018-03-15 DIAGNOSIS — I428 Other cardiomyopathies: Secondary | ICD-10-CM

## 2018-03-15 NOTE — Progress Notes (Signed)
Remote ICD transmission.   

## 2018-03-25 LAB — CUP PACEART REMOTE DEVICE CHECK
Brady Statistic AP VS Percent: 0 %
Brady Statistic RA Percent Paced: 0 %
HIGH POWER IMPEDANCE MEASURED VALUE: 323 Ohm
HIGH POWER IMPEDANCE MEASURED VALUE: 67 Ohm
HighPow Impedance: 89 Ohm
Lead Channel Impedance Value: 4047 Ohm
Lead Channel Impedance Value: 4047 Ohm
Lead Channel Impedance Value: 4047 Ohm
Lead Channel Impedance Value: 836 Ohm
Lead Channel Pacing Threshold Amplitude: 0.75 V
Lead Channel Sensing Intrinsic Amplitude: 11.5 mV
MDC IDC LEAD IMPLANT DT: 20130515
MDC IDC LEAD LOCATION: 753860
MDC IDC MSMT BATTERY VOLTAGE: 2.9 V
MDC IDC MSMT LEADCHNL LV IMPEDANCE VALUE: 4047 Ohm
MDC IDC MSMT LEADCHNL RA SENSING INTR AMPL: 0.5 mV
MDC IDC MSMT LEADCHNL RA SENSING INTR AMPL: 0.5 mV
MDC IDC MSMT LEADCHNL RV PACING THRESHOLD PULSEWIDTH: 0.4 ms
MDC IDC MSMT LEADCHNL RV SENSING INTR AMPL: 11.5 mV
MDC IDC PG IMPLANT DT: 20130515
MDC IDC SESS DTM: 20191002041808
MDC IDC SET LEADCHNL RV PACING AMPLITUDE: 2.5 V
MDC IDC SET LEADCHNL RV PACING PULSEWIDTH: 0.4 ms
MDC IDC SET LEADCHNL RV SENSING SENSITIVITY: 0.3 mV
MDC IDC STAT BRADY AP VP PERCENT: 0 %
MDC IDC STAT BRADY AS VP PERCENT: 0 %
MDC IDC STAT BRADY AS VS PERCENT: 100 %
MDC IDC STAT BRADY RV PERCENT PACED: 0 %

## 2018-05-12 ENCOUNTER — Other Ambulatory Visit (HOSPITAL_COMMUNITY): Payer: Self-pay | Admitting: Internal Medicine

## 2018-05-22 ENCOUNTER — Other Ambulatory Visit (HOSPITAL_COMMUNITY): Payer: Self-pay

## 2018-05-22 MED ORDER — TORSEMIDE 20 MG PO TABS: 60.0000 mg | ORAL_TABLET | Freq: Every day | ORAL | 0 refills | Status: DC

## 2018-06-10 ENCOUNTER — Other Ambulatory Visit (HOSPITAL_COMMUNITY): Payer: Self-pay | Admitting: Internal Medicine

## 2018-06-12 ENCOUNTER — Other Ambulatory Visit (HOSPITAL_COMMUNITY): Payer: Self-pay | Admitting: Internal Medicine

## 2018-06-15 ENCOUNTER — Ambulatory Visit (INDEPENDENT_AMBULATORY_CARE_PROVIDER_SITE_OTHER): Payer: Medicare Other

## 2018-06-15 DIAGNOSIS — I472 Ventricular tachycardia, unspecified: Secondary | ICD-10-CM

## 2018-06-15 LAB — CUP PACEART REMOTE DEVICE CHECK
Battery Voltage: 2.82 V
Brady Statistic AP VP Percent: 0 %
Brady Statistic RV Percent Paced: 0.01 %
HIGH POWER IMPEDANCE MEASURED VALUE: 64 Ohm
HIGH POWER IMPEDANCE MEASURED VALUE: 88 Ohm
HighPow Impedance: 342 Ohm
Implantable Lead Implant Date: 20130515
Implantable Lead Location: 753860
Implantable Pulse Generator Implant Date: 20130515
Lead Channel Impedance Value: 4047 Ohm
Lead Channel Impedance Value: 4047 Ohm
Lead Channel Impedance Value: 950 Ohm
Lead Channel Pacing Threshold Amplitude: 0.75 V
Lead Channel Pacing Threshold Pulse Width: 0.4 ms
Lead Channel Sensing Intrinsic Amplitude: 11.875 mV
Lead Channel Sensing Intrinsic Amplitude: 11.875 mV
Lead Channel Setting Pacing Pulse Width: 0.4 ms
Lead Channel Setting Sensing Sensitivity: 0.3 mV
MDC IDC MSMT LEADCHNL LV IMPEDANCE VALUE: 4047 Ohm
MDC IDC MSMT LEADCHNL RA IMPEDANCE VALUE: 4047 Ohm
MDC IDC MSMT LEADCHNL RA SENSING INTR AMPL: 0.5 mV
MDC IDC MSMT LEADCHNL RA SENSING INTR AMPL: 0.5 mV
MDC IDC SESS DTM: 20200102062408
MDC IDC SET LEADCHNL RV PACING AMPLITUDE: 2.5 V
MDC IDC STAT BRADY AP VS PERCENT: 0 %
MDC IDC STAT BRADY AS VP PERCENT: 0 %
MDC IDC STAT BRADY AS VS PERCENT: 100 %
MDC IDC STAT BRADY RA PERCENT PACED: 0 %

## 2018-06-15 NOTE — Progress Notes (Signed)
Remote ICD transmission.   

## 2018-07-06 ENCOUNTER — Other Ambulatory Visit (HOSPITAL_COMMUNITY): Payer: Self-pay | Admitting: Internal Medicine

## 2018-07-28 ENCOUNTER — Encounter (HOSPITAL_COMMUNITY): Payer: Self-pay | Admitting: Internal Medicine

## 2018-07-28 ENCOUNTER — Ambulatory Visit (HOSPITAL_COMMUNITY)
Admission: RE | Admit: 2018-07-28 | Discharge: 2018-07-28 | Disposition: A | Discharge status: Home / Self Care | Payer: Medicare Other | Source: Ambulatory Visit | Attending: Internal Medicine | Admitting: Internal Medicine

## 2018-07-28 VITALS — BP 108/76 | HR 93 | Wt 388.6 lb

## 2018-07-28 DIAGNOSIS — M1990 Unspecified osteoarthritis, unspecified site: Secondary | ICD-10-CM | POA: Insufficient documentation

## 2018-07-28 DIAGNOSIS — Z6841 Body Mass Index (BMI) 40.0 and over, adult: Secondary | ICD-10-CM | POA: Insufficient documentation

## 2018-07-28 DIAGNOSIS — M109 Gout, unspecified: Secondary | ICD-10-CM | POA: Insufficient documentation

## 2018-07-28 DIAGNOSIS — Z79899 Other long term (current) drug therapy: Secondary | ICD-10-CM | POA: Insufficient documentation

## 2018-07-28 DIAGNOSIS — Z7901 Long term (current) use of anticoagulants: Secondary | ICD-10-CM | POA: Insufficient documentation

## 2018-07-28 DIAGNOSIS — I10 Essential (primary) hypertension: Secondary | ICD-10-CM

## 2018-07-28 DIAGNOSIS — I5042 Chronic combined systolic (congestive) and diastolic (congestive) heart failure: Secondary | ICD-10-CM | POA: Diagnosis not present

## 2018-07-28 DIAGNOSIS — Z95810 Presence of automatic (implantable) cardiac defibrillator: Secondary | ICD-10-CM | POA: Insufficient documentation

## 2018-07-28 DIAGNOSIS — E785 Hyperlipidemia, unspecified: Secondary | ICD-10-CM | POA: Diagnosis not present

## 2018-07-28 DIAGNOSIS — I110 Hypertensive heart disease with heart failure: Secondary | ICD-10-CM | POA: Diagnosis not present

## 2018-07-28 DIAGNOSIS — I420 Dilated cardiomyopathy: Secondary | ICD-10-CM | POA: Insufficient documentation

## 2018-07-28 LAB — BASIC METABOLIC PANEL
Anion gap: 8 (ref 5–15)
BUN: 14 mg/dL (ref 6–20)
CO2: 26 mmol/L (ref 22–32)
Calcium: 8.9 mg/dL (ref 8.9–10.3)
Chloride: 104 mmol/L (ref 98–111)
Creatinine, Ser: 1.15 mg/dL (ref 0.61–1.24)
GFR calc Af Amer: >60 mL/min (ref >60)
GLUCOSE: 109 mg/dL — AB (ref 70–99)
Potassium: 3.5 mmol/L (ref 3.5–5.1)
Sodium: 138 mmol/L (ref 135–145)

## 2018-07-28 LAB — CBC
HCT: 39.2 % (ref 39.0–52.0)
HEMOGLOBIN: 12.4 g/dL — AB (ref 13.0–17.0)
MCH: 29.5 pg (ref 26.0–34.0)
MCHC: 31.6 g/dL (ref 30.0–36.0)
MCV: 93.3 fL (ref 80.0–100.0)
Platelets: 335 10*3/uL (ref 150–400)
RBC: 4.2 MIL/uL — ABNORMAL LOW (ref 4.22–5.81)
RDW: 14.6 % (ref 11.5–15.5)
WBC: 9.9 10*3/uL (ref 4.0–10.5)
nRBC: 0 % (ref 0.0–0.2)

## 2018-07-28 LAB — DIGOXIN LEVEL: Digoxin Level: 0.4 ng/mL — ABNORMAL LOW (ref 0.8–2.0)

## 2018-07-28 MED ORDER — POTASSIUM CHLORIDE CRYS ER 20 MEQ PO TBCR: 20.0000 meq | EXTENDED_RELEASE_TABLET | Freq: Two times a day (BID) | ORAL | 3 refills | Status: DC

## 2018-07-28 NOTE — Patient Instructions (Signed)
Labs were done today. We will call you with any ABNORMAL results. No news is good news!  Please take an extra Torsemide today, then take as prescribed.  Your Potassium refill has been sent to your pharmacy.  Your physician has requested that you have an echocardiogram. Echocardiography is a painless test that uses sound waves to create images of your heart. It provides your doctor with information about the size and shape of your heart and how well your heart's chambers and valves are working. This procedure takes approximately one hour. There are no restrictions for this procedure. This office will call to schedule this appointment with you.  Your physician recommends that you schedule a follow-up appointment in: 3-4 months with Dr Haroldine Laws.

## 2018-07-28 NOTE — Progress Notes (Signed)
Patient ID: William Davila, male   DOB: 12-14-1965, 53 y.o.   MRN: [id number]     Ortho: Dr Nori Riis  PCP: [facility] EP: Dr Caryl Comes  HF MD: Dr Haroldine Laws  HPI: William Davila is a 53 y.o. male with systolic heart failure , cardiomyopathy diagnosed 2001, hyperlipidemia, HTN, and obesity. S/P [employer] single chamber ICD 10/2011. Intolerant Bidil due to headaches.   Discharged from [facility] 05/10/2011 Discharge weight 328.  Massive volume over load and low output. Cardiac output increased inotropes. Medications initiated ace, beta blocker , digoxin, and torsemide.   He presents today for follow up. Last seen 03/2017. Feeling well overall. Not working currently. His brother has a job and lives with him. Weighed 371 lbs at that visit. Encouraged weight loss. 388 lbs today. Very minimally active, most he does cleaning around the house. Denies DOE, able to walk around the grocery store and [place] without issue. Denies CP, orthopnea, lightheadedness, dizziness or edema. Takes all medications as directed. He states he has never had a heart catheterization.   Optivol: Thoracic impedence below threshold. Fluid index trending up since the beginning of February. No VT/VF. Pt activity 1-2 hrs daily. Personally reviewed.   Echo 08/2011 EF 20-25% Echo 06/20/14 EF 25-30% RV normal  Echo 07/12/16 EF 25-30%  Labs 06/04/14 K 3.6 Creatinine 0.96.  Labs 04/30/2015: 3.0  Labs 05/07/2015 K 3.4 Creatinine   Review of systems complete and found to be negative unless listed in HPI.    Past Medical History:  Diagnosis Date  . AICD (automatic cardioverter/defibrillator) present   . Arthritis    knee's  . Cardiomyopathy ([place])   . Chronic systolic heart failure ([place])    Diagnosed 2001; prior patient of Dr. Linard Millers with [name] - discharged from practice  . Dilated cardiomyopathy ([facility])    LVEF 10-20%  . Essential hypertension, benign   . Gout   . Hyperlipidemia   . Hypertension   . ICD (implantable cardiac  defibrillator) in place 10/27/2011  . Morbid obesity ([place])   . Presence of combination internal cardiac defibrillator (ICD) and pacemaker   . Presence of permanent cardiac pacemaker   . VT (ventricular tachycardia) ([place]) 06/07/2011    Current Outpatient Medications  Medication Sig Dispense Refill  . acetaminophen (TYLENOL) 500 MG tablet Take 1,000 mg by mouth every 6 (six) hours as needed. For pain    . allopurinol (ZYLOPRIM) 100 MG tablet Take 100 mg by mouth daily.  1  . carvedilol (COREG) 25 MG tablet Take 1 tablet (25 mg total) by mouth 2 (two) times daily. 180 tablet 3  . colchicine 0.6 MG tablet Take 1 tablet by mouth daily.    . digoxin (LANOXIN) 0.125 MG tablet TAKE 1 TABLET BY MOUTH DAILY 90 tablet 1  . ENTRESTO 97-103 MG TAKE 1 TABLET BY MOUTH TWICE DAILY 60 tablet 2  . naproxen sodium (ANAPROX DS) 550 MG tablet Take 1 tablet (550 mg total) by mouth 2 (two) times daily with a meal. 30 tablet 0  . potassium chloride SA (K-DUR,KLOR-CON) 20 MEQ tablet Take 1 tablet (20 mEq total) by mouth 2 (two) times daily. 60 tablet 3  . spironolactone (ALDACTONE) 25 MG tablet TAKE 1 TABLET BY MOUTH EVERY DAY 90 tablet 1  . torsemide (DEMADEX) 20 MG tablet Take 3 tablets (60 mg total) by mouth daily. 270 tablet 0  . diclofenac (VOLTAREN) 75 MG EC tablet Take 1 tablet (75 mg total) by mouth 2 (two) times  daily. (Patient not taking: Reported on 07/28/2018) 30 tablet 0   No current facility-administered medications for this encounter.    PHYSICAL EXAM: Vitals:   07/28/18 1009  BP: 108/76  Pulse: 93  SpO2: 98%  Weight: (!) 176.3 kg    Wt Readings from Last 3 Encounters:  07/28/18 (!) 176.3 kg  12/27/17 (!) 165.6 kg  11/18/17 (!) 172.5 kg   General:  Morbidly obese adult male in NAD HEENT: Normal Neck: Thick. JVP difficult due to body habitus. Carotids 2+ bilat; no bruits. No thyromegaly or nodule noted. Cor: PMI nondisplaced. RRR, No M/G/R noted Lungs: CTAB, normal breathing  effort. Abdomen: Soft, non-tender, non-distended, no HSM. No bruits or masses. +BS   Extremities: No LE edema. No clubbing or cyanosis. Neuro: Alert & oriented x 3, cranial nerves grossly intact. moves all 4 extremities w/o difficulty.  Psych: Mood appropriate. Affect pleasant   ASSESSMENT & PLAN: 1. Chronic Systolic heart Failure- Medtronic Single lead ICD5/2013   - Echo 06/2016 LVEF 20-25%. Repeat Echo.  - NYHA II-early III - Volume status difficult on exam but elevated by optivol.  - Continue Torsemide 60 mg daily. Will have take extra 60 mg of Torsemide today. Can repeat as needed. BMET today.  - Continue Carvedilol 25 mg BID - Continue Digoxin 0.125 mg daily. Check level today.  - Continue Entresto 97/103 mg BID.   - Continue spironolactone 25 mg daily.  - Continue potassium 104meQ BID. - Intolerant to Bidil due to severe headaches. - Reinforced fluid restriction to < 2 L daily, sodium restriction to less than 2000 mg daily, and the importance of daily weights.    2.Morbid Obesity - Body mass index is 56.56 kg/m. Weight trending up since last visit. 371--> 388.  - He is minimally active. Encouraged diet and exercise.   3. Arthritis - In hands and bilateral knees.  - Per PCP.   Take extra dose of Torsemide today. BMET, CBC and Digoxin level today. Repeat ECHO. Follow up with Dr. Haroldine Laws in 3-4 months or sooner if symptomatic.   Darla Lesches, Student-PA  10:49 AM   Patient seen and examined with the above-signed Advanced Practice Provider and/or Housestaff. I personally reviewed laboratory data, imaging studies and relevant notes. I independently examined the patient and formulated the important aspects of the plan. I have edited the note to reflect any of my changes or salient points. I have personally discussed the plan with the patient and/or family.  He is doing well. NYHA II. Volume status ok. On good meds. Has been intolerant of Bidil in past. Not interested in  sleep study. Encouraged him to try to lose weight. Check labs and repeat echo. ICD interrogation done personally No VT/AF. Volume status midlly elevated. Will have him take extra dose of torsemide. Reinforced need for daily weights and reviewed use of sliding scale diuretics.   Glori Bickers, MD  12:55 PM

## 2018-08-07 ENCOUNTER — Ambulatory Visit (INDEPENDENT_AMBULATORY_CARE_PROVIDER_SITE_OTHER): Payer: Medicare Other | Admitting: *Deleted

## 2018-08-07 ENCOUNTER — Telehealth: Payer: Self-pay | Admitting: Cardiology

## 2018-08-07 DIAGNOSIS — I472 Ventricular tachycardia, unspecified: Secondary | ICD-10-CM

## 2018-08-07 LAB — CUP PACEART INCLINIC DEVICE CHECK
Battery Voltage: 2.79 V
Brady Statistic AP VS Percent: 0 %
Brady Statistic AS VS Percent: 100 %
Brady Statistic RV Percent Paced: 0.01 %
Date Time Interrogation Session: 20200224151922
HIGH POWER IMPEDANCE MEASURED VALUE: 342 Ohm
HIGH POWER IMPEDANCE MEASURED VALUE: 65 Ohm
HighPow Impedance: 87 Ohm
Implantable Lead Implant Date: 20130515
Implantable Lead Model: 7121
Implantable Pulse Generator Implant Date: 20130515
Lead Channel Impedance Value: 4047 Ohm
Lead Channel Impedance Value: 4047 Ohm
Lead Channel Pacing Threshold Pulse Width: 0.4 ms
Lead Channel Sensing Intrinsic Amplitude: 0.5 mV
Lead Channel Sensing Intrinsic Amplitude: 10.875 mV
Lead Channel Sensing Intrinsic Amplitude: 11.125 mV
Lead Channel Setting Pacing Pulse Width: 0.4 ms
Lead Channel Setting Sensing Sensitivity: 0.3 mV
MDC IDC LEAD LOCATION: 753860
MDC IDC MSMT LEADCHNL LV IMPEDANCE VALUE: 4047 Ohm
MDC IDC MSMT LEADCHNL RA IMPEDANCE VALUE: 4047 Ohm
MDC IDC MSMT LEADCHNL RA SENSING INTR AMPL: 0.5 mV
MDC IDC MSMT LEADCHNL RV IMPEDANCE VALUE: 1026 Ohm
MDC IDC MSMT LEADCHNL RV PACING THRESHOLD AMPLITUDE: 0.75 V
MDC IDC SET LEADCHNL RV PACING AMPLITUDE: 2.5 V
MDC IDC STAT BRADY AP VP PERCENT: 0 %
MDC IDC STAT BRADY AS VP PERCENT: 0 %
MDC IDC STAT BRADY RA PERCENT PACED: 0 %

## 2018-08-07 NOTE — Telephone Encounter (Signed)
Patient called and stated that his ICD is beeping. Alert transmission received on 08-06-2018. Call routed to [place].

## 2018-08-07 NOTE — Progress Notes (Signed)
Device checked d/t RV impedance > 1000ohms, lead measurements stable trends shows gradual increase in RV impedance. Parameters changed to >1500ohms for lead impedance warning. [facility] 09/14/2018

## 2018-08-07 NOTE — Telephone Encounter (Signed)
Reviewed automatic transmission received on 08/06/18 at 03:00. RV bipolar impedance trend shows increase over time, now measuring 1026 ohms. Past 15 day trend shows steady trends. Alert programmed at >1000 ohms. SIC 1.  Spoke with patient. Advised pt that this is not an urgent alert, can come in at his convenience (though alarm will continue until it is turned off). Pt verbalizes understanding, plans to come in at 14:00 today. He denies additional questions at this time.

## 2018-08-11 ENCOUNTER — Other Ambulatory Visit (HOSPITAL_COMMUNITY): Payer: Self-pay | Admitting: Internal Medicine

## 2018-09-10 ENCOUNTER — Other Ambulatory Visit (HOSPITAL_COMMUNITY): Payer: Self-pay | Admitting: Internal Medicine

## 2018-09-11 ENCOUNTER — Other Ambulatory Visit (HOSPITAL_COMMUNITY): Payer: Self-pay

## 2018-09-11 DIAGNOSIS — I5022 Chronic systolic (congestive) heart failure: Secondary | ICD-10-CM

## 2018-09-11 MED ORDER — CARVEDILOL 25 MG PO TABS: 25.0000 mg | ORAL_TABLET | Freq: Two times a day (BID) | ORAL | 3 refills | Status: DC

## 2018-09-14 ENCOUNTER — Ambulatory Visit (INDEPENDENT_AMBULATORY_CARE_PROVIDER_SITE_OTHER): Payer: Medicare Other | Admitting: *Deleted

## 2018-09-14 ENCOUNTER — Other Ambulatory Visit: Payer: Self-pay

## 2018-09-14 DIAGNOSIS — I472 Ventricular tachycardia, unspecified: Secondary | ICD-10-CM

## 2018-09-14 LAB — CUP PACEART REMOTE DEVICE CHECK
Battery Voltage: 2.77 V
Brady Statistic AP VP Percent: 0 %
Brady Statistic AP VS Percent: 0 %
Brady Statistic AS VP Percent: 0 %
Brady Statistic AS VS Percent: 100 %
Brady Statistic RA Percent Paced: 0 %
Brady Statistic RV Percent Paced: 0.01 %
Date Time Interrogation Session: 20200402041809
HighPow Impedance: 72 Ohm
HighPow Impedance: 98 Ohm
Implantable Lead Implant Date: 20130515
Implantable Lead Location: 753860
Implantable Lead Model: 7121
Implantable Pulse Generator Implant Date: 20130515
Lead Channel Impedance Value: 1045 Ohm
Lead Channel Impedance Value: 4047 Ohm
Lead Channel Impedance Value: 4047 Ohm
Lead Channel Impedance Value: 4047 Ohm
Lead Channel Impedance Value: 4047 Ohm
Lead Channel Pacing Threshold Amplitude: 0.75 V
Lead Channel Pacing Threshold Pulse Width: 0.4 ms
Lead Channel Sensing Intrinsic Amplitude: 11.875 mV
Lead Channel Setting Pacing Amplitude: 2.5 V
Lead Channel Setting Pacing Pulse Width: 0.4 ms
Lead Channel Setting Sensing Sensitivity: 0.3 mV

## 2018-09-22 ENCOUNTER — Encounter: Payer: Self-pay | Admitting: Cardiology

## 2018-09-22 NOTE — Progress Notes (Signed)
Remote ICD transmission.   

## 2018-10-09 ENCOUNTER — Telehealth: Payer: Self-pay

## 2018-10-09 NOTE — Telephone Encounter (Signed)
See other phone note

## 2018-10-09 NOTE — Telephone Encounter (Signed)
Pt called stating his ICD is beeping. I had the patient send in a manual transmission. I told the pt the nurse will give him a call back as soon as she can. The transmission was successful. It has a lead warning alert.

## 2018-10-09 NOTE — Telephone Encounter (Signed)
SVC impedance slightly out of range. Trend overall stable. Appt made for Wednesday to change SVC impedance alerts.  Chanetta Marshall, NP 10/09/2018 4:11 PM

## 2018-10-10 ENCOUNTER — Other Ambulatory Visit (HOSPITAL_COMMUNITY): Payer: Self-pay | Admitting: Internal Medicine

## 2018-10-11 ENCOUNTER — Other Ambulatory Visit: Payer: Self-pay

## 2018-10-11 ENCOUNTER — Ambulatory Visit (INDEPENDENT_AMBULATORY_CARE_PROVIDER_SITE_OTHER): Payer: Medicare Other | Admitting: Nurse Practitioner

## 2018-10-11 DIAGNOSIS — I472 Ventricular tachycardia, unspecified: Secondary | ICD-10-CM

## 2018-10-11 LAB — CUP PACEART INCLINIC DEVICE CHECK
Date Time Interrogation Session: 20200429145716
Implantable Lead Implant Date: 20130515
Implantable Lead Location: 753860
Implantable Lead Model: 7121
Implantable Pulse Generator Implant Date: 20130515

## 2018-10-11 NOTE — Progress Notes (Signed)
ICD check in clinic. RV pacing and SVC impedance slowly rising over time. Alert criteria adjusted. Estimated longevity 8-11 months. Will send to Dr Caryl Comes for review. Follow up as scheduled.

## 2018-10-26 ENCOUNTER — Other Ambulatory Visit: Payer: Self-pay

## 2018-10-26 ENCOUNTER — Ambulatory Visit (HOSPITAL_COMMUNITY)
Admission: RE | Admit: 2018-10-26 | Discharge: 2018-10-26 | Disposition: A | Discharge status: Home / Self Care | Payer: Medicare Other | Source: Ambulatory Visit | Attending: Internal Medicine | Admitting: Internal Medicine

## 2018-10-26 DIAGNOSIS — I5022 Chronic systolic (congestive) heart failure: Secondary | ICD-10-CM

## 2018-10-26 DIAGNOSIS — I5042 Chronic combined systolic (congestive) and diastolic (congestive) heart failure: Secondary | ICD-10-CM

## 2018-10-26 DIAGNOSIS — I10 Essential (primary) hypertension: Secondary | ICD-10-CM

## 2018-10-26 NOTE — Progress Notes (Signed)
No med changes. Left voicemail to call back for appointments. avs to be mailed

## 2018-10-26 NOTE — Addendum Note (Signed)
Encounter addended by: Marlise Eves, RN on: 10/26/2018 3:15 PM  Actions taken: Visit diagnoses modified, Order list changed, Diagnosis association updated, Visit Navigator Flowsheet section accepted, Clinical Note Signed

## 2018-10-26 NOTE — Progress Notes (Signed)
Heart Failure TeleHealth Note  Due to national recommendations of social distancing due to [place] 19, Audio/video telehealth visit is felt to be most appropriate for this patient at this time.  See MyChart message from today for patient consent regarding telehealth for [facility].  Date:  10/26/2018   ID:  William Davila, DOB [birth year], MRN [id number]  Location: Home  Provider location: [facility] Type of Visit: Established patient  PCP:  Nolene Ebbs, MD  Cardiologist:  No primary care provider on file. Primary HF: [place]  Chief Complaint: Heart Failure follow-up   History of Present Illness:  William Davila is a 52 y.o. male with systolic heart failure , cardiomyopathy diagnosed 2001, hyperlipidemia, HTN, and obesity. S/P St Jude single chamber ICD 10/2011. Intolerant Bidil due to headaches.   Discharged from [facility] 05/10/2011 Discharge weight 328.  Massive volume over load and low output. Cardiac output increased inotropes. Medications initiated ace, beta blocker , digoxin, and torsemide.   He presents via [employer], civil (consulting) for a telehealth visit today.  We saw him in February for the first time since 10/18. Was feeling ok and taking all his meds as prescribed. Echo ordered but not done yet.   Last seen 03/2017. Feeling well overall. Not working currently. Getting around and doing ADLs without any problem.No edema, orthopnea or PND. Has not been weighing himself or taking his BP.  Denies CP, lightheadedness, dizziness or edema. Takes all medications as directed. He states he has never had a heart catheterization.    Echo 08/2011 EF 20-25% Echo 06/20/14 EF 25-30% RV normal  Echo 07/12/16 EF 25-30%   William Davila denies symptoms worrisome for COVID 19.   Past Medical History:  Diagnosis Date  . AICD (automatic cardioverter/defibrillator) present   . Arthritis    knee's  . Cardiomyopathy ([place])   . Chronic systolic heart failure  ([place])    Diagnosed 2001; prior patient of Dr. Linard Millers with [name] - discharged from practice  . Dilated cardiomyopathy (H[facility])    LVEF 10-20%  . Essential hypertension, benign   . Gout   . Hyperlipidemia   . Hypertension   . 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 Morbid obesity ([place])   . Presence of combination internal cardiac defibrillator (ICD) and pacemaker   . Presence of permanent cardiac pacemaker   . VT (ventricular tachycardia) ([place]) 06/07/2011   Past Surgical History:  Procedure Laterality Date  . BIOPSY  12/27/2017   Procedure: BIOPSY;  Surgeon: Yetta Flock, MD;  Location: [facility];  Service: Gastroenterology;;  . COLONOSCOPY WITH PROPOFOL N/A 12/27/2017   Procedure: COLONOSCOPY WITH PROPOFOL;  Surgeon: Yetta Flock, MD;  Location: [facility];  Service: Gastroenterology;  Laterality: N/A;  . ICD  10/27/2011  . IMPLANTABLE CARDIOVERTER DEFIBRILLATOR IMPLANT N/A 10/27/2011   Procedure: IMPLANTABLE CARDIOVERTER DEFIBRILLATOR IMPLANT;  Surgeon: Deboraha Sprang, MD;  Location: [facility] CATH LAB;  Service: Cardiovascular;  Laterality: N/A;  . POLYPECTOMY  12/27/2017   Procedure: POLYPECTOMY;  Surgeon: Yetta Flock, MD;  Location: [facility]OS[facility];  Service: Gastroenterology;;     Current Outpatient Medications  Medication Sig Dispense Refill  . acetaminophen (TYLENOL) 500 MG tablet Take 1,000 mg by mouth every 6 (six) hours as needed. For pain    . allopurinol (ZYLOPRIM) 100 MG tablet Take 100 mg by mouth daily.  1  . carvedilol (COREG) 25 MG tablet Take 1 tablet (25 mg total) by mouth  2 (two) times daily. 180 tablet 3  . colchicine 0.6 MG tablet Take 1 tablet by mouth daily.    . diclofenac (VOLTAREN) 75 MG EC tablet Take 1 tablet (75 mg total) by mouth 2 (two) times daily. (Patient not taking: Reported on 07/28/2018) 30 tablet 0  . digoxin (LANOXIN) 0.125 MG tablet TAKE 1 TABLET BY MOUTH DAILY 90 tablet 1  . ENTRESTO 97-103 MG TAKE 1  TABLET BY MOUTH TWICE DAILY 60 tablet 2  . naproxen sodium (ANAPROX DS) 550 MG tablet Take 1 tablet (550 mg total) by mouth 2 (two) times daily with a meal. 30 tablet 0  . potassium chloride SA (K-DUR) 20 MEQ tablet TAKE 1 TABLET(20 MEQ) BY MOUTH TWICE DAILY 60 tablet 3  . spironolactone (ALDACTONE) 25 MG tablet TAKE 1 TABLET BY MOUTH EVERY DAY 90 tablet 1  . torsemide (DEMADEX) 20 MG tablet TAKE 3 TABLETS BY MOUTH DAILY 270 tablet 0   No current facility-administered medications for this encounter.     Allergies:   Beta adrenergic blockers and Isosorb dinitrate-hydralazine   Social History:  The patient  reports that he has never smoked. He has never used smokeless tobacco. He reports current alcohol use. He reports that he does not use drugs.   Family History:  The patient's family history includes Hypertension in his mother.   ROS:  Please see the history of present illness.   All other systems are personally reviewed and negative.   Exam:  (Video/Tele Health Call; Exam is subjective and or/visual.) General:  Speaks in full sentences. No resp difficulty. Lungs: Normal respiratory effort with conversation.  Abdomen: Obese Non-distended per patient report Extremities: Pt denies edema. Neuro: Alert & oriented x 3.   Recent Labs: 07/28/2018: BUN 14; Creatinine, Ser 1.15; Hemoglobin 12.4; Platelets 335; Potassium 3.5; Sodium 138  Personally reviewed   Wt Readings from Last 3 Encounters:  07/28/18 (!) 176.3 kg (388 lb 9.6 oz)  12/27/17 (!) 165.6 kg (365 lb)  11/18/17 (!) 172.5 kg (380 lb 6 oz)      ASSESSMENT AND PLAN:  1. Chronic Systolic heart Failure- Medtronic Single lead ICD5/2013   - Echo 06/2016 LVEF 20-25%. Presumed NICM. Due for repeat Echo.  - Stable NYHA II-early III - Volume status seems ok. Encouraged him to weight himself more often  - Continue Torsemide 60 mg daily. Will have take extra 60 mg of Torsemide today.  - Continue Carvedilol 25 mg BID - Continue Digoxin  0.125 mg daily. Last level 0.4 in 2/20 - Continue Entresto 97/103 mg BID.   - Continue spironolactone 25 mg daily.  - Continue potassium 26meQ BID. - Intolerant to Bidil due to severe headaches. - Reinforced fluid restriction to < 2 L daily, sodium restriction to less than 2000 mg daily, and the importance of daily weights.    2.Morbid Obesity - Body mass index is 56.  - He is minimally active. Encouraged diet and exercise.   3. Probable OSA - refuses sleep study.   COVID screen The patient does not have any symptoms that suggest any further testing/ screening at this time.  Social distancing reinforced today.  Recommended follow-up:  As above  Relevant cardiac medications were reviewed at length with the patient today.   The patient does not have concerns regarding their medications at this time.   The following changes were made today:  As above  Today, I have spent 18 minutes with the patient with telehealth technology discussing the above issues .  Signed, Glori Bickers, MD  10/26/2018 2:01 PM  Advanced Heart Failure [place] [address] Heart and [place] [address] [phone] (office) [phone] (fax)

## 2018-10-26 NOTE — Patient Instructions (Signed)
Lab work will need to be done in 3-4 weeks.   Please follow up with Dr. Haroldine Laws in 4 months with an echocardiogram.  Your physician has requested that you have an echocardiogram. Echocardiography is a painless test that uses sound waves to create images of your heart. It provides your doctor with information about the size and shape of your heart and how well your heart's chambers and valves are working. This procedure takes approximately one hour. There are no restrictions for this procedure. This will be done when you have you follow up with Dr. Haroldine Laws.

## 2018-11-09 ENCOUNTER — Other Ambulatory Visit (HOSPITAL_COMMUNITY): Payer: Self-pay | Admitting: Internal Medicine

## 2018-11-09 ENCOUNTER — Other Ambulatory Visit: Payer: Self-pay

## 2018-11-09 ENCOUNTER — Ambulatory Visit (HOSPITAL_COMMUNITY)
Admission: RE | Admit: 2018-11-09 | Discharge: 2018-11-09 | Disposition: A | Discharge status: Home / Self Care | Payer: Medicare Other | Source: Ambulatory Visit | Attending: Internal Medicine | Admitting: Internal Medicine

## 2018-11-09 DIAGNOSIS — I5042 Chronic combined systolic (congestive) and diastolic (congestive) heart failure: Secondary | ICD-10-CM | POA: Diagnosis not present

## 2018-11-09 LAB — BASIC METABOLIC PANEL
Anion gap: 4 — ABNORMAL LOW (ref 5–15)
BUN: 19 mg/dL (ref 6–20)
CO2: 32 mmol/L (ref 22–32)
Calcium: 8.9 mg/dL (ref 8.9–10.3)
Chloride: 104 mmol/L (ref 98–111)
Creatinine, Ser: 1.28 mg/dL — ABNORMAL HIGH (ref 0.61–1.24)
GFR calc Af Amer: >60 mL/min (ref >60)
GFR calc non Af Amer: >60 mL/min (ref >60)
Glucose, Bld: 107 mg/dL — ABNORMAL HIGH (ref 70–99)
Potassium: 4 mmol/L (ref 3.5–5.1)
Sodium: 140 mmol/L (ref 135–145)

## 2018-12-09 ENCOUNTER — Other Ambulatory Visit (HOSPITAL_COMMUNITY): Payer: Self-pay | Admitting: Internal Medicine

## 2018-12-14 ENCOUNTER — Ambulatory Visit (INDEPENDENT_AMBULATORY_CARE_PROVIDER_SITE_OTHER): Payer: Medicare Other | Admitting: *Deleted

## 2018-12-14 DIAGNOSIS — I472 Ventricular tachycardia, unspecified: Secondary | ICD-10-CM

## 2018-12-14 LAB — CUP PACEART REMOTE DEVICE CHECK
Battery Voltage: 2.69 V
Brady Statistic AP VP Percent: 0 %
Brady Statistic AP VS Percent: 0 %
Brady Statistic AS VP Percent: 0 %
Brady Statistic AS VS Percent: 100 %
Brady Statistic RA Percent Paced: 0 %
Brady Statistic RV Percent Paced: 0.01 %
Date Time Interrogation Session: 20200702084227
HighPow Impedance: 62 Ohm
HighPow Impedance: 86 Ohm
Implantable Lead Implant Date: 20130515
Implantable Lead Location: 753860
Implantable Lead Model: 7121
Implantable Pulse Generator Implant Date: 20130515
Lead Channel Impedance Value: 1083 Ohm
Lead Channel Pacing Threshold Amplitude: 0.875 V
Lead Channel Pacing Threshold Pulse Width: 0.4 ms
Lead Channel Sensing Intrinsic Amplitude: 0.5 mV
Lead Channel Sensing Intrinsic Amplitude: 10.125 mV
Lead Channel Setting Pacing Amplitude: 2.5 V
Lead Channel Setting Pacing Pulse Width: 0.4 ms
Lead Channel Setting Sensing Sensitivity: 0.3 mV

## 2018-12-22 ENCOUNTER — Encounter: Payer: Self-pay | Admitting: Cardiology

## 2018-12-22 NOTE — Progress Notes (Signed)
Remote ICD transmission.   

## 2019-01-08 ENCOUNTER — Other Ambulatory Visit (HOSPITAL_COMMUNITY): Payer: Self-pay | Admitting: Internal Medicine

## 2019-02-26 ENCOUNTER — Other Ambulatory Visit (HOSPITAL_COMMUNITY)
Admission: RE | Admit: 2019-02-26 | Discharge: 2019-02-26 | Disposition: A | Discharge status: Home / Self Care | Payer: Medicare Other | Source: Ambulatory Visit | Attending: Internal Medicine | Admitting: Internal Medicine

## 2019-02-26 ENCOUNTER — Ambulatory Visit (HOSPITAL_COMMUNITY)
Admission: RE | Admit: 2019-02-26 | Discharge: 2019-02-26 | Disposition: A | Discharge status: Home / Self Care | Payer: Medicare Other | Source: Ambulatory Visit | Attending: Internal Medicine | Admitting: Internal Medicine

## 2019-02-26 ENCOUNTER — Ambulatory Visit (HOSPITAL_BASED_OUTPATIENT_CLINIC_OR_DEPARTMENT_OTHER)
Admission: RE | Admit: 2019-02-26 | Discharge: 2019-02-26 | Disposition: A | Discharge status: Home / Self Care | Payer: Medicare Other | Source: Ambulatory Visit | Attending: Internal Medicine | Admitting: Internal Medicine

## 2019-02-26 ENCOUNTER — Other Ambulatory Visit: Payer: Self-pay

## 2019-02-26 VITALS — BP 106/67 | HR 86 | Wt 393.1 lb

## 2019-02-26 DIAGNOSIS — I083 Combined rheumatic disorders of mitral, aortic and tricuspid valves: Secondary | ICD-10-CM | POA: Diagnosis not present

## 2019-02-26 DIAGNOSIS — I110 Hypertensive heart disease with heart failure: Secondary | ICD-10-CM | POA: Insufficient documentation

## 2019-02-26 DIAGNOSIS — Z79899 Other long term (current) drug therapy: Secondary | ICD-10-CM | POA: Insufficient documentation

## 2019-02-26 DIAGNOSIS — Z791 Long term (current) use of non-steroidal anti-inflammatories (NSAID): Secondary | ICD-10-CM | POA: Diagnosis not present

## 2019-02-26 DIAGNOSIS — I10 Essential (primary) hypertension: Secondary | ICD-10-CM

## 2019-02-26 DIAGNOSIS — I420 Dilated cardiomyopathy: Secondary | ICD-10-CM | POA: Diagnosis not present

## 2019-02-26 DIAGNOSIS — Z6841 Body Mass Index (BMI) 40.0 and over, adult: Secondary | ICD-10-CM | POA: Diagnosis not present

## 2019-02-26 DIAGNOSIS — M109 Gout, unspecified: Secondary | ICD-10-CM | POA: Insufficient documentation

## 2019-02-26 DIAGNOSIS — I5022 Chronic systolic (congestive) heart failure: Secondary | ICD-10-CM | POA: Diagnosis present

## 2019-02-26 DIAGNOSIS — Z20828 Contact with and (suspected) exposure to other viral communicable diseases: Secondary | ICD-10-CM | POA: Insufficient documentation

## 2019-02-26 DIAGNOSIS — E119 Type 2 diabetes mellitus without complications: Secondary | ICD-10-CM | POA: Diagnosis not present

## 2019-02-26 DIAGNOSIS — R0683 Snoring: Secondary | ICD-10-CM | POA: Diagnosis not present

## 2019-02-26 DIAGNOSIS — I5042 Chronic combined systolic (congestive) and diastolic (congestive) heart failure: Secondary | ICD-10-CM

## 2019-02-26 DIAGNOSIS — M1710 Unilateral primary osteoarthritis, unspecified knee: Secondary | ICD-10-CM | POA: Insufficient documentation

## 2019-02-26 DIAGNOSIS — E785 Hyperlipidemia, unspecified: Secondary | ICD-10-CM | POA: Insufficient documentation

## 2019-02-26 DIAGNOSIS — Z8249 Family history of ischemic heart disease and other diseases of the circulatory system: Secondary | ICD-10-CM | POA: Diagnosis not present

## 2019-02-26 DIAGNOSIS — Z95810 Presence of automatic (implantable) cardiac defibrillator: Secondary | ICD-10-CM | POA: Diagnosis not present

## 2019-02-26 LAB — BASIC METABOLIC PANEL
Anion gap: 11 (ref 5–15)
BUN: 18 mg/dL (ref 6–20)
CO2: 22 mmol/L (ref 22–32)
Calcium: 9 mg/dL (ref 8.9–10.3)
Chloride: 105 mmol/L (ref 98–111)
Creatinine, Ser: 1.09 mg/dL (ref 0.61–1.24)
GFR calc Af Amer: >60 mL/min (ref >60)
GFR calc non Af Amer: >60 mL/min (ref >60)
Glucose, Bld: 96 mg/dL (ref 70–99)
Potassium: 3.9 mmol/L (ref 3.5–5.1)
Sodium: 138 mmol/L (ref 135–145)

## 2019-02-26 LAB — CBC
HCT: 42.6 % (ref 39.0–52.0)
Hemoglobin: 13.5 g/dL (ref 13.0–17.0)
MCH: 30.3 pg (ref 26.0–34.0)
MCHC: 31.7 g/dL (ref 30.0–36.0)
MCV: 95.5 fL (ref 80.0–100.0)
Platelets: 354 10*3/uL (ref 150–400)
RBC: 4.46 MIL/uL (ref 4.22–5.81)
RDW: 15.3 % (ref 11.5–15.5)
WBC: 11.8 10*3/uL — ABNORMAL HIGH (ref 4.0–10.5)
nRBC: 0 % (ref 0.0–0.2)

## 2019-02-26 NOTE — Patient Instructions (Addendum)
Lab work done today. We will notify you of any abnormal lab work. No news is good news!  Your physician has recommended that you have a sleep study. Someone will be in contact with you in order to send this sleep study to your home.  Please follow up with Dr. Caryl Comes.  Please follow up with the [facility] in 4 months.  At the [facility], you and your health needs are our priority. As part of our continuing mission to provide you with exceptional heart care, we have created designated Provider Care Teams. These Care Teams include your primary Cardiologist (physician) and Advanced Practice Providers (APPs- Physician Assistants and Nurse Practitioners) who all work together to provide you with the care you need, when you need it.   You may see any of the following providers on your designated Care Team at your next follow up: Marland Kitchen Dr Glori Bickers . Dr Loralie Champagne . Darrick Grinder, NP   Please be sure to bring in all your medications bottles to every appointment.   YOU WILL NEED TO GET A COVID 19 TEST TODAY.    [facility] [place] [id number] [place] [address] Dept: [phone] Loc: [phone]  GERRETT ARAGONEZ  02/26/2019  You are scheduled for a Cardiac Catheterization on Thursday, September 16 with Dr. Glori Bickers.  1. Please arrive at the [facility] (Main Entrance A) at [facility]: [address] [place], [place] [address] at 8:00 AM (This time is two hours before your procedure to ensure your preparation). Free valet parking service is available.   Special note: Every effort is made to have your procedure done on time. Please understand that emergencies sometimes delay scheduled procedures.  2. Diet: Do not eat solid foods after midnight.  The patient may have clear liquids until 5am upon the day of the procedure.  3. Labs: Lab work was  drawn today for your procedure.  4. Medication instructions in preparation for your procedure:   Contrast Allergy: No  Please do not take Torsemide or Spironolactone the morning of your procedure.   You may take the rest of your medications. You may use sips of water.  5. Plan for one night stay--bring personal belongings. 6. Bring a current list of your medications and current insurance cards. 7. You MUST have a responsible person to drive you home. 8. Someone MUST be with you the first 24 hours after you arrive home or your discharge will be delayed. 9. Please wear clothes that are easy to get on and off and wear slip-on shoes.  Thank you for allowing [place] to care for you!   -- [facility]

## 2019-02-26 NOTE — Progress Notes (Signed)
Order, OV note, stop bang and demographics all faxed to [facility] at [phone] via epic  

## 2019-02-26 NOTE — Progress Notes (Signed)
Patient Name:         DOB:       Height:     Weight:  Office Name:         Referring Provider:  Today's Date:  Date:   STOP BANG RISK ASSESSMENT S (snore) Have you been told that you snore?     YES   T (tired) Are you often tired, fatigued, or sleepy during the day?   YES  O (obstruction) Do you stop breathing, choke, or gasp during sleep? NO   P (pressure) Do you have or are you being treated for high blood pressure? YES   B (BMI) Is your body index greater than 35 kg/m? YES   A (age) Are you 51 years old or older? YES   N (neck) Do you have a neck circumference greater than 16 inches?   YES   G (gender) Are you a male? YES   TOTAL STOP/BANG "YES" ANSWERS                                                                        For Office Use Only              Procedure Order Form    YES to 3+ Stop Bang questions OR two clinical symptoms - patient qualifies for WatchPAT (CPT 95800)     Submit: This Form + Patient Face Sheet + Clinical Note via CloudPAT or Fax: [phone]         Clinical Notes: Will consult Sleep Specialist and refer for management of therapy due to patient increased risk of Sleep Apnea. Ordering a sleep study due to the following two clinical symptoms: Excessive daytime sleepiness G47.10 / Gastroesophageal reflux K21.9 / Nocturia R35.1 / Morning Headaches G44.221 / Difficulty concentrating R41.840 / Memory problems or poor judgment G31.84 / Personality changes or irritability R45.4 / Loud snoring R06.83 / Depression F32.9 / Unrefreshed by sleep G47.8 / Impotence N52.9 / History of high blood pressure R03.0 / Insomnia G47.00    I understand that I am proceeding with a home sleep apnea test as ordered by my treating physician. I understand that untreated sleep apnea is a serious cardiovascular risk factor and it is my responsibility to perform the test and seek management for sleep apnea. I will be contacted with the results and be managed for sleep apnea by a  local sleep physician. I will be receiving equipment and further instructions from [facility]. I shall promptly ship back the equipment via the included mailing label. I understand my insurance will be billed for the test and as the patient I am responsible for any insurance related out-of-pocket costs incurred. I have been provided with written instructions and can call for additional video or telephonic instruction, with 24-hour availability of qualified personnel to answer any questions: Patient Help Desk [phone].

## 2019-02-26 NOTE — Progress Notes (Signed)
Echocardiogram 2D Echocardiogram has been performed.  William Davila Linzee Depaul 02/26/2019, 9:39 AM

## 2019-02-26 NOTE — Progress Notes (Signed)
Advanced Heart Failure Clinic Note   Date:  02/26/2019   ID:  William Davila, DOB 1966/02/23, MRN [id number]  Location: Home  Provider location: [facility] Type of Visit: Established patient  PCP:  William Ebbs, MD  Cardiologist:  No primary care provider on file. Primary HF:   Chief Complaint: Heart Failure follow-up   History of Present Illness:  William Davila is a 53 y.o. male with systolic heart failure , cardiomyopathy diagnosed 2001, hyperlipidemia, HTN, and obesity. S/P St Jude single chamber ICD 10/2011. Intolerant Bidil due to headaches.    Discharged from [facility] 05/10/2011 Discharge weight 328.  Massive volume over load and low output. Cardiac output increased inotropes. Medications initiated ace, beta blocker , digoxin, and torsemide.   He presents for regular f/u.  We had telehealth visit in 5/20 before that we had not seen him since 10/18. Feels "pretty good." Denieis CP or SOB. Not very active. Says he is taking meds as prescribed. Can walk in grocery store without problem. Struggles with stairs due to his legs. Has gained 30 pounds in last year. Denies orthopnea, PND or LE edema     Echo 08/2011 EF 20-25% Echo 06/20/14 EF 25-30% RV normal  Echo 07/12/16 EF 25-30%   William Davila denies symptoms worrisome for COVID 19.   Past Medical History:  Diagnosis Date  . AICD (automatic cardioverter/defibrillator) present   . Arthritis    knee's  . Cardiomyopathy ([place])   . Chronic systolic heart failure ([place])    Diagnosed 2001; prior patient of Dr. Linard Davila with [facility] - discharged from practice  . Dilated cardiomyopathy (HCC)    LVEF 10-20%  . Essential hypertension, benign   . Gout   . Hyperlipidemia   . Hypertension   . 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 Morbid obesity ([place])   . Presence of combination internal cardiac defibrillator (ICD) and pacemaker   . Presence of permanent cardiac pacemaker    . VT (ventricular tachycardia) ([place]) 06/07/2011   Past Surgical History:  Procedure Laterality Date  . BIOPSY  12/27/2017   Procedure: BIOPSY;  Surgeon: William Flock, MD;  Location: [facility];  Service: Gastroenterology;;  . COLONOSCOPY WITH PROPOFOL N/A 12/27/2017   Procedure: COLONOSCOPY WITH PROPOFOL;  Surgeon: William Flock, MD;  Location: [facility];  Service: Gastroenterology;  Laterality: N/A;  . ICD  10/27/2011  . IMPLANTABLE CARDIOVERTER DEFIBRILLATOR IMPLANT N/A 10/27/2011   Procedure: IMPLANTABLE CARDIOVERTER DEFIBRILLATOR IMPLANT;  Surgeon: William Sprang, MD;  [facility] CATH LAB;  Service: Cardiovascular;  Laterality: N/A;  . POLYPECTOMY  12/27/2017   Procedure: POLYPECTOMY;  Surgeon: William Flock, MD;  Location: [facility];  Service: Gastroenterology;;     Current Outpatient Medications  Medication Sig Dispense Refill  . acetaminophen (TYLENOL) 500 MG tablet Take 1,000 mg by mouth every 6 (six) hours as needed. For pain    . allopurinol (ZYLOPRIM) 100 MG tablet Take 100 mg by mouth daily.  1  . carvedilol (COREG) 25 MG tablet Take 1 tablet (25 mg total) by mouth 2 (two) times daily. 180 tablet 3  . colchicine 0.6 MG tablet Take 1 tablet by mouth daily.    . digoxin (LANOXIN) 0.125 MG tablet TAKE 1 TABLET BY MOUTH DAILY 90 tablet 1  . ENTRESTO 97-103 MG TAKE 1 TABLET BY MOUTH TWICE DAILY 60 tablet 2  . naproxen sodium (ANAPROX DS) 550 MG tablet Take 1  tablet (550 mg total) by mouth 2 (two) times daily with a meal. 30 tablet 0  . potassium chloride SA (K-DUR) 20 MEQ tablet TAKE 1 TABLET(20 MEQ) BY MOUTH TWICE DAILY 60 tablet 3  . spironolactone (ALDACTONE) 25 MG tablet TAKE 1 TABLET BY MOUTH EVERY DAY 90 tablet 1  . torsemide (DEMADEX) 20 MG tablet TAKE 3 TABLETS BY MOUTH DAILY 270 tablet 1   No current facility-administered medications for this encounter.     Allergies:   Beta adrenergic blockers and Isosorb dinitrate-hydralazine   Social  History:  The patient  reports that he has never smoked. He has never used smokeless tobacco. He reports current alcohol use. He reports that he does not use drugs.   Family History:  The patient's family history includes Hypertension in his mother.   ROS:  Please see the history of present illness.   All other systems are personally reviewed and negative.   Vitals:   02/26/19 1039  BP: 106/67  Pulse: 86  SpO2: 97%  Weight: (!) 178.3 kg (393 lb 2 oz)    Exam:  General:  Obese. No resp difficulty HEENT: normal Neck: supple. No obvious JVD. Carotids 2+ bilat; no bruits. No lymphadenopathy or thryomegaly appreciated. Cor: PMI nondisplaced. Regular rate & rhythm. No rubs, gallops or murmurs. Lungs: clear Abdomen: obese soft, nontender, nondistended. No hepatosplenomegaly. No bruits or masses. Good bowel sounds. Extremities: no cyanosis, clubbing, rash, edema Neuro: alert & orientedx3, cranial nerves grossly intact. moves all 4 extremities w/o difficulty. Affect pleasant  Recent Labs: 07/28/2018: Hemoglobin 12.4; Platelets 335 11/09/2018: BUN 19; Creatinine, Ser 1.28; Potassium 4.0; Sodium 140  Personally reviewed   Wt Readings from Last 3 Encounters:  02/26/19 (!) 178.3 kg (393 lb 2 oz)  07/28/18 (!) 176.3 kg (388 lb 9.6 oz)  12/27/17 (!) 165.6 kg (365 lb)    ICD interrogated in Clinic: No VT/VF. Volume status look good. Personally reviewed   ASSESSMENT AND PLAN:  1. Chronic Systolic heart Failure- Medtronic Single lead ICD5/2013   - Echo 06/2016 LVEF 20-25%. Presumed NICM.  - Echo today EF 25-30% Personally reviewed - Stable NYHA II-early III - Volume status ok despite weight gain. - Continue Torsemide 60 mg daily.  - Continue Carvedilol 25 mg BID - Continue Digoxin 0.125 mg daily. Last level 0.4 in 2/20 - Continue Entresto 97/103 mg BID.   - Continue spironolactone 25 mg daily.  - Continue potassium 40meQ BID. - Intolerant to Bidil due to severe headaches. - Reinforced  fluid restriction to < 2 L daily, sodium restriction to less than 2000 mg daily, and the importance of daily weights.  - He has never had a heart cath. Given persistent LV dysfunction with schedule R/L cath - Will refer to Dr. Caryl Comes to assess battery life and if he thinks upgrade to CRT may benefit him with LBBB.  - Consider Farxiga  2.Morbid Obesity - Body mass index is 57.22 kg/m. - He is minimally active. Encouraged weight loss and more activity  3. Probable OSA - now agrees to hoe sleep study   Signed, Glori Bickers, MD  02/26/2019 10:58 AM  Advanced Heart Failure [place] [address] [phone] (office) [phone] (fax)

## 2019-02-26 NOTE — H&P (View-Only) (Signed)
Advanced Heart Failure Clinic Note   Date:  02/26/2019   ID:  HENDRICK SCHIELKE, DOB [birth year], MRN [id number]  Location: Home  Provider location: [facility] Type of Visit: Established patient  PCP:  Nolene Ebbs, MD  Cardiologist:  No primary care provider on file. Primary HF: Helaine Yackel  Chief Complaint: Heart Failure follow-up   History of Present Illness:  William Davila is a 53 y.o. male with systolic heart failure , cardiomyopathy diagnosed 2001, hyperlipidemia, HTN, and obesity. S/P St Jude single chamber ICD 10/2011. Intolerant Bidil due to headaches.    Discharged from [facility] 05/10/2011 Discharge weight 328.  Massive volume over load and low output. Cardiac output increased inotropes. Medications initiated ace, beta blocker , digoxin, and torsemide.   He presents for regular f/u.  We had telehealth visit in 5/20 before that we had not seen him since 10/18. Feels "pretty good." Denieis CP or SOB. Not very active. Says he is taking meds as prescribed. Can walk in grocery store without problem. Struggles with stairs due to his legs. Has gained 30 pounds in last year. Denies orthopnea, PND or LE edema     Echo 08/2011 EF 20-25% Echo 06/20/14 EF 25-30% RV normal  Echo 07/12/16 EF 25-30%   Cresenciano Lick Kliebert denies symptoms worrisome for COVID 19.   Past Medical History:  Diagnosis Date  . AICD (automatic cardioverter/defibrillator) present   . Arthritis    knee's  . Cardiomyopathy ([place])   . Chronic systolic heart failure ([place])    Diagnosed 2001; prior patient of Dr. Linard Millers with [name] - discharged from practice  . Dilated cardiomyopathy (H[facility])    LVEF 10-20%  . Essential hypertension, benign   . Gout   . Hyperlipidemia   . Hypertension   . 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 Morbid obesity ([place])   . Presence of combination internal cardiac defibrillator (ICD) and pacemaker   . Presence of permanent cardiac pacemaker    . VT (ventricular tachycardia) ([place]) 06/07/2011   Past Surgical History:  Procedure Laterality Date  . BIOPSY  12/27/2017   Procedure: BIOPSY;  Surgeon: Yetta Flock, MD;  Location: [facility];  Service: Gastroenterology;;  . COLONOSCOPY WITH PROPOFOL N/A 12/27/2017   Procedure: COLONOSCOPY WITH PROPOFOL;  Surgeon: Yetta Flock, MD;  Location: [facility];  Service: Gastroenterology;  Laterality: N/A;  . ICD  10/27/2011  . IMPLANTABLE CARDIOVERTER DEFIBRILLATOR IMPLANT N/A 10/27/2011   Procedure: IMPLANTABLE CARDIOVERTER DEFIBRILLATOR IMPLANT;  Surgeon: Deboraha Sprang, MD;  Location: [facility] CATH LAB;  Service: Cardiovascular;  Laterality: N/A;  . POLYPECTOMY  12/27/2017   Procedure: POLYPECTOMY;  Surgeon: Yetta Flock, MD;  Location: [facility];  Service: Gastroenterology;;     Current Outpatient Medications  Medication Sig Dispense Refill  . acetaminophen (TYLENOL) 500 MG tablet Take 1,000 mg by mouth every 6 (six) hours as needed. For pain    . allopurinol (ZYLOPRIM) 100 MG tablet Take 100 mg by mouth daily.  1  . carvedilol (COREG) 25 MG tablet Take 1 tablet (25 mg total) by mouth 2 (two) times daily. 180 tablet 3  . colchicine 0.6 MG tablet Take 1 tablet by mouth daily.    . digoxin (LANOXIN) 0.125 MG tablet TAKE 1 TABLET BY MOUTH DAILY 90 tablet 1  . ENTRESTO 97-103 MG TAKE 1 TABLET BY MOUTH TWICE DAILY 60 tablet 2  . naproxen sodium (ANAPROX DS) 550 MG tablet Take 1  tablet (550 mg total) by mouth 2 (two) times daily with a meal. 30 tablet 0  . potassium chloride SA (K-DUR) 20 MEQ tablet TAKE 1 TABLET(20 MEQ) BY MOUTH TWICE DAILY 60 tablet 3  . spironolactone (ALDACTONE) 25 MG tablet TAKE 1 TABLET BY MOUTH EVERY DAY 90 tablet 1  . torsemide (DEMADEX) 20 MG tablet TAKE 3 TABLETS BY MOUTH DAILY 270 tablet 1   No current facility-administered medications for this encounter.     Allergies:   Beta adrenergic blockers and Isosorb dinitrate-hydralazine   Social  History:  The patient  reports that he has never smoked. He has never used smokeless tobacco. He reports current alcohol use. He reports that he does not use drugs.   Family History:  The patient's family history includes Hypertension in his mother.   ROS:  Please see the history of present illness.   All other systems are personally reviewed and negative.   Vitals:   02/26/19 1039  BP: 106/67  Pulse: 86  SpO2: 97%  Weight: (!) 178.3 kg (393 lb 2 oz)    Exam:  General:  Obese. No resp difficulty HEENT: normal Neck: supple. No obvious JVD. Carotids 2+ bilat; no bruits. No lymphadenopathy or thryomegaly appreciated. Cor: PMI nondisplaced. Regular rate & rhythm. No rubs, gallops or murmurs. Lungs: clear Abdomen: obese soft, nontender, nondistended. No hepatosplenomegaly. No bruits or masses. Good bowel sounds. Extremities: no cyanosis, clubbing, rash, edema Neuro: alert & orientedx3, cranial nerves grossly intact. moves all 4 extremities w/o difficulty. Affect pleasant  Recent Labs: 07/28/2018: Hemoglobin 12.4; Platelets 335 11/09/2018: BUN 19; Creatinine, Ser 1.28; Potassium 4.0; Sodium 140  Personally reviewed   Wt Readings from Last 3 Encounters:  02/26/19 (!) 178.3 kg (393 lb 2 oz)  07/28/18 (!) 176.3 kg (388 lb 9.6 oz)  12/27/17 (!) 165.6 kg (365 lb)    ICD interrogated in Clinic: No VT/VF. Volume status look good. Personally reviewed   ASSESSMENT AND PLAN:  1. Chronic Systolic heart Failure- Medtronic Single lead ICD5/2013   - Echo 06/2016 LVEF 20-25%. Presumed NICM.  - Echo today EF 25-30% Personally reviewed - Stable NYHA II-early III - Volume status ok despite weight gain. - Continue Torsemide 60 mg daily.  - Continue Carvedilol 25 mg BID - Continue Digoxin 0.125 mg daily. Last level 0.4 in 2/20 - Continue Entresto 97/103 mg BID.   - Continue spironolactone 25 mg daily.  - Continue potassium 59meQ BID. - Intolerant to Bidil due to severe headaches. - Reinforced  fluid restriction to < 2 L daily, sodium restriction to less than 2000 mg daily, and the importance of daily weights.  - He has never had a heart cath. Given persistent LV dysfunction with schedule R/L cath - Will refer to Dr. Caryl Comes to assess battery life and if he thinks upgrade to CRT may benefit him with LBBB.  - Consider Farxiga  2.Morbid Obesity - Body mass index is 57.22 kg/m. - He is minimally active. Encouraged weight loss and more activity  3. Probable OSA - now agrees to hoe sleep study   Signed, Glori Bickers, MD  02/26/2019 10:58 AM  [facility] [address] [place] [address] [phone] (office) [phone] (fax)

## 2019-02-27 LAB — NOVEL CORONAVIRUS, NAA (HOSP ORDER, SEND-OUT TO REF LAB; TAT 18-24 HRS): SARS-CoV-2, NAA: NOT DETECTED

## 2019-03-01 ENCOUNTER — Encounter (HOSPITAL_COMMUNITY)
Admission: RE | Disposition: A | Discharge status: Home / Self Care | Payer: Self-pay | Source: Home / Self Care | Attending: Internal Medicine

## 2019-03-01 ENCOUNTER — Ambulatory Visit (HOSPITAL_COMMUNITY)
Admission: RE | Admit: 2019-03-01 | Discharge: 2019-03-01 | Disposition: A | Discharge status: Home / Self Care | Payer: Medicare Other | Attending: Internal Medicine | Admitting: Internal Medicine

## 2019-03-01 ENCOUNTER — Other Ambulatory Visit: Payer: Self-pay

## 2019-03-01 DIAGNOSIS — Z888 Allergy status to other drugs, medicaments and biological substances status: Secondary | ICD-10-CM | POA: Insufficient documentation

## 2019-03-01 DIAGNOSIS — E785 Hyperlipidemia, unspecified: Secondary | ICD-10-CM | POA: Insufficient documentation

## 2019-03-01 DIAGNOSIS — Z79899 Other long term (current) drug therapy: Secondary | ICD-10-CM | POA: Diagnosis not present

## 2019-03-01 DIAGNOSIS — I110 Hypertensive heart disease with heart failure: Secondary | ICD-10-CM | POA: Diagnosis not present

## 2019-03-01 DIAGNOSIS — M1990 Unspecified osteoarthritis, unspecified site: Secondary | ICD-10-CM | POA: Insufficient documentation

## 2019-03-01 DIAGNOSIS — Z8249 Family history of ischemic heart disease and other diseases of the circulatory system: Secondary | ICD-10-CM | POA: Insufficient documentation

## 2019-03-01 DIAGNOSIS — Z6841 Body Mass Index (BMI) 40.0 and over, adult: Secondary | ICD-10-CM | POA: Diagnosis not present

## 2019-03-01 DIAGNOSIS — Z95810 Presence of automatic (implantable) cardiac defibrillator: Secondary | ICD-10-CM | POA: Diagnosis not present

## 2019-03-01 DIAGNOSIS — I5022 Chronic systolic (congestive) heart failure: Secondary | ICD-10-CM | POA: Diagnosis not present

## 2019-03-01 DIAGNOSIS — I420 Dilated cardiomyopathy: Secondary | ICD-10-CM | POA: Insufficient documentation

## 2019-03-01 DIAGNOSIS — M109 Gout, unspecified: Secondary | ICD-10-CM | POA: Diagnosis not present

## 2019-03-01 HISTORY — PX: RIGHT/LEFT HEART CATH AND CORONARY ANGIOGRAPHY: CATH118266

## 2019-03-01 LAB — POCT I-STAT EG7
Acid-Base Excess: 1 mmol/L (ref 0.0–2.0)
Acid-Base Excess: 1 mmol/L (ref 0.0–2.0)
Bicarbonate: 25.9 mmol/L (ref 20.0–28.0)
Bicarbonate: 26.6 mmol/L (ref 20.0–28.0)
Bicarbonate: 27.1 mmol/L (ref 20.0–28.0)
Calcium, Ion: 1.19 mmol/L (ref 1.15–1.40)
Calcium, Ion: 1.21 mmol/L (ref 1.15–1.40)
Calcium, Ion: 1.25 mmol/L (ref 1.15–1.40)
HCT: 38 % — ABNORMAL LOW (ref 39.0–52.0)
HCT: 39 % (ref 39.0–52.0)
HCT: 40 % (ref 39.0–52.0)
Hemoglobin: 12.9 g/dL — ABNORMAL LOW (ref 13.0–17.0)
Hemoglobin: 13.3 g/dL (ref 13.0–17.0)
Hemoglobin: 13.6 g/dL (ref 13.0–17.0)
O2 Saturation: 71 %
O2 Saturation: 75 %
O2 Saturation: 79 %
Potassium: 3.8 mmol/L (ref 3.5–5.1)
Potassium: 4 mmol/L (ref 3.5–5.1)
Potassium: 4 mmol/L (ref 3.5–5.1)
Sodium: 141 mmol/L (ref 135–145)
Sodium: 142 mmol/L (ref 135–145)
Sodium: 142 mmol/L (ref 135–145)
TCO2: 27 mmol/L (ref 22–32)
TCO2: 28 mmol/L (ref 22–32)
TCO2: 29 mmol/L (ref 22–32)
pCO2, Ven: 45.3 mmHg (ref 44.0–60.0)
pCO2, Ven: 45.9 mmHg (ref 44.0–60.0)
pCO2, Ven: 46.5 mmHg (ref 44.0–60.0)
pH, Ven: 7.365 (ref 7.250–7.430)
pH, Ven: 7.37 (ref 7.250–7.430)
pH, Ven: 7.373 (ref 7.250–7.430)
pO2, Ven: 39 mmHg (ref 32.0–45.0)
pO2, Ven: 42 mmHg (ref 32.0–45.0)
pO2, Ven: 45 mmHg (ref 32.0–45.0)

## 2019-03-01 LAB — POCT I-STAT 7, (LYTES, BLD GAS, ICA,H+H)
Acid-base deficit: 3 mmol/L — ABNORMAL HIGH (ref 0.0–2.0)
Bicarbonate: 22.5 mmol/L (ref 20.0–28.0)
Calcium, Ion: 0.94 mmol/L — ABNORMAL LOW (ref 1.15–1.40)
HCT: 34 % — ABNORMAL LOW (ref 39.0–52.0)
Hemoglobin: 11.6 g/dL — ABNORMAL LOW (ref 13.0–17.0)
O2 Saturation: 98 %
Potassium: 3.3 mmol/L — ABNORMAL LOW (ref 3.5–5.1)
Sodium: 147 mmol/L — ABNORMAL HIGH (ref 135–145)
TCO2: 24 mmol/L (ref 22–32)
pCO2 arterial: 39.2 mmHg (ref 32.0–48.0)
pH, Arterial: 7.367 (ref 7.350–7.450)
pO2, Arterial: 115 mmHg — ABNORMAL HIGH (ref 83.0–108.0)

## 2019-03-01 SURGERY — RIGHT/LEFT HEART CATH AND CORONARY ANGIOGRAPHY
Anesthesia: LOCAL

## 2019-03-01 MED ORDER — FENTANYL CITRATE (PF) 100 MCG/2ML IJ SOLN
INTRAMUSCULAR | Status: DC | PRN
  Administered 2019-03-01: 50 ug via INTRAVENOUS

## 2019-03-01 MED ORDER — VERAPAMIL HCL 2.5 MG/ML IV SOLN
INTRAVENOUS | Status: AC
  Filled 2019-03-01: qty 2

## 2019-03-01 MED ORDER — VERAPAMIL HCL 2.5 MG/ML IV SOLN
INTRAVENOUS | Status: DC | PRN
  Administered 2019-03-01: 10 mL via INTRA_ARTERIAL

## 2019-03-01 MED ORDER — SODIUM CHLORIDE 0.9% FLUSH: 3.0000 mL | INTRAVENOUS | Status: DC | PRN

## 2019-03-01 MED ORDER — HYDRALAZINE HCL 20 MG/ML IJ SOLN: 10.0000 mg | INTRAMUSCULAR | Status: DC | PRN

## 2019-03-01 MED ORDER — ACETAMINOPHEN 325 MG PO TABS: 650.0000 mg | ORAL_TABLET | ORAL | Status: DC | PRN

## 2019-03-01 MED ORDER — ASPIRIN 81 MG PO CHEW
CHEWABLE_TABLET | ORAL | Status: AC
  Administered 2019-03-01: 81 mg via ORAL
  Filled 2019-03-01: qty 1

## 2019-03-01 MED ORDER — SODIUM CHLORIDE 0.9 % IV SOLN
INTRAVENOUS | Status: DC
  Administered 2019-03-01: 09:00:00 via INTRAVENOUS

## 2019-03-01 MED ORDER — MIDAZOLAM HCL 2 MG/2ML IJ SOLN
INTRAMUSCULAR | Status: AC
  Filled 2019-03-01: qty 2

## 2019-03-01 MED ORDER — HEPARIN (PORCINE) IN NACL 1000-0.9 UT/500ML-% IV SOLN
INTRAVENOUS | Status: DC | PRN
  Administered 2019-03-01 (×2): 500 mL

## 2019-03-01 MED ORDER — HEPARIN SODIUM (PORCINE) 1000 UNIT/ML IJ SOLN
INTRAMUSCULAR | Status: AC
  Filled 2019-03-01: qty 1

## 2019-03-01 MED ORDER — LIDOCAINE HCL (PF) 1 % IJ SOLN
INTRAMUSCULAR | Status: AC
  Filled 2019-03-01: qty 30

## 2019-03-01 MED ORDER — MIDAZOLAM HCL 2 MG/2ML IJ SOLN
INTRAMUSCULAR | Status: DC | PRN
  Administered 2019-03-01: 1 mg via INTRAVENOUS

## 2019-03-01 MED ORDER — SODIUM CHLORIDE 0.9% FLUSH: 3.0000 mL | Freq: Two times a day (BID) | INTRAVENOUS | Status: DC

## 2019-03-01 MED ORDER — FENTANYL CITRATE (PF) 100 MCG/2ML IJ SOLN
INTRAMUSCULAR | Status: AC
  Filled 2019-03-01: qty 2

## 2019-03-01 MED ORDER — ASPIRIN 81 MG PO CHEW
81.0000 mg | CHEWABLE_TABLET | ORAL | Status: AC
  Administered 2019-03-01: 09:00:00 81 mg via ORAL

## 2019-03-01 MED ORDER — IOHEXOL 350 MG/ML SOLN
INTRAVENOUS | Status: DC | PRN
  Administered 2019-03-01: 12:00:00 45 mL via INTRACARDIAC

## 2019-03-01 MED ORDER — HEPARIN (PORCINE) IN NACL 1000-0.9 UT/500ML-% IV SOLN
INTRAVENOUS | Status: AC
  Filled 2019-03-01: qty 1000

## 2019-03-01 MED ORDER — SODIUM CHLORIDE 0.9 % IV SOLN: 250.0000 mL | INTRAVENOUS | Status: DC | PRN

## 2019-03-01 MED ORDER — ONDANSETRON HCL 4 MG/2ML IJ SOLN: 4.0000 mg | Freq: Four times a day (QID) | INTRAMUSCULAR | Status: DC | PRN

## 2019-03-01 MED ORDER — HEPARIN SODIUM (PORCINE) 1000 UNIT/ML IJ SOLN
INTRAMUSCULAR | Status: DC | PRN
  Administered 2019-03-01: 6000 [IU] via INTRAVENOUS

## 2019-03-01 MED ORDER — SODIUM CHLORIDE 0.9 % IV SOLN: INTRAVENOUS | Status: AC

## 2019-03-01 MED ORDER — LIDOCAINE HCL (PF) 1 % IJ SOLN
INTRAMUSCULAR | Status: DC | PRN
  Administered 2019-03-01: 5 mL via INTRADERMAL

## 2019-03-01 MED ORDER — LABETALOL HCL 5 MG/ML IV SOLN: 10.0000 mg | INTRAVENOUS | Status: DC | PRN

## 2019-03-01 SURGICAL SUPPLY — 9 items
CATH 5FR JL3.5 JR4 ANG PIG MP (CATHETERS) ×1 IMPLANT
CATH BALLN WEDGE 5F 110CM (CATHETERS) ×1 IMPLANT
DEVICE RAD COMP TR BAND LRG (VASCULAR PRODUCTS) ×1 IMPLANT
GLIDESHEATH SLEND SS 6F .021 (SHEATH) ×1 IMPLANT
GUIDEWIRE INQWIRE 1.5J.035X260 (WIRE) IMPLANT
INQWIRE 1.5J .035X260CM (WIRE) ×2
PACK CARDIAC CATHETERIZATION (CUSTOM PROCEDURE TRAY) ×2 IMPLANT
SHEATH GLIDE SLENDER 4/5FR (SHEATH) ×1 IMPLANT
TRANSDUCER W/STOPCOCK (MISCELLANEOUS) ×2 IMPLANT

## 2019-03-01 NOTE — Interval H&P Note (Signed)
History and Physical Interval Note:  03/01/2019 10:59 AM  William Davila  has presented today for surgery, with the diagnosis of heart failure.  The various methods of treatment have been discussed with the patient and family. After consideration of risks, benefits and other options for treatment, the patient has consented to  Procedure(s): RIGHT/LEFT HEART CATH AND CORONARY ANGIOGRAPHY (N/A) and possible coronary angioplasty as a surgical intervention.  The patient's history has been reviewed, patient examined, no change in status, stable for surgery.  I have reviewed the patient's chart and labs.  Questions were answered to the patient's satisfaction.     [name]

## 2019-03-01 NOTE — Discharge Instructions (Signed)
Radial Site Care ° °This sheet gives you information about how to care for yourself after your procedure. Your health care provider may also give you more specific instructions. If you have problems or questions, contact your health care provider. °What can I expect after the procedure? °After the procedure, it is common to have: °· Bruising and tenderness at the catheter insertion area. °Follow these instructions at home: °Medicines °· Take over-the-counter and prescription medicines only as told by your health care provider. °Insertion site care °· Follow instructions from your health care provider about how to take care of your insertion site. Make sure you: °? Wash your hands with soap and water before you change your bandage (dressing). If soap and water are not available, use hand sanitizer. °? Change your dressing as told by your health care provider. °? Leave stitches (sutures), skin glue, or adhesive strips in place. These skin closures may need to stay in place for 2 weeks or longer. If adhesive strip edges start to loosen and curl up, you may trim the loose edges. Do not remove adhesive strips completely unless your health care provider tells you to do that. °· Check your insertion site every day for signs of infection. Check for: °? Redness, swelling, or pain. °? Fluid or blood. °? Pus or a bad smell. °? Warmth. °· Do not take baths, swim, or use a hot tub until your health care provider approves. °· You may shower 24-48 hours after the procedure, or as directed by your health care provider. °? Remove the dressing and gently wash the site with plain soap and water. °? Pat the area dry with a clean towel. °? Do not rub the site. That could cause bleeding. °· Do not apply powder or lotion to the site. °Activity ° °· For 24 hours after the procedure, or as directed by your health care provider: °? Do not flex or bend the affected arm. °? Do not push or pull heavy objects with the affected arm. °? Do not  drive yourself home from the hospital or clinic. You may drive 24 hours after the procedure unless your health care provider tells you not to. °? Do not operate machinery or power tools. °· Do not lift anything that is heavier than 10 lb (4.5 kg), or the limit that you are told, until your health care provider says that it is safe. °· Ask your health care provider when it is okay to: °? Return to work or school. °? Resume usual physical activities or sports. °? Resume sexual activity. °General instructions °· If the catheter site starts to bleed, raise your arm and put firm pressure on the site. If the bleeding does not stop, get help right away. This is a medical emergency. °· If you went home on the same day as your procedure, a responsible adult should be with you for the first 24 hours after you arrive home. °· Keep all follow-up visits as told by your health care provider. This is important. °Contact a health care provider if: °· You have a fever. °· You have redness, swelling, or yellow drainage around your insertion site. °Get help right away if: °· You have unusual pain at the radial site. °· The catheter insertion area swells very fast. °· The insertion area is bleeding, and the bleeding does not stop when you hold steady pressure on the area. °· Your arm or hand becomes pale, cool, tingly, or numb. °These symptoms may represent a serious problem   that is an emergency. Do not wait to see if the symptoms will go away. Get medical help right away. Call your local emergency services (911 in the [place] Do not drive yourself to the hospital. °Summary °· After the procedure, it is common to have bruising and tenderness at the site. °· Follow instructions from your health care provider about how to take care of your radial site wound. Check the wound every day for signs of infection. °· Do not lift anything that is heavier than 10 lb (4.5 kg), or the limit that you are told, until your health care provider says  that it is safe. °This information is not intended to replace advice given to you by your health care provider. Make sure you discuss any questions you have with your health care provider. °Document Released: 07/03/2010 Document Revised: 07/06/2017 Document Reviewed: 07/06/2017 °Elsevier Patient Education © 2020 Elsevier Inc. ° °

## 2019-03-02 ENCOUNTER — Encounter (HOSPITAL_COMMUNITY): Payer: Self-pay | Admitting: Internal Medicine

## 2019-03-15 ENCOUNTER — Ambulatory Visit (INDEPENDENT_AMBULATORY_CARE_PROVIDER_SITE_OTHER): Payer: Medicare Other | Admitting: *Deleted

## 2019-03-15 ENCOUNTER — Encounter: Payer: Self-pay | Admitting: Internal Medicine

## 2019-03-15 DIAGNOSIS — I472 Ventricular tachycardia, unspecified: Secondary | ICD-10-CM

## 2019-03-15 DIAGNOSIS — I428 Other cardiomyopathies: Secondary | ICD-10-CM

## 2019-03-15 LAB — CUP PACEART REMOTE DEVICE CHECK
Battery Voltage: 2.64 V
Brady Statistic AP VP Percent: 0 %
Brady Statistic AP VS Percent: 0 %
Brady Statistic AS VP Percent: 0 %
Brady Statistic AS VS Percent: 100 %
Brady Statistic RA Percent Paced: 0 %
Brady Statistic RV Percent Paced: 0 %
Date Time Interrogation Session: 20201001073629
HighPow Impedance: 61 Ohm
HighPow Impedance: 85 Ohm
Implantable Lead Implant Date: 20130515
Implantable Lead Location: 753860
Implantable Lead Model: 7121
Implantable Pulse Generator Implant Date: 20130515
Lead Channel Impedance Value: 1102 Ohm
Lead Channel Impedance Value: 4047 Ohm
Lead Channel Impedance Value: 4047 Ohm
Lead Channel Impedance Value: 4047 Ohm
Lead Channel Pacing Threshold Amplitude: 0.875 V
Lead Channel Pacing Threshold Pulse Width: 0.4 ms
Lead Channel Sensing Intrinsic Amplitude: 0.5 mV
Lead Channel Sensing Intrinsic Amplitude: 0.5 mV
Lead Channel Sensing Intrinsic Amplitude: 11.5 mV
Lead Channel Sensing Intrinsic Amplitude: 11.5 mV
Lead Channel Setting Pacing Amplitude: 2.5 V
Lead Channel Setting Pacing Pulse Width: 0.4 ms
Lead Channel Setting Sensing Sensitivity: 0.3 mV

## 2019-03-22 ENCOUNTER — Encounter: Payer: Self-pay | Admitting: Cardiology

## 2019-03-22 ENCOUNTER — Other Ambulatory Visit: Payer: Self-pay

## 2019-03-22 NOTE — Progress Notes (Signed)
Remote ICD transmission.   

## 2019-04-05 ENCOUNTER — Other Ambulatory Visit (HOSPITAL_COMMUNITY): Payer: Self-pay

## 2019-04-05 MED ORDER — ENTRESTO 97-103 MG PO TABS: 1.0000 | ORAL_TABLET | Freq: Two times a day (BID) | ORAL | 2 refills | Status: DC

## 2019-04-08 ENCOUNTER — Telehealth: Payer: Self-pay

## 2019-04-09 ENCOUNTER — Telehealth (INDEPENDENT_AMBULATORY_CARE_PROVIDER_SITE_OTHER): Payer: Medicare Other | Admitting: Internal Medicine

## 2019-04-09 ENCOUNTER — Encounter: Payer: Self-pay | Admitting: Internal Medicine

## 2019-04-09 ENCOUNTER — Other Ambulatory Visit: Payer: Self-pay

## 2019-04-09 VITALS — Wt 387.0 lb

## 2019-04-09 DIAGNOSIS — I472 Ventricular tachycardia, unspecified: Secondary | ICD-10-CM

## 2019-04-09 DIAGNOSIS — I5042 Chronic combined systolic (congestive) and diastolic (congestive) heart failure: Secondary | ICD-10-CM | POA: Diagnosis not present

## 2019-04-09 DIAGNOSIS — I428 Other cardiomyopathies: Secondary | ICD-10-CM

## 2019-04-09 DIAGNOSIS — I10 Essential (primary) hypertension: Secondary | ICD-10-CM

## 2019-04-09 NOTE — Patient Instructions (Signed)
Medication Instructions:  Your physician recommends that you continue on your current medications as directed. Please refer to the Current Medication list given to you today.  Labwork: None ordered.  Testing/Procedures: None ordered.  Follow-Up: Your physician recommends that you schedule a follow-up appointment per remote checks.   We will be in contact with you when its time for your battery change.   Any Other Special Instructions Will Be Listed Below (If Applicable).     If you need a refill on your cardiac medications before your next appointment, please call your pharmacy.

## 2019-04-09 NOTE — Progress Notes (Signed)
Electrophysiology TeleHealth Note   Due to national recommendations of social distancing due to COVID 19, an audio/video telehealth visit is felt to be most appropriate for this patient at this time.  See MyChart message from today for the patient's consent to telehealth for [facility].   Date:  04/09/2019   ID:  DRAYCEN WEISHAUPT, DOB [birth year], MRN [id number]  Location: patient's home  Provider location: [address] [place]  Evaluation Performed: Follow-up visit  PCP:  Nolene Ebbs, MD  Cardiologist:   DB Electrophysiologist:  SK   Chief Complaint: Congestive heart failure with cardiomyopathy and implanted ICD approaching ERI  History of Present Illness:    William Davila is a 53 y.o. male who presents via audio/video conferencing for a telehealth visit today. The patient did not have access to video technology/had technical difficulties with video requiring transitioning to audio format only (telephone).  All issues noted in this document were discussed and addressed.  No physical exam could be performed with this format.    Since last being seen in our clinic, the patient reports doing well.  He is seeing [name] in the last month.  Underwent right heart and left heart cath as outlined below. Functional status is stable without chest pain.  Chronic edema and dyspnea.  No ICD discharges.  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 He has a history of nonsustained ventricular tachycardia.  But no sustained tachycardia no therapies  DATE TEST EF   3/13 Echo  20-25%   1/16 Echo 25-30%   1/18 Echo 20-25%   9/20 Echo 25-30%   9/20 LHC 25-30% Cors Normal     Date Cr K Dig  12/16 1.21 3.8   2/18 1.26 4.1 0.5  9/20 1.09 3.8 0/4 (2/20)    The patient denies symptoms of fevers, chills, cough, or new SOB worrisome for COVID 19.    Past Medical History:  Diagnosis Date  . AICD (automatic  cardioverter/defibrillator) present   . Arthritis    knee's  . Cardiomyopathy ([place])   . Chronic systolic heart failure ([place])    Diagnosed 2001; prior patient of Dr. Linard Millers with [name] - discharged from practice  . Dilated cardiomyopathy (HCC)    LVEF 10-20%  . Essential hypertension, benign   . Gout   . Hyperlipidemia   . Hypertension   . 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 Morbid obesity ([place])   . Presence of combination internal cardiac defibrillator (ICD) and pacemaker   . Presence of permanent cardiac pacemaker   . VT (ventricular tachycardia) ([place]) 06/07/2011    Past Surgical History:  Procedure Laterality Date  . BIOPSY  12/27/2017   Procedure: BIOPSY;  Surgeon: Yetta Flock, MD;  [facility]: [facility];  Service: Gastroenterology;;  . COLONOSCOPY WITH PROPOFOL N/A 12/27/2017   Procedure: COLONOSCOPY WITH PROPOFOL;  Surgeon: Yetta Flock, MD;  Location: [facility];  Service: Gastroenterology;  Laterality: N/A;  . ICD  10/27/2011  . IMPLANTABLE CARDIOVERTER DEFIBRILLATOR IMPLANT N/A 10/27/2011   Procedure: IMPLANTABLE CARDIOVERTER DEFIBRILLATOR IMPLANT;  Surgeon: Deboraha Sprang, MD;  Location: [facility] CATH LAB;  Service: Cardiovascular;  Laterality: N/A;  . POLYPECTOMY  12/27/2017   Procedure: POLYPECTOMY;  Surgeon: Yetta Flock, MD;  Location: [facility];  Service: Gastroenterology;;  . RIGHT/LEFT HEART CATH AND CORONARY ANGIOGRAPHY N/A 03/01/2019   Procedure: RIGHT/LEFT HEART CATH AND CORONARY ANGIOGRAPHY;  Surgeon: Jolaine Artist, MD;  Location: [facility] CV LAB;  Service: Cardiovascular;  Laterality: N/A;    Current Outpatient Medications  Medication Sig Dispense Refill  . acetaminophen (TYLENOL) 500 MG tablet Take 1,000 mg by mouth every 6 (six) hours as needed. For pain    . allopurinol (ZYLOPRIM) 300 MG tablet Take 300 mg by mouth daily.   1  . carvedilol (COREG) 25 MG tablet Take 1 tablet (25 mg total) by mouth 2  (two) times daily. 180 tablet 3  . colchicine 0.6 MG tablet Take 1 tablet by mouth daily as needed (gout).     Marland Kitchen digoxin (LANOXIN) 0.125 MG tablet TAKE 1 TABLET BY MOUTH DAILY 90 tablet 1  . naproxen (NAPROSYN) 500 MG tablet Take 500 mg by mouth 2 (two) times daily as needed (pain).    . potassium chloride SA (K-DUR) 20 MEQ tablet TAKE 1 TABLET(20 MEQ) BY MOUTH TWICE DAILY 60 tablet 3  . sacubitril-valsartan (ENTRESTO) 97-103 MG Take 1 tablet by mouth 2 (two) times daily. 60 tablet 2  . spironolactone (ALDACTONE) 25 MG tablet TAKE 1 TABLET BY MOUTH EVERY DAY 90 tablet 1  . torsemide (DEMADEX) 20 MG tablet TAKE 3 TABLETS BY MOUTH DAILY 270 tablet 1   No current facility-administered medications for this visit.     Allergies:   Beta adrenergic blockers and Isosorb dinitrate-hydralazine   Social History:  The patient  reports that he has never smoked. He has never used smokeless tobacco. He reports current alcohol use. He reports that he does not use drugs.   Family History:  The patient's   family history includes Hypertension in his mother.   ROS:  Please see the history of present illness.   All other systems are personally reviewed and negative.    Exam:    Vital Signs:  Wt (!) 387 lb (175.5 kg)   BMI 55.53 kg/m     Labs/Other Tests and Data Reviewed:    Recent Labs: 02/26/2019: BUN 18; Creatinine, Ser 1.09; Platelets 354 03/01/2019: Hemoglobin 12.9; Potassium 3.8; Sodium 142   Wt Readings from Last 3 Encounters:  04/09/19 (!) 387 lb (175.5 kg)  03/01/19 (!) 393 lb (178.3 kg)  02/26/19 (!) 393 lb 2 oz (178.3 kg)     Other studies personally reviewed: Additional studies/ records that were reviewed today include: As above  Last device remote is reviewed from [facility] PDF dated 10/20 which reveals normal device function,   arrhythmias - none   ASSESSMENT & PLAN:   NICM   ICD Medtronic single chamber   CHF   High Risk Medication Surveillance-aldactone //dig  Morbidly  obese     HTN  Functional status is stable.  Managed by heart failure clinic.  Device is approaching ERI with a battery voltage of 2.64.  Reminded him of the auditory alert.  Discussed briefly generator replacement, I think appropriate given his Messinger age.  Tolerating medications without questions    COVID 19 screen The patient denies symptoms of COVID 19 at this time.  The importance of social distancing was discussed today.  Follow-up:  Per remotes  Next remote: As above   Current medicines are reviewed at length with the patient today.   The patient does not have concerns regarding his medicines.  The following changes were made today:  none  Labs/ tests ordered today include:   No orders of the defined types were placed in this encounter.   Future tests ( post COVID )  Patient Risk:  after full review of this patients clinical status, I feel that they are at moderate risk at this time.  Today, I have spent 5  minutes with the patient with telehealth technology discussing the above.  Signed, Virl Axe, MD  04/09/2019 10:22 AM     [place] [address] [place] [address] [phone] (office) [phone] (fax)

## 2019-04-16 ENCOUNTER — Ambulatory Visit (INDEPENDENT_AMBULATORY_CARE_PROVIDER_SITE_OTHER): Payer: Medicare Other | Admitting: *Deleted

## 2019-04-16 DIAGNOSIS — I472 Ventricular tachycardia, unspecified: Secondary | ICD-10-CM

## 2019-04-16 DIAGNOSIS — I428 Other cardiomyopathies: Secondary | ICD-10-CM

## 2019-04-16 LAB — CUP PACEART REMOTE DEVICE CHECK
Battery Voltage: 2.64 V
Brady Statistic AP VP Percent: 0 %
Brady Statistic AP VS Percent: 0 %
Brady Statistic AS VP Percent: 0 %
Brady Statistic AS VS Percent: 100 %
Brady Statistic RA Percent Paced: 0 %
Brady Statistic RV Percent Paced: 0.01 %
Date Time Interrogation Session: 20201102072607
HighPow Impedance: 342 Ohm
HighPow Impedance: 68 Ohm
HighPow Impedance: 94 Ohm
Implantable Lead Implant Date: 20130515
Implantable Lead Location: 753860
Implantable Lead Model: 7121
Implantable Pulse Generator Implant Date: 20130515
Lead Channel Impedance Value: 1178 Ohm
Lead Channel Impedance Value: 4047 Ohm
Lead Channel Impedance Value: 4047 Ohm
Lead Channel Impedance Value: 4047 Ohm
Lead Channel Impedance Value: 4047 Ohm
Lead Channel Pacing Threshold Amplitude: 0.75 V
Lead Channel Pacing Threshold Pulse Width: 0.4 ms
Lead Channel Sensing Intrinsic Amplitude: 0.5 mV
Lead Channel Sensing Intrinsic Amplitude: 0.5 mV
Lead Channel Sensing Intrinsic Amplitude: 10.25 mV
Lead Channel Sensing Intrinsic Amplitude: 10.25 mV
Lead Channel Setting Pacing Amplitude: 2.5 V
Lead Channel Setting Pacing Pulse Width: 0.4 ms
Lead Channel Setting Sensing Sensitivity: 0.3 mV

## 2019-05-07 ENCOUNTER — Other Ambulatory Visit (HOSPITAL_COMMUNITY): Payer: Self-pay

## 2019-05-07 MED ORDER — TORSEMIDE 20 MG PO TABS: 60.0000 mg | ORAL_TABLET | Freq: Every day | ORAL | 1 refills | Status: DC

## 2019-05-14 NOTE — Progress Notes (Signed)
Remote ICD transmission.   

## 2019-05-15 ENCOUNTER — Ambulatory Visit (INDEPENDENT_AMBULATORY_CARE_PROVIDER_SITE_OTHER): Payer: Medicare Other | Admitting: *Deleted

## 2019-05-15 DIAGNOSIS — I5042 Chronic combined systolic (congestive) and diastolic (congestive) heart failure: Secondary | ICD-10-CM

## 2019-05-15 LAB — CUP PACEART REMOTE DEVICE CHECK
Battery Voltage: 2.64 V
Brady Statistic AP VP Percent: 0 %
Brady Statistic AP VS Percent: 0 %
Brady Statistic AS VP Percent: 0 %
Brady Statistic AS VS Percent: 100 %
Brady Statistic RA Percent Paced: 0 %
Brady Statistic RV Percent Paced: 0.01 %
Date Time Interrogation Session: 20201201033327
HighPow Impedance: 323 Ohm
HighPow Impedance: 62 Ohm
HighPow Impedance: 87 Ohm
Implantable Lead Implant Date: 20130515
Implantable Lead Location: 753860
Implantable Lead Model: 7121
Implantable Pulse Generator Implant Date: 20130515
Lead Channel Impedance Value: 1159 Ohm
Lead Channel Impedance Value: 4047 Ohm
Lead Channel Impedance Value: 4047 Ohm
Lead Channel Impedance Value: 4047 Ohm
Lead Channel Impedance Value: 4047 Ohm
Lead Channel Pacing Threshold Amplitude: 0.75 V
Lead Channel Pacing Threshold Pulse Width: 0.4 ms
Lead Channel Sensing Intrinsic Amplitude: 0.5 mV
Lead Channel Sensing Intrinsic Amplitude: 0.5 mV
Lead Channel Sensing Intrinsic Amplitude: 11 mV
Lead Channel Sensing Intrinsic Amplitude: 11 mV
Lead Channel Setting Pacing Amplitude: 2.5 V
Lead Channel Setting Pacing Pulse Width: 0.4 ms
Lead Channel Setting Sensing Sensitivity: 0.3 mV

## 2019-05-17 NOTE — Telephone Encounter (Signed)
Closed Encounter  °

## 2019-06-04 ENCOUNTER — Other Ambulatory Visit (HOSPITAL_COMMUNITY): Payer: Self-pay

## 2019-06-04 MED ORDER — DIGOXIN 125 MCG PO TABS: 125.0000 ug | ORAL_TABLET | Freq: Every day | ORAL | 1 refills | Status: DC

## 2019-06-04 MED ORDER — SPIRONOLACTONE 25 MG PO TABS: 25.0000 mg | ORAL_TABLET | Freq: Every day | ORAL | 1 refills | Status: DC

## 2019-06-09 NOTE — Progress Notes (Signed)
ICD remote 

## 2019-06-18 ENCOUNTER — Ambulatory Visit (INDEPENDENT_AMBULATORY_CARE_PROVIDER_SITE_OTHER): Payer: Medicare Other | Admitting: *Deleted

## 2019-06-18 DIAGNOSIS — I428 Other cardiomyopathies: Secondary | ICD-10-CM

## 2019-06-19 LAB — CUP PACEART REMOTE DEVICE CHECK
Battery Voltage: 2.63 V
Brady Statistic AP VP Percent: 0 %
Brady Statistic AP VS Percent: 0 %
Brady Statistic AS VP Percent: 0 %
Brady Statistic AS VS Percent: 100 %
Brady Statistic RA Percent Paced: 0 %
Brady Statistic RV Percent Paced: 0.01 %
Date Time Interrogation Session: 20210104033327
HighPow Impedance: 323 Ohm
HighPow Impedance: 66 Ohm
HighPow Impedance: 92 Ohm
Implantable Lead Implant Date: 20130515
Implantable Lead Location: 753860
Implantable Lead Model: 7121
Implantable Pulse Generator Implant Date: 20130515
Lead Channel Impedance Value: 1178 Ohm
Lead Channel Impedance Value: 4047 Ohm
Lead Channel Impedance Value: 4047 Ohm
Lead Channel Impedance Value: 4047 Ohm
Lead Channel Impedance Value: 4047 Ohm
Lead Channel Pacing Threshold Amplitude: 0.75 V
Lead Channel Pacing Threshold Pulse Width: 0.4 ms
Lead Channel Sensing Intrinsic Amplitude: 0.5 mV
Lead Channel Sensing Intrinsic Amplitude: 0.5 mV
Lead Channel Sensing Intrinsic Amplitude: 10.25 mV
Lead Channel Sensing Intrinsic Amplitude: 10.25 mV
Lead Channel Setting Pacing Amplitude: 2.5 V
Lead Channel Setting Pacing Pulse Width: 0.4 ms
Lead Channel Setting Sensing Sensitivity: 0.3 mV

## 2019-06-20 NOTE — Addendum Note (Signed)
Addended by: Oralia Rud on: 06/20/2019 07:50 AM   Modules accepted: Level of Service

## 2019-07-04 ENCOUNTER — Other Ambulatory Visit (HOSPITAL_COMMUNITY): Payer: Self-pay

## 2019-07-04 MED ORDER — POTASSIUM CHLORIDE CRYS ER 20 MEQ PO TBCR: EXTENDED_RELEASE_TABLET | ORAL | 3 refills | Status: DC

## 2019-07-04 MED ORDER — ENTRESTO 97-103 MG PO TABS: 1.0000 | ORAL_TABLET | Freq: Two times a day (BID) | ORAL | 2 refills | Status: DC

## 2019-07-19 ENCOUNTER — Ambulatory Visit (INDEPENDENT_AMBULATORY_CARE_PROVIDER_SITE_OTHER): Payer: Medicare Other | Admitting: *Deleted

## 2019-07-19 DIAGNOSIS — I428 Other cardiomyopathies: Secondary | ICD-10-CM

## 2019-07-19 LAB — CUP PACEART REMOTE DEVICE CHECK
Battery Voltage: 2.63 V
Brady Statistic AP VP Percent: 0 %
Brady Statistic AP VS Percent: 0 %
Brady Statistic AS VP Percent: 0 %
Brady Statistic AS VS Percent: 100 %
Brady Statistic RA Percent Paced: 0 %
Brady Statistic RV Percent Paced: 0.01 %
Date Time Interrogation Session: 20210204054349
HighPow Impedance: 342 Ohm
HighPow Impedance: 70 Ohm
HighPow Impedance: 97 Ohm
Implantable Lead Implant Date: 20130514200000
Implantable Lead Location: 753860
Implantable Lead Model: 7121
Implantable Pulse Generator Implant Date: 20130514200000
Lead Channel Impedance Value: 1273 Ohm
Lead Channel Impedance Value: 4047 Ohm
Lead Channel Impedance Value: 4047 Ohm
Lead Channel Impedance Value: 4047 Ohm
Lead Channel Impedance Value: 4047 Ohm
Lead Channel Pacing Threshold Amplitude: 0.75 V
Lead Channel Pacing Threshold Pulse Width: 0.4 ms
Lead Channel Sensing Intrinsic Amplitude: 0.5 mV
Lead Channel Sensing Intrinsic Amplitude: 0.5 mV
Lead Channel Sensing Intrinsic Amplitude: 12 mV
Lead Channel Sensing Intrinsic Amplitude: 12 mV
Lead Channel Setting Pacing Amplitude: 2.5 V
Lead Channel Setting Pacing Pulse Width: 0.4 ms
Lead Channel Setting Sensing Sensitivity: 0.3 mV

## 2019-08-20 ENCOUNTER — Ambulatory Visit (INDEPENDENT_AMBULATORY_CARE_PROVIDER_SITE_OTHER): Payer: Medicare Other | Admitting: *Deleted

## 2019-08-20 DIAGNOSIS — I428 Other cardiomyopathies: Secondary | ICD-10-CM | POA: Diagnosis not present

## 2019-08-20 LAB — CUP PACEART REMOTE DEVICE CHECK
Battery Voltage: 2.62 V
Brady Statistic AP VP Percent: 0 %
Brady Statistic AP VS Percent: 0 %
Brady Statistic AS VP Percent: 0 %
Brady Statistic AS VS Percent: 100 %
Brady Statistic RA Percent Paced: 0 %
Brady Statistic RV Percent Paced: 0.01 %
Date Time Interrogation Session: 20210308033327
HighPow Impedance: 342 Ohm
HighPow Impedance: 68 Ohm
HighPow Impedance: 92 Ohm
Implantable Lead Implant Date: 20130515
Implantable Lead Location: 753860
Implantable Lead Model: 7121
Implantable Pulse Generator Implant Date: 20130515
Lead Channel Impedance Value: 1235 Ohm
Lead Channel Impedance Value: 4047 Ohm
Lead Channel Impedance Value: 4047 Ohm
Lead Channel Impedance Value: 4047 Ohm
Lead Channel Impedance Value: 4047 Ohm
Lead Channel Pacing Threshold Amplitude: 0.875 V
Lead Channel Pacing Threshold Pulse Width: 0.4 ms
Lead Channel Sensing Intrinsic Amplitude: 0.5 mV
Lead Channel Sensing Intrinsic Amplitude: 0.5 mV
Lead Channel Sensing Intrinsic Amplitude: 11.625 mV
Lead Channel Sensing Intrinsic Amplitude: 11.625 mV
Lead Channel Setting Pacing Amplitude: 2.5 V
Lead Channel Setting Pacing Pulse Width: 0.4 ms
Lead Channel Setting Sensing Sensitivity: 0.3 mV

## 2019-08-21 NOTE — Progress Notes (Signed)
ICD Remote  

## 2019-09-03 ENCOUNTER — Other Ambulatory Visit (HOSPITAL_COMMUNITY): Payer: Self-pay

## 2019-09-03 DIAGNOSIS — I5022 Chronic systolic (congestive) heart failure: Secondary | ICD-10-CM

## 2019-09-03 MED ORDER — CARVEDILOL 25 MG PO TABS: 25.0000 mg | ORAL_TABLET | Freq: Two times a day (BID) | ORAL | 1 refills | Status: DC

## 2019-09-17 ENCOUNTER — Ambulatory Visit (INDEPENDENT_AMBULATORY_CARE_PROVIDER_SITE_OTHER): Payer: Medicare Other | Admitting: *Deleted

## 2019-09-17 DIAGNOSIS — I428 Other cardiomyopathies: Secondary | ICD-10-CM | POA: Diagnosis not present

## 2019-09-17 LAB — CUP PACEART REMOTE DEVICE CHECK
Battery Voltage: 2.62 V
Brady Statistic AP VP Percent: 0 %
Brady Statistic AP VS Percent: 0 %
Brady Statistic AS VP Percent: 0.05 %
Brady Statistic AS VS Percent: 99.95 %
Brady Statistic RA Percent Paced: 0 %
Brady Statistic RV Percent Paced: 0.01 %
Date Time Interrogation Session: 20210405022607
HighPow Impedance: 323 Ohm
HighPow Impedance: 62 Ohm
HighPow Impedance: 91 Ohm
Implantable Lead Implant Date: 20130515
Implantable Lead Location: 753860
Implantable Lead Model: 7121
Implantable Pulse Generator Implant Date: 20130515
Lead Channel Impedance Value: 1235 Ohm
Lead Channel Impedance Value: 4047 Ohm
Lead Channel Impedance Value: 4047 Ohm
Lead Channel Impedance Value: 4047 Ohm
Lead Channel Impedance Value: 4047 Ohm
Lead Channel Pacing Threshold Amplitude: 0.75 V
Lead Channel Pacing Threshold Pulse Width: 0.4 ms
Lead Channel Sensing Intrinsic Amplitude: 0.5 mV
Lead Channel Sensing Intrinsic Amplitude: 0.5 mV
Lead Channel Sensing Intrinsic Amplitude: 13.125 mV
Lead Channel Sensing Intrinsic Amplitude: 13.125 mV
Lead Channel Setting Pacing Amplitude: 2.5 V
Lead Channel Setting Pacing Pulse Width: 0.4 ms
Lead Channel Setting Sensing Sensitivity: 0.3 mV

## 2019-09-19 NOTE — Progress Notes (Signed)
ICD Remote  

## 2019-10-02 ENCOUNTER — Other Ambulatory Visit (HOSPITAL_COMMUNITY): Payer: Self-pay

## 2019-10-02 MED ORDER — POTASSIUM CHLORIDE CRYS ER 20 MEQ PO TBCR: EXTENDED_RELEASE_TABLET | ORAL | 5 refills | Status: DC

## 2019-10-04 ENCOUNTER — Other Ambulatory Visit (HOSPITAL_COMMUNITY): Payer: Self-pay

## 2019-10-04 MED ORDER — ENTRESTO 97-103 MG PO TABS: 1.0000 | ORAL_TABLET | Freq: Two times a day (BID) | ORAL | 5 refills | Status: DC

## 2019-10-22 ENCOUNTER — Ambulatory Visit (INDEPENDENT_AMBULATORY_CARE_PROVIDER_SITE_OTHER): Payer: Medicare Other | Admitting: *Deleted

## 2019-10-22 DIAGNOSIS — I472 Ventricular tachycardia, unspecified: Secondary | ICD-10-CM

## 2019-10-22 LAB — CUP PACEART REMOTE DEVICE CHECK
Battery Voltage: 2.61 V
Brady Statistic AP VP Percent: 0 %
Brady Statistic AP VS Percent: 0 %
Brady Statistic AS VP Percent: 0.04 %
Brady Statistic AS VS Percent: 99.96 %
Brady Statistic RA Percent Paced: 0 %
Brady Statistic RV Percent Paced: 0.01 %
Date Time Interrogation Session: 20210510033527
HighPow Impedance: 323 Ohm
HighPow Impedance: 63 Ohm
HighPow Impedance: 88 Ohm
Implantable Lead Implant Date: 20130515
Implantable Lead Location: 753860
Implantable Lead Model: 7121
Implantable Pulse Generator Implant Date: 20130515
Lead Channel Impedance Value: 1273 Ohm
Lead Channel Impedance Value: 4047 Ohm
Lead Channel Impedance Value: 4047 Ohm
Lead Channel Impedance Value: 4047 Ohm
Lead Channel Impedance Value: 4047 Ohm
Lead Channel Pacing Threshold Amplitude: 0.875 V
Lead Channel Pacing Threshold Pulse Width: 0.4 ms
Lead Channel Sensing Intrinsic Amplitude: 0.5 mV
Lead Channel Sensing Intrinsic Amplitude: 0.5 mV
Lead Channel Sensing Intrinsic Amplitude: 10.25 mV
Lead Channel Sensing Intrinsic Amplitude: 10.25 mV
Lead Channel Setting Pacing Amplitude: 2.5 V
Lead Channel Setting Pacing Pulse Width: 0.4 ms
Lead Channel Setting Sensing Sensitivity: 0.3 mV

## 2019-10-22 NOTE — Progress Notes (Signed)
Remote ICD transmission.   

## 2019-11-01 ENCOUNTER — Other Ambulatory Visit: Payer: Self-pay

## 2019-11-02 ENCOUNTER — Other Ambulatory Visit: Payer: Self-pay | Admitting: *Deleted

## 2019-11-05 MED ORDER — TORSEMIDE 20 MG PO TABS: 60.0000 mg | ORAL_TABLET | Freq: Every day | ORAL | 1 refills | Status: DC

## 2019-11-22 ENCOUNTER — Ambulatory Visit (INDEPENDENT_AMBULATORY_CARE_PROVIDER_SITE_OTHER): Payer: Medicare Other | Admitting: *Deleted

## 2019-11-22 DIAGNOSIS — I472 Ventricular tachycardia, unspecified: Secondary | ICD-10-CM

## 2019-11-22 LAB — CUP PACEART REMOTE DEVICE CHECK
Battery Voltage: 2.62 V
Brady Statistic AP VP Percent: 0 %
Brady Statistic AP VS Percent: 0 %
Brady Statistic AS VP Percent: 0 %
Brady Statistic AS VS Percent: 100 %
Brady Statistic RA Percent Paced: 0 %
Brady Statistic RV Percent Paced: 0 %
Date Time Interrogation Session: 20210610012307
HighPow Impedance: 323 Ohm
HighPow Impedance: 71 Ohm
HighPow Impedance: 93 Ohm
Implantable Lead Implant Date: 20130515
Implantable Lead Location: 753860
Implantable Lead Model: 7121
Implantable Pulse Generator Implant Date: 20130515
Lead Channel Impedance Value: 1292 Ohm
Lead Channel Impedance Value: 4047 Ohm
Lead Channel Impedance Value: 4047 Ohm
Lead Channel Impedance Value: 4047 Ohm
Lead Channel Impedance Value: 4047 Ohm
Lead Channel Pacing Threshold Amplitude: 0.75 V
Lead Channel Pacing Threshold Pulse Width: 0.4 ms
Lead Channel Sensing Intrinsic Amplitude: 0.5 mV
Lead Channel Sensing Intrinsic Amplitude: 0.5 mV
Lead Channel Sensing Intrinsic Amplitude: 12.5 mV
Lead Channel Sensing Intrinsic Amplitude: 12.5 mV
Lead Channel Setting Pacing Amplitude: 2.5 V
Lead Channel Setting Pacing Pulse Width: 0.4 ms
Lead Channel Setting Sensing Sensitivity: 0.3 mV

## 2019-11-23 NOTE — Progress Notes (Signed)
Remote ICD transmission.   

## 2019-12-03 ENCOUNTER — Other Ambulatory Visit (HOSPITAL_COMMUNITY): Payer: Self-pay | Admitting: *Deleted

## 2019-12-03 DIAGNOSIS — I10 Essential (primary) hypertension: Secondary | ICD-10-CM

## 2019-12-03 DIAGNOSIS — I472 Ventricular tachycardia, unspecified: Secondary | ICD-10-CM

## 2019-12-03 MED ORDER — SPIRONOLACTONE 25 MG PO TABS: 25.0000 mg | ORAL_TABLET | Freq: Every day | ORAL | 1 refills | Status: DC

## 2019-12-03 MED ORDER — DIGOXIN 125 MCG PO TABS: 125.0000 ug | ORAL_TABLET | Freq: Every day | ORAL | 1 refills | Status: DC

## 2019-12-04 ENCOUNTER — Other Ambulatory Visit (HOSPITAL_COMMUNITY): Payer: Self-pay | Admitting: *Deleted

## 2019-12-11 ENCOUNTER — Other Ambulatory Visit: Payer: Self-pay | Admitting: Internal Medicine

## 2019-12-18 ENCOUNTER — Ambulatory Visit (INDEPENDENT_AMBULATORY_CARE_PROVIDER_SITE_OTHER): Payer: Medicare Other | Admitting: *Deleted

## 2019-12-18 DIAGNOSIS — I472 Ventricular tachycardia, unspecified: Secondary | ICD-10-CM

## 2019-12-18 LAB — CUP PACEART REMOTE DEVICE CHECK
Battery Voltage: 2.61 V
Brady Statistic AP VP Percent: 0 %
Brady Statistic AP VS Percent: 0 %
Brady Statistic AS VP Percent: 0 %
Brady Statistic AS VS Percent: 100 %
Brady Statistic RA Percent Paced: 0 %
Brady Statistic RV Percent Paced: 0 %
Date Time Interrogation Session: 20210706044228
HighPow Impedance: 323 Ohm
HighPow Impedance: 64 Ohm
HighPow Impedance: 91 Ohm
Implantable Lead Implant Date: 20130515
Implantable Lead Location: 753860
Implantable Lead Model: 7121
Implantable Pulse Generator Implant Date: 20130515
Lead Channel Impedance Value: 1292 Ohm
Lead Channel Impedance Value: 4047 Ohm
Lead Channel Impedance Value: 4047 Ohm
Lead Channel Impedance Value: 4047 Ohm
Lead Channel Impedance Value: 4047 Ohm
Lead Channel Pacing Threshold Amplitude: 0.875 V
Lead Channel Pacing Threshold Pulse Width: 0.4 ms
Lead Channel Sensing Intrinsic Amplitude: 0.5 mV
Lead Channel Sensing Intrinsic Amplitude: 0.5 mV
Lead Channel Sensing Intrinsic Amplitude: 9.875 mV
Lead Channel Sensing Intrinsic Amplitude: 9.875 mV
Lead Channel Setting Pacing Amplitude: 2.5 V
Lead Channel Setting Pacing Pulse Width: 0.4 ms
Lead Channel Setting Sensing Sensitivity: 0.3 mV

## 2019-12-19 NOTE — Progress Notes (Signed)
Remote ICD transmission.   

## 2020-01-07 ENCOUNTER — Telehealth: Payer: Self-pay

## 2020-01-07 NOTE — Telephone Encounter (Signed)
Received alert device has reached RRT 01/06/20.   Called patient to discuss and schedule for apt. No answer, LMOVM.

## 2020-01-07 NOTE — Telephone Encounter (Signed)
Patient informed battery has reached RRT. Advised patient I will forward this information to the scheduler and she will call to set up apt. With Dr. Caryl Comes to discuss gen change. Agrees, verbalizes understanding.

## 2020-01-22 ENCOUNTER — Ambulatory Visit (INDEPENDENT_AMBULATORY_CARE_PROVIDER_SITE_OTHER): Payer: Medicare Other | Admitting: *Deleted

## 2020-01-22 DIAGNOSIS — I472 Ventricular tachycardia, unspecified: Secondary | ICD-10-CM

## 2020-01-22 LAB — CUP PACEART REMOTE DEVICE CHECK
Battery Voltage: 2.61 V
Brady Statistic AP VP Percent: 0 %
Brady Statistic AP VS Percent: 0 %
Brady Statistic AS VP Percent: 0 %
Brady Statistic AS VS Percent: 100 %
Brady Statistic RA Percent Paced: 0 %
Brady Statistic RV Percent Paced: 0 %
Date Time Interrogation Session: 20210810033328
HighPow Impedance: 342 Ohm
HighPow Impedance: 65 Ohm
HighPow Impedance: 91 Ohm
Implantable Lead Implant Date: 20130515
Implantable Lead Location: 753860
Implantable Lead Model: 7121
Implantable Pulse Generator Implant Date: 20130515
Lead Channel Impedance Value: 1311 Ohm
Lead Channel Impedance Value: 4047 Ohm
Lead Channel Impedance Value: 4047 Ohm
Lead Channel Impedance Value: 4047 Ohm
Lead Channel Impedance Value: 4047 Ohm
Lead Channel Pacing Threshold Amplitude: 0.875 V
Lead Channel Pacing Threshold Pulse Width: 0.4 ms
Lead Channel Sensing Intrinsic Amplitude: 0.5 mV
Lead Channel Sensing Intrinsic Amplitude: 0.5 mV
Lead Channel Sensing Intrinsic Amplitude: 9.875 mV
Lead Channel Sensing Intrinsic Amplitude: 9.875 mV
Lead Channel Setting Pacing Amplitude: 2.5 V
Lead Channel Setting Pacing Pulse Width: 0.4 ms
Lead Channel Setting Sensing Sensitivity: 0.3 mV

## 2020-01-24 NOTE — Progress Notes (Signed)
Remote ICD transmission.   

## 2020-01-30 DIAGNOSIS — Z95810 Presence of automatic (implantable) cardiac defibrillator: Secondary | ICD-10-CM | POA: Insufficient documentation

## 2020-01-31 ENCOUNTER — Other Ambulatory Visit: Payer: Self-pay

## 2020-01-31 ENCOUNTER — Telehealth (INDEPENDENT_AMBULATORY_CARE_PROVIDER_SITE_OTHER): Payer: Medicare Other | Admitting: Internal Medicine

## 2020-01-31 ENCOUNTER — Telehealth: Payer: Self-pay

## 2020-01-31 VITALS — Ht 70.0 in

## 2020-01-31 DIAGNOSIS — Z95810 Presence of automatic (implantable) cardiac defibrillator: Secondary | ICD-10-CM

## 2020-01-31 DIAGNOSIS — I428 Other cardiomyopathies: Secondary | ICD-10-CM | POA: Diagnosis not present

## 2020-01-31 DIAGNOSIS — I472 Ventricular tachycardia, unspecified: Secondary | ICD-10-CM

## 2020-01-31 DIAGNOSIS — Z01812 Encounter for preprocedural laboratory examination: Secondary | ICD-10-CM

## 2020-01-31 DIAGNOSIS — I5042 Chronic combined systolic (congestive) and diastolic (congestive) heart failure: Secondary | ICD-10-CM | POA: Diagnosis not present

## 2020-01-31 NOTE — Telephone Encounter (Signed)
  Patient Consent for Virtual Visit         WAYMON LASER has provided verbal consent on 01/31/2020 for a virtual visit (video or telephone).   CONSENT FOR VIRTUAL VISIT FOR:  William Davila  By participating in this virtual visit I agree to the following:  I hereby voluntarily request, consent and authorize [facility] and its employed or contracted physicians, physician assistants, nurse practitioners or other licensed health care professionals (the Practitioner), to provide me with telemedicine health care services (the "Services") as deemed necessary by the treating Practitioner. I acknowledge and consent to receive the Services by the Practitioner via telemedicine. I understand that the telemedicine visit will involve communicating with the Practitioner through live audiovisual communication technology and the disclosure of certain medical information by electronic transmission. I acknowledge that I have been given the opportunity to request an in-person assessment or other available alternative prior to the telemedicine visit and am voluntarily participating in the telemedicine visit.  I understand that I have the right to withhold or withdraw my consent to the use of telemedicine in the course of my care at any time, without affecting my right to future care or treatment, and that the Practitioner or I may terminate the telemedicine visit at any time. I understand that I have the right to inspect all information obtained and/or recorded in the course of the telemedicine visit and may receive copies of available information for a reasonable fee.  I understand that some of the potential risks of receiving the Services via telemedicine include:  Marland Kitchen Delay or interruption in medical evaluation due to technological equipment failure or disruption; . Information transmitted may not be sufficient (e.g. poor resolution of images) to allow for appropriate medical decision making by the Practitioner;  and/or  . In rare instances, security protocols could fail, causing a breach of personal health information.  Furthermore, I acknowledge that it is my responsibility to provide information about my medical history, conditions and care that is complete and accurate to the best of my ability. I acknowledge that Practitioner's advice, recommendations, and/or decision may be based on factors not within their control, such as incomplete or inaccurate data provided by me or distortions of diagnostic images or specimens that may result from electronic transmissions. I understand that the practice of medicine is not an exact science and that Practitioner makes no warranties or guarantees regarding treatment outcomes. I acknowledge that a copy of this consent can be made available to me via my patient portal ([facility]), or I can request a printed copy by calling the office of [facility].    I understand that my insurance will be billed for this visit.   I have read or had this consent read to me. . I understand the contents of this consent, which adequately explains the benefits and risks of the Services being provided via telemedicine.  . I have been provided ample opportunity to ask questions regarding this consent and the Services and have had my questions answered to my satisfaction. . I give my informed consent for the services to be provided through the use of telemedicine in my medical care

## 2020-01-31 NOTE — Patient Instructions (Signed)
Medication Instructions:  Your physician recommends that you continue on your current medications as directed. Please refer to the Current Medication list given to you today.  *If you need a refill on your cardiac medications before your next appointment, please call your pharmacy*   Lab Work: Pre-Op CBC, BMET, and Dig level to be scheduled  If you have labs (blood work) drawn today and your tests are completely normal, you will receive your results only by: Marland Kitchen MyChart Message (if you have MyChart) OR . A paper copy in the mail If you have any lab test that is abnormal or we need to change your treatment, we will call you to review the results.   Testing/Procedures: ICD Generator change to be scheduled   Follow-Up: At [facility], you and your health needs are our priority.  As part of our continuing mission to provide you with exceptional heart care, we have created designated Provider Care Teams.  These Care Teams include your primary Cardiologist (physician) and Advanced Practice Providers (APPs -  Physician Assistants and Nurse Practitioners) who all work together to provide you with the care you need, when you need it.  We recommend signing up for the patient portal called "MyChart".  Sign up information is provided on this After Visit Summary.  MyChart is used to connect with patients for Virtual Visits (Telemedicine).  Patients are able to view lab/test results, encounter notes, upcoming appointments, etc.  Non-urgent messages can be sent to your provider as well.   To learn more about what you can do with MyChart, go to [email or web address].    Your next appointment:  Follow up appointments to be scheduled - You will be contacted by Dr Olin Pia scheduler.

## 2020-01-31 NOTE — H&P (View-Only) (Signed)
Electrophysiology TeleHealth Note   Due to national recommendations of social distancing due to COVID 19, an audio/video telehealth visit is felt to be most appropriate for this patient at this time.  See MyChart message from today for the patient's consent to telehealth for [facility].   Date:  01/31/2020   ID:  CAIDE [name], DOB [birth year], MRN [id number]  Location: patient's home  Provider location: [address], [place]  Evaluation Performed: Follow-up visit  PCP:  Nolene Ebbs, MD  Cardiologist:   Electrophysiologist:  SK   Chief Complaint:  ICD   History of Present Illness:    William Davila is a 54 y.o. male who presents via audio/video conferencing for a telehealth visit today.  Since last being seen in our clinic ICD implanted of NICM and with VT, the patient reports no dyspnea, no intercurrent VT Device at [facility] and beeping   No edema, no PND   DATE TEST EF   3/13 Echo  20-25%   1/16 Echo 25-30%   1/18 Echo 20-25%   9/20 Echo 25-30%   9/20 LHC 25-30% Cors Normal          Date Cr K Dig  12/16 1.21 3.8   2/18 1.26 4.1 0.5  9/20 1.09 3.8 0/4 (2/20)          The patient denies symptoms of fevers, chills, cough, or new SOB worrisome for COVID 19.    Past Medical History:  Diagnosis Date  . AICD (automatic cardioverter/defibrillator) present   . Arthritis    knee's  . Cardiomyopathy ([place])   . Chronic systolic heart failure ([place])    Diagnosed 2001; prior patient of Dr. Linard Millers with [name] - discharged from practice  . Dilated cardiomyopathy (HCC)    LVEF 10-20%  . Essential hypertension, benign   . Gout   . Hyperlipidemia   . Hypertension   . 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 Morbid obesity ([place])   . Presence of combination internal cardiac defibrillator (ICD) and pacemaker   . Presence of permanent cardiac pacemaker   . VT (ventricular tachycardia) ([place]) 06/07/2011    Past Surgical  History:  Procedure Laterality Date  . BIOPSY  12/27/2017   Procedure: BIOPSY;  Surgeon: Yetta Flock, MD;  Location: [facility];  Service: Gastroenterology;;  . COLONOSCOPY WITH PROPOFOL N/A 12/27/2017   Procedure: COLONOSCOPY WITH PROPOFOL;  Surgeon: Yetta Flock, MD;  Location: [facility];  Service: Gastroenterology;  Laterality: N/A;  . ICD  10/27/2011  . IMPLANTABLE CARDIOVERTER DEFIBRILLATOR IMPLANT N/A 10/27/2011   Procedure: IMPLANTABLE CARDIOVERTER DEFIBRILLATOR IMPLANT;  Surgeon: Deboraha Sprang, MD;  Location: [facility] CATH LAB;  Service: Cardiovascular;  Laterality: N/A;  . POLYPECTOMY  12/27/2017   Procedure: POLYPECTOMY;  Surgeon: Yetta Flock, MD;  Location: [facility];  Service: Gastroenterology;;  . RIGHT/LEFT HEART CATH AND CORONARY ANGIOGRAPHY N/A 03/01/2019   Procedure: RIGHT/LEFT HEART CATH AND CORONARY ANGIOGRAPHY;  Surgeon: Jolaine Artist, MD;  Location: [facility] CV LAB;  Service: Cardiovascular;  Laterality: N/A;    Current Outpatient Medications  Medication Sig Dispense Refill  . acetaminophen (TYLENOL) 500 MG tablet Take 1,000 mg by mouth every 6 (six) hours as needed. For pain    . allopurinol (ZYLOPRIM) 300 MG tablet Take 300 mg by mouth daily.   1  . carvedilol (COREG) 25 MG tablet Take 1 tablet (25 mg total) by mouth 2 (two) times daily. 180 tablet  1  . colchicine 0.6 MG tablet Take 1 tablet by mouth daily as needed (gout).     Marland Kitchen digoxin (LANOXIN) 0.125 MG tablet Take 1 tablet (125 mcg total) by mouth daily. 90 tablet 1  . naproxen (NAPROSYN) 500 MG tablet Take 500 mg by mouth 2 (two) times daily as needed (pain).    . potassium chloride SA (KLOR-CON) 20 MEQ tablet TAKE 1 TABLET(20 MEQ) BY MOUTH TWICE DAILY 60 tablet 5  . sacubitril-valsartan (ENTRESTO) 97-103 MG Take 1 tablet by mouth 2 (two) times daily. 60 tablet 5  . spironolactone (ALDACTONE) 25 MG tablet Take 1 tablet (25 mg total) by mouth daily. 90 tablet 1  . torsemide  (DEMADEX) 20 MG tablet Take 3 tablets (60 mg total) by mouth daily. 270 tablet 1   No current facility-administered medications for this visit.    Allergies:   Beta adrenergic blockers and Isosorb dinitrate-hydralazine   Social History:  The patient  reports that he has never smoked. He has never used smokeless tobacco. He reports current alcohol use. He reports that he does not use drugs.   Family History:  The patient's   family history includes Hypertension in his mother.   ROS:  Please see the history of present illness.   All other systems are personally reviewed and negative.    Exam:    Vital Signs:  Ht 5\' 10"  (1.778 m)   BMI 55.53 kg/m     Labs/Other Tests and Data Reviewed:    Recent Labs: 02/26/2019: BUN 18; Creatinine, Ser 1.09; Platelets 354 03/01/2019: Hemoglobin 12.9; Potassium 3.8; Sodium 142   Wt Readings from Last 3 Encounters:  04/09/19 (!) 387 lb (175.5 kg)  03/01/19 (!) 393 lb (178.3 kg)  02/26/19 (!) 393 lb 2 oz (178.3 kg)     Other studies personally reviewed: Additional studies/ records that were reviewed today include:As above   Last device remote is reviewed from [facility] PDF dated *8/21 which reveals normal device function,   arrhythmias - none    ecg 9/20 >> IVCD   ASSESSMENT & PLAN:   NICM   ICD Medtronic single chamber   CHF   High Risk Medication Surveillance-aldactone //dig   Morbidly obese   HTN   BP well controlled at home  Device at [facility] and discussed gen change   Euvolemic continue current meds  No intercurrent Ventricular tachycardia   COVID 19 screen The patient denies symptoms of COVID 19 at this time.  The importance of social distancing was discussed today.  Follow-up: DB 5m  SK 12 m Next remote: As Scheduled   Current medicines are reviewed at length with the patient today.   The patient does not have concerns regarding his medicines.  The following changes were made today:  none  Labs/ tests ordered  today include: include  Dig w preop labs  No orders of the defined types were placed in this encounter.   Future tests ( post COVID )     Patient Risk:  after full review of this patients clinical status, I feel that they are at moderate risk at this time.  Today, I have spent 13  minutes with the patient with telehealth technology discussing the above.  Signed, Virl Axe, MD  01/31/2020 2:52 PM     [place] [address] [place] [address] [phone] (office) [phone] (fax)

## 2020-01-31 NOTE — Progress Notes (Signed)
Electrophysiology TeleHealth Note   Due to national recommendations of social distancing due to COVID 19, an audio/video telehealth visit is felt to be most appropriate for this patient at this time.  See MyChart message from today for the patient's consent to telehealth for [facility].   Date:  01/31/2020   ID:  William Davila, DOB [birth year], MRN [id number]  Location: patient's home  Provider location: [address], [place]  Evaluation Performed: Follow-up visit  PCP:  Nolene Ebbs, MD  Cardiologist:   Electrophysiologist:  SK   Chief Complaint:  ICD   History of Present Illness:    William Davila is a 54 y.o. male who presents via audio/video conferencing for a telehealth visit today.  Since last being seen in our clinic ICD implanted of NICM and with VT, the patient reports no dyspnea, no intercurrent VT Device at [facility] and beeping   No edema, no PND   DATE TEST EF   3/13 Echo  20-25%   1/16 Echo 25-30%   1/18 Echo 20-25%   9/20 Echo 25-30%   9/20 LHC 25-30% Cors Normal          Date Cr K Dig  12/16 1.21 3.8   2/18 1.26 4.1 0.5  9/20 1.09 3.8 0/4 (2/20)          The patient denies symptoms of fevers, chills, cough, or new SOB worrisome for COVID 19.    Past Medical History:  Diagnosis Date  . AICD (automatic cardioverter/defibrillator) present   . Arthritis    knee's  . Cardiomyopathy ([place])   . Chronic systolic heart failure ([place])    Diagnosed 2001; prior patient of Dr. Linard Millers with [name] - discharged from practice  . Dilated cardiomyopathy (HCC)    LVEF 10-20%  . Essential hypertension, benign   . Gout   . Hyperlipidemia   . Hypertension   . 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 Morbid obesity ([place])   . Presence of combination internal cardiac defibrillator (ICD) and pacemaker   . Presence of permanent cardiac pacemaker   . VT (ventricular tachycardia) ([place]) 06/07/2011    Past Surgical  History:  Procedure Laterality Date  . BIOPSY  12/27/2017   Procedure: BIOPSY;  Surgeon: Yetta Flock, MD;  [facility];  Service: Gastroenterology;;  . COLONOSCOPY WITH PROPOFOL N/A 12/27/2017   Procedure: COLONOSCOPY WITH PROPOFOL;  Surgeon: Yetta Flock, MD;  [facility];  Service: Gastroenterology;  Laterality: N/A;  . ICD  10/27/2011  . IMPLANTABLE CARDIOVERTER DEFIBRILLATOR IMPLANT N/A 10/27/2011   Procedure: IMPLANTABLE CARDIOVERTER DEFIBRILLATOR IMPLANT;  Surgeon: Deboraha Sprang, MD;  Location: [facility] CATH LAB;  Service: Cardiovascular;  Laterality: N/A;  . POLYPECTOMY  12/27/2017   Procedure: POLYPECTOMY;  Surgeon: Yetta Flock, MD;  [facility];  Service: Gastroenterology;;  . RIGHT/LEFT HEART CATH AND CORONARY ANGIOGRAPHY N/A 03/01/2019   Procedure: RIGHT/LEFT HEART CATH AND CORONARY ANGIOGRAPHY;  Surgeon: Jolaine Artist, MD;  Location: [facility] CV LAB;  Service: Cardiovascular;  Laterality: N/A;    Current Outpatient Medications  Medication Sig Dispense Refill  . acetaminophen (TYLENOL) 500 MG tablet Take 1,000 mg by mouth every 6 (six) hours as needed. For pain    . allopurinol (ZYLOPRIM) 300 MG tablet Take 300 mg by mouth daily.   1  . carvedilol (COREG) 25 MG tablet Take 1 tablet (25 mg total) by mouth 2 (two) times daily. 180 tablet  1  . colchicine 0.6 MG tablet Take 1 tablet by mouth daily as needed (gout).     Marland Kitchen digoxin (LANOXIN) 0.125 MG tablet Take 1 tablet (125 mcg total) by mouth daily. 90 tablet 1  . naproxen (NAPROSYN) 500 MG tablet Take 500 mg by mouth 2 (two) times daily as needed (pain).    . potassium chloride SA (KLOR-CON) 20 MEQ tablet TAKE 1 TABLET(20 MEQ) BY MOUTH TWICE DAILY 60 tablet 5  . sacubitril-valsartan (ENTRESTO) 97-103 MG Take 1 tablet by mouth 2 (two) times daily. 60 tablet 5  . spironolactone (ALDACTONE) 25 MG tablet Take 1 tablet (25 mg total) by mouth daily. 90 tablet 1  . torsemide  (DEMADEX) 20 MG tablet Take 3 tablets (60 mg total) by mouth daily. 270 tablet 1   No current facility-administered medications for this visit.    Allergies:   Beta adrenergic blockers and Isosorb dinitrate-hydralazine   Social History:  The patient  reports that he has never smoked. He has never used smokeless tobacco. He reports current alcohol use. He reports that he does not use drugs.   Family History:  The patient's   family history includes Hypertension in his mother.   ROS:  Please see the history of present illness.   All other systems are personally reviewed and negative.    Exam:    Vital Signs:  Ht 5\' 10"  (1.778 m)   BMI 55.53 kg/m     Labs/Other Tests and Data Reviewed:    Recent Labs: 02/26/2019: BUN 18; Creatinine, Ser 1.09; Platelets 354 03/01/2019: Hemoglobin 12.9; Potassium 3.8; Sodium 142   Wt Readings from Last 3 Encounters:  04/09/19 (!) 387 lb (175.5 kg)  03/01/19 (!) 393 lb (178.3 kg)  02/26/19 (!) 393 lb 2 oz (178.3 kg)     Other studies personally reviewed: Additional studies/ records that were reviewed today include:As above   Last device remote is reviewed from [place] PDF dated *8/21 which reveals normal device function,   arrhythmias - none    ecg 9/20 >> IVCD   ASSESSMENT & PLAN:   NICM   ICD Medtronic single chamber   CHF   High Risk Medication Surveillance-aldactone //dig   Morbidly obese   HTN   BP well controlled at home  Device at [facility] and discussed gen change   Euvolemic continue current meds  No intercurrent Ventricular tachycardia   COVID 19 screen The patient denies symptoms of COVID 19 at this time.  The importance of social distancing was discussed today.  Follow-up: DB 76m  SK 12 m Next remote: As Scheduled   Current medicines are reviewed at length with the patient today.   The patient does not have concerns regarding his medicines.  The following changes were made today:  none  Labs/ tests ordered  today include: include  Dig w preop labs  No orders of the defined types were placed in this encounter.   Future tests ( post COVID )     Patient Risk:  after full review of this patients clinical status, I feel that they are at moderate risk at this time.  Today, I have spent 13  minutes with the patient with telehealth technology discussing the above.  Signed, Virl Axe, MD  01/31/2020 2:52 PM     [facility] [address] [place] [address] [phone] (office) [phone] (fax)

## 2020-02-01 ENCOUNTER — Telehealth: Payer: Self-pay | Admitting: Internal Medicine

## 2020-02-01 NOTE — Telephone Encounter (Signed)
Patient is requesting to schedule ICD gen change as discuss during virtual appointment on 01/31/20. Please assist.

## 2020-02-01 NOTE — Telephone Encounter (Signed)
Spoke with pt re: date availability for ICD change out.  Pt requests September 10,2021.  Pt advised once procedure and pre-procedure testing is scheduled he will receive a return call from RN to review instructions.  Pt verbalizes understanding and agrees with current plan.

## 2020-02-06 NOTE — Telephone Encounter (Signed)
Patient states he is returning a call. May be in regards to procedure on 02/22/20.

## 2020-02-06 NOTE — Telephone Encounter (Signed)
Spoke with pt and reviewed Device Instruction letter for generator change scheduled for 02/22/2020 with Dr Caryl Comes.  See letter for complete details.  Pt verbalizes understanding and agrees with current plan.

## 2020-02-18 LAB — CUP PACEART REMOTE DEVICE CHECK
Battery Voltage: 2.6 V
Brady Statistic AP VP Percent: 0 %
Brady Statistic AP VS Percent: 0 %
Brady Statistic AS VP Percent: 0 %
Brady Statistic AS VS Percent: 100 %
Brady Statistic RA Percent Paced: 0 %
Brady Statistic RV Percent Paced: 0 %
Date Time Interrogation Session: 20210906033528
HighPow Impedance: 323 Ohm
HighPow Impedance: 63 Ohm
HighPow Impedance: 91 Ohm
Implantable Lead Implant Date: 20130515
Implantable Lead Location: 753860
Implantable Lead Model: 7121
Implantable Pulse Generator Implant Date: 20130515
Lead Channel Impedance Value: 1311 Ohm
Lead Channel Impedance Value: 4047 Ohm
Lead Channel Impedance Value: 4047 Ohm
Lead Channel Impedance Value: 4047 Ohm
Lead Channel Impedance Value: 4047 Ohm
Lead Channel Pacing Threshold Amplitude: 0.875 V
Lead Channel Pacing Threshold Pulse Width: 0.4 ms
Lead Channel Sensing Intrinsic Amplitude: 0.5 mV
Lead Channel Sensing Intrinsic Amplitude: 0.5 mV
Lead Channel Sensing Intrinsic Amplitude: 10.5 mV
Lead Channel Sensing Intrinsic Amplitude: 10.5 mV
Lead Channel Setting Pacing Amplitude: 2.5 V
Lead Channel Setting Pacing Pulse Width: 0.4 ms
Lead Channel Setting Sensing Sensitivity: 0.3 mV

## 2020-02-19 ENCOUNTER — Other Ambulatory Visit (HOSPITAL_COMMUNITY)
Admission: RE | Admit: 2020-02-19 | Discharge: 2020-02-19 | Disposition: A | Discharge status: Home / Self Care | Payer: Medicare Other | Source: Ambulatory Visit | Attending: Internal Medicine | Admitting: Internal Medicine

## 2020-02-19 ENCOUNTER — Other Ambulatory Visit: Payer: Self-pay

## 2020-02-19 ENCOUNTER — Other Ambulatory Visit: Payer: Medicare Other | Admitting: *Deleted

## 2020-02-19 DIAGNOSIS — Z01812 Encounter for preprocedural laboratory examination: Secondary | ICD-10-CM | POA: Insufficient documentation

## 2020-02-19 DIAGNOSIS — I5042 Chronic combined systolic (congestive) and diastolic (congestive) heart failure: Secondary | ICD-10-CM

## 2020-02-19 DIAGNOSIS — I428 Other cardiomyopathies: Secondary | ICD-10-CM

## 2020-02-19 DIAGNOSIS — Z20822 Contact with and (suspected) exposure to covid-19: Secondary | ICD-10-CM | POA: Insufficient documentation

## 2020-02-19 DIAGNOSIS — Z95810 Presence of automatic (implantable) cardiac defibrillator: Secondary | ICD-10-CM

## 2020-02-20 LAB — BASIC METABOLIC PANEL
BUN/Creatinine Ratio: 13 (ref 9–20)
BUN: 16 mg/dL (ref 6–24)
CO2: 25 mmol/L (ref 20–29)
Calcium: 9.1 mg/dL (ref 8.7–10.2)
Chloride: 102 mmol/L (ref 96–106)
Creatinine, Ser: 1.28 mg/dL — ABNORMAL HIGH (ref 0.76–1.27)
GFR calc Af Amer: 74 mL/min/{1.73_m2} (ref >59)
GFR calc non Af Amer: 64 mL/min/{1.73_m2} (ref >59)
Glucose: 148 mg/dL — ABNORMAL HIGH (ref 65–99)
Potassium: 4.5 mmol/L (ref 3.5–5.2)
Sodium: 138 mmol/L (ref 134–144)

## 2020-02-20 LAB — CBC
Hematocrit: 40.7 % (ref 37.5–51.0)
Hemoglobin: 13.6 g/dL (ref 13.0–17.7)
MCH: 30 pg (ref 26.6–33.0)
MCHC: 33.4 g/dL (ref 31.5–35.7)
MCV: 90 fL (ref 79–97)
Platelets: 413 10*3/uL (ref 150–450)
RBC: 4.54 x10E6/uL (ref 4.14–5.80)
RDW: 14.4 % (ref 11.6–15.4)
WBC: 11.8 10*3/uL — ABNORMAL HIGH (ref 3.4–10.8)

## 2020-02-20 LAB — SARS CORONAVIRUS 2 (TAT 6-24 HRS): SARS Coronavirus 2: NEGATIVE

## 2020-02-20 LAB — DIGOXIN LEVEL: Digoxin, Serum: 0.7 ng/mL (ref 0.5–0.9)

## 2020-02-21 NOTE — Progress Notes (Signed)
Attempted to call patient regarding procedure for tomorrow. No answer.

## 2020-02-22 ENCOUNTER — Ambulatory Visit (HOSPITAL_COMMUNITY)
Admission: RE | Disposition: A | Discharge status: Home / Self Care | Payer: Medicare Other | Source: Home / Self Care | Attending: Internal Medicine

## 2020-02-22 ENCOUNTER — Ambulatory Visit (HOSPITAL_COMMUNITY)
Admission: RE | Admit: 2020-02-22 | Discharge: 2020-02-22 | Disposition: A | Discharge status: Home / Self Care | Payer: Medicare Other | Attending: Internal Medicine | Admitting: Internal Medicine

## 2020-02-22 ENCOUNTER — Other Ambulatory Visit: Payer: Self-pay

## 2020-02-22 ENCOUNTER — Encounter (HOSPITAL_COMMUNITY): Payer: Self-pay | Admitting: Internal Medicine

## 2020-02-22 DIAGNOSIS — I110 Hypertensive heart disease with heart failure: Secondary | ICD-10-CM | POA: Diagnosis not present

## 2020-02-22 DIAGNOSIS — I5022 Chronic systolic (congestive) heart failure: Secondary | ICD-10-CM | POA: Diagnosis not present

## 2020-02-22 DIAGNOSIS — Z4502 Encounter for adjustment and management of automatic implantable cardiac defibrillator: Secondary | ICD-10-CM | POA: Diagnosis not present

## 2020-02-22 DIAGNOSIS — Z79899 Other long term (current) drug therapy: Secondary | ICD-10-CM | POA: Diagnosis not present

## 2020-02-22 DIAGNOSIS — Z6841 Body Mass Index (BMI) 40.0 and over, adult: Secondary | ICD-10-CM | POA: Insufficient documentation

## 2020-02-22 DIAGNOSIS — E785 Hyperlipidemia, unspecified: Secondary | ICD-10-CM | POA: Diagnosis not present

## 2020-02-22 DIAGNOSIS — M109 Gout, unspecified: Secondary | ICD-10-CM | POA: Insufficient documentation

## 2020-02-22 DIAGNOSIS — I429 Cardiomyopathy, unspecified: Secondary | ICD-10-CM | POA: Diagnosis not present

## 2020-02-22 DIAGNOSIS — I420 Dilated cardiomyopathy: Secondary | ICD-10-CM | POA: Diagnosis not present

## 2020-02-22 DIAGNOSIS — Z45010 Encounter for checking and testing of cardiac pacemaker pulse generator [battery]: Secondary | ICD-10-CM

## 2020-02-22 HISTORY — PX: ICD GENERATOR CHANGEOUT: EP1231

## 2020-02-22 SURGERY — ICD GENERATOR CHANGEOUT

## 2020-02-22 MED ORDER — ONDANSETRON HCL 4 MG/2ML IJ SOLN: 4.0000 mg | Freq: Four times a day (QID) | INTRAMUSCULAR | Status: DC | PRN

## 2020-02-22 MED ORDER — CEFAZOLIN SODIUM-DEXTROSE 2-4 GM/100ML-% IV SOLN
INTRAVENOUS | Status: AC
  Filled 2020-02-22: qty 100

## 2020-02-22 MED ORDER — SODIUM CHLORIDE 0.9 % IV SOLN
INTRAVENOUS | Status: AC
  Filled 2020-02-22: qty 2

## 2020-02-22 MED ORDER — MIDAZOLAM HCL 5 MG/5ML IJ SOLN
INTRAMUSCULAR | Status: DC | PRN
  Administered 2020-02-22: 3 mg via INTRAVENOUS
  Administered 2020-02-22: 1 mg via INTRAVENOUS

## 2020-02-22 MED ORDER — MIDAZOLAM HCL 5 MG/5ML IJ SOLN
INTRAMUSCULAR | Status: AC
  Filled 2020-02-22: qty 5

## 2020-02-22 MED ORDER — SODIUM CHLORIDE 0.9 % IV SOLN
INTRAVENOUS | Status: DC | PRN
  Administered 2020-02-22: 500 mL

## 2020-02-22 MED ORDER — LIDOCAINE HCL (PF) 1 % IJ SOLN
INTRAMUSCULAR | Status: DC | PRN
  Administered 2020-02-22: 40 mL

## 2020-02-22 MED ORDER — LIDOCAINE HCL (PF) 1 % IJ SOLN
INTRAMUSCULAR | Status: AC
  Filled 2020-02-22: qty 60

## 2020-02-22 MED ORDER — SODIUM CHLORIDE 0.9 % IV SOLN: INTRAVENOUS | Status: DC

## 2020-02-22 MED ORDER — FENTANYL CITRATE (PF) 100 MCG/2ML IJ SOLN
INTRAMUSCULAR | Status: DC | PRN
Start: 2020-02-22 — End: 2020-02-22
  Administered 2020-02-22 (×2): 50 ug via INTRAVENOUS

## 2020-02-22 MED ORDER — CEFAZOLIN SODIUM-DEXTROSE 2-4 GM/100ML-% IV SOLN
2.0000 g | INTRAVENOUS | Status: AC
  Administered 2020-02-22: 2 g via INTRAVENOUS

## 2020-02-22 MED ORDER — FENTANYL CITRATE (PF) 100 MCG/2ML IJ SOLN
INTRAMUSCULAR | Status: AC
  Filled 2020-02-22: qty 2

## 2020-02-22 MED ORDER — SODIUM CHLORIDE 0.9 % IV SOLN: 80.0000 mg | INTRAVENOUS | Status: DC

## 2020-02-22 MED ORDER — ACETAMINOPHEN 325 MG PO TABS: 325.0000 mg | ORAL_TABLET | ORAL | Status: DC | PRN

## 2020-02-22 MED ORDER — CEFAZOLIN SODIUM-DEXTROSE 2-3 GM-%(50ML) IV SOLR
INTRAVENOUS | Status: AC | PRN
  Administered 2020-02-22: 1 g via INTRAVENOUS

## 2020-02-22 SURGICAL SUPPLY — 4 items
CABLE SURGICAL S-101-97-12 (CABLE) ×3 IMPLANT
ICD VISIA MRI DVFB1D1 (ICD Generator) ×3 IMPLANT
PAD PRO RADIOLUCENT 2001M-C (PAD) ×3 IMPLANT
TRAY PACEMAKER INSERTION (PACKS) ×3 IMPLANT

## 2020-02-22 NOTE — Discharge Instructions (Signed)
DERMABOND WILL COME OFF IN NEXT 10-14 DAYS, IF NOT WILL REMOVED AT WOUND CHECK.  KEEP WOUND DRY UNTIL TOMORROW EVENING. NO DRIVING X 4 DAYS. WOUND CHECK IN OFFICE AS SCHEDULED.  Implantable Cardiac Device Battery Change, Care After  This sheet gives you information about how to care for yourself after your procedure. Your health care provider may also give you more specific instructions. If you have problems or questions, contact your health care provider. What can I expect after the procedure? After your procedure, it is common to have:  Pain or soreness at the site where the cardiac device was inserted.  Swelling at the site where the cardiac device was inserted.  You should received an information card for your new device in 4-8 weeks. Follow these instructions at home: Incision care   Keep the incision clean and dry. ? Do not take baths, swim, or use a hot tub until after your wound check.  ? Do not shower for at least 7 days, or as directed by your health care provider. ? Pat the area dry with a clean towel. Do not rub the area. This may cause bleeding.  Follow instructions from your health care provider about how to take care of your incision. Make sure you: ? Leave stitches (sutures), skin glue, or adhesive strips in place. These skin closures may need to stay in place for 2 weeks or longer. If adhesive strip edges start to loosen and curl up, you may trim the loose edges. Do not remove adhesive strips completely unless your health care provider tells you to do that.  Check your incision area every day for signs of infection. Check for: ? More redness, swelling, or pain. ? More fluid or blood. ? Warmth. ? Pus or a bad smell. Activity  Do not lift anything that is heavier than 10 lb (4.5 kg) until your health care provider says it is okay to do so.  For the first week, or as long as told by your health care provider: ? Avoid lifting your affected arm higher than your  shoulder. ? After 1 week, Be gentle when you move your arms over your head. It is okay to raise your arm to comb your hair. ? Avoid strenuous exercise.  Ask your health care provider when it is okay to: ? Resume your normal activities. ? Return to work or school. ? Resume sexual activity. Eating and drinking  Eat a heart-healthy diet. This should include plenty of fresh fruits and vegetables, whole grains, low-fat dairy products, and lean protein like chicken and fish.  Limit alcohol intake to no more than 1 drink a day for non-pregnant women and 2 drinks a day for men. One drink equals 12 oz of beer, 5 oz of wine, or 1 oz of hard liquor.  Check ingredients and nutrition facts on packaged foods and beverages. Avoid the following types of food: ? Food that is high in salt (sodium). ? Food that is high in saturated fat, like full-fat dairy or red meat. ? Food that is high in trans fat, like fried food. ? Food and drinks that are high in sugar. Lifestyle  Do not use any products that contain nicotine or tobacco, such as cigarettes and e-cigarettes. If you need help quitting, ask your health care provider.  Take steps to manage and control your weight.  Once cleared, get regular exercise. Aim for 150 minutes of moderate-intensity exercise (such as walking or yoga) or 75 minutes of vigorous exercise (  such as running or swimming) each week.  Manage other health problems, such as diabetes or high blood pressure. Ask your health care provider how you can manage these conditions. General instructions  Do not drive for 24 hours after your procedure if you were given a medicine to help you relax (sedative).  Take over-the-counter and prescription medicines only as told by your health care provider.  Avoid putting pressure on the area where the cardiac device was placed.  If you need an MRI after your cardiac device has been placed, be sure to tell the health care provider who orders the MRI  that you have a cardiac device.  Avoid close and prolonged exposure to electrical devices that have strong magnetic fields. These include: ? Cell phones. Avoid keeping them in a pocket near the cardiac device, and try using the ear opposite the cardiac device. ? MP3 players. ? Household appliances, like microwaves. ? Metal detectors. ? Electric generators. ? High-tension wires.  Keep all follow-up visits as directed by your health care provider. This is important. Contact a health care provider if:  You have pain at the incision site that is not relieved by over-the-counter or prescription medicines.  You have any of these around your incision site or coming from it: ? More redness, swelling, or pain. ? Fluid or blood. ? Warmth to the touch. ? Pus or a bad smell.  You have a fever.  You feel brief, occasional palpitations, light-headedness, or any symptoms that you think might be related to your heart. Get help right away if:  You experience chest pain that is different from the pain at the cardiac device site.  You develop a red streak that extends above or below the incision site.  You experience shortness of breath.  You have palpitations or an irregular heartbeat.  You have light-headedness that does not go away quickly.  You faint or have dizzy spells.  Your pulse suddenly drops or increases rapidly and does not return to normal.  You begin to gain weight and your legs and ankles swell. Summary  After your procedure, it is common to have pain, soreness, and some swelling where the cardiac device was inserted.  Make sure to keep your incision clean and dry. Follow instructions from your health care provider about how to take care of your incision.  Check your incision every day for signs of infection, such as more pain or swelling, pus or a bad smell, warmth, or leaking fluid and blood.  Avoid strenuous exercise and lifting your left arm higher than your shoulder  for 2 weeks, or as long as told by your health care provider. This information is not intended to replace advice given to you by your health care provider. Make sure you discuss any questions you have with your health care provider.

## 2020-02-22 NOTE — Interval H&P Note (Signed)
History and Physical Interval Note:  02/22/2020 9:41 AM  William Davila  has presented today for surgery, with the diagnosis of eri.  The various methods of treatment have been discussed with the patient and family. After consideration of risks, benefits and other options for treatment, the patient has consented to  Procedure(s): ICD [place] (N/A) as a surgical intervention.  The patient's history has been reviewed, patient examined, no change in status, stable for surgery.  I have reviewed the patient's chart and labs.  Questions were answered to the patient's satisfaction.     Virl Axe

## 2020-02-22 NOTE — Interval H&P Note (Signed)
History and Physical Interval Note:  02/22/2020 11:30 AM ICD Criteria  Current LVEF:25%. Within 12 months prior to implant: Yes   Heart failure history: Yes, Class II  Cardiomyopathy history: Yes, Non-Ischemic Cardiomyopathy.  Atrial Fibrillation/Atrial Flutter: No.  Ventricular tachycardia history: No.  Cardiac arrest history: No.  History of syndromes with risk of sudden death: No.  Previous ICD: Yes, Reason for ICD:  Primary prevention.  Current ICD indication: Primary  PPM indication: No.  Class I or II Bradycardia indication present: No  Beta Blocker therapy for 3 or more months: Yes, prescribed.   Ace Inhibitor/ARB therapy for 3 or more months: Yes, prescribed.    I have seen William Davila is a 54 y.o. malepre-procedural for consideration of ICD REimplant for PRIMARY prevention of sudden death.  The patient's chart has been reviewed and they meet criteria for ICD implant.  I have had a thorough discussion with the patient reviewing options.  The patient and their family (if available) have had opportunities to ask questions and have them answered. The patient and I have decided together through the [place] [employer] to REIMPLANT ICD at this time.  Risks, benefits, alternatives to ICD implantation were discussed in detail with the patient today. The patient  understands that the risks include but are not limited to bleeding, infection, pneumothorax, perforation, tamponade, vascular damage, renal failure, MI, stroke, death, inappropriate shocks, and lead dislodgement and   wishes to proceed.   William Davila  has presented today for surgery, with the diagnosis of ERI  The various methods of treatment have been discussed with the patient and family. After consideration of risks, benefits and other options for treatment, the patient has consented to  Procedure(s): ICD [place] (N/A) as a surgical intervention.  The patient's history has been  reviewed, patient examined, no change in status, stable for surgery.  I have reviewed the patient's chart and labs.  Questions were answered to the patient's satisfaction.     Virl Axe

## 2020-02-22 NOTE — Interval H&P Note (Signed)
History and Physical Interval Note:  02/22/2020 9:50 AM  Darrick Grinder  has presented today for surgery, with the diagnosis of eri.  The various methods of treatment have been discussed with the patient and family. After consideration of risks, benefits and other options for treatment, the patient has consented to  Procedure(s): ICD [place] (N/A) as a surgical intervention.  The patient's history has been reviewed, patient examined, no change in status, stable for surgery.  I have reviewed the patient's chart and labs.  Questions were answered to the patient's satisfaction.     Virl Axe  Well developed and Morbidly obese in no acute distress HENT normal Neck supple with JVP-flat Device pocket well healed; without hematoma or erythema.  There is no tethering  Clear Regular rate and rhythm, no murmurs or gallops Abd-soft with active BS without hepatomegaly No Clubbing cyanosis edema Skin-warm and dry A & Oriented  Grossly normal sensory and motor function   We have reviewed the benefits and risks of generator replacement.  These include but are not limited to lead fracture and infection.  The patient understands, agrees and is willing to proceed.

## 2020-02-25 ENCOUNTER — Telehealth: Payer: Self-pay

## 2020-02-25 MED FILL — Cefazolin Sodium-Dextrose IV Solution 2 GM/100ML-4%: INTRAVENOUS | Qty: 100 | Status: AC

## 2020-02-25 NOTE — Telephone Encounter (Signed)
Spoke with pt and advised labs are normal with exception of a minimal increase of WBC's.  Pt denies any symptoms of infection or fever.  Pt verbalized understanding and thanked Therapist, sports for call.

## 2020-02-25 NOTE — Telephone Encounter (Signed)
-----   Message from William Sprang, MD sent at 02/20/2020  7:53 PM EDT ----- Please Inform Patient that labs are normal x min increase of wbc Thanks

## 2020-03-01 ENCOUNTER — Other Ambulatory Visit (HOSPITAL_COMMUNITY): Payer: Self-pay | Admitting: Internal Medicine

## 2020-03-01 DIAGNOSIS — I5022 Chronic systolic (congestive) heart failure: Secondary | ICD-10-CM

## 2020-03-04 ENCOUNTER — Ambulatory Visit (INDEPENDENT_AMBULATORY_CARE_PROVIDER_SITE_OTHER): Payer: Medicare Other | Admitting: Emergency Medicine

## 2020-03-04 ENCOUNTER — Other Ambulatory Visit: Payer: Self-pay

## 2020-03-04 DIAGNOSIS — Z95810 Presence of automatic (implantable) cardiac defibrillator: Secondary | ICD-10-CM

## 2020-03-04 DIAGNOSIS — I472 Ventricular tachycardia, unspecified: Secondary | ICD-10-CM

## 2020-03-04 DIAGNOSIS — I5042 Chronic combined systolic (congestive) and diastolic (congestive) heart failure: Secondary | ICD-10-CM

## 2020-03-04 NOTE — Progress Notes (Signed)
Wound check appointment after generator change. Steri-strips removed. Wound without redness or edema. Incision edges approximated, wound well healed. Normal device function. Thresholds, sensing, and impedances consistent with implant measurements. Device programmed at chronic settings due to mature leads. Histogram distribution appropriate for patient and level of activity. No ventricular arrhythmias noted. Patient educated about wound care, and shock plan. ROV with Dr Caryl Comes on 06/03/20. Enrolled in remote follow-up and next remote scheduled for 05/23/20. Data unavailable do to collection error.

## 2020-03-31 ENCOUNTER — Other Ambulatory Visit (HOSPITAL_COMMUNITY): Payer: Self-pay | Admitting: Internal Medicine

## 2020-04-30 ENCOUNTER — Other Ambulatory Visit: Payer: Self-pay | Admitting: Internal Medicine

## 2020-05-01 ENCOUNTER — Other Ambulatory Visit: Payer: Self-pay | Admitting: Internal Medicine

## 2020-05-02 ENCOUNTER — Other Ambulatory Visit (HOSPITAL_COMMUNITY): Payer: Self-pay | Admitting: *Deleted

## 2020-05-02 MED ORDER — TORSEMIDE 20 MG PO TABS
60.0000 mg | ORAL_TABLET | Freq: Every day | ORAL | 0 refills | Status: DC
Start: 2020-05-02 — End: 2020-05-30

## 2020-05-13 ENCOUNTER — Other Ambulatory Visit (HOSPITAL_COMMUNITY): Payer: Self-pay | Admitting: Internal Medicine

## 2020-05-15 ENCOUNTER — Other Ambulatory Visit (HOSPITAL_COMMUNITY): Payer: Self-pay | Admitting: *Deleted

## 2020-05-15 MED ORDER — POTASSIUM CHLORIDE CRYS ER 20 MEQ PO TBCR: 20.0000 meq | EXTENDED_RELEASE_TABLET | Freq: Two times a day (BID) | ORAL | 0 refills | Status: DC

## 2020-05-23 ENCOUNTER — Ambulatory Visit (INDEPENDENT_AMBULATORY_CARE_PROVIDER_SITE_OTHER): Payer: Medicare Other

## 2020-05-23 DIAGNOSIS — I472 Ventricular tachycardia, unspecified: Secondary | ICD-10-CM

## 2020-05-24 LAB — CUP PACEART REMOTE DEVICE CHECK
Battery Remaining Longevity: 135 mo
Battery Voltage: 3.1 V
Brady Statistic RV Percent Paced: 0 %
Date Time Interrogation Session: 20211210142757
HighPow Impedance: 79 Ohm
HighPow Impedance: 96 Ohm
Implantable Lead Implant Date: 20130515
Implantable Lead Location: 753860
Implantable Lead Model: 7121
Implantable Pulse Generator Implant Date: 20210910
Lead Channel Impedance Value: 1425 Ohm
Lead Channel Impedance Value: 323 Ohm
Lead Channel Pacing Threshold Amplitude: 0.625 V
Lead Channel Pacing Threshold Pulse Width: 0.4 ms
Lead Channel Sensing Intrinsic Amplitude: 10 mV
Lead Channel Sensing Intrinsic Amplitude: 10 mV
Lead Channel Setting Pacing Amplitude: 2 V
Lead Channel Setting Pacing Pulse Width: 0.4 ms
Lead Channel Setting Sensing Sensitivity: 0.3 mV

## 2020-05-26 ENCOUNTER — Ambulatory Visit
Admission: EM | Admit: 2020-05-26 | Discharge: 2020-05-26 | Disposition: A | Discharge status: Home / Self Care | Payer: Medicare Other | Attending: Emergency Medicine | Admitting: Emergency Medicine

## 2020-05-26 ENCOUNTER — Other Ambulatory Visit: Payer: Self-pay

## 2020-05-26 DIAGNOSIS — M25512 Pain in left shoulder: Secondary | ICD-10-CM

## 2020-05-26 MED ORDER — PREDNISONE 10 MG (21) PO TBPK: ORAL_TABLET | Freq: Every day | ORAL | 0 refills | Status: DC

## 2020-05-26 NOTE — ED Triage Notes (Signed)
Patient complains of left arm pain for several days. Pt is unaware of any injury and is aox4 and ambulatory.

## 2020-05-26 NOTE — ED Provider Notes (Signed)
[facility]    CSN: [id number] Arrival date & time: 05/26/20  1215      History   Chief Complaint Chief Complaint  Patient presents with  . Shoulder Pain    Several days    HPI William Davila is a 54 y.o. male  With extensive history as below presenting for left shoulder pain for the last several days. No injury, trauma, poor sleeping position. Denies distal extremity numbness, weakness, paresthesia. No neck pain, headache, fever. Denies chest pain, palpitations, difficulty breathing, nausea or vomiting, change in medications. Pain is worse with movement. Tried colchicine, given history of gout, without relief.  Past Medical History:  Diagnosis Date  . AICD (automatic cardioverter/defibrillator) present   . Arthritis    knee's  . Cardiomyopathy ([place])   . Chronic systolic heart failure ([place])    Diagnosed 2001; prior patient of Dr. Linard Millers with [name] - discharged from practice  . Dilated cardiomyopathy (HCC)    LVEF 10-20%  . Essential hypertension, benign   . Gout   . Hyperlipidemia   . Hypertension   . 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 Morbid obesity ([place])   . Presence of combination internal cardiac defibrillator (ICD) and pacemaker   . Presence of permanent cardiac pacemaker   . VT (ventricular tachycardia) ([place]) 06/07/2011    Patient Active Problem List   Diagnosis Date Noted  . ICD (implantable cardioverter-defibrillator) in place  - mdt 01/30/2020  . Benign neoplasm of colon   . Arthritis 07/12/2016  . Thrombocytosis 09/11/2014  . Diabetes mellitus type 2 in obese ([place]) 07/31/2014  . Special screening for malignant neoplasms, colon 07/24/2014  . Leukocytosis 06/05/2014  . Normocytic anemia 06/05/2014  . Gout 05/06/2014  . 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 Implantable defibrillator-Medtronic atrial and LV port plugged 01/27/2012  . Knee pain, right 06/17/2011  . VT (ventricular tachycardia) ([place]) 06/07/2011   . Chronic combined systolic and diastolic congestive heart failure ([place]) 05/02/2011  . Quadriceps muscle rupture 02/02/2011  . Knee pain, left 12/15/2010  . Elevated uric acid in blood 08/14/2010  . HYPERLIPIDEMIA 07/11/2009  . Morbid obesity ([place]) 05/29/2008  . NICM (nonischemic cardiomyopathy) ([place]) 05/29/2008  . Essential hypertension 05/29/2008    Past Surgical History:  Procedure Laterality Date  . BIOPSY  12/27/2017   Procedure: BIOPSY;  Surgeon: Yetta Flock, MD;  Location: [facility];  Service: Gastroenterology;;  . COLONOSCOPY WITH PROPOFOL N/A 12/27/2017   Procedure: COLONOSCOPY WITH PROPOFOL;  Surgeon: Yetta Flock, MD;  Location: [facility];  Service: Gastroenterology;  Laterality: N/A;  . ICD  10/27/2011  . ICD GENERATOR CHANGEOUT N/A 02/22/2020   Procedure: ICD GENERATOR CHANGEOUT;  Surgeon: Deboraha Sprang, MD;  Location: [facility];  Service: Cardiovascular;  Laterality: N/A;  . IMPLANTABLE CARDIOVERTER DEFIBRILLATOR IMPLANT N/A 10/27/2011   Procedure: IMPLANTABLE CARDIOVERTER DEFIBRILLATOR IMPLANT;  Surgeon: Deboraha Sprang, MD;  Location: [facility] C[facility] LAB;  Service: Cardiovascular;  Laterality: N/A;  . POLYPECTOMY  12/27/2017   Procedure: POLYPECTOMY;  Surgeon: Yetta Flock, MD;  Location: [facility];  Service: Gastroenterology;;  . RIGHT/LEFT HEART CATH AND CORONARY ANGIOGRAPHY N/A 03/01/2019   Procedure: RIGHT/LEFT HEART CATH AND CORONARY ANGIOGRAPHY;  Surgeon: Jolaine Artist, MD;  Location: [facility];  Service: Cardiovascular;  Laterality: N/A;       Home Medications    Prior to Admission medications   Medication Sig Start Date End Date Taking? Authorizing Provider  acetaminophen (  TYLENOL) 500 MG tablet Take 1,000 mg by mouth every 6 (six) hours as needed for mild pain or moderate pain. For pain   Yes [provider]  allopurinol (ZYLOPRIM) 300 MG tablet Take 300 mg by mouth daily.  11/16/17  Yes [provider]  carvedilol (COREG) 25 MG tablet Take 1 tablet (25 mg total) by mouth 2 (two) times daily with a meal. Needs appt for further refills 03/03/20  Yes Bensimhon, Shaune Pascal, MD  clotrimazole-betamethasone (LOTRISONE) cream Apply 1 application topically daily as needed (Rash).   Yes [provider]  colchicine 0.6 MG tablet Take 1 tablet by mouth 2 (two) times daily. 02/10/16  Yes [provider]  digoxin (LANOXIN) 0.125 MG tablet Take 1 tablet (125 mcg total) by mouth daily. 12/03/19  Yes Bensimhon, Shaune Pascal, MD  naproxen (NAPROSYN) 500 MG tablet Take 500 mg by mouth 2 (two) times daily as needed (pain).   Yes [provider]  spironolactone (ALDACTONE) 25 MG tablet Take 1 tablet (25 mg total) by mouth daily. 12/03/19  Yes Bensimhon, Shaune Pascal, MD  tiZANidine (ZANAFLEX) 4 MG tablet Take 4 mg by mouth 2 (two) times daily as needed for muscle spasms (Back).  01/13/20  Yes [provider]  potassium chloride SA (KLOR-CON) 20 MEQ tablet Take 1 tablet (20 mEq total) by mouth 2 (two) times daily. Last refill. Please call for office visit [phone] 05/15/20   Bensimhon, Shaune Pascal, MD  predniSONE (STERAPRED UNI-PAK 21 TAB) 10 MG (21) TBPK tablet Take by mouth daily. Take steroid taper as written 05/26/20   Hall-Potvin, [place], PA-C  sacubitril-valsartan (ENTRESTO) 97-103 MG Take 1 tablet by mouth 2 (two) times daily. Must have office visit for further refills [phone] 04/01/20   Bensimhon, Shaune Pascal, MD  torsemide (DEMADEX) 20 MG tablet Take 3 tablets (60 mg total) by mouth daily. PLEASE MAKE OFFICE VISIT FOR FURTHER REFILLS. 05/02/20   Bensimhon, Shaune Pascal, MD  Vitamin D, Ergocalciferol, (DRISDOL) 1.25 MG (50000 UNIT) CAPS capsule Take 50,000 Units by mouth once a week. 01/29/20   [provider]    Family History Family History  Problem Relation Age of Onset  . Hypertension Mother     Social History Social History   Tobacco Use  . Smoking status:  Never Smoker  . Smokeless tobacco: Never Used  Vaping Use  . Vaping Use: Never used  Substance Use Topics  . Alcohol use: Yes    Comment: occasionally (1-2 per week)  . Drug use: No     Allergies   Beta adrenergic blockers and Isosorb dinitrate-hydralazine   Review of Systems Review of Systems  Constitutional: Negative for fatigue and fever.  Respiratory: Negative for cough and shortness of breath.   Cardiovascular: Negative for chest pain and palpitations.  Gastrointestinal: Negative for abdominal pain, diarrhea and vomiting.  Musculoskeletal: Negative for arthralgias and myalgias.       Positive for L shoulder pain  Skin: Negative for rash and wound.  Neurological: Negative for dizziness, facial asymmetry, speech difficulty, weakness, light-headedness, numbness and headaches.  All other systems reviewed and are negative.    Physical Exam Triage Vital Signs ED Triage Vitals  Enc Vitals Group     BP 05/26/20 1258 120/81     Pulse Rate 05/26/20 1258 93     Resp 05/26/20 1258 20     Temp 05/26/20 1258 98.2 F (36.8 C)     Temp Source 05/26/20 1258 Oral     SpO2  05/26/20 1258 96 %     Weight --      Height --      Head Circumference --      Peak Flow --      Pain Score 05/26/20 1301 9     Pain Loc --      Pain Edu? --      Excl. in [place]? --    No data found.  Updated Vital Signs BP 120/81 (BP Location: Right Arm)   Pulse 93   Temp 98.2 F (36.8 C) (Oral)   Resp 20   SpO2 96%   Visual Acuity Right Eye Distance:   Left Eye Distance:   Bilateral Distance:    Right Eye Near:   Left Eye Near:    Bilateral Near:     Physical Exam Constitutional:      General: He is not in acute distress. HENT:     Head: Normocephalic and atraumatic.  Eyes:     General: No scleral icterus.    Pupils: Pupils are equal, round, and reactive to light.  Cardiovascular:     Rate and Rhythm: Normal rate.     Pulses: Normal pulses.  Pulmonary:     Effort: Pulmonary effort  is normal. No respiratory distress.     Breath sounds: No wheezing.  Musculoskeletal:        General: Tenderness present. No swelling.     Comments: Decreased shoulder extension secondary to pain. No focal deformity. No clavicular tenderness. Patient does have mild AC joint tenderness, moderate proximal deltoid tenderness and moderate posterior tenderness. No scapular tenderness.  Worse w/ cross arm.  NVI  Skin:    Coloration: Skin is not jaundiced or pale.  Neurological:     Mental Status: He is alert and oriented to person, place, and time.      UC Treatments / Results  Labs (all labs ordered are listed, but only abnormal results are displayed) Labs Reviewed - No data to display  EKG   Radiology No results found.  Procedures Procedures (including critical care time)  Medications Ordered in UC Medications - No data to display  Initial Impression / Assessment and Plan / UC Course  I have reviewed the triage vital signs and the nursing notes.  Pertinent labs & imaging results that were available during my care of the patient were reviewed by me and considered in my medical decision making (see chart for details).     Patient afebrile, nontoxic, hemodynamically stable in office. No signs of acute distress. Denying cardiopulmonary symptoms. Patient does have significant cardiac history. Discussed at length risk/benefits of steroid use and MSK pain, which I feel is most likely given exam.  Return precautions discussed, pt verbalized understanding and is agreeable to plan. Final Clinical Impressions(s) / UC Diagnoses   Final diagnoses:  Acute pain of left shoulder     Discharge Instructions     Prednisone with breakfast: 6-5-4-3-2-1    ED Prescriptions    Medication Sig Dispense Auth. Provider   predniSONE (STERAPRED UNI-PAK 21 TAB) 10 MG (21) TBPK tablet Take by mouth daily. Take steroid taper as written 21 tablet Hall-Potvin, [place], PA-C     PDMP not reviewed  this encounter.   Hall-Potvin, [place], [place] 05/26/20 1508

## 2020-05-26 NOTE — Discharge Instructions (Addendum)
Prednisone with breakfast: 6-5-4-3-2-1

## 2020-05-30 ENCOUNTER — Telehealth (HOSPITAL_COMMUNITY): Payer: Self-pay | Admitting: Internal Medicine

## 2020-05-30 ENCOUNTER — Other Ambulatory Visit (HOSPITAL_COMMUNITY): Payer: Self-pay | Admitting: Internal Medicine

## 2020-05-30 DIAGNOSIS — I5022 Chronic systolic (congestive) heart failure: Secondary | ICD-10-CM

## 2020-05-30 DIAGNOSIS — I472 Ventricular tachycardia, unspecified: Secondary | ICD-10-CM

## 2020-05-30 DIAGNOSIS — I10 Essential (primary) hypertension: Secondary | ICD-10-CM

## 2020-06-03 ENCOUNTER — Encounter: Payer: Self-pay | Admitting: Internal Medicine

## 2020-06-03 ENCOUNTER — Ambulatory Visit (INDEPENDENT_AMBULATORY_CARE_PROVIDER_SITE_OTHER): Payer: Medicare Other | Admitting: Internal Medicine

## 2020-06-03 ENCOUNTER — Telehealth (HOSPITAL_COMMUNITY): Payer: Self-pay | Admitting: Internal Medicine

## 2020-06-03 ENCOUNTER — Other Ambulatory Visit: Payer: Self-pay

## 2020-06-03 VITALS — BP 102/64 | HR 94 | Ht 70.0 in | Wt 393.0 lb

## 2020-06-03 DIAGNOSIS — I472 Ventricular tachycardia, unspecified: Secondary | ICD-10-CM

## 2020-06-03 DIAGNOSIS — I5042 Chronic combined systolic (congestive) and diastolic (congestive) heart failure: Secondary | ICD-10-CM

## 2020-06-03 DIAGNOSIS — I428 Other cardiomyopathies: Secondary | ICD-10-CM

## 2020-06-03 DIAGNOSIS — Z95810 Presence of automatic (implantable) cardiac defibrillator: Secondary | ICD-10-CM | POA: Diagnosis not present

## 2020-06-03 DIAGNOSIS — Z79899 Other long term (current) drug therapy: Secondary | ICD-10-CM

## 2020-06-03 NOTE — Patient Instructions (Signed)
Medication Instructions:  Your physician recommends that you continue on your current medications as directed. Please refer to the Current Medication list given to you today.  *If you need a refill on your cardiac medications before your next appointment, please call your pharmacy*   Lab Work: Digoxin level  If you have labs (blood work) drawn today and your tests are completely normal, you will receive your results only by: Marland Kitchen MyChart Message (if you have MyChart) OR . A paper copy in the mail If you have any lab test that is abnormal or we need to change your treatment, we will call you to review the results.   Testing/Procedures: None ordered.    Follow-Up: At [facility], you and your health needs are our priority.  As part of our continuing mission to provide you with exceptional heart care, we have created designated Provider Care Teams.  These Care Teams include your primary Cardiologist (physician) and Advanced Practice Providers (APPs -  Physician Assistants and Nurse Practitioners) who all work together to provide you with the care you need, when you need it.  We recommend signing up for the patient portal called "MyChart".  Sign up information is provided on this After Visit Summary.  MyChart is used to connect with patients for Virtual Visits (Telemedicine).  Patients are able to view lab/test results, encounter notes, upcoming appointments, etc.  Non-urgent messages can be sent to your provider as well.   To learn more about what you can do with MyChart, go to [email or web address].    Your next appointment:   12 month(s)  The format for your next appointment:   In Person  Provider:   Virl Axe, MD

## 2020-06-03 NOTE — Telephone Encounter (Signed)
Pt request refill for Entresto, and potassium chloride, please send scripts tom [facility], [address], pt can be reached [phone].  Thanks

## 2020-06-03 NOTE — Progress Notes (Signed)
Patient Care Team: Nolene Ebbs, MD as PCP - General (Internal Medicine)   HPI  William Davila is a 54 y.o. 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 He has a history of nonsustained ventricular tachycardia.  The patient denies chest pain, shortness of breath, nocturnal dyspnea, orthopnea or peripheral edema.  There have been no palpitations, lightheadedness or syncope.  Effort is largely limited however by his knees.  He may have sleep disordered breathing.  His brother staying with him and says he snores some.  He has daytime somnolence falling asleep watching TV.  He does not fall asleep however watching television.   DATE TEST EF   3/13 Echo 20-25%   1/16 Echo 25-30%   1/18 Echo 20-25%   9/20 Echo 25-30%   9/20 LHC 25-30% Cors Normal         Date Cr K Dig  12/16 1.21 3.8   2/18 1.26 4.1 0.5  9/20 1.09 3.8 0/4 (2/20)  9/21 1.28 4.5        Not very active  Past Medical History:  Diagnosis Date  . AICD (automatic cardioverter/defibrillator) present   . Arthritis    knee's  . Cardiomyopathy ([place])   . Chronic systolic heart failure ([place])    Diagnosed 2001; prior patient of Dr. Linard Millers with [name] - discharged from practice  . Dilated cardiomyopathy (HCC)    LVEF 10-20%  . Essential hypertension, benign   . Gout   . Hyperlipidemia   . Hypertension   . 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 Morbid obesity ([place])   . Presence of combination internal cardiac defibrillator (ICD) and pacemaker   . Presence of permanent cardiac pacemaker   . VT (ventricular tachycardia) ([place]) 06/07/2011    Past Surgical History:  Procedure Laterality Date  . BIOPSY  12/27/2017   Procedure: BIOPSY;  Surgeon: Yetta Flock, MD;  [facility]: [facility];  Service: Gastroenterology;;  . COLONOSCOPY WITH PROPOFOL N/A 12/27/2017   Procedure:  COLONOSCOPY WITH PROPOFOL;  Surgeon: Yetta Flock, MD;  Location: [facility];  Service: Gastroenterology;  Laterality: N/A;  . ICD  10/27/2011  . ICD GENERATOR CHANGEOUT N/A 02/22/2020   Procedure: ICD GENERATOR CHANGEOUT;  Surgeon: Deboraha Sprang, MD;  Location: [facility];  Service: Cardiovascular;  Laterality: N/A;  . IMPLANTABLE CARDIOVERTER DEFIBRILLATOR IMPLANT N/A 10/27/2011   Procedure: IMPLANTABLE CARDIOVERTER DEFIBRILLATOR IMPLANT;  Surgeon: Deboraha Sprang, MD;  [facility];  Service: Cardiovascular;  Laterality: N/A;  . POLYPECTOMY  12/27/2017   Procedure: POLYPECTOMY;  Surgeon: Yetta Flock, MD;  Location: [facility];  Service: Gastroenterology;;  . RIGHT/LEFT HEART CATH AND CORONARY ANGIOGRAPHY N/A 03/01/2019   Procedure: RIGHT/LEFT HEART CATH AND CORONARY ANGIOGRAPHY;  Surgeon: Jolaine Artist, MD;  Location: [place] [facility];  Service: Cardiovascular;  Laterality: N/A;    Current Outpatient Medications  Medication Sig Dispense Refill  . acetaminophen (TYLENOL) 500 MG tablet Take 1,000 mg by mouth every 6 (six) hours as needed for mild pain or moderate pain. For pain    . allopurinol (ZYLOPRIM) 300 MG tablet Take 300 mg by mouth daily.   1  . carvedilol (COREG) 25 MG tablet TAKE 1 TABLET(25 MG) BY MOUTH TWICE DAILY WITH A MEAL 60 tablet 0  . clotrimazole-betamethasone (LOTRISONE) cream Apply 1 application topically daily as needed (Rash).    Marland Kitchen  colchicine 0.6 MG tablet Take 1 tablet by mouth 2 (two) times daily.    . digoxin (LANOXIN) 0.125 MG tablet TAKE 1 TABLET(125 MCG) BY MOUTH DAILY 30 tablet 0  . naproxen (NAPROSYN) 500 MG tablet Take 500 mg by mouth 2 (two) times daily as needed (pain).    . potassium chloride SA (KLOR-CON) 20 MEQ tablet Take 1 tablet (20 mEq total) by mouth 2 (two) times daily. Last refill. Please call for office visit [phone] 30 tablet 0  . sacubitril-valsartan (ENTRESTO) 97-103 MG Take 1 tablet by mouth 2 (two)  times daily. Must have office visit for further refills [phone] 60 tablet 0  . spironolactone (ALDACTONE) 25 MG tablet TAKE 1 TABLET(25 MG) BY MOUTH DAILY 30 tablet 0  . tiZANidine (ZANAFLEX) 4 MG tablet Take 4 mg by mouth 2 (two) times daily as needed for muscle spasms (Back).     . torsemide (DEMADEX) 20 MG tablet TAKE 3 TABLETS(60 MG) BY MOUTH DAILY. PLEASE MAKE OFFICE VISIT FOR FURTHER REFILLS 90 tablet 0  . Vitamin D, Ergocalciferol, (DRISDOL) 1.25 MG (50000 UNIT) CAPS capsule Take 50,000 Units by mouth once a week.     No current facility-administered medications for this visit.    Allergies  Allergen Reactions  . Beta Adrenergic Blockers Other (See Comments)    REACTION: fatigue and increased dyspnea with beta blockers.  . Isosorb Dinitrate-Hydralazine Other (See Comments)    REACTION: headache    Review of Systems negative except from HPI and PMH  Physical Exam   BP 102/64   Pulse 94   Ht 5\' 10"  (1.778 m)   Wt (!) 393 lb (178.3 kg)   BMI 56.39 kg/m  Well developed and Morbidly obese in no acute distress HENT normal Neck supple  Clear Device pocket well healed; without hematoma or erythema.  There is no tethering  Regular rate and rhythm, no  gallop No murmur Abd-soft with active BS No Clubbing cyanosis  edema Skin-warm and dry A & Oriented  Grossly normal sensory and motor function  ECG sinus at 94 Intervals 25/14/36 Axis left -49   Assessment and  Plan  NICM   ICD Medtronic The patient's device was interrogated.  The information was reviewed. No changes were made in the programming.     CHF   High Risk Medication Surveillance  Morbidly obese     HTN  Euvolemic continue current meds, will check his digoxin level  Blood pressure well controlled  Exercise is limited by arthritis in his knees.  We will have him talk with his brother; suspect he would benefit from a sleep study.

## 2020-06-04 LAB — DIGOXIN LEVEL: Digoxin, Serum: 0.6 ng/mL (ref 0.5–0.9)

## 2020-06-04 MED ORDER — POTASSIUM CHLORIDE CRYS ER 20 MEQ PO TBCR: 20.0000 meq | EXTENDED_RELEASE_TABLET | Freq: Two times a day (BID) | ORAL | 2 refills | Status: DC

## 2020-06-04 MED ORDER — ENTRESTO 97-103 MG PO TABS: 1.0000 | ORAL_TABLET | Freq: Two times a day (BID) | ORAL | 2 refills | Status: DC

## 2020-06-04 NOTE — Telephone Encounter (Signed)
Done

## 2020-06-05 NOTE — Progress Notes (Signed)
Remote ICD transmission.   

## 2020-06-18 ENCOUNTER — Telehealth: Payer: Self-pay

## 2020-06-18 NOTE — Telephone Encounter (Signed)
-----   Message from Duke Salvia, MD sent at 06/13/2020  2:29 PM EST -----  Please inform patient that drug surveillance labs are normal

## 2020-06-18 NOTE — Telephone Encounter (Signed)
Spoke with pt and advised per Dr Klein labs are normal.  Pt verbalizes understanding and thanked RN for the call. 

## 2020-06-29 ENCOUNTER — Other Ambulatory Visit (HOSPITAL_COMMUNITY): Payer: Self-pay | Admitting: Internal Medicine

## 2020-06-29 DIAGNOSIS — I472 Ventricular tachycardia, unspecified: Secondary | ICD-10-CM

## 2020-06-29 DIAGNOSIS — I5022 Chronic systolic (congestive) heart failure: Secondary | ICD-10-CM

## 2020-06-29 DIAGNOSIS — I10 Essential (primary) hypertension: Secondary | ICD-10-CM

## 2020-07-02 ENCOUNTER — Other Ambulatory Visit (HOSPITAL_COMMUNITY): Payer: Self-pay

## 2020-07-02 MED ORDER — TORSEMIDE 20 MG PO TABS: 60.0000 mg | ORAL_TABLET | Freq: Once | ORAL | 0 refills | Status: DC

## 2020-07-09 ENCOUNTER — Other Ambulatory Visit (HOSPITAL_COMMUNITY): Payer: Self-pay | Admitting: Internal Medicine

## 2020-07-18 ENCOUNTER — Encounter (HOSPITAL_COMMUNITY): Payer: Self-pay | Admitting: Internal Medicine

## 2020-07-18 ENCOUNTER — Other Ambulatory Visit: Payer: Self-pay

## 2020-07-18 ENCOUNTER — Ambulatory Visit (HOSPITAL_COMMUNITY)
Admission: RE | Admit: 2020-07-18 | Discharge: 2020-07-18 | Disposition: A | Discharge status: Home / Self Care | Payer: Medicare Other | Source: Ambulatory Visit | Attending: Internal Medicine | Admitting: Internal Medicine

## 2020-07-18 VITALS — BP 118/70 | HR 84 | Ht 70.0 in | Wt 397.2 lb

## 2020-07-18 DIAGNOSIS — Z7901 Long term (current) use of anticoagulants: Secondary | ICD-10-CM | POA: Insufficient documentation

## 2020-07-18 DIAGNOSIS — Z79899 Other long term (current) drug therapy: Secondary | ICD-10-CM | POA: Diagnosis not present

## 2020-07-18 DIAGNOSIS — I5022 Chronic systolic (congestive) heart failure: Secondary | ICD-10-CM | POA: Diagnosis not present

## 2020-07-18 DIAGNOSIS — M25512 Pain in left shoulder: Secondary | ICD-10-CM | POA: Insufficient documentation

## 2020-07-18 DIAGNOSIS — E785 Hyperlipidemia, unspecified: Secondary | ICD-10-CM | POA: Insufficient documentation

## 2020-07-18 DIAGNOSIS — I429 Cardiomyopathy, unspecified: Secondary | ICD-10-CM | POA: Diagnosis not present

## 2020-07-18 DIAGNOSIS — I10 Essential (primary) hypertension: Secondary | ICD-10-CM

## 2020-07-18 DIAGNOSIS — Z8249 Family history of ischemic heart disease and other diseases of the circulatory system: Secondary | ICD-10-CM | POA: Diagnosis not present

## 2020-07-18 DIAGNOSIS — Z6841 Body Mass Index (BMI) 40.0 and over, adult: Secondary | ICD-10-CM | POA: Diagnosis not present

## 2020-07-18 DIAGNOSIS — I110 Hypertensive heart disease with heart failure: Secondary | ICD-10-CM | POA: Diagnosis not present

## 2020-07-18 LAB — BRAIN NATRIURETIC PEPTIDE: B Natriuretic Peptide: 55.6 pg/mL (ref 0.0–100.0)

## 2020-07-18 LAB — DIGOXIN LEVEL: Digoxin Level: 0.7 ng/mL — ABNORMAL LOW (ref 0.8–2.0)

## 2020-07-18 LAB — BASIC METABOLIC PANEL
Anion gap: 11 (ref 5–15)
BUN: 14 mg/dL (ref 6–20)
CO2: 23 mmol/L (ref 22–32)
Calcium: 9.1 mg/dL (ref 8.9–10.3)
Chloride: 103 mmol/L (ref 98–111)
Creatinine, Ser: 1.27 mg/dL — ABNORMAL HIGH (ref 0.61–1.24)
GFR, Estimated: >60 mL/min (ref >60)
Glucose, Bld: 113 mg/dL — ABNORMAL HIGH (ref 70–99)
Potassium: 4 mmol/L (ref 3.5–5.1)
Sodium: 137 mmol/L (ref 135–145)

## 2020-07-18 NOTE — Progress Notes (Signed)
Advanced Heart Failure Clinic Note   Date:  07/18/2020   ID:  William Davila, DOB Apr 03, 1966, MRN [id number]  Location: Home  Provider location: [facility] Type of Visit: Established patient  PCP:  Nolene Ebbs, MD  Cardiologist:  No primary care provider on file. Primary HF: Kore Madlock  Chief Complaint: Heart Failure follow-up   History of Present Illness:  William Davila is a 55 y.o. male with systolic heart failure , cardiomyopathy diagnosed 2001, hyperlipidemia, HTN, and obesity. S/P St Jude single chamber ICD 10/2011. Intolerant Bidil due to headaches.    Discharged from [facility] 05/10/2011 Discharge weight 328.  Massive volume over load and low output. Cardiac output increased inotropes. Medications initiated ace, beta blocker , digoxin, and torsemide.   He presents for regular f/u.  I have not seen him since 9/20. Main compliant is pain in his left shoulder. Going to PT. Denies CP, orthopnea or PND. Says breathing is good. No SOB. Able to do all ADLs without any problem. No edema. Complaint with meds.    Echo 9/20 EF 25-30% Personally reviewed  Echo 08/2011 EF 20-25% Echo 06/20/14 EF 25-30% RV normal  Echo 07/12/16 EF 25-30%   William Davila denies symptoms worrisome for COVID 19.   Past Medical History:  Diagnosis Date  . AICD (automatic cardioverter/defibrillator) present   . Arthritis    knee's  . Cardiomyopathy ([place])   . Chronic systolic heart failure ([place])    Diagnosed 2001; prior patient of Dr. Linard Millers with [name] - discharged from practice  . Dilated cardiomyopathy (H[facility])    LVEF 10-20%  . Essential hypertension, benign   . Gout   . Hyperlipidemia   . Hypertension   . 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 Morbid obesity ([place])   . Presence of combination internal cardiac defibrillator (ICD) and pacemaker   . Presence of permanent cardiac pacemaker   . VT (ventricular tachycardia) ([place]) 06/07/2011    Past Surgical History:  Procedure Laterality Date  . BIOPSY  12/27/2017   Procedure: BIOPSY;  Surgeon: Yetta Flock, MD;  Location: [facility];  Service: Gastroenterology;;  . COLONOSCOPY WITH PROPOFOL N/A 12/27/2017   Procedure: COLONOSCOPY WITH PROPOFOL;  Surgeon: Yetta Flock, MD;  Location: [facility];  Service: Gastroenterology;  Laterality: N/A;  . ICD  10/27/2011  . ICD GENERATOR CHANGEOUT N/A 02/22/2020   Procedure: ICD GENERATOR CHANGEOUT;  Surgeon: Deboraha Sprang, MD;  Location: [place] [facility];  Service: Cardiovascular;  Laterality: N/A;  . IMPLANTABLE CARDIOVERTER DEFIBRILLATOR IMPLANT N/A 10/27/2011   Procedure: IMPLANTABLE CARDIOVERTER DEFIBRILLATOR IMPLANT;  Surgeon: Deboraha Sprang, MD;  Location: [facility] CATH LAB;  Service: Cardiovascular;  Laterality: N/A;  . POLYPECTOMY  12/27/2017   Procedure: POLYPECTOMY;  Surgeon: Yetta Flock, MD;  Location: [facility];  Service: Gastroenterology;;  . RIGHT/LEFT HEART CATH AND CORONARY ANGIOGRAPHY N/A 03/01/2019   Procedure: RIGHT/LEFT HEART CATH AND CORONARY ANGIOGRAPHY;  Surgeon: Jolaine Artist, MD;  Location: [place] [facility];  Service: Cardiovascular;  Laterality: N/A;     Current Outpatient Medications  Medication Sig Dispense Refill  . acetaminophen (TYLENOL) 500 MG tablet Take 1,000 mg by mouth every 6 (six) hours as needed for mild pain or moderate pain. For pain    . allopurinol (ZYLOPRIM) 300 MG tablet Take 300 mg by mouth daily.   1  . carvedilol (COREG) 25 MG tablet TAKE 1 TABLET(25 MG) BY MOUTH TWICE  DAILY WITH A MEAL 60 tablet 0  . clotrimazole-betamethasone (LOTRISONE) cream Apply 1 application topically daily as needed (Rash).    . colchicine 0.6 MG tablet Take 1 tablet by mouth 2 (two) times daily.    . digoxin (LANOXIN) 0.125 MG tablet TAKE 1 TABLET(125 MCG) BY MOUTH DAILY 30 tablet 0  . naproxen (NAPROSYN) 500 MG tablet Take 500 mg by mouth 2 (two) times daily as needed (pain).    .  potassium chloride SA (KLOR-CON) 20 MEQ tablet TAKE 1 TABLET(20 MEQ) BY MOUTH TWICE DAILY 180 tablet 3  . sacubitril-valsartan (ENTRESTO) 97-103 MG Take 1 tablet by mouth 2 (two) times daily. 60 tablet 2  . spironolactone (ALDACTONE) 25 MG tablet TAKE 1 TABLET(25 MG) BY MOUTH DAILY 30 tablet 0  . tiZANidine (ZANAFLEX) 4 MG tablet Take 4 mg by mouth 2 (two) times daily as needed for muscle spasms (Back).     . torsemide (DEMADEX) 20 MG tablet Take 3 tablets (60 mg total) by mouth once for 1 dose. 60 tablet 0  . Vitamin D, Ergocalciferol, (DRISDOL) 1.25 MG (50000 UNIT) CAPS capsule Take 50,000 Units by mouth once a week.     No current facility-administered medications for this encounter.    Allergies:   Hydralazine, Beta adrenergic blockers, and Isosorb dinitrate-hydralazine   Social History:  The patient  reports that he has never smoked. He has never used smokeless tobacco. He reports current alcohol use of about 2.0 - 3.0 standard drinks of alcohol per week. He reports that he does not use drugs.   Family History:  The patient's family history includes Hypertension in his mother.   ROS:  Please see the history of present illness.   All other systems are personally reviewed and negative.   Vitals:   07/18/20 1106  BP: 118/70  Pulse: 84  SpO2: 96%  Weight: (!) 180.2 kg (397 lb 3.2 oz)  Height: 5\' 10"  (1.778 m)    Exam:  General:  Obese Well appearing. No resp difficulty HEENT: normal Neck: supple. no JVD. Carotids 2+ bilat; no bruits. No lymphadenopathy or thryomegaly appreciated. Cor: PMI nondisplaced. Regular rate & rhythm. No rubs, gallops or murmurs. Lungs: clear Abdomen: obese soft, nontender, nondistended. No hepatosplenomegaly. No bruits or masses. Good bowel sounds. Extremities: no cyanosis, clubbing, rash, edema Neuro: alert & orientedx3, cranial nerves grossly intact. moves all 4 extremities w/o difficulty. Affect pleasant   Recent Labs: 02/19/2020: BUN 16; Creatinine,  Ser 1.28; Hemoglobin 13.6; Platelets 413; Potassium 4.5; Sodium 138  Personally reviewed   Wt Readings from Last 3 Encounters:  07/18/20 (!) 180.2 kg (397 lb 3.2 oz)  06/03/20 (!) 178.3 kg (393 lb)  02/22/20 (!) 138.3 kg (305 lb)    ICD interrogated in Clinic: No VT/VF.  Volume status ok. Activity level 2 hoursPersonally reviewed   ASSESSMENT AND PLAN:  1. Chronic Systolic heart Failure- Medtronic Single lead ICD5/2013   - Echo 06/2016 LVEF 20-25%. Presumed NICM.  - Echo 9/21 EF 25-30%  - Cath 9/20 no CAD  - Stable NYHA II - Volume status ok - Continue Torsemide 60 mg daily.  - Continue Carvedilol 25 mg BID - Continue Digoxin 0.125 mg daily. Last level 0.4 in 2/20 - Continue Entresto 97/103 mg BID.   - Continue spironolactone 25 mg daily.  - Continue potassium 54meQ BID. - Intolerant to Bidil due to severe headaches. - We referred him to Dr. Caryl Comes. Had gen change in 9/21 but did not think he qualified for  upgrade to CRT with IVCD - We discussed Wilder Glade but he wants to wait for now.   2.Morbid Obesity - Body mass index is 56.99 kg/m. - He is minimally active. Encouraged weight loss and more activity  3. Probable OSA - refuses sleep study   Signed, Glori Bickers, MD  07/18/2020 11:47 AM  Advanced Heart Failure [place] [address] [phone] (office) [phone] (fax)

## 2020-07-18 NOTE — Addendum Note (Signed)
Encounter addended by: Malena Edman, RN on: 07/18/2020 12:00 PM  Actions taken: Order list changed, Diagnosis association updated, Charge Capture section accepted, Clinical Note Signed

## 2020-07-18 NOTE — Patient Instructions (Signed)
Labs done today, your results will be available in MyChart, we will contact you for abnormal readings.  Please call our office in July 2022 to schedule your follow up appointment and echocardiogram  Your physician has requested that you have an echocardiogram. Echocardiography is a painless test that uses sound waves to create images of your heart. It provides your doctor with information about the size and shape of your heart and how well your heart's chambers and valves are working. This procedure takes approximately one hour. There are no restrictions for this procedure.  If you have any questions or concerns before your next appointment please send [place] a message through [place] or call our office at [phone].    TO LEAVE A MESSAGE FOR THE NURSE SELECT OPTION 2, PLEASE LEAVE A MESSAGE INCLUDING: . YOUR NAME . DATE OF BIRTH . CALL BACK NUMBER . REASON FOR CALL**this is important as we prioritize the call backs  YOU WILL RECEIVE A CALL BACK THE SAME DAY AS LONG AS YOU CALL BEFORE 4:00 PM

## 2020-07-29 ENCOUNTER — Other Ambulatory Visit (HOSPITAL_COMMUNITY): Payer: Self-pay | Admitting: Internal Medicine

## 2020-07-29 DIAGNOSIS — I10 Essential (primary) hypertension: Secondary | ICD-10-CM

## 2020-07-29 DIAGNOSIS — I472 Ventricular tachycardia, unspecified: Secondary | ICD-10-CM

## 2020-07-29 DIAGNOSIS — I5022 Chronic systolic (congestive) heart failure: Secondary | ICD-10-CM

## 2020-07-30 ENCOUNTER — Other Ambulatory Visit (HOSPITAL_COMMUNITY): Payer: Self-pay

## 2020-07-30 DIAGNOSIS — I472 Ventricular tachycardia, unspecified: Secondary | ICD-10-CM

## 2020-07-30 MED ORDER — DIGOXIN 125 MCG PO TABS: 0.1250 mg | ORAL_TABLET | Freq: Every day | ORAL | 1 refills | Status: DC

## 2020-08-08 ENCOUNTER — Other Ambulatory Visit (HOSPITAL_COMMUNITY): Payer: Self-pay | Admitting: *Deleted

## 2020-08-08 MED ORDER — TORSEMIDE 20 MG PO TABS: 60.0000 mg | ORAL_TABLET | Freq: Every day | ORAL | 3 refills | Status: DC

## 2020-08-22 ENCOUNTER — Ambulatory Visit (INDEPENDENT_AMBULATORY_CARE_PROVIDER_SITE_OTHER): Payer: Medicare Other

## 2020-08-22 DIAGNOSIS — I472 Ventricular tachycardia, unspecified: Secondary | ICD-10-CM

## 2020-08-25 LAB — CUP PACEART REMOTE DEVICE CHECK
Battery Remaining Longevity: 134 mo
Battery Voltage: 3.06 V
Brady Statistic RV Percent Paced: 0 %
Date Time Interrogation Session: 20220311133602
HighPow Impedance: 77 Ohm
HighPow Impedance: 93 Ohm
Implantable Lead Implant Date: 20130515
Implantable Lead Location: 753860
Implantable Lead Model: 7121
Implantable Pulse Generator Implant Date: 20210910
Lead Channel Impedance Value: 1501 Ohm
Lead Channel Impedance Value: 304 Ohm
Lead Channel Pacing Threshold Amplitude: 0.75 V
Lead Channel Pacing Threshold Pulse Width: 0.4 ms
Lead Channel Sensing Intrinsic Amplitude: 10.125 mV
Lead Channel Sensing Intrinsic Amplitude: 10.125 mV
Lead Channel Setting Pacing Amplitude: 2 V
Lead Channel Setting Pacing Pulse Width: 0.4 ms
Lead Channel Setting Sensing Sensitivity: 0.3 mV

## 2020-08-29 NOTE — Progress Notes (Signed)
Remote ICD transmission.   

## 2020-09-08 ENCOUNTER — Telehealth (HOSPITAL_COMMUNITY): Payer: Self-pay | Admitting: *Deleted

## 2020-09-08 NOTE — Telephone Encounter (Signed)
Pt left vm requesting call about bp med. I called pt back no answer/left vm requesting return call.

## 2020-09-08 NOTE — Telephone Encounter (Signed)
Pt returned my call stating his bp was 97/53 but he is asymptomatic and feels fine. pts pcp asked him to call us about bp being low. Pt advised to keep a bp log take bp same time every morning 1-2 hours after taking medication and to call our office with readings after 1 week. Also advised pt to call if he began to feel symptomatic (dizzy, light headed, weak, fatigued). Pt aware and agreeable with plan.

## 2020-10-20 ENCOUNTER — Other Ambulatory Visit (HOSPITAL_COMMUNITY): Payer: Self-pay | Admitting: Internal Medicine

## 2020-10-20 DIAGNOSIS — I472 Ventricular tachycardia, unspecified: Secondary | ICD-10-CM

## 2020-10-20 DIAGNOSIS — I10 Essential (primary) hypertension: Secondary | ICD-10-CM

## 2020-10-20 DIAGNOSIS — I5022 Chronic systolic (congestive) heart failure: Secondary | ICD-10-CM

## 2020-10-23 ENCOUNTER — Other Ambulatory Visit (HOSPITAL_COMMUNITY): Payer: Self-pay | Admitting: *Deleted

## 2020-10-23 MED ORDER — ENTRESTO 97-103 MG PO TABS: 1.0000 | ORAL_TABLET | Freq: Two times a day (BID) | ORAL | 2 refills | Status: DC

## 2021-01-19 ENCOUNTER — Other Ambulatory Visit (HOSPITAL_COMMUNITY): Payer: Self-pay | Admitting: Internal Medicine

## 2021-02-12 ENCOUNTER — Other Ambulatory Visit: Payer: Self-pay | Admitting: Internal Medicine

## 2021-02-13 LAB — COMPLETE METABOLIC PANEL WITH GFR
AG Ratio: 1.2 (calc) (ref 1.0–2.5)
ALT: 13 U/L (ref 9–46)
AST: 14 U/L (ref 10–35)
Albumin: 4 g/dL (ref 3.6–5.1)
Alkaline phosphatase (APISO): 105 U/L (ref 35–144)
BUN/Creatinine Ratio: 13 (calc) (ref 6–22)
BUN: 21 mg/dL (ref 7–25)
CO2: 25 mmol/L (ref 20–32)
Calcium: 9.2 mg/dL (ref 8.6–10.3)
Chloride: 102 mmol/L (ref 98–110)
Creat: 1.66 mg/dL — ABNORMAL HIGH (ref 0.70–1.30)
Globulin: 3.3 g/dL (calc) (ref 1.9–3.7)
Glucose, Bld: 108 mg/dL — ABNORMAL HIGH (ref 65–99)
Potassium: 4.7 mmol/L (ref 3.5–5.3)
Sodium: 137 mmol/L (ref 135–146)
Total Bilirubin: 0.5 mg/dL (ref 0.2–1.2)
Total Protein: 7.3 g/dL (ref 6.1–8.1)
eGFR: 49 mL/min/{1.73_m2} — ABNORMAL LOW (ref >=60)

## 2021-02-13 LAB — CBC
HCT: 37.9 % — ABNORMAL LOW (ref 38.5–50.0)
Hemoglobin: 12.6 g/dL — ABNORMAL LOW (ref 13.2–17.1)
MCH: 29.6 pg (ref 27.0–33.0)
MCHC: 33.2 g/dL (ref 32.0–36.0)
MCV: 89.2 fL (ref 80.0–100.0)
MPV: 10.1 fL (ref 7.5–12.5)
Platelets: 436 10*3/uL — ABNORMAL HIGH (ref 140–400)
RBC: 4.25 10*6/uL (ref 4.20–5.80)
RDW: 15 % (ref 11.0–15.0)
WBC: 13.2 10*3/uL — ABNORMAL HIGH (ref 3.8–10.8)

## 2021-02-13 LAB — PSA: PSA: 0.32 ng/mL (ref <=4.00)

## 2021-02-13 LAB — LIPID PANEL
Cholesterol: 155 mg/dL (ref <200)
HDL: 34 mg/dL — ABNORMAL LOW (ref >=40)
LDL Cholesterol (Calc): 98 mg/dL (calc)
Non-HDL Cholesterol (Calc): 121 mg/dL (calc) (ref <130)
Total CHOL/HDL Ratio: 4.6 (calc) (ref <5.0)
Triglycerides: 124 mg/dL (ref <150)

## 2021-02-13 LAB — TSH: TSH: 2 mIU/L (ref 0.40–4.50)

## 2021-02-13 LAB — VITAMIN D 25 HYDROXY (VIT D DEFICIENCY, FRACTURES): Vit D, 25-Hydroxy: 56 ng/mL (ref 30–100)

## 2021-02-13 LAB — URIC ACID: Uric Acid, Serum: 6.7 mg/dL (ref 4.0–8.0)

## 2021-04-18 ENCOUNTER — Other Ambulatory Visit (HOSPITAL_COMMUNITY): Payer: Self-pay | Admitting: Internal Medicine

## 2021-04-18 DIAGNOSIS — I472 Ventricular tachycardia, unspecified: Secondary | ICD-10-CM

## 2021-05-22 ENCOUNTER — Telehealth: Payer: Self-pay

## 2021-05-22 NOTE — Telephone Encounter (Signed)
The patient was scheduled for a transmission but his monitor is no longer working. I ordered the patient a new monitor and he should receive it in 7-10 business days.

## 2021-05-29 ENCOUNTER — Ambulatory Visit (INDEPENDENT_AMBULATORY_CARE_PROVIDER_SITE_OTHER): Payer: Medicare Other

## 2021-05-29 DIAGNOSIS — I472 Ventricular tachycardia, unspecified: Secondary | ICD-10-CM

## 2021-05-29 LAB — CUP PACEART REMOTE DEVICE CHECK
Battery Remaining Longevity: 129 mo
Battery Voltage: 3.04 V
Brady Statistic RV Percent Paced: 0 %
Date Time Interrogation Session: 20221216110252
HighPow Impedance: 76 Ohm
HighPow Impedance: 98 Ohm
Implantable Lead Implant Date: 20130515
Implantable Lead Location: 753860
Implantable Lead Model: 7121
Implantable Pulse Generator Implant Date: 20210910
Lead Channel Impedance Value: 1558 Ohm
Lead Channel Impedance Value: 304 Ohm
Lead Channel Pacing Threshold Amplitude: 0.625 V
Lead Channel Pacing Threshold Pulse Width: 0.4 ms
Lead Channel Sensing Intrinsic Amplitude: 11.375 mV
Lead Channel Sensing Intrinsic Amplitude: 11.375 mV
Lead Channel Setting Pacing Amplitude: 2 V
Lead Channel Setting Pacing Pulse Width: 0.4 ms
Lead Channel Setting Sensing Sensitivity: 0.3 mV

## 2021-06-10 NOTE — Progress Notes (Signed)
Remote ICD transmission.   

## 2021-07-13 ENCOUNTER — Other Ambulatory Visit (HOSPITAL_COMMUNITY): Payer: Self-pay | Admitting: *Deleted

## 2021-07-17 ENCOUNTER — Other Ambulatory Visit (HOSPITAL_COMMUNITY): Payer: Self-pay | Admitting: Internal Medicine

## 2021-07-17 DIAGNOSIS — I472 Ventricular tachycardia, unspecified: Secondary | ICD-10-CM

## 2021-08-16 ENCOUNTER — Other Ambulatory Visit (HOSPITAL_COMMUNITY): Payer: Self-pay | Admitting: Internal Medicine

## 2021-08-28 ENCOUNTER — Ambulatory Visit (INDEPENDENT_AMBULATORY_CARE_PROVIDER_SITE_OTHER): Payer: Medicare Other

## 2021-08-28 DIAGNOSIS — I472 Ventricular tachycardia, unspecified: Secondary | ICD-10-CM

## 2021-08-28 LAB — CUP PACEART REMOTE DEVICE CHECK
Battery Remaining Longevity: 127 mo
Battery Voltage: 3.04 V
Brady Statistic RV Percent Paced: 0.01 %
Date Time Interrogation Session: 20230317022602
HighPow Impedance: 106 Ohm
HighPow Impedance: 83 Ohm
Implantable Lead Implant Date: 20130515
Implantable Lead Location: 753860
Implantable Lead Model: 7121
Implantable Pulse Generator Implant Date: 20210910
Lead Channel Impedance Value: 1672 Ohm
Lead Channel Impedance Value: 323 Ohm
Lead Channel Pacing Threshold Amplitude: 0.75 V
Lead Channel Pacing Threshold Pulse Width: 0.4 ms
Lead Channel Sensing Intrinsic Amplitude: 10 mV
Lead Channel Sensing Intrinsic Amplitude: 10 mV
Lead Channel Setting Pacing Amplitude: 2 V
Lead Channel Setting Pacing Pulse Width: 0.4 ms
Lead Channel Setting Sensing Sensitivity: 0.3 mV

## 2021-09-01 ENCOUNTER — Other Ambulatory Visit (HOSPITAL_COMMUNITY): Payer: Self-pay | Admitting: Internal Medicine

## 2021-09-01 DIAGNOSIS — I472 Ventricular tachycardia, unspecified: Secondary | ICD-10-CM

## 2021-09-03 ENCOUNTER — Other Ambulatory Visit (HOSPITAL_COMMUNITY): Payer: Self-pay

## 2021-09-03 NOTE — Progress Notes (Signed)
Remote ICD transmission.   

## 2021-09-15 ENCOUNTER — Other Ambulatory Visit (HOSPITAL_COMMUNITY): Payer: Self-pay | Admitting: Internal Medicine

## 2021-09-17 ENCOUNTER — Other Ambulatory Visit: Payer: Self-pay

## 2021-09-17 MED ORDER — POTASSIUM CHLORIDE CRYS ER 20 MEQ PO TBCR: 20.0000 meq | EXTENDED_RELEASE_TABLET | Freq: Two times a day (BID) | ORAL | 0 refills | Status: DC

## 2021-09-29 ENCOUNTER — Ambulatory Visit (HOSPITAL_COMMUNITY)
Admission: RE | Admit: 2021-09-29 | Discharge: 2021-09-29 | Disposition: A | Discharge status: Home / Self Care | Payer: Medicare Other | Source: Ambulatory Visit | Attending: Internal Medicine | Admitting: Internal Medicine

## 2021-09-29 DIAGNOSIS — I110 Hypertensive heart disease with heart failure: Secondary | ICD-10-CM | POA: Insufficient documentation

## 2021-09-29 DIAGNOSIS — I10 Essential (primary) hypertension: Secondary | ICD-10-CM | POA: Diagnosis not present

## 2021-09-29 DIAGNOSIS — Z6841 Body Mass Index (BMI) 40.0 and over, adult: Secondary | ICD-10-CM | POA: Insufficient documentation

## 2021-09-29 DIAGNOSIS — I5022 Chronic systolic (congestive) heart failure: Secondary | ICD-10-CM | POA: Diagnosis not present

## 2021-09-29 DIAGNOSIS — Z79899 Other long term (current) drug therapy: Secondary | ICD-10-CM | POA: Diagnosis not present

## 2021-09-29 DIAGNOSIS — E785 Hyperlipidemia, unspecified: Secondary | ICD-10-CM | POA: Diagnosis not present

## 2021-09-29 DIAGNOSIS — I472 Ventricular tachycardia, unspecified: Secondary | ICD-10-CM | POA: Diagnosis not present

## 2021-09-29 LAB — COMPREHENSIVE METABOLIC PANEL
ALT: 14 U/L (ref 0–44)
AST: 15 U/L (ref 15–41)
Albumin: 3.8 g/dL (ref 3.5–5.0)
Alkaline Phosphatase: 84 U/L (ref 38–126)
Anion gap: 9 (ref 5–15)
BUN: 26 mg/dL — ABNORMAL HIGH (ref 6–20)
CO2: 23 mmol/L (ref 22–32)
Calcium: 9.4 mg/dL (ref 8.9–10.3)
Chloride: 104 mmol/L (ref 98–111)
Creatinine, Ser: 1.48 mg/dL — ABNORMAL HIGH (ref 0.61–1.24)
GFR, Estimated: 56 mL/min — ABNORMAL LOW (ref >60)
Glucose, Bld: 92 mg/dL (ref 70–99)
Potassium: 4 mmol/L (ref 3.5–5.1)
Sodium: 136 mmol/L (ref 135–145)
Total Bilirubin: 0.7 mg/dL (ref 0.3–1.2)
Total Protein: 8.4 g/dL — ABNORMAL HIGH (ref 6.5–8.1)

## 2021-09-29 LAB — CBC
HCT: 40.9 % (ref 39.0–52.0)
Hemoglobin: 13.2 g/dL (ref 13.0–17.0)
MCH: 29.6 pg (ref 26.0–34.0)
MCHC: 32.3 g/dL (ref 30.0–36.0)
MCV: 91.7 fL (ref 80.0–100.0)
Platelets: 440 10*3/uL — ABNORMAL HIGH (ref 150–400)
RBC: 4.46 MIL/uL (ref 4.22–5.81)
RDW: 15 % (ref 11.5–15.5)
WBC: 12.8 10*3/uL — ABNORMAL HIGH (ref 4.0–10.5)
nRBC: 0 % (ref 0.0–0.2)

## 2021-09-29 LAB — DIGOXIN LEVEL: Digoxin Level: 0.9 ng/mL (ref 0.8–2.0)

## 2021-09-29 NOTE — Patient Instructions (Addendum)
Continue current medications ? ?Labs done today, we will call you for abnormal results ? ?Your physician recommends that you schedule a follow-up appointment in: 1 year, *PLEASE CALL OUR OFFICE IN February 2024 TO SCHEDULE THIS APPOINTMENT ? ?If you have any questions or concerns before your next appointment please send [place] a message through [place] or call our office at [phone].   ? ?TO LEAVE A MESSAGE FOR THE NURSE SELECT OPTION 2, PLEASE LEAVE A MESSAGE INCLUDING: ?YOUR NAME ?DATE OF BIRTH ?CALL BACK NUMBER ?REASON FOR CALL**this is important as we prioritize the call backs ? ?YOU WILL RECEIVE A CALL BACK THE SAME DAY AS LONG AS YOU CALL BEFORE 4:00 PM ? ?At the [facility], you and your health needs are our priority. As part of our continuing mission to provide you with exceptional heart care, we have created designated Provider Care Teams. These Care Teams include your primary Cardiologist (physician) and Advanced Practice Providers (APPs- Physician Assistants and Nurse Practitioners) who all work together to provide you with the care you need, when you need it.  ? ?You may see any of the following providers on your designated Care Team at your next follow up: ?Dr Glori Bickers ?Dr Loralie Champagne ?Darrick Grinder, NP ?Lyda Jester, PA ?Jessica Milford,NP ?Marlyce Huge, PA ?Audry Riles, PharmD ? ? ?Please be sure to bring in all your medications bottles to every appointment.  ? ?

## 2021-09-29 NOTE — Progress Notes (Signed)
?  ? ?Advanced Heart Failure Clinic Note ? ? ?Date:  09/29/2021  ? ?ID:  CHING RABIDEAU, DOB [birth year], MRN [id number]  Location: Home  ?Provider location: [facility] ?Type of Visit: Established patient ? ?PCP:  Nolene Ebbs, MD  ?Cardiologist:  None ?Primary HF: Brett Darko ? ?Chief Complaint: Heart Failure follow-up ?  ?History of Present Illness: ? ?William Davila is a 56 y.o. male with systolic heart failure , cardiomyopathy diagnosed 2001, hyperlipidemia, HTN, and obesity. S/P St Jude single chamber ICD 10/2011. Intolerant Bidil due to headaches.   ? ?Discharged from [facility] 05/10/2011 Discharge weight 328.  Massive volume over load and low output. Cardiac output increased inotropes. Medications initiated ace, beta blocker , digoxin, and torsemide.  ?  ?He presents for regular f/u.  I have not seen him since 2/22. Says he feels good. Denies CP or SOB. Has joined the [employer] and is [facility] the track. Reports compliance with meds. No edema, orthopnea or PND. ? ?ICD interrogated today in clinic. 36 episodes NSVT. 1 self-resolved VT. Activity level 1.3hr. Fluid ok Personally reviewed ? ?  ?Echo 9/20 EF 25-30% ? ?Echo 08/2011 EF 20-25% ?Echo 06/20/14 EF 25-30% RV normal  ?Echo 07/12/16 EF 25-30% ?  ? ?Darrick Grinder denies symptoms worrisome for COVID 19.  ? ?Past Medical History:  ?Diagnosis Date  ? AICD (automatic cardioverter/defibrillator) present   ? Arthritis   ? knee's  ? Cardiomyopathy ([place])   ? Chronic systolic heart failure ([place])   ? Diagnosed 2001; prior patient of Dr. Linard Millers with [name] - discharged from practice  ? Dilated cardiomyopathy ([place])   ? LVEF 10-20%  ? Essential hypertension, benign   ? Gout   ? Hyperlipidemia   ? Hypertension   ? ICD (implantable cardiac defibrillator) in place 10/27/2011  ? Morbid obesity ([place])   ? Presence of combination internal cardiac defibrillator (ICD) and pacemaker   ? Presence of permanent cardiac pacemaker   ? VT (ventricular tachycardia) ([place])  06/07/2011  ? ?Past Surgical History:  ?Procedure Laterality Date  ? BIOPSY  12/27/2017  ? Procedure: BIOPSY;  Surgeon: Yetta Flock, MD;  Location: [facility];  Service: Gastroenterology;;  ? COLONOSCOPY WITH PROPOFOL N/A 12/27/2017  ? Procedure: COLONOSCOPY WITH PROPOFOL;  Surgeon: Yetta Flock, MD;  Location: [facility];  Service: Gastroenterology;  Laterality: N/A;  ? ICD  10/27/2011  ? ICD GENERATOR CHANGEOUT N/A 02/22/2020  ? Procedure: ICD GENERATOR CHANGEOUT;  Surgeon: Deboraha Sprang, MD;  Location: [place] [facility];  Service: Cardiovascular;  Laterality: N/A;  ? IMPLANTABLE CARDIOVERTER DEFIBRILLATOR IMPLANT N/A 10/27/2011  ? Procedure: IMPLANTABLE CARDIOVERTER DEFIBRILLATOR IMPLANT;  Surgeon: Deboraha Sprang, MD;  Location: [facility];  Service: Cardiovascular;  Laterality: N/A;  ? POLYPECTOMY  12/27/2017  ? Procedure: POLYPECTOMY;  Surgeon: Yetta Flock, MD;  Location: [facility];  Service: Gastroenterology;;  ? RIGHT/LEFT HEART CATH AND CORONARY ANGIOGRAPHY N/A 03/01/2019  ? Procedure: RIGHT/LEFT HEART CATH AND CORONARY ANGIOGRAPHY;  Surgeon: Jolaine Artist, MD;  Location: [place] [facility];  Service: Cardiovascular;  Laterality: N/A;  ? ? ? ?Current Outpatient Medications  ?Medication Sig Dispense Refill  ? acetaminophen (TYLENOL) 500 MG tablet Take 1,000 mg by mouth every 6 (six) hours as needed for mild pain or moderate pain. For pain    ? allopurinol (ZYLOPRIM) 300 MG tablet Take 300 mg by mouth daily.   1  ? carvedilol (COREG) 25 MG tablet TAKE 1  TABLET(25 MG) BY MOUTH TWICE DAILY WITH A MEAL 180 tablet 3  ? clotrimazole-betamethasone (LOTRISONE) cream Apply 1 application topically daily as needed (Rash).    ? colchicine 0.6 MG tablet Take 1 tablet by mouth 2 (two) times daily.    ? digoxin (LANOXIN) 0.125 MG tablet TAKE 1 TABLET(0.125 MG) BY MOUTH DAILY 90 tablet 3  ? ENTRESTO 97-103 MG TAKE 1 TABLET BY MOUTH TWICE DAILY 60 tablet 11  ? naproxen (NAPROSYN) 500 MG  tablet Take 500 mg by mouth 2 (two) times daily as needed (pain).    ? potassium chloride SA (KLOR-CON M) 20 MEQ tablet Take 1 tablet (20 mEq total) by mouth 2 (two) times daily. Please schedule appt for future refills. 1st attempt 180 tablet 0  ? spironolactone (ALDACTONE) 25 MG tablet TAKE 1 TABLET(25 MG) BY MOUTH DAILY 90 tablet 3  ? tiZANidine (ZANAFLEX) 4 MG tablet Take 4 mg by mouth 2 (two) times daily as needed for muscle spasms (Back).     ? torsemide (DEMADEX) 20 MG tablet TAKE 3 TABLETS(60 MG) BY MOUTH DAILY 270 tablet 3  ? Vitamin D, Ergocalciferol, (DRISDOL) 1.25 MG (50000 UNIT) CAPS capsule Take 50,000 Units by mouth once a week.    ? ?No current facility-administered medications for this encounter.  ? ? ?Allergies:   Hydralazine, Beta adrenergic blockers, and Isosorb dinitrate-hydralazine  ? ?Social History:  The patient  reports that he has never smoked. He has never used smokeless tobacco. He reports current alcohol use of about 2.0 - 3.0 standard drinks per week. He reports that he does not use drugs.  ? ?Family History:  The patient's family history includes Hypertension in his mother.  ? ?ROS:  Please see the history of present illness.   All other systems are personally reviewed and negative.  ? ?Vitals:  ? 09/29/21 1304  ?BP: 103/72  ?Pulse: 96  ?SpO2: 98%  ?Weight: (!) 176.1 kg (388 lb 2 oz)  ? ? ?Exam:  ?General:  Well appearing. No resp difficulty ?HEENT: normal ?Neck: supple. no JVD. Carotids 2+ bilat; no bruits. No lymphadenopathy or thryomegaly appreciated. ?Cor: PMI nondisplaced. Regular rate & rhythm. No rubs, gallops or murmurs. ?Lungs: clear ?Abdomen: obese, soft, nontender, nondistended. No hepatosplenomegaly. No bruits or masses. Good bowel sounds. ?Extremities: no cyanosis, clubbing, rash, edema ?Neuro: alert & orientedx3, cranial nerves grossly intact. moves all 4 extremities w/o difficulty. Affect pleasant ? ? ?Recent Labs: ?02/12/2021: ALT 13; BUN 21; Creat 1.66; Hemoglobin 12.6;  Platelets 436; Potassium 4.7; Sodium 137; TSH 2.00  ?Personally reviewed  ? ?Wt Readings from Last 3 Encounters:  ?09/29/21 (!) 176.1 kg (388 lb 2 oz)  ?07/18/20 (!) 180.2 kg (397 lb 3.2 oz)  ?06/03/20 (!) 178.3 kg (393 lb)  ?  ?ICD interrogated in Clinic: No VT/VF.  Volume status ok. Activity level 2 hoursPersonally reviewed ? ? ?ASSESSMENT AND PLAN: ? ?1. Chronic Systolic heart Failure- Medtronic Single lead ICD5/2013   ?- Echo 06/2016 LVEF 20-25%. Presumed NICM.  ?- Echo 9/21 EF 25-30%  ?- Cath 9/20 no CAD  ?- Stable NYHA II ?- Volume status ok ?- Continue Torsemide 60 mg daily.  ?- Continue Carvedilol 25 mg BID ?- Continue Digoxin 0.125 mg daily. ?- Continue Entresto 97/103 mg BID.   ?- Continue spironolactone 25 mg daily.  ?- Continue potassium 35mQ BID. ?- Intolerant to Bidil due to severe headaches. ?- We referred him to Dr. KCaryl Comes Had gen change in 9/21 but did not think he qualified  for upgrade to CRT with IVCD ?- We discussed [facility] Glade again but he is not interested ?  ?2.Morbid Obesity ?- Body mass index is 55.69 kg/m?. ?- Now on Ozempic ?- He has lost 10 pounds.  ? ?3. Probable OSA ?- not interested in sleep study ? ?Signed, ?Glori Bickers, MD  ?09/29/2021 ?1:29 PM ? ?[facility] Clinic ?[place] ?[address] ?[facility] ?[place] [address] ?[phone]) ?[phone]) ?

## 2021-09-29 NOTE — Addendum Note (Signed)
Encounter addended by: Scarlette Calico, RN on: 09/29/2021 1:39 PM ? Actions taken: Order list changed, Diagnosis association updated, Charge Capture section accepted, Clinical Note Signed

## 2021-10-01 ENCOUNTER — Telehealth (HOSPITAL_COMMUNITY): Payer: Self-pay | Admitting: Cardiology

## 2021-10-01 NOTE — Telephone Encounter (Signed)
-----   Message from Jolaine Artist, MD sent at 09/29/2021 10:10 PM EDT ----- ?Stop digoxin ?

## 2021-10-01 NOTE — Telephone Encounter (Signed)
Patient aware.

## 2021-10-02 ENCOUNTER — Encounter (HOSPITAL_COMMUNITY): Payer: Medicare Other | Admitting: Internal Medicine

## 2021-10-12 ENCOUNTER — Other Ambulatory Visit (HOSPITAL_COMMUNITY): Payer: Self-pay

## 2021-10-12 ENCOUNTER — Other Ambulatory Visit (HOSPITAL_COMMUNITY): Payer: Self-pay | Admitting: Internal Medicine

## 2021-10-12 DIAGNOSIS — I10 Essential (primary) hypertension: Secondary | ICD-10-CM

## 2021-10-12 DIAGNOSIS — I472 Ventricular tachycardia, unspecified: Secondary | ICD-10-CM

## 2021-10-12 DIAGNOSIS — I5022 Chronic systolic (congestive) heart failure: Secondary | ICD-10-CM

## 2021-10-12 MED ORDER — CARVEDILOL 25 MG PO TABS: ORAL_TABLET | ORAL | 3 refills | Status: DC

## 2021-11-27 ENCOUNTER — Ambulatory Visit (INDEPENDENT_AMBULATORY_CARE_PROVIDER_SITE_OTHER): Payer: Medicare Other

## 2021-11-27 DIAGNOSIS — I428 Other cardiomyopathies: Secondary | ICD-10-CM | POA: Diagnosis not present

## 2021-11-30 LAB — CUP PACEART REMOTE DEVICE CHECK
Battery Remaining Longevity: 125 mo
Battery Voltage: 3.04 V
Brady Statistic RV Percent Paced: 0 %
Date Time Interrogation Session: 20230616044224
HighPow Impedance: 78 Ohm
HighPow Impedance: 95 Ohm
Implantable Lead Implant Date: 20130515
Implantable Lead Location: 753860
Implantable Lead Model: 7121
Implantable Pulse Generator Implant Date: 20210910
Lead Channel Impedance Value: 1596 Ohm
Lead Channel Impedance Value: 266 Ohm
Lead Channel Pacing Threshold Amplitude: 0.625 V
Lead Channel Pacing Threshold Pulse Width: 0.4 ms
Lead Channel Sensing Intrinsic Amplitude: 9.125 mV
Lead Channel Sensing Intrinsic Amplitude: 9.125 mV
Lead Channel Setting Pacing Amplitude: 2 V
Lead Channel Setting Pacing Pulse Width: 0.4 ms
Lead Channel Setting Sensing Sensitivity: 0.3 mV

## 2021-12-21 ENCOUNTER — Other Ambulatory Visit: Payer: Self-pay

## 2021-12-21 NOTE — Telephone Encounter (Signed)
This is a CHF pt, Dr. Haroldine Laws

## 2021-12-23 ENCOUNTER — Other Ambulatory Visit (HOSPITAL_COMMUNITY): Payer: Self-pay | Admitting: *Deleted

## 2021-12-23 MED ORDER — POTASSIUM CHLORIDE CRYS ER 20 MEQ PO TBCR: 20.0000 meq | EXTENDED_RELEASE_TABLET | Freq: Two times a day (BID) | ORAL | 3 refills | Status: DC

## 2022-02-26 ENCOUNTER — Ambulatory Visit (INDEPENDENT_AMBULATORY_CARE_PROVIDER_SITE_OTHER): Payer: Medicare Other

## 2022-02-26 DIAGNOSIS — I428 Other cardiomyopathies: Secondary | ICD-10-CM

## 2022-02-27 LAB — CUP PACEART REMOTE DEVICE CHECK
Battery Remaining Longevity: 123 mo
Battery Voltage: 3.03 V
Brady Statistic RV Percent Paced: 0 %
Date Time Interrogation Session: 20230915052705
HighPow Impedance: 78 Ohm
HighPow Impedance: 96 Ohm
Implantable Lead Implant Date: 20130515
Implantable Lead Location: 753860
Implantable Lead Model: 7121
Implantable Pulse Generator Implant Date: 20210910
Lead Channel Impedance Value: 1615 Ohm
Lead Channel Impedance Value: 266 Ohm
Lead Channel Pacing Threshold Amplitude: 0.75 V
Lead Channel Pacing Threshold Pulse Width: 0.4 ms
Lead Channel Sensing Intrinsic Amplitude: 8.625 mV
Lead Channel Sensing Intrinsic Amplitude: 8.625 mV
Lead Channel Setting Pacing Amplitude: 2 V
Lead Channel Setting Pacing Pulse Width: 0.4 ms
Lead Channel Setting Sensing Sensitivity: 0.3 mV

## 2022-03-09 NOTE — Progress Notes (Signed)
Remote ICD transmission.   

## 2022-03-11 ENCOUNTER — Other Ambulatory Visit: Payer: Self-pay | Admitting: Internal Medicine

## 2022-03-12 LAB — CBC
HCT: 36.2 % — ABNORMAL LOW (ref 38.5–50.0)
Hemoglobin: 12.1 g/dL — ABNORMAL LOW (ref 13.2–17.1)
MCH: 30.5 pg (ref 27.0–33.0)
MCHC: 33.4 g/dL (ref 32.0–36.0)
MCV: 91.2 fL (ref 80.0–100.0)
MPV: 11 fL (ref 7.5–12.5)
Platelets: 362 10*3/uL (ref 140–400)
RBC: 3.97 10*6/uL — ABNORMAL LOW (ref 4.20–5.80)
RDW: 14.9 % (ref 11.0–15.0)
WBC: 13.7 10*3/uL — ABNORMAL HIGH (ref 3.8–10.8)

## 2022-03-12 LAB — COMPLETE METABOLIC PANEL WITH GFR
AG Ratio: 1.3 (calc) (ref 1.0–2.5)
ALT: 7 U/L — ABNORMAL LOW (ref 9–46)
AST: 9 U/L — ABNORMAL LOW (ref 10–35)
Albumin: 4.2 g/dL (ref 3.6–5.1)
Alkaline phosphatase (APISO): 99 U/L (ref 35–144)
BUN/Creatinine Ratio: 14 (calc) (ref 6–22)
BUN: 27 mg/dL — ABNORMAL HIGH (ref 7–25)
CO2: 29 mmol/L (ref 20–32)
Calcium: 10 mg/dL (ref 8.6–10.3)
Chloride: 101 mmol/L (ref 98–110)
Creat: 1.96 mg/dL — ABNORMAL HIGH (ref 0.70–1.30)
Globulin: 3.3 g/dL (calc) (ref 1.9–3.7)
Glucose, Bld: 108 mg/dL — ABNORMAL HIGH (ref 65–99)
Potassium: 5 mmol/L (ref 3.5–5.3)
Sodium: 140 mmol/L (ref 135–146)
Total Bilirubin: 0.4 mg/dL (ref 0.2–1.2)
Total Protein: 7.5 g/dL (ref 6.1–8.1)
eGFR: 40 mL/min/{1.73_m2} — ABNORMAL LOW (ref >=60)

## 2022-03-12 LAB — VITAMIN D 25 HYDROXY (VIT D DEFICIENCY, FRACTURES): Vit D, 25-Hydroxy: 82 ng/mL (ref 30–100)

## 2022-03-12 LAB — TSH: TSH: 0.81 mIU/L (ref 0.40–4.50)

## 2022-03-12 LAB — URIC ACID: Uric Acid, Serum: 6.3 mg/dL (ref 4.0–8.0)

## 2022-03-12 LAB — PSA: PSA: 0.84 ng/mL (ref <=4.00)

## 2022-05-07 ENCOUNTER — Encounter (HOSPITAL_COMMUNITY): Payer: Self-pay | Admitting: *Deleted

## 2022-05-07 ENCOUNTER — Ambulatory Visit (HOSPITAL_COMMUNITY)
Admission: EM | Admit: 2022-05-07 | Discharge: 2022-05-07 | Disposition: A | Discharge status: Home / Self Care | Payer: Medicare Other | Attending: Internal Medicine | Admitting: Internal Medicine

## 2022-05-07 DIAGNOSIS — K6289 Other specified diseases of anus and rectum: Secondary | ICD-10-CM | POA: Diagnosis not present

## 2022-05-07 DIAGNOSIS — I959 Hypotension, unspecified: Secondary | ICD-10-CM

## 2022-05-07 DIAGNOSIS — K602 Anal fissure, unspecified: Secondary | ICD-10-CM | POA: Diagnosis not present

## 2022-05-07 DIAGNOSIS — I5042 Chronic combined systolic (congestive) and diastolic (congestive) heart failure: Secondary | ICD-10-CM

## 2022-05-07 MED ORDER — HYDROCORTISONE 1 % EX CREA: TOPICAL_CREAM | CUTANEOUS | 0 refills | Status: DC

## 2022-05-07 MED ORDER — LIDOCAINE 5 % EX OINT: 1.0000 | TOPICAL_OINTMENT | CUTANEOUS | 0 refills | Status: DC | PRN

## 2022-05-07 NOTE — Discharge Instructions (Addendum)
Reduce the amount of carvedilol (Coreg) medication that you are taking by taking half tablet of carvedilol 2 times a day.  This means you will take 12.5 mg in the morning and 12.5 mg at bedtime.  Call to schedule an appointment with Dr. Haroldine Laws to schedule an appointment for soon as possible for reevaluation of your medications.  Place a small amount of lidocaine ointment to the rectum twice daily for the next 5 to 7 days to help with rectal irritation related to cut to the anal tissue.  Continue to drink plenty of water and increase the amount of fiber to your diet by adding whole grains, vegetables, and fruits.   If you develop any new or worsening symptoms or do not improve in the next 2 to 3 days, please return.  If your symptoms are severe, please go to the emergency room.  Follow-up with your primary care provider for further evaluation and management of your symptoms as well as ongoing wellness visits.  I hope you feel better!

## 2022-05-07 NOTE — ED Provider Notes (Signed)
[facility]    CSN: [id number] Arrival date & time: 05/07/22  1006      History   Chief Complaint Chief Complaint  Patient presents with   Rectal Pain    HPI William Davila is a 56 y.o. male.   Patient presents urgent care for evaluation of rectal irritation and pain that has been present for the last week.  He states pain to the rectal area is constant and worsened with bowel movements and sitting on his buttocks.  Denies rectal bleeding, blood/mucus to the stool, abdominal pain, nausea, vomiting, diarrhea, dizziness, recent/history of abdominal surgeries, recent antibiotic or steroid use, and history of hemorrhoids.  Denies history of anal fissures as well.  Pain can be described as a burning sensation to the rectal area.  He denies urinary symptoms and testicular pain.  Patient attempted use of some Tylenol this morning to help with pain and states this took the edge off but did not completely help with discomfort.  He has not attempted use of any over-the-counter creams or other remedies prior to arrival to urgent care for his rectal discomfort.  This has never happened in the past and he cannot identify any triggering factors for symptoms.  Of note, patient's blood pressure is soft with initial reading at 90s over 50s, then 100/50 manually taken by nursing staff.  He is not currently dizzy, denies shortness of breath, chest pain, heart palpitations, blurry vision, and recent viral URI symptoms/cough.  He does have an ICD in place due to history of ventricular tachycardia but states he has not felt it go off.  History of CHF cardiomyopathy, obesity, and hypertension.  He has not taken his blood pressure medication this morning. Blood pressures with home cuff have been reading slightly lower than normal at home as well but he attributed this to a glitch with his automatic home machine. No recent changes in medication. Sees Dr. Gala Romney cardiologist who manages his BP medications  and heart failure. Next appointment with Dr. Gala Romney scheduled for next year. Takes carvedilol , spironolactone, torsemide, and Entresto daily.      Past Medical History:  Diagnosis Date   AICD (automatic cardioverter/defibrillator) present    Arthritis    knee's   Cardiomyopathy (HCC)    Chronic systolic heart failure (HCC)    Diagnosed 2001; prior patient of Dr. Mendel Ryder with [facility] - discharged from practice   Dilated cardiomyopathy (HCC)    LVEF 10-20%   Essential hypertension, benign    Gout    Hyperlipidemia    Hypertension    ICD (implantable cardiac defibrillator) in place 10/27/2011   Morbid obesity (HCC)    Presence of combination internal cardiac defibrillator (ICD) and pacemaker    Presence of permanent cardiac pacemaker    VT (ventricular tachycardia) (HCC) 06/07/2011    Patient Active Problem List   Diagnosis Date Noted   ICD (implantable cardioverter-defibrillator) in place  - mdt 01/30/2020   Benign neoplasm of colon    Arthritis 07/12/2016   Thrombocytosis 09/11/2014   Diabetes mellitus type 2 in obese (HCC) 07/31/2014   Special screening for malignant neoplasms, colon 07/24/2014   Leukocytosis 06/05/2014   Normocytic anemia 06/05/2014   Gout 05/06/2014   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   Implantable defibrillator-Medtronic atrial and LV port plugged 01/27/2012   Knee pain, right 06/17/2011   VT (ventricular tachycardia) (HCC) 06/07/2011   Chronic combined systolic and diastolic congestive heart failure (HCC) 05/02/2011   Quadriceps muscle rupture 02/02/2011  Knee pain, left 12/15/2010   Elevated uric acid in blood 08/14/2010   HYPERLIPIDEMIA 07/11/2009   Morbid obesity (HCC) 05/29/2008   NICM (nonischemic cardiomyopathy) (HCC) 05/29/2008   Essential hypertension 05/29/2008    Past Surgical History:  Procedure Laterality Date   BIOPSY  12/27/2017   Procedure: BIOPSY;  Surgeon: Benancio Deeds, MD;  Location: [facility];  Service:  Gastroenterology;;   COLONOSCOPY WITH PROPOFOL N/A 12/27/2017   Procedure: COLONOSCOPY WITH PROPOFOL;  Surgeon: Benancio Deeds, MD;  Location: [facility];  Service: Gastroenterology;  Laterality: N/A;   ICD  10/27/2011   ICD GENERATOR CHANGEOUT N/A 02/22/2020   Procedure: ICD GENERATOR CHANGEOUT;  Surgeon: Duke Salvia, MD;  Location: [facility];  Service: Cardiovascular;  Laterality: N/A;   IMPLANTABLE CARDIOVERTER DEFIBRILLATOR IMPLANT N/A 10/27/2011   Procedure: IMPLANTABLE CARDIOVERTER DEFIBRILLATOR IMPLANT;  Surgeon: Duke Salvia, MD;  Location: [facility] CATH LAB;  Service: Cardiovascular;  Laterality: N/A;   POLYPECTOMY  12/27/2017   Procedure: POLYPECTOMY;  Surgeon: Benancio Deeds, MD;  Location: [facility];  Service: Gastroenterology;;   RIGHT/LEFT HEART CATH AND CORONARY ANGIOGRAPHY N/A 03/01/2019   Procedure: RIGHT/LEFT HEART CATH AND CORONARY ANGIOGRAPHY;  Surgeon: Dolores Patty, MD;  [facility]: [facility];  Service: Cardiovascular;  Laterality: N/A;       Home Medications    Prior to Admission medications   Medication Sig Start Date End Date Taking? Authorizing Provider  allopurinol (ZYLOPRIM) 300 MG tablet Take 300 mg by mouth daily.  11/16/17  Yes [provider]  carvedilol (COREG) 25 MG tablet TAKE 1 TABLET(25 MG) BY MOUTH TWICE DAILY WITH A MEAL 10/12/21  Yes Bensimhon, Bevelyn Buckles, MD  colchicine 0.6 MG tablet Take 1 tablet by mouth 2 (two) times daily. 02/10/16  Yes [provider]  ENTRESTO 97-103 MG TAKE 1 TABLET BY MOUTH TWICE DAILY 09/15/21  Yes Bensimhon, Bevelyn Buckles, MD  lidocaine (XYLOCAINE) 5 % ointment Apply 1 Application topically as needed. 05/07/22  Yes Carlisle Beers, FNP  potassium chloride SA (KLOR-CON M) 20 MEQ tablet Take 1 tablet (20 mEq total) by mouth 2 (two) times daily. Please schedule appt for future refills. 1st attempt 12/23/21  Yes Bensimhon, Bevelyn Buckles, MD  spironolactone (ALDACTONE) 25 MG tablet TAKE 1  TABLET(25 MG) BY MOUTH DAILY 10/12/21  Yes Bensimhon, Bevelyn Buckles, MD  torsemide (DEMADEX) 20 MG tablet TAKE 3 TABLETS(60 MG) BY MOUTH DAILY 09/01/21  Yes Bensimhon, Bevelyn Buckles, MD  Vitamin D, Ergocalciferol, (DRISDOL) 1.25 MG (50000 UNIT) CAPS capsule Take 50,000 Units by mouth once a week. 01/29/20  Yes [provider]  acetaminophen (TYLENOL) 500 MG tablet Take 1,000 mg by mouth every 6 (six) hours as needed for mild pain or moderate pain. For pain    [provider]  clotrimazole-betamethasone (LOTRISONE) cream Apply 1 application topically daily as needed (Rash).    [provider]  naproxen (NAPROSYN) 500 MG tablet Take 500 mg by mouth 2 (two) times daily as needed (pain).    [provider]  tiZANidine (ZANAFLEX) 4 MG tablet Take 4 mg by mouth 2 (two) times daily as needed for muscle spasms (Back).  01/13/20   [provider]    Family History Family History  Problem Relation Age of Onset   Hypertension Mother     Social History Social History   Tobacco Use   Smoking status: Never   Smokeless tobacco: Never  Vaping Use   Vaping Use: Never used  Substance Use Topics   Alcohol use: Yes    Alcohol/week: 2.0 - 3.0 standard drinks of alcohol    Types: 2 - 3 Standard drinks or equivalent per week    Comment: occasionally (1-2 per week)   Drug use: No     Allergies   Hydralazine, Beta adrenergic blockers, and Isosorb dinitrate-hydralazine   Review of Systems Review of Systems Per HPI  Physical Exam Triage Vital Signs ED Triage Vitals  Enc Vitals Group     BP 05/07/22 1114 (!) 92/42     Pulse Rate 05/07/22 1114 (!) 105     Resp 05/07/22 1114 20     Temp 05/07/22 1114 98.4 F (36.9 C)     Temp Source 05/07/22 1114 Oral     SpO2 05/07/22 1114 100 %     Weight --      Height --      Head Circumference --      Peak Flow --      Pain Score 05/07/22 1111 10     Pain Loc --      Pain Edu? --      Excl. in GC? --    No data  found.  Updated Vital Signs BP 94/62 (BP Location: Right Arm)   Pulse 100   Temp 98.4 F (36.9 C) (Oral)   Resp 20   SpO2 100%   Visual Acuity Right Eye Distance:   Left Eye Distance:   Bilateral Distance:    Right Eye Near:   Left Eye Near:    Bilateral Near:     Physical Exam Vitals and nursing note reviewed.  Constitutional:      Appearance: Normal appearance. He is obese. He is not ill-appearing or toxic-appearing.  HENT:     Head: Normocephalic and atraumatic.     Right Ear: Hearing and external ear normal.     Left Ear: Hearing and external ear normal.     Nose: Nose normal.     Mouth/Throat:     Lips: Pink.     Mouth: Mucous membranes are moist.     Pharynx: No posterior oropharyngeal erythema.  Eyes:     General: Lids are normal. Vision grossly intact. Gaze aligned appropriately.     Extraocular Movements: Extraocular movements intact.     Conjunctiva/sclera: Conjunctivae normal.  Cardiovascular:     Rate and Rhythm: Normal rate and regular rhythm.     Heart sounds: Normal heart sounds, S1 normal and S2 normal.  Pulmonary:     Effort: Pulmonary effort is normal. No respiratory distress.     Breath sounds: Normal breath sounds and air entry.  Genitourinary:    Rectum: Tenderness and anal fissure present. No external hemorrhoid or internal hemorrhoid. Normal anal tone.       Comments: Non bleeding erythematous anal fissure present.  Musculoskeletal:     Cervical back: Neck supple.  Skin:    General: Skin is warm and dry.     Capillary Refill: Capillary refill takes less than 2 seconds.     Findings: No rash.  Neurological:     General: No focal deficit present.     Mental Status: He is alert and oriented to person, place, and time. Mental status is at baseline.     Cranial Nerves: No dysarthria or facial asymmetry.     Gait: Gait normal.  Psychiatric:        Mood and Affect: Mood normal.        Speech:  Speech normal.        Behavior: Behavior normal.         Thought Content: Thought content normal.        Judgment: Judgment normal.      UC Treatments / Results  Labs (all labs ordered are listed, but only abnormal results are displayed) Labs Reviewed - No data to display  EKG   Radiology No results found.  Procedures Procedures (including critical care time)  Medications Ordered in UC Medications - No data to display  Initial Impression / Assessment and Plan / UC Course  I have reviewed the triage vital signs and the nursing notes.  Pertinent labs & imaging results that were available during my care of the patient were reviewed by me and considered in my medical decision making (see chart for details).   1. Anal fissure Patient to apply lidocaine ointment to the anal area 2 times a day for the next 5-7 days to help with pain associated with anal fissure. Advised to increase fiber intake and exercise. May use colace stool softener over the counter daily to prevent hard bowel movements and constipation. PCP follow-up recommended if symptoms fail to improve in the next 3-4 days.   2. Hypotension, CHF Blood pressure soft in clinic and at home.  Patient does not have an appointment with his cardiologist Dr. Gala Romney until next year sometime.  Advised patient to schedule an appointment with Dr. Gala Romney for the next few weeks if possible so that he is able to evaluate his blood pressure medication regimen further due to recent hypotension.  Discussed case with Dr. Marlinda Mike who recommends decreasing the amount of carvedilol that patient is on by half.  He is to begin taking 12.5 mg of carvedilol in the morning and at bedtime instead of 25 mg twice daily.  He is to continue checking his blood pressure once daily and keep a diary of these numbers to bring to his next PCP/cardiologist appointment.  He is asymptomatic and neurologically at his baseline. No recent changes in weight. He is to continue taking all other medications as  prescribed. Strict ER return precautions discussed.   Discussed physical exam and available lab work findings in clinic with patient.  Counseled patient regarding appropriate use of medications and potential side effects for all medications recommended or prescribed today. Discussed red flag signs and symptoms of worsening condition,when to call the PCP office, return to urgent care, and when to seek higher level of care in the emergency department. Patient verbalizes understanding and agreement with plan. All questions answered. Patient discharged in stable condition.    Final Clinical Impressions(s) / UC Diagnoses   Final diagnoses:  Anal fissure  Rectal pain  Hypotension, unspecified hypotension type  Chronic combined systolic and diastolic congestive heart failure (HCC)     Discharge Instructions      Reduce the amount of carvedilol (Coreg) medication that you are taking by taking half tablet of carvedilol 2 times a day.  This means you will take 12.5 mg in the morning and 12.5 mg at bedtime.  Call to schedule an appointment with Dr. Gala Romney to schedule an appointment for soon as possible for reevaluation of your medications.  Place a small amount of lidocaine ointment to the rectum twice daily for the next 5 to 7 days to help with rectal irritation related to cut to the anal tissue.  Continue to drink plenty of water and increase the amount of fiber to your diet by adding  whole grains, vegetables, and fruits.   If you develop any new or worsening symptoms or do not improve in the next 2 to 3 days, please return.  If your symptoms are severe, please go to the emergency room.  Follow-up with your primary care provider for further evaluation and management of your symptoms as well as ongoing wellness visits.  I hope you feel better!     ED Prescriptions     Medication Sig Dispense Auth. Provider   hydrocortisone cream 1 %  (Status: Discontinued) Apply to affected area 2 times  daily 15 g Reita May M, FNP   lidocaine (XYLOCAINE) 5 % ointment Apply 1 Application topically as needed. 35.44 g Carlisle Beers, FNP      PDMP not reviewed this encounter.   Carlisle Beers, [place] 05/09/22 1016

## 2022-05-07 NOTE — ED Triage Notes (Signed)
Pt states he has rectal irritation and pain since Sunday he denies any bleeding. He took some tylenol this morning for the pain.

## 2022-05-11 ENCOUNTER — Telehealth (HOSPITAL_COMMUNITY): Payer: Self-pay | Admitting: *Deleted

## 2022-05-11 NOTE — Telephone Encounter (Signed)
Pt called to report his bp was low at a visit to urgent care they advised him to cut carvedilol in half. Pt said his bp is better 100's/60's and he is asymptomatic. Pt advised to continue to monitor to call our office if he becomes symptomatic or bp drops or starts to become elevated. Pt aware.

## 2022-07-12 ENCOUNTER — Telehealth (HOSPITAL_COMMUNITY): Payer: Self-pay | Admitting: Cardiology

## 2022-07-12 NOTE — Telephone Encounter (Signed)
Pt aware and reports he is in route now

## 2022-07-12 NOTE — Telephone Encounter (Signed)
Pt called to report he is covid + Around 3 am pt reports vomiting with large amounts of blood Denies active c/p No b/p readings to report Mild dizziness when standing  Increased weakness (felt it was related to covid)  Advised pt to contact PCP as concerns are not 100 % cardiac in nature -reports he left  a message this morning however have not heard anything and pt is concerned

## 2022-07-27 NOTE — Progress Notes (Unsigned)
Cardiology Office Note Date:  07/28/2022  Patient ID:  William Davila, William Davila April 17, 1966, MRN [id number] PCP:  Nolene Ebbs, MD  Cardiologist:  None HF Cardiologist: Glori Bickers, MD Electrophysiologist: Virl Axe, MD  Chief Complaint: 1 year ICD follow-up, past due  History of Present Illness: William Davila is a 57 y.o. male with PMH notable for NICM, HFrEF, s/p ICD, obesity; seen today for Virl Axe, MD for routine electrophysiology followup.  Last saw Dr. Caryl Comes 2021 Re-established care with HF 09/2021, doing well, having NSVT by device, 1 self-resolved VT Since last being seen in our clinic the patient reports doing well until he was diagnosed with COVID at the end of January and was admitted to [facility]. He has slowly been recovering, no longer has cough but energy level is not quite back. At discharge, his entresto was stopped d/t hypotension and he has not resumed it. He does not have a scale at home, so unsure of recent weights, but thinks his fluid status is good. Not SOB with activities, denies lower extremity edema.     he denies chest pain, palpitations, dyspnea, PND, orthopnea, nausea, vomiting, dizziness, syncope, or early satiety.    Device Information: MDT single-chamber ICD, dx HFrEF Gen change 02/2020  Past Medical History:  Diagnosis Date   AICD (automatic cardioverter/defibrillator) present    Arthritis    knee's   Cardiomyopathy ([place])    Chronic systolic heart failure ([place])    Diagnosed 2001; prior patient of Dr. Linard Millers with [name] - discharged from practice   Dilated cardiomyopathy ([place])    LVEF 10-20%   Essential hypertension, benign    Gout    Hyperlipidemia    Hypertension    ICD (implantable cardiac defibrillator) in place 10/27/2011   Morbid obesity ([place])    Presence of combination internal cardiac defibrillator (ICD) and pacemaker    Presence of permanent cardiac pacemaker    VT (ventricular tachycardia) ([place]) 06/07/2011    Past  Surgical History:  Procedure Laterality Date   BIOPSY  12/27/2017   Procedure: BIOPSY;  Surgeon: Yetta Flock, MD;  Location: [facility];  Service: Gastroenterology;;   COLONOSCOPY WITH PROPOFOL N/A 12/27/2017   Procedure: COLONOSCOPY WITH PROPOFOL;  Surgeon: Yetta Flock, MD;  Location: [facility];  Service: Gastroenterology;  Laterality: N/A;   ICD  10/27/2011   ICD GENERATOR CHANGEOUT N/A 02/22/2020   Procedure: ICD GENERATOR CHANGEOUT;  Surgeon: Deboraha Sprang, MD;  Location: [place] CV LAB;  Service: Cardiovascular;  Laterality: N/A;   IMPLANTABLE CARDIOVERTER DEFIBRILLATOR IMPLANT N/A 10/27/2011   Procedure: IMPLANTABLE CARDIOVERTER DEFIBRILLATOR IMPLANT;  Surgeon: Deboraha Sprang, MD;  Location: [facility] CATH LAB;  Service: Cardiovascular;  Laterality: N/A;   POLYPECTOMY  12/27/2017   Procedure: POLYPECTOMY;  Surgeon: Yetta Flock, MD;  Location: [facility];  Service: Gastroenterology;;   RIGHT/LEFT HEART CATH AND CORONARY ANGIOGRAPHY N/A 03/01/2019   Procedure: RIGHT/LEFT HEART CATH AND CORONARY ANGIOGRAPHY;  Surgeon: Jolaine Artist, MD;  Location: [place] CV LAB;  Service: Cardiovascular;  Laterality: N/A;    Current Outpatient Medications  Medication Instructions   acetaminophen (TYLENOL) 1,000 mg, Oral, Every 6 hours PRN, For pain    allopurinol (ZYLOPRIM) 300 mg, Oral, Daily   carvedilol (COREG) 25 MG tablet TAKE 1 TABLET(25 MG) BY MOUTH TWICE DAILY WITH A MEAL   clotrimazole-betamethasone (LOTRISONE) cream 1 application , Topical, Daily PRN   colchicine 0.6 MG tablet 1 tablet, Oral, 2 times daily   ENTRESTO  97-103 MG TAKE 1 TABLET BY MOUTH TWICE DAILY   lidocaine (XYLOCAINE) 5 % ointment 1 Application, Topical, As needed   naproxen (NAPROSYN) 500 mg, Oral, 2 times daily PRN   potassium chloride SA (KLOR-CON M) 20 MEQ tablet 20 mEq, Oral, 2 times daily, Please schedule appt for future refills. 1st attempt   spironolactone (ALDACTONE) 25 MG tablet  TAKE 1 TABLET(25 MG) BY MOUTH DAILY   tiZANidine (ZANAFLEX) 4 mg, Oral, 2 times daily PRN   torsemide (DEMADEX) 20 MG tablet TAKE 3 TABLETS(60 MG) BY MOUTH DAILY   Vitamin D (Ergocalciferol) (DRISDOL) 50,000 Units, Oral, Weekly      Social History:  The patient  reports that he has never smoked. He has never used smokeless tobacco. He reports current alcohol use of about 2.0 - 3.0 standard drinks of alcohol per week. He reports that he does not use drugs.   Family History:  he patient's family history includes Hypertension in his mother.  ROS:  Please see the history of present illness. All other systems are reviewed and otherwise negative.   PHYSICAL EXAM:  VS:  BP 108/66   Pulse 95   Ht 5' 10"$  (1.778 m)   Wt (!) 326 lb 12.8 oz (148.2 kg)   SpO2 99%   BMI 46.89 kg/m  BMI: Body mass index is 46.89 kg/m.  GEN- The patient is well appearing, alert and oriented x 3 today.   HEENT: normocephalic, atraumatic; sclera clear, conjunctiva pink; hearing intact; oropharynx clear; neck supple, no JVP Lungs- Clear to ausculation bilaterally, normal work of breathing.  No wheezes, rales, rhonchi Heart- Regular rate and rhythm, no murmurs, rubs or gallops, PMI not laterally displaced GI- soft, non-tender, non-distended, bowel sounds present, no hepatosplenomegaly Extremities- Trace peripheral edema. no clubbing or cyanosis; DP/PT/radial pulses 2+ bilaterally MS- no significant deformity or atrophy Skin- warm and dry, no rash or lesion, device pocket well-healed Psych- euthymic mood, full affect Neuro- strength and sensation are intact   Device interrogation done today and reviewed by myself:  Battery good Lead threshold, impedence, sensing stable  4 "VT" episodes recorded 1 appears to be SVT All are 1 second or less Optivol and thoracic impedence low No changes made today  EKG is ordered. Personal review of EKG from today shows:  NSR, rate 92bpm, 1st degree block, LBBB  Recent  Labs: 03/11/2022: ALT 7; BUN 27; Creat 1.96; Hemoglobin 12.1; Platelets 362; Potassium 5.0; Sodium 140; TSH 0.81  No results found for requested labs within last 365 days.   CrCl cannot be calculated (Patient's most recent lab result is older than the maximum 21 days allowed.).   Wt Readings from Last 3 Encounters:  07/28/22 (!) 326 lb 12.8 oz (148.2 kg)  09/29/21 (!) 388 lb 2 oz (176.1 kg)  07/18/20 (!) 397 lb 3.2 oz (180.2 kg)     Additional studies reviewed include: Previous EP, cardiology notes.   R/LHC, 03/01/2019 1. Normal coronary arteries 2. NICM EF 25-30% 3. Well compensated filling pressures with high cardiac output (likely overestimated with Fick)  TTE 02/26/2019  1. The left ventricle has severely reduced systolic function, with an ejection fraction of 25-30%. The cavity size was severely dilated. Left ventricular diastolic Doppler parameters are indeterminate.   2. Inferior septal and apical akinesis.   3. The right ventricle has normal systolic function. The cavity was normal. There is no increase in right ventricular wall thickness.   4. Left atrial size was mildly dilated.   5. Mild  thickening of the mitral valve leaflet. Mild calcification of the mitral valve leaflet.   6. The aortic valve is tricuspid. Moderate thickening of the aortic valve. Moderate calcification of the aortic valve. Mild stenosis of the aortic valve.   7. The aorta is normal unless otherwise noted.    ASSESSMENT AND PLAN:  #) VT s/p MDT ICD Device functioning well, see paceart for details No changes made Having brief periods of ectopy, 1 second or less - consider switching from coreg to metop at follow-up  #) HFrEF Fluid status well-controlled by exam and device GDMT: spiro, coreg Diuretic: 2m torsemide daily BP on lower side, so cont to hold entresto Due for HF follow-up soon, so can consider adding back entresto at that appt - will update labs today   Current medicines are reviewed  at length with the patient today.   The patient does not have concerns regarding his medicines.  The following changes were made today:  none  Labs/ tests ordered today include:  Orders Placed This Encounter  Procedures   Basic Metabolic Panel (BMET)   Magnesium   Pro b natriuretic peptide (BNP)   EKG 12-Lead     Disposition: Follow up with Dr. KCaryl Comesor EP APP in in 6 months   Signed, SMamie Levers NP  07/28/22  10:43 AM  Electrophysiology [facility]

## 2022-07-28 ENCOUNTER — Encounter: Payer: Self-pay | Admitting: Physician Assistant

## 2022-07-28 ENCOUNTER — Ambulatory Visit: Payer: 59 | Attending: Physician Assistant | Admitting: Cardiology

## 2022-07-28 VITALS — BP 108/66 | HR 95 | Ht 70.0 in | Wt 326.8 lb

## 2022-07-28 DIAGNOSIS — I10 Essential (primary) hypertension: Secondary | ICD-10-CM

## 2022-07-28 DIAGNOSIS — Z95810 Presence of automatic (implantable) cardiac defibrillator: Secondary | ICD-10-CM

## 2022-07-28 DIAGNOSIS — I5042 Chronic combined systolic (congestive) and diastolic (congestive) heart failure: Secondary | ICD-10-CM

## 2022-07-28 DIAGNOSIS — I472 Ventricular tachycardia, unspecified: Secondary | ICD-10-CM | POA: Diagnosis not present

## 2022-07-28 LAB — CUP PACEART INCLINIC DEVICE CHECK
Date Time Interrogation Session: 20240214133014
Implantable Lead Connection Status: 753985
Implantable Lead Implant Date: 20130515
Implantable Lead Location: 753860
Implantable Lead Model: 7121
Implantable Pulse Generator Implant Date: 20210910

## 2022-07-28 NOTE — Patient Instructions (Signed)
Medication Instructions:   Your physician recommends that you continue on your current medications as directed. Please refer to the Current Medication list given to you today.  *If you need a refill on your cardiac medications before your next appointment, please call your pharmacy*   Lab Work:  BMET MAG AND BNP TODAY   If you have labs (blood work) drawn today and your tests are completely normal, you will receive your results only by: [place] (if you have MyChart) OR A paper copy in the mail If you have any lab test that is abnormal or we need to change your treatment, we will call you to review the results.   Testing/Procedures: NONE ORDERED  TODAY   Follow-Up: At [facility], you and your health needs are our priority.  As part of our continuing mission to provide you with exceptional heart care, we have created designated Provider Care Teams.  These Care Teams include your primary Cardiologist (physician) and Advanced Practice Providers (APPs -  Physician Assistants and Nurse Practitioners) who all work together to provide you with the care you need, when you need it.  We recommend signing up for the patient portal called "MyChart".  Sign up information is provided on this After Visit Summary.  MyChart is used to connect with patients for Virtual Visits (Telemedicine).  Patients are able to view lab/test results, encounter notes, upcoming appointments, etc.  Non-urgent messages can be sent to your provider as well.   To learn more about what you can do with MyChart, go to [email or web address].    Your next appointment:  2-4  [place]  6 month(s)  Provider:   You may see Virl Axe, MD or one of the following Advanced Practice Providers on your designated Care Team:   [name], [place] "Jonni Sanger" [place], [place] Mamie Levers, NP   Other Instructions

## 2022-07-29 LAB — PRO B NATRIURETIC PEPTIDE: NT-Pro BNP: 2492 pg/mL — ABNORMAL HIGH (ref 0–121)

## 2022-07-29 LAB — BASIC METABOLIC PANEL
BUN/Creatinine Ratio: 24 — ABNORMAL HIGH (ref 9–20)
BUN: 36 mg/dL — ABNORMAL HIGH (ref 6–24)
CO2: 23 mmol/L (ref 20–29)
Calcium: 8.7 mg/dL (ref 8.7–10.2)
Chloride: 100 mmol/L (ref 96–106)
Creatinine, Ser: 1.48 mg/dL — ABNORMAL HIGH (ref 0.76–1.27)
Glucose: 97 mg/dL (ref 70–99)
Potassium: 3.7 mmol/L (ref 3.5–5.2)
Sodium: 140 mmol/L (ref 134–144)
eGFR: 56 mL/min/{1.73_m2} — ABNORMAL LOW (ref >59)

## 2022-07-29 LAB — MAGNESIUM: Magnesium: 1.7 mg/dL (ref 1.6–2.3)

## 2022-07-30 ENCOUNTER — Other Ambulatory Visit: Payer: Self-pay | Admitting: *Deleted

## 2022-07-30 MED ORDER — ENTRESTO 24-26 MG PO TABS: 1.0000 | ORAL_TABLET | Freq: Two times a day (BID) | ORAL | 2 refills | Status: DC

## 2022-08-17 ENCOUNTER — Ambulatory Visit (HOSPITAL_COMMUNITY)
Admission: RE | Admit: 2022-08-17 | Discharge: 2022-08-17 | Disposition: A | Discharge status: Home / Self Care | Payer: 59 | Source: Ambulatory Visit | Attending: Family Medicine | Admitting: Family Medicine

## 2022-08-17 ENCOUNTER — Encounter (HOSPITAL_COMMUNITY): Payer: Self-pay

## 2022-08-17 VITALS — BP 98/58 | HR 100 | Ht 70.0 in | Wt 343.6 lb

## 2022-08-17 DIAGNOSIS — I5023 Acute on chronic systolic (congestive) heart failure: Secondary | ICD-10-CM | POA: Diagnosis not present

## 2022-08-17 DIAGNOSIS — I110 Hypertensive heart disease with heart failure: Secondary | ICD-10-CM | POA: Diagnosis present

## 2022-08-17 DIAGNOSIS — E785 Hyperlipidemia, unspecified: Secondary | ICD-10-CM | POA: Insufficient documentation

## 2022-08-17 DIAGNOSIS — Z79899 Other long term (current) drug therapy: Secondary | ICD-10-CM | POA: Diagnosis not present

## 2022-08-17 DIAGNOSIS — R29818 Other symptoms and signs involving the nervous system: Secondary | ICD-10-CM

## 2022-08-17 DIAGNOSIS — R519 Headache, unspecified: Secondary | ICD-10-CM | POA: Insufficient documentation

## 2022-08-17 DIAGNOSIS — R011 Cardiac murmur, unspecified: Secondary | ICD-10-CM | POA: Diagnosis not present

## 2022-08-17 DIAGNOSIS — Z6841 Body Mass Index (BMI) 40.0 and over, adult: Secondary | ICD-10-CM | POA: Diagnosis not present

## 2022-08-17 DIAGNOSIS — Z95810 Presence of automatic (implantable) cardiac defibrillator: Secondary | ICD-10-CM

## 2022-08-17 LAB — COMPREHENSIVE METABOLIC PANEL
ALT: 14 U/L (ref 0–44)
AST: 14 U/L — ABNORMAL LOW (ref 15–41)
Albumin: 3.1 g/dL — ABNORMAL LOW (ref 3.5–5.0)
Alkaline Phosphatase: 89 U/L (ref 38–126)
Anion gap: 14 (ref 5–15)
BUN: 16 mg/dL (ref 6–20)
CO2: 24 mmol/L (ref 22–32)
Calcium: 8.7 mg/dL — ABNORMAL LOW (ref 8.9–10.3)
Chloride: 102 mmol/L (ref 98–111)
Creatinine, Ser: 1.37 mg/dL — ABNORMAL HIGH (ref 0.61–1.24)
GFR, Estimated: >60 mL/min (ref >60)
Glucose, Bld: 107 mg/dL — ABNORMAL HIGH (ref 70–99)
Potassium: 3.8 mmol/L (ref 3.5–5.1)
Sodium: 140 mmol/L (ref 135–145)
Total Bilirubin: 0.5 mg/dL (ref 0.3–1.2)
Total Protein: 6.3 g/dL — ABNORMAL LOW (ref 6.5–8.1)

## 2022-08-17 LAB — BRAIN NATRIURETIC PEPTIDE: B Natriuretic Peptide: 547.2 pg/mL — ABNORMAL HIGH (ref 0.0–100.0)

## 2022-08-17 MED ORDER — DAPAGLIFLOZIN PROPANEDIOL 10 MG PO TABS: 10.0000 mg | ORAL_TABLET | Freq: Every day | ORAL | 8 refills | Status: DC

## 2022-08-17 MED ORDER — POTASSIUM CHLORIDE CRYS ER 20 MEQ PO TBCR: 20.0000 meq | EXTENDED_RELEASE_TABLET | Freq: Every day | ORAL | 8 refills | Status: DC

## 2022-08-17 MED ORDER — METOLAZONE 2.5 MG PO TABS: 2.5000 mg | ORAL_TABLET | Freq: Once | ORAL | 0 refills | Status: DC

## 2022-08-17 NOTE — Patient Instructions (Signed)
Thank you for coming in today  Labs were done today, if any labs are abnormal the clinic will call you No news is good news  You were given a scale today  Medications: START Potassium 20 meq 1 tablet daily  START Farxiga 1 tablet 10 mg daily  Take Metolazone 2.5 mg 1 tablet with 40 meq of Potassium tomorrow 08/18/2022 with your Torsemide dose  Follow up appointments:  Your physician recommends that you schedule a follow-up appointment in: 2 weeks in clinic     Do the following things EVERYDAY: Weigh yourself in the morning before breakfast. Write it down and keep it in a log. Take your medicines as prescribed Eat low salt foods--Limit salt (sodium) to 2000 mg per day.  Stay as active as you can everyday Limit all fluids for the day to less than 2 liters   At the [facility], you and your health needs are our priority. As part of our continuing mission to provide you with exceptional heart care, we have created designated Provider Care Teams. These Care Teams include your primary Cardiologist (physician) and Advanced Practice Providers (APPs- Physician Assistants and Nurse Practitioners) who all work together to provide you with the care you need, when you need it.   You may see any of the following providers on your designated Care Team at your next follow up: Dr Glori Bickers Dr Loralie Champagne Dr. Roxana Hires, NP Lyda Jester, [place] [facility], [place] Forestine Na, NP Audry Riles, PharmD   Please be sure to bring in all your medications bottles to every appointment.    Thank you for choosing [facility]  If you have any questions or concerns before your next appointment please send [place] a message through [place] or call our office at [phone].    TO LEAVE A MESSAGE FOR THE NURSE SELECT OPTION 2, PLEASE LEAVE A MESSAGE INCLUDING: YOUR NAME DATE OF BIRTH CALL BACK  NUMBER REASON FOR CALL**this is important as we prioritize the call backs  YOU WILL RECEIVE A CALL BACK THE SAME DAY AS LONG AS YOU CALL BEFORE 4:00 PM

## 2022-08-17 NOTE — Addendum Note (Signed)
Encounter addended by: Payton Mccallum, RN on: 08/17/2022 3:58 PM  Actions taken: Clinical Note Signed, Charge Capture section accepted, Flowsheet accepted

## 2022-08-17 NOTE — Progress Notes (Signed)
Advanced Heart Failure Clinic Note   Date:  08/17/2022   ID:  William Davila, DOB 1966-01-24, MRN [id number]  Location: Home  Provider location: [place] [facility] Type of Visit: Established patient  PCP:  Nolene Ebbs, MD  EP:  Dr. Caryl Comes HF cardiologist: [place]  Chief Complaint: Heart Failure follow-up   HPI: William Davila is a 57 y.o.male with systolic heart failure , cardiomyopathy diagnosed 2001, hyperlipidemia, HTN, and obesity. S/P St Jude single chamber ICD 10/2011. Intolerant Bidil due to headaches.    Discharged from [facility] 05/10/2011 Discharge weight 328.  Massive volume over load and low output. Cardiac output increased inotropes. Medications initiated ace, beta blocker, digoxin, and torsemide.    Follow up 4/23, had not been seen since 2/22. NYHA II and volume OK.  Admitted 1/24 to [place] [facility] with COVID.  Today he returns for yearly HF follow up. Overall feeling fine. He has dyspnea walking up steps. Not urinating as much on torsemide 60. Denies palpitations, CP, dizziness, edema, or PND/Orthopnea. Appetite ok. No fever or chills. He does not weigh at home. Taking all medications. No tobacco use, drinks ETOH on the weekend. Does not know if he snores.  Device interrogation (personally reviewed): OptiVol up since 08/05/22, thoracic impedence down, 0.8 hr/day activity, no VT/AF   Cardiac Studies  Echo 9/20 EF 25-30% Echo 07/12/16 EF 25-30% Echo 06/20/14 EF 25-30% RV normal  Echo 08/2011 EF 20-25%  Past Medical History:  Diagnosis Date   AICD (automatic cardioverter/defibrillator) present    Arthritis    knee's   Cardiomyopathy ([place])    Chronic systolic heart failure ([place])    Diagnosed 2001; prior patient of Dr. Linard Millers with [name] - discharged from practice   Dilated cardiomyopathy ([place])    LVEF 10-20%   Essential hypertension, benign    Gout    Hyperlipidemia    Hypertension    ICD (implantable cardiac defibrillator) in place 10/27/2011    Morbid obesity ([place])    Presence of combination internal cardiac defibrillator (ICD) and pacemaker    Presence of permanent cardiac pacemaker    VT (ventricular tachycardia) ([place]) 06/07/2011   Past Surgical History:  Procedure Laterality Date   BIOPSY  12/27/2017   Procedure: BIOPSY;  Surgeon: Yetta Flock, MD;  [facility]: [facility];  Service: Gastroenterology;;   COLONOSCOPY WITH PROPOFOL N/A 12/27/2017   Procedure: COLONOSCOPY WITH PROPOFOL;  Surgeon: Yetta Flock, MD;  [facility]: [facility];  Service: Gastroenterology;  Laterality: N/A;   ICD  10/27/2011   ICD GENERATOR CHANGEOUT N/A 02/22/2020   Procedure: ICD GENERATOR CHANGEOUT;  Surgeon: Deboraha Sprang, MD;  Location: [place] CV LAB;  Service: Cardiovascular;  Laterality: N/A;   IMPLANTABLE CARDIOVERTER DEFIBRILLATOR IMPLANT N/A 10/27/2011   Procedure: IMPLANTABLE CARDIOVERTER DEFIBRILLATOR IMPLANT;  Surgeon: Deboraha Sprang, MD;  [facility];  Service: Cardiovascular;  Laterality: N/A;   POLYPECTOMY  12/27/2017   Procedure: POLYPECTOMY;  Surgeon: Yetta Flock, MD;  [facility];  Service: Gastroenterology;;   RIGHT/LEFT HEART CATH AND CORONARY ANGIOGRAPHY N/A 03/01/2019   Procedure: RIGHT/LEFT HEART CATH AND CORONARY ANGIOGRAPHY;  Surgeon: Jolaine Artist, MD;  Location: [place] CV LAB;  Service: Cardiovascular;  Laterality: N/A;   Current Outpatient Medications  Medication Sig Dispense Refill   acetaminophen (TYLENOL) 500 MG tablet Take 1,000 mg by mouth every 6 (six) hours as needed for mild pain or moderate pain. For pain     allopurinol (  ZYLOPRIM) 300 MG tablet Take 300 mg by mouth daily.   1   carvedilol (COREG) 25 MG tablet TAKE 1 TABLET(25 MG) BY MOUTH TWICE DAILY WITH A MEAL 180 tablet 3   colchicine 0.6 MG tablet Take 1 tablet by mouth 2 (two) times daily.     naproxen (NAPROSYN) 500 MG tablet Take 500 mg by mouth 2 (two) times daily as needed (pain).      sacubitril-valsartan (ENTRESTO) 24-26 MG Take 1 tablet by mouth 2 (two) times daily. 180 tablet 2   spironolactone (ALDACTONE) 25 MG tablet TAKE 1 TABLET(25 MG) BY MOUTH DAILY 90 tablet 3   torsemide (DEMADEX) 20 MG tablet TAKE 3 TABLETS(60 MG) BY MOUTH DAILY 270 tablet 3   clotrimazole-betamethasone (LOTRISONE) cream Apply 1 application topically daily as needed (Rash). (Patient not taking: Reported on 08/17/2022)     lidocaine (XYLOCAINE) 5 % ointment Apply 1 Application topically as needed. (Patient not taking: Reported on 08/17/2022) 35.44 g 0   potassium chloride SA (KLOR-CON M) 20 MEQ tablet Take 1 tablet (20 mEq total) by mouth 2 (two) times daily. Please schedule appt for future refills. 1st attempt (Patient not taking: Reported on 08/17/2022) 180 tablet 3   tiZANidine (ZANAFLEX) 4 MG tablet Take 4 mg by mouth 2 (two) times daily as needed for muscle spasms (Back).  (Patient not taking: Reported on 08/17/2022)     Vitamin D, Ergocalciferol, (DRISDOL) 1.25 MG (50000 UNIT) CAPS capsule Take 50,000 Units by mouth once a week. (Patient not taking: Reported on 08/17/2022)     No current facility-administered medications for this encounter.    Allergies:   Hydralazine, Beta adrenergic blockers, and Isosorb dinitrate-hydralazine   Social History:  The patient  reports that he has never smoked. He has never used smokeless tobacco. He reports current alcohol use of about 2.0 - 3.0 standard drinks of alcohol per week. He reports that he does not use drugs.   Family History:  The patient's family history includes Hypertension in his mother.   ROS:  Please see the history of present illness.   All other systems are personally reviewed and negative.   BP (!) 98/58   Pulse 100   Ht '5\' 10"'$  (1.778 m)   Wt (!) 155.9 kg (343 lb 9.6 oz)   SpO2 98%   BMI 49.30 kg/m   Wt Readings from Last 3 Encounters:  08/17/22 (!) 155.9 kg (343 lb 9.6 oz)  07/28/22 (!) 148.2 kg (326 lb 12.8 oz)  09/29/21 (!) 176.1 kg (388  lb 2 oz)   Exam:  General:  NAD. No resp difficulty, walked into clinic HEENT: Normal Neck: Supple. JVP to jaw. Carotids 2+ bilat; no bruits. No lymphadenopathy or thryomegaly appreciated. Cor: PMI nondisplaced. Regular rate & rhythm. No rubs, gallops, 2/6 SEM LUSB Lungs: Clear Abdomen: Soft, obese, nontender, nondistended. No hepatosplenomegaly. No bruits or masses. Good bowel sounds. Extremities: No cyanosis, clubbing, rash, edema Neuro: Alert & oriented x 3, cranial nerves grossly intact. Moves all 4 extremities w/o difficulty. Affect pleasant.  Recent Labs: 03/11/2022: ALT 7; Hemoglobin 12.1; Platelets 362; TSH 0.81 07/28/2022: BUN 36; Creatinine, Ser 1.48; Magnesium 1.7; NT-Pro BNP 2,492; Potassium 3.7; Sodium 140  Personally reviewed   ASSESSMENT AND PLAN:  1. Acute on chronic Systolic Heart Failure - Medtronic Single lead ICD 10/2011   - Echo 06/2016 LVEF 20-25%. Presumed NICM.  - Cath 9/20 no CAD  - Echo 9/21 EF 25-30%  - Stable NYHA II-early III, functional status limited  by body habitus. Volume Up on OptiVol - Start Farxiga 10 mg daily. Discussed potential SE. - Give metolazone 2.5 mg + 40 KCL with tomorrow's dose of torsemide. - Continue torsemide 60 mg daily. Add 20 KCL daily. - Continue carvedilol 25 mg bid. - no longer on digoxin 4/23. - Continue Entresto 24/26 mg bid. - Continue spironolactone 25 mg daily.  - Intolerant to Bidil due to severe headaches. - We referred him to Dr. Caryl Comes. Had gen change in 9/21 but did not think he qualified for upgrade to CRT with IVCD - Labs today. - Will ask Sharman Cheek to send device interrogation in 1 week - Gifted a scale today and asked to weigh daily.   2. Morbid Obesity - Body mass index is 49.3 kg/m. - previously on Ozempic but insurance would not cover. - Down 40 lbs since last visit.   3. Probable OSA - Not interested in sleep study  4. Murmur - Echo 9/20 with mild AI and mild TR. - Repeat echo after  diuresis  Follow up in 2 weeks with APP for fluid check (will need bmet) and 6 months with Dr. Haroldine Laws  Signed, Rafael Bihari, [place]  08/17/2022 3:25 PM  [place] [address] Heart and [place] [address] [phone] (office) [phone] (fax)

## 2022-08-18 LAB — HEMOGLOBIN A1C
Hgb A1c MFr Bld: 6.1 % — ABNORMAL HIGH (ref 4.8–5.6)
Mean Plasma Glucose: 128 mg/dL

## 2022-08-27 ENCOUNTER — Other Ambulatory Visit (HOSPITAL_COMMUNITY): Payer: Self-pay | Admitting: Internal Medicine

## 2022-08-27 ENCOUNTER — Ambulatory Visit: Payer: 59

## 2022-08-27 ENCOUNTER — Other Ambulatory Visit (HOSPITAL_COMMUNITY): Payer: Self-pay

## 2022-08-27 DIAGNOSIS — I428 Other cardiomyopathies: Secondary | ICD-10-CM | POA: Diagnosis not present

## 2022-08-27 LAB — CUP PACEART REMOTE DEVICE CHECK
Battery Remaining Longevity: 119 mo
Battery Voltage: 3.03 V
Brady Statistic RV Percent Paced: 0 %
Date Time Interrogation Session: 20240315001609
HighPow Impedance: 100 Ohm
HighPow Impedance: 78 Ohm
Implantable Lead Connection Status: 753985
Implantable Lead Implant Date: 20130515
Implantable Lead Location: 753860
Implantable Lead Model: 7121
Implantable Pulse Generator Implant Date: 20210910
Lead Channel Impedance Value: 1634 Ohm
Lead Channel Impedance Value: 304 Ohm
Lead Channel Pacing Threshold Amplitude: 0.625 V
Lead Channel Pacing Threshold Pulse Width: 0.4 ms
Lead Channel Sensing Intrinsic Amplitude: 9.375 mV
Lead Channel Sensing Intrinsic Amplitude: 9.375 mV
Lead Channel Setting Pacing Amplitude: 2 V
Lead Channel Setting Pacing Pulse Width: 0.4 ms
Lead Channel Setting Sensing Sensitivity: 0.3 mV
Zone Setting Status: 755011
Zone Setting Status: 755011

## 2022-08-27 MED ORDER — TORSEMIDE 20 MG PO TABS: ORAL_TABLET | ORAL | 3 refills | Status: DC

## 2022-08-31 NOTE — Progress Notes (Signed)
Advanced Heart Failure Clinic Note   Date:  09/01/2022   ID:  [name], DOB 01/10/1966, MRN [id number]  Location: Home  Provider location: [place] [facility] Type of Visit: Established patient  PCP:  Nolene Ebbs, MD  EP:  Dr. Caryl Comes HF Cardiologist: [place]  Chief Complaint: Heart Failure follow-up   HPI: William Davila is a 57 y.o. male with systolic heart failure, cardiomyopathy diagnosed 2001, HLD, HTN, and obesity. S/P St Jude single chamber ICD 10/2011. Intolerant Bidil due to headaches.    Discharged from [facility] 05/10/2011 Discharge weight 328.  Massive volume over load and low output. Cardiac output increased inotropes. Medications initiated ace, beta blocker, digoxin, and torsemide.    Follow up 4/23, had not been seen since 2/22. NYHA II and volume OK.  Admitted 1/24 to [place] with COVID.  Yearly follow up 08/17/22, NYHA II-III and volume up. Wilder Glade started and instructed to take a dose of metolazone 2.5/40 KCL x 1.   Today he returns for HF follow up. Overall feeling fine. He had brisk urination with metolazone use. No SOB walking on flat ground, limited more by knee OA.  Denies palpitations, abnormal bleeding, CP, dizziness, edema, or PND/Orthopnea. Appetite ok. No fever or chills. Weight at home 333 pounds. Taking all medications. No tobacco use, drinks occasional ETOH. Does not know if he snores.  Cardiac Studies  - Echo (9/20): EF 25-30%  - Echo (1/18): EF 25-30%  - Echo (1/16): EF 25-30% RV normal   - Echo (3/13): EF 20-25%  Past Medical History:  Diagnosis Date   AICD (automatic cardioverter/defibrillator) present    Arthritis    knee's   Cardiomyopathy ([place])    Chronic systolic heart failure ([place])    Diagnosed 2001; prior patient of Dr. Linard Millers with [facility]ila - discharged from practice   Dilated cardiomyopathy ([place])    LVEF 10-20%   Essential hypertension, benign    Gout    Hyperlipidemia    Hypertension    ICD  (implantable cardiac defibrillator) in place 10/27/2011   Morbid obesity ([place])    Presence of combination internal cardiac defibrillator (ICD) and pacemaker    Presence of permanent cardiac pacemaker    VT (ventricular tachycardia) ([place]) 06/07/2011   Past Surgical History:  Procedure Laterality Date   BIOPSY  12/27/2017   Procedure: BIOPSY;  Surgeon: William Flock, MD;  [facility];  Service: Gastroenterology;;   COLONOSCOPY WITH PROPOFOL N/A 12/27/2017   Procedure: COLONOSCOPY WITH PROPOFOL;  Surgeon: William Flock, MD;  [facility];  Service: Gastroenterology;  Laterality: N/A;   ICD  10/27/2011   ICD GENERATOR CHANGEOUT N/A 02/22/2020   Procedure: ICD GENERATOR CHANGEOUT;  Surgeon: Deboraha Sprang, MD;  Location: [place] CV LAB;  Service: Cardiovascular;  Laterality: N/A;   IMPLANTABLE CARDIOVERTER DEFIBRILLATOR IMPLANT N/A 10/27/2011   Procedure: IMPLANTABLE CARDIOVERTER DEFIBRILLATOR IMPLANT;  Surgeon: Deboraha Sprang, MD;  Location: [facility] CATH LAB;  Service: Cardiovascular;  Laterality: N/A;   POLYPECTOMY  12/27/2017   Procedure: POLYPECTOMY;  Surgeon: William Flock, MD;  [facility];  Service: Gastroenterology;;   RIGHT/LEFT HEART CATH AND CORONARY ANGIOGRAPHY N/A 03/01/2019   Procedure: RIGHT/LEFT HEART CATH AND CORONARY ANGIOGRAPHY;  Surgeon: William Artist, MD;  Location: [place] CV LAB;  Service: Cardiovascular;  Laterality: N/A;   Current Outpatient Medications  Medication Sig Dispense Refill   acetaminophen (TYLENOL) 500 MG tablet Take 1,000 mg by mouth every  6 (six) hours as needed for mild pain or moderate pain. For pain     allopurinol (ZYLOPRIM) 300 MG tablet Take 300 mg by mouth daily.   1   carvedilol (COREG) 25 MG tablet TAKE 1 TABLET(25 MG) BY MOUTH TWICE DAILY WITH A MEAL 180 tablet 3   clotrimazole-betamethasone (LOTRISONE) cream Apply 1 application  topically daily as needed (Rash).     colchicine 0.6 MG tablet  Take 1 tablet by mouth 2 (two) times daily.     dapagliflozin propanediol (FARXIGA) 10 MG TABS tablet Take 1 tablet (10 mg total) by mouth daily before breakfast. 30 tablet 8   lidocaine (XYLOCAINE) 5 % ointment Apply 1 Application topically as needed. 35.44 g 0   naproxen (NAPROSYN) 500 MG tablet Take 500 mg by mouth 2 (two) times daily as needed (pain).     potassium chloride SA (KLOR-CON M) 20 MEQ tablet Take 1 tablet (20 mEq total) by mouth daily. 30 tablet 8   sacubitril-valsartan (ENTRESTO) 24-26 MG Take 1 tablet by mouth 2 (two) times daily. 180 tablet 2   spironolactone (ALDACTONE) 25 MG tablet TAKE 1 TABLET(25 MG) BY MOUTH DAILY 90 tablet 3   tiZANidine (ZANAFLEX) 4 MG tablet Take 4 mg by mouth 2 (two) times daily as needed for muscle spasms (Back).     torsemide (DEMADEX) 20 MG tablet TAKE 3 TABLETS(60 MG) BY MOUTH DAILY 270 tablet 3   No current facility-administered medications for this encounter.    Allergies:   Hydralazine, Beta adrenergic blockers, and Isosorb dinitrate-hydralazine   Social History:  The patient  reports that he has never smoked. He has never used smokeless tobacco. He reports current alcohol use of about 2.0 - 3.0 standard drinks of alcohol per week. He reports that he does not use drugs.   Family History:  The patient's family history includes Hypertension in his mother.   ROS:  Please see the history of present illness.   All other systems are personally reviewed and negative.   Recent Labs: 03/11/2022: Hemoglobin 12.1; Platelets 362; TSH 0.81 07/28/2022: Magnesium 1.7; NT-Pro BNP 2,492 08/17/2022: ALT 14; B Natriuretic Peptide 547.2; BUN 16; Creatinine, Ser 1.37; Potassium 3.8; Sodium 140  Personally reviewed   BP (!) 90/58   Pulse 97   Wt (!) 152.6 kg (336 lb 6.4 oz)   SpO2 97%   BMI 48.27 kg/m   Wt Readings from Last 3 Encounters:  09/01/22 (!) 152.6 kg (336 lb 6.4 oz)  08/17/22 (!) 155.9 kg (343 lb 9.6 oz)  07/28/22 (!) 148.2 kg (326 lb 12.8 oz)    Physical Exam:  General:  NAD. No resp difficulty, walked into clinic HEENT: Normal Neck: Supple. JVP 7-8, thick neck. Carotids 2+ bilat; no bruits. No lymphadenopathy or thryomegaly appreciated. Cor: PMI nondisplaced. Regular rate & rhythm. No rubs, gallops, 2/6 SEM LUSB Lungs: Clear Abdomen: Soft, nontender, nondistended. No hepatosplenomegaly. No bruits or masses. Good bowel sounds. Extremities: No cyanosis, clubbing, rash, edema Neuro: Alert & oriented x 3, cranial nerves grossly intact. Moves all 4 extremities w/o difficulty. Affect pleasant.  Device interrogation (personally reviewed): OptiVol up since 08/05/22, but thoracic impedence approaching reference line, 1 hr/day activity, no VT/AF  ReDs: 32%  ASSESSMENT AND PLAN: 1. Chronic Systolic Heart Failure - Medtronic Single lead ICD 10/2011   - Echo 06/2016 LVEF 20-25%. Presumed NICM.  - Cath 9/20 no CAD  - Echo 9/21 EF 25-30%  - Stable NYHA II, volume better, weight down 7 lbs. OptiVol  still up but impedence improved, ReDs 32%  - Will give Rx for metolazone 2.5 mg w/ extra 40 KCL for home weight > 338 lbs. Do not take more than once a week. - Continue Farxiga 10 mg daily.  - Continue torsemide 60 mg daily + 20 KCL daily. - Continue carvedilol 25 mg bid. - Continue Entresto 24/26 mg bid. - Continue spironolactone 25 mg daily.  - Off digoxin with elevated level. - Intolerant to Bidil due to severe headaches. - We referred him to Dr. Caryl Comes. Had gen change in 9/21 but did not think he qualified for upgrade to CRT with IVCD. - BMET and BNP today. - Update echo.   2. Morbid Obesity - Body mass index is 48.27 kg/m. - previously on Ozempic but insurance would not cover. - Down 40 lbs    3. Probable OSA - Not interested in sleep study  4. Murmur - Echo 9/20 with mild AI and mild TR. - Repeat echo   Follow up in 6 months with Dr. Haroldine Laws, repeat echo soon.  Frankey Poot, FNP  09/01/2022 3:42  PM  Advanced Heart Failure [place] [address] Heart and [place] [address] [phone] (office) [phone] (fax)

## 2022-09-01 ENCOUNTER — Ambulatory Visit (HOSPITAL_COMMUNITY)
Admission: RE | Admit: 2022-09-01 | Discharge: 2022-09-01 | Disposition: A | Discharge status: Home / Self Care | Payer: 59 | Source: Ambulatory Visit | Attending: Family Medicine | Admitting: Family Medicine

## 2022-09-01 ENCOUNTER — Encounter (HOSPITAL_COMMUNITY): Payer: Self-pay

## 2022-09-01 VITALS — BP 90/58 | HR 97 | Wt 336.4 lb

## 2022-09-01 DIAGNOSIS — Z6841 Body Mass Index (BMI) 40.0 and over, adult: Secondary | ICD-10-CM | POA: Insufficient documentation

## 2022-09-01 DIAGNOSIS — Z8616 Personal history of COVID-19: Secondary | ICD-10-CM | POA: Insufficient documentation

## 2022-09-01 DIAGNOSIS — E785 Hyperlipidemia, unspecified: Secondary | ICD-10-CM | POA: Insufficient documentation

## 2022-09-01 DIAGNOSIS — Z7984 Long term (current) use of oral hypoglycemic drugs: Secondary | ICD-10-CM | POA: Diagnosis not present

## 2022-09-01 DIAGNOSIS — R011 Cardiac murmur, unspecified: Secondary | ICD-10-CM | POA: Diagnosis not present

## 2022-09-01 DIAGNOSIS — I110 Hypertensive heart disease with heart failure: Secondary | ICD-10-CM | POA: Diagnosis not present

## 2022-09-01 DIAGNOSIS — R29818 Other symptoms and signs involving the nervous system: Secondary | ICD-10-CM | POA: Diagnosis not present

## 2022-09-01 DIAGNOSIS — I420 Dilated cardiomyopathy: Secondary | ICD-10-CM | POA: Insufficient documentation

## 2022-09-01 DIAGNOSIS — Z79899 Other long term (current) drug therapy: Secondary | ICD-10-CM | POA: Insufficient documentation

## 2022-09-01 DIAGNOSIS — I5022 Chronic systolic (congestive) heart failure: Secondary | ICD-10-CM

## 2022-09-01 LAB — BASIC METABOLIC PANEL
Anion gap: 8 (ref 5–15)
BUN: 22 mg/dL — ABNORMAL HIGH (ref 6–20)
CO2: 25 mmol/L (ref 22–32)
Calcium: 8.9 mg/dL (ref 8.9–10.3)
Chloride: 103 mmol/L (ref 98–111)
Creatinine, Ser: 1.55 mg/dL — ABNORMAL HIGH (ref 0.61–1.24)
GFR, Estimated: 53 mL/min — ABNORMAL LOW (ref >60)
Glucose, Bld: 89 mg/dL (ref 70–99)
Potassium: 4.1 mmol/L (ref 3.5–5.1)
Sodium: 136 mmol/L (ref 135–145)

## 2022-09-01 LAB — BRAIN NATRIURETIC PEPTIDE: B Natriuretic Peptide: 211.4 pg/mL — ABNORMAL HIGH (ref 0.0–100.0)

## 2022-09-01 MED ORDER — METOLAZONE 2.5 MG PO TABS: 2.5000 mg | ORAL_TABLET | ORAL | 0 refills | Status: DC

## 2022-09-01 NOTE — Progress Notes (Signed)
ReDS Vest / Clip - 09/01/22 1558       ReDS Vest / Clip   Station Marker D    Ruler Value 40    ReDS Value Range Low volume    ReDS Actual Value 32    Anatomical Comments SITTING

## 2022-09-01 NOTE — Patient Instructions (Addendum)
Thank you for coming in today  Labs were done today, if any labs are abnormal the clinic will call you No news is good news  Medications: TAKE Metolazone 2.5 mg with extra 40 meq of Potassium when home weight is over 338 lbs Do not take more than once a week  Follow up appointments:  Your physician recommends that you schedule a follow-up appointment in:  6 months With Dr. Haroldine Laws    You will receive a reminder letter in the mail a few months in advance. If you don't receive a letter, please call our office to schedule the follow-up appointment.   Your physician has requested that you have an echocardiogram. Echocardiography is a painless test that uses sound waves to create images of your heart. It provides your doctor with information about the size and shape of your heart and how well your heart's chambers and valves are working. This procedure takes approximately one hour. There are no restrictions for this procedure.     Do the following things EVERYDAY: Weigh yourself in the morning before breakfast. Write it down and keep it in a log. Take your medicines as prescribed Eat low salt foods--Limit salt (sodium) to 2000 mg per day.  Stay as active as you can everyday Limit all fluids for the day to less than 2 liters   At the [facility], you and your health needs are our priority. As part of our continuing mission to provide you with exceptional heart care, we have created designated Provider Care Teams. These Care Teams include your primary Cardiologist (physician) and Advanced Practice Providers (APPs- Physician Assistants and Nurse Practitioners) who all work together to provide you with the care you need, when you need it.   You may see any of the following providers on your designated Care Team at your next follow up: Dr Glori Bickers Dr Loralie Champagne Dr. Roxana Hires, NP Lyda Jester, [place] [facility] [place], [place] Forestine Na, NP Audry Riles, PharmD   Please be sure to bring in all your medications bottles to every appointment.    Thank you for choosing [facility]  If you have any questions or concerns before your next appointment please send [place] a message through [place] or call our office at [phone].    TO LEAVE A MESSAGE FOR THE NURSE SELECT OPTION 2, PLEASE LEAVE A MESSAGE INCLUDING: YOUR NAME DATE OF BIRTH CALL BACK NUMBER REASON FOR CALL**this is important as we prioritize the call backs  YOU WILL RECEIVE A CALL BACK THE SAME DAY AS LONG AS YOU CALL BEFORE 4:00 PM

## 2022-09-09 ENCOUNTER — Encounter: Payer: Self-pay | Admitting: Internal Medicine

## 2022-09-13 ENCOUNTER — Encounter: Payer: Self-pay | Admitting: Internal Medicine

## 2022-09-28 NOTE — Progress Notes (Signed)
Remote ICD transmission.   

## 2022-10-02 ENCOUNTER — Other Ambulatory Visit (HOSPITAL_COMMUNITY): Payer: Self-pay | Admitting: Internal Medicine

## 2022-10-04 ENCOUNTER — Other Ambulatory Visit (HOSPITAL_COMMUNITY): Payer: Self-pay

## 2022-10-04 DIAGNOSIS — I5022 Chronic systolic (congestive) heart failure: Secondary | ICD-10-CM

## 2022-10-04 MED ORDER — CARVEDILOL 25 MG PO TABS: ORAL_TABLET | ORAL | 3 refills | Status: DC

## 2022-10-12 ENCOUNTER — Other Ambulatory Visit (HOSPITAL_COMMUNITY): Payer: Self-pay | Admitting: Internal Medicine

## 2022-10-12 DIAGNOSIS — I10 Essential (primary) hypertension: Secondary | ICD-10-CM

## 2022-10-12 DIAGNOSIS — I472 Ventricular tachycardia, unspecified: Secondary | ICD-10-CM

## 2022-11-26 ENCOUNTER — Ambulatory Visit (INDEPENDENT_AMBULATORY_CARE_PROVIDER_SITE_OTHER): Payer: 59

## 2022-11-26 DIAGNOSIS — I428 Other cardiomyopathies: Secondary | ICD-10-CM | POA: Diagnosis not present

## 2022-11-26 LAB — CUP PACEART REMOTE DEVICE CHECK
Battery Remaining Longevity: 117 mo
Battery Voltage: 3.03 V
Brady Statistic RV Percent Paced: 0 %
Date Time Interrogation Session: 20240614001702
HighPow Impedance: 106 Ohm
HighPow Impedance: 81 Ohm
Implantable Lead Connection Status: 753985
Implantable Lead Implant Date: 20130515
Implantable Lead Location: 753860
Implantable Lead Model: 7121
Implantable Pulse Generator Implant Date: 20210910
Lead Channel Impedance Value: 1615 Ohm
Lead Channel Impedance Value: 323 Ohm
Lead Channel Pacing Threshold Amplitude: 0.75 V
Lead Channel Pacing Threshold Pulse Width: 0.4 ms
Lead Channel Sensing Intrinsic Amplitude: 10.125 mV
Lead Channel Sensing Intrinsic Amplitude: 10.125 mV
Lead Channel Setting Pacing Amplitude: 2 V
Lead Channel Setting Pacing Pulse Width: 0.4 ms
Lead Channel Setting Sensing Sensitivity: 0.3 mV
Zone Setting Status: 755011
Zone Setting Status: 755011

## 2022-12-08 ENCOUNTER — Other Ambulatory Visit (HOSPITAL_COMMUNITY): Payer: Self-pay | Admitting: Family Medicine

## 2022-12-14 NOTE — Progress Notes (Signed)
Remote ICD transmission.   

## 2023-01-14 ENCOUNTER — Ambulatory Visit: Payer: 59 | Attending: Internal Medicine | Admitting: Internal Medicine

## 2023-01-14 ENCOUNTER — Encounter: Payer: Self-pay | Admitting: Internal Medicine

## 2023-01-14 VITALS — BP 82/50 | HR 97 | Ht 70.0 in | Wt 346.0 lb

## 2023-01-14 DIAGNOSIS — I428 Other cardiomyopathies: Secondary | ICD-10-CM | POA: Diagnosis not present

## 2023-01-14 DIAGNOSIS — Z95810 Presence of automatic (implantable) cardiac defibrillator: Secondary | ICD-10-CM | POA: Diagnosis not present

## 2023-01-14 DIAGNOSIS — I5022 Chronic systolic (congestive) heart failure: Secondary | ICD-10-CM

## 2023-01-14 NOTE — Patient Instructions (Signed)
Medication Instructions:  Your physician recommends that you continue on your current medications as directed. Please refer to the Current Medication list given to you today.  *If you need a refill on your cardiac medications before your next appointment, please call your pharmacy*   Lab Work: None ordered.  If you have labs (blood work) drawn today and your tests are completely normal, you will receive your results only by: MyChart Message (if you have MyChart) OR A paper copy in the mail If you have any lab test that is abnormal or we need to change your treatment, we will call you to review the results.   Testing/Procedures: None ordered.    Follow-Up: At [facility], you and your health needs are our priority.  As part of our continuing mission to provide you with exceptional heart care, we have created designated Provider Care Teams.  These Care Teams include your primary Cardiologist (physician) and Advanced Practice Providers (APPs -  Physician Assistants and Nurse Practitioners) who all work together to provide you with the care you need, when you need it.  We recommend signing up for the patient portal called "MyChart".  Sign up information is provided on this After Visit Summary.  MyChart is used to connect with patients for Virtual Visits (Telemedicine).  Patients are able to view lab/test results, encounter notes, upcoming appointments, etc.  Non-urgent messages can be sent to your provider as well.   To learn more about what you can do with MyChart, go to [email or web address].    Your next appointment:   12 months with Dr Odessa Fleming PA-C

## 2023-01-14 NOTE — Progress Notes (Signed)
Patient Care Team: Fleet Contras, MD as PCP - General (Internal Medicine) Duke Salvia, MD as PCP - Electrophysiology (Cardiology)   HPI  William Davila is a 57 y.o. male Seen in followup for an ICD implanted 2013  for nonischemic cardiomyopathy; generator replacement 2021 he has a history of nonsustained ventricular tachycardia.  The patient denies chest pain, nocturnal dyspnea, orthopnea or peripheral edema.  There have been no palpitations, lightheadedness or syncope.   Able to walk throughout the office, do his grocery shopping carry groceries.  No history of sleep disordered breathing or sleep apnea.  Denies daytime somnolence     DATE TEST EF    3/13 Echo  20-25%    1/16 Echo 25-30%    1/18 Echo 20-25%    9/20 Echo 25-30%    9/20 LHC 25-30% Cors Normal   3/23 Echo  20-25%        Date Cr K Dig  12/16 1.21 3.8    2/18 1.26 4.1 0.5  9/20 1.09 3.8 0/4 (2/20)   9/21 1.28 4.5          Not very active  Past Medical History:  Diagnosis Date   AICD (automatic cardioverter/defibrillator) present    Arthritis    knee's   Cardiomyopathy (HCC)    Chronic systolic heart failure (HCC)    Diagnosed 2001; prior patient of Dr. Mendel Ryder with [facility] - discharged from practice   Dilated cardiomyopathy (HCC)    LVEF 10-20%   Essential hypertension, benign    Gout    Hyperlipidemia    Hypertension    ICD (implantable cardiac defibrillator) in place 10/27/2011   Morbid obesity (HCC)    Presence of combination internal cardiac defibrillator (ICD) and pacemaker    Presence of permanent cardiac pacemaker    VT (ventricular tachycardia) (HCC) 06/07/2011    Past Surgical History:  Procedure Laterality Date   BIOPSY  12/27/2017   Procedure: BIOPSY;  Surgeon: Benancio Deeds, MD;  Location: [facility];  Service: Gastroenterology;;   COLONOSCOPY WITH PROPOFOL N/A 12/27/2017   Procedure: COLONOSCOPY WITH PROPOFOL;  Surgeon: Benancio Deeds, MD;  Location: [facility];  Service: Gastroenterology;  Laterality: N/A;   ICD  10/27/2011   ICD GENERATOR CHANGEOUT N/A 02/22/2020   Procedure: ICD GENERATOR CHANGEOUT;  Surgeon: Duke Salvia, MD;  [facility] INVASIVE CV LAB;  Service: Cardiovascular;  Laterality: N/A;   IMPLANTABLE CARDIOVERTER DEFIBRILLATOR IMPLANT N/A 10/27/2011   Procedure: IMPLANTABLE CARDIOVERTER DEFIBRILLATOR IMPLANT;  Surgeon: Duke Salvia, MD;  Location: [facility] CATH LAB;  Service: Cardiovascular;  Laterality: N/A;   POLYPECTOMY  12/27/2017   Procedure: POLYPECTOMY;  Surgeon: Benancio Deeds, MD;  Location: [facility];  Service: Gastroenterology;;   RIGHT/LEFT HEART CATH AND CORONARY ANGIOGRAPHY N/A 03/01/2019   Procedure: RIGHT/LEFT HEART CATH AND CORONARY ANGIOGRAPHY;  Surgeon: Dolores Patty, MD;  Location: [facility] INVASIVE CV LAB;  Service: Cardiovascular;  Laterality: N/A;    Current Outpatient Medications  Medication Sig Dispense Refill   acetaminophen (TYLENOL) 500 MG tablet Take 1,000 mg by mouth every 6 (six) hours as needed for mild pain or moderate pain. For pain     allopurinol (ZYLOPRIM) 300 MG tablet Take 300 mg by mouth daily.   1   carvedilol (COREG) 25 MG tablet TAKE 1 TABLET(25 MG) BY MOUTH TWICE DAILY WITH A MEAL 180 tablet 3   clotrimazole-betamethasone (LOTRISONE) cream Apply 1 application  topically daily as needed (Rash).  colchicine 0.6 MG tablet Take 1 tablet by mouth 2 (two) times daily.     dapagliflozin propanediol (FARXIGA) 10 MG TABS tablet Take 1 tablet (10 mg total) by mouth daily before breakfast. 30 tablet 8   lidocaine (XYLOCAINE) 5 % ointment Apply 1 Application topically as needed. 35.44 g 0   metolazone (ZAROXOLYN) 2.5 MG tablet TAKE 1 TABLET BY MOUTH AS DIRECTED 5 tablet 0   naproxen (NAPROSYN) 500 MG tablet Take 500 mg by mouth 2 (two) times daily as needed (pain).     potassium chloride SA (KLOR-CON M) 20 MEQ tablet Take 1 tablet (20 mEq total) by mouth daily. 30 tablet 8    sacubitril-valsartan (ENTRESTO) 24-26 MG Take 1 tablet by mouth 2 (two) times daily. 180 tablet 2   spironolactone (ALDACTONE) 25 MG tablet TAKE 1 TABLET(25 MG) BY MOUTH DAILY 90 tablet 3   tiZANidine (ZANAFLEX) 4 MG tablet Take 4 mg by mouth 2 (two) times daily as needed for muscle spasms (Back).     torsemide (DEMADEX) 20 MG tablet TAKE 3 TABLETS(60 MG) BY MOUTH DAILY 270 tablet 3   No current facility-administered medications for this visit.    Allergies  Allergen Reactions   Hydralazine    Beta Adrenergic Blockers Other (See Comments)    REACTION: fatigue and increased dyspnea with beta blockers.   Isosorb Dinitrate-Hydralazine Other (See Comments)    REACTION: headache    Review of Systems negative except from HPI and PMH  Physical Exam   BP (!) 82/50   Pulse 97   Ht 5\' 10"  (1.778 m)   Wt (!) 346 lb (156.9 kg)   SpO2 98%   BMI 49.65 kg/m  Well developed and well nourished in no acute distress HENT normal Neck supple with JVP-flat Clear Device pocket well healed; without hematoma or erythema.  There is no tethering  Regular rate and rhythm, no  gallop No  murmur Abd-soft with active BS No Clubbing cyanosis  edema Skin-warm and dry A & Oriented  Grossly normal sensory and motor function  ECG sinus at 97 Intervals 23/15/38 Left bundle branch block like IVCD with Q waves 1, L and an RS limb lead V6 PVCs-frequent  Device function is normal. Programming changes none  See Paceart for details     Assessment and  Plan  NICM   ICD Medtronic The patient's device was interrogated.  The information was reviewed. No changes were made in the programming.     CHF chronic class II  IVCD left bundle branch like   Morbidly obese     HTN  PVCs  VT-nonsustained  With his cardiomyopathy, continue Entresto spironolactone carvedilol and Farxiga.  Intercurrent nonsustained ventricular tachycardia.  Continue carvedilol  His IVCD is left bundle branch like, there are  Q waves in lead I and L and a nonmonophasic R wave in lead V6.  QRS duration has been in the mid 140s today at 150.  As his symptoms worsen, I think the potential benefits of resynchronization are worth pursuing but at this juncture given his atypical left bundle branch like IVCD would hold off.  Blood pressure is low but without symptoms.  Will continue him on his current doses of GDMT   Denies daytime somnolence.  Will hold off on a sleep study

## 2023-01-15 ENCOUNTER — Other Ambulatory Visit (HOSPITAL_COMMUNITY): Payer: Self-pay | Admitting: Internal Medicine

## 2023-02-25 ENCOUNTER — Ambulatory Visit (INDEPENDENT_AMBULATORY_CARE_PROVIDER_SITE_OTHER): Payer: 59

## 2023-02-25 DIAGNOSIS — I428 Other cardiomyopathies: Secondary | ICD-10-CM | POA: Diagnosis not present

## 2023-02-25 LAB — CUP PACEART REMOTE DEVICE CHECK
Battery Remaining Longevity: 114 mo
Battery Voltage: 3.02 V
Brady Statistic RV Percent Paced: 0 %
Date Time Interrogation Session: 20240913052506
HighPow Impedance: 77 Ohm
HighPow Impedance: 98 Ohm
Implantable Lead Connection Status: 753985
Implantable Lead Implant Date: 20130515
Implantable Lead Location: 753860
Implantable Lead Model: 7121
Implantable Pulse Generator Implant Date: 20210910
Lead Channel Impedance Value: 1501 Ohm
Lead Channel Impedance Value: 304 Ohm
Lead Channel Pacing Threshold Amplitude: 0.75 V
Lead Channel Pacing Threshold Pulse Width: 0.4 ms
Lead Channel Sensing Intrinsic Amplitude: 10 mV
Lead Channel Sensing Intrinsic Amplitude: 10 mV
Lead Channel Setting Pacing Amplitude: 2 V
Lead Channel Setting Pacing Pulse Width: 0.4 ms
Lead Channel Setting Sensing Sensitivity: 0.3 mV
Zone Setting Status: 755011
Zone Setting Status: 755011

## 2023-03-02 NOTE — Progress Notes (Signed)
Remote ICD transmission.   

## 2023-03-14 ENCOUNTER — Other Ambulatory Visit (HOSPITAL_COMMUNITY): Payer: Self-pay | Admitting: Internal Medicine

## 2023-03-29 ENCOUNTER — Other Ambulatory Visit (HOSPITAL_COMMUNITY): Payer: Self-pay | Admitting: Family Medicine

## 2023-04-21 ENCOUNTER — Other Ambulatory Visit: Payer: Self-pay | Admitting: Cardiology

## 2023-05-02 ENCOUNTER — Other Ambulatory Visit (HOSPITAL_COMMUNITY): Payer: Self-pay | Admitting: Family Medicine

## 2023-05-24 ENCOUNTER — Other Ambulatory Visit (HOSPITAL_COMMUNITY): Payer: Self-pay | Admitting: Family Medicine

## 2023-05-26 ENCOUNTER — Other Ambulatory Visit (HOSPITAL_COMMUNITY): Payer: Self-pay

## 2023-05-26 DIAGNOSIS — I5042 Chronic combined systolic (congestive) and diastolic (congestive) heart failure: Secondary | ICD-10-CM

## 2023-05-26 MED ORDER — POTASSIUM CHLORIDE CRYS ER 20 MEQ PO TBCR
20.0000 meq | EXTENDED_RELEASE_TABLET | Freq: Every day | ORAL | 0 refills | Status: DC
Start: 2023-05-26 — End: 2023-06-22

## 2023-05-27 ENCOUNTER — Ambulatory Visit: Payer: 59

## 2023-05-27 DIAGNOSIS — I428 Other cardiomyopathies: Secondary | ICD-10-CM

## 2023-05-28 LAB — CUP PACEART REMOTE DEVICE CHECK
Battery Remaining Longevity: 111 mo
Battery Voltage: 3.02 V
Brady Statistic RV Percent Paced: 0 %
Date Time Interrogation Session: 20241213044224
HighPow Impedance: 103 Ohm
HighPow Impedance: 82 Ohm
Implantable Lead Connection Status: 753985
Implantable Lead Implant Date: 20130515
Implantable Lead Location: 753860
Implantable Lead Model: 7121
Implantable Pulse Generator Implant Date: 20210910
Lead Channel Impedance Value: 1558 Ohm
Lead Channel Impedance Value: 304 Ohm
Lead Channel Pacing Threshold Amplitude: 0.75 V
Lead Channel Pacing Threshold Pulse Width: 0.4 ms
Lead Channel Sensing Intrinsic Amplitude: 12.875 mV
Lead Channel Sensing Intrinsic Amplitude: 12.875 mV
Lead Channel Setting Pacing Amplitude: 2 V
Lead Channel Setting Pacing Pulse Width: 0.4 ms
Lead Channel Setting Sensing Sensitivity: 0.3 mV
Zone Setting Status: 755011
Zone Setting Status: 755011

## 2023-06-22 ENCOUNTER — Other Ambulatory Visit (HOSPITAL_COMMUNITY): Payer: Self-pay | Admitting: *Deleted

## 2023-06-22 ENCOUNTER — Other Ambulatory Visit (HOSPITAL_COMMUNITY): Payer: Self-pay | Admitting: Internal Medicine

## 2023-06-22 ENCOUNTER — Telehealth (HOSPITAL_COMMUNITY): Payer: Self-pay

## 2023-06-22 DIAGNOSIS — I5042 Chronic combined systolic (congestive) and diastolic (congestive) heart failure: Secondary | ICD-10-CM

## 2023-06-22 MED ORDER — POTASSIUM CHLORIDE CRYS ER 20 MEQ PO TBCR: 20.0000 meq | EXTENDED_RELEASE_TABLET | Freq: Every day | ORAL | 3 refills | Status: DC

## 2023-06-22 MED ORDER — POTASSIUM CHLORIDE CRYS ER 20 MEQ PO TBCR: 20.0000 meq | EXTENDED_RELEASE_TABLET | Freq: Every day | ORAL | 1 refills | Status: DC

## 2023-06-22 NOTE — Telephone Encounter (Signed)
 Patient's KCL medication has been sent to his pharmacy.

## 2023-06-28 ENCOUNTER — Other Ambulatory Visit (HOSPITAL_COMMUNITY): Payer: Self-pay | Admitting: Internal Medicine

## 2023-06-30 NOTE — Progress Notes (Signed)
Remote ICD transmission.   

## 2023-06-30 NOTE — Addendum Note (Signed)
Addended by: Elease Etienne A on: 06/30/2023 06:43 PM   Modules accepted: Orders

## 2023-07-19 ENCOUNTER — Ambulatory Visit (HOSPITAL_COMMUNITY)
Admission: RE | Admit: 2023-07-19 | Discharge: 2023-07-19 | Disposition: A | Discharge status: Home / Self Care | Payer: 59 | Source: Ambulatory Visit | Attending: Internal Medicine | Admitting: Internal Medicine

## 2023-07-19 ENCOUNTER — Encounter (HOSPITAL_COMMUNITY): Payer: Self-pay | Admitting: Internal Medicine

## 2023-07-19 ENCOUNTER — Ambulatory Visit (HOSPITAL_BASED_OUTPATIENT_CLINIC_OR_DEPARTMENT_OTHER)
Admission: RE | Admit: 2023-07-19 | Discharge: 2023-07-19 | Disposition: A | Discharge status: Home / Self Care | Payer: 59 | Source: Ambulatory Visit | Attending: Internal Medicine | Admitting: Internal Medicine

## 2023-07-19 DIAGNOSIS — Z6841 Body Mass Index (BMI) 40.0 and over, adult: Secondary | ICD-10-CM | POA: Insufficient documentation

## 2023-07-19 DIAGNOSIS — Z95810 Presence of automatic (implantable) cardiac defibrillator: Secondary | ICD-10-CM | POA: Insufficient documentation

## 2023-07-19 DIAGNOSIS — I110 Hypertensive heart disease with heart failure: Secondary | ICD-10-CM | POA: Insufficient documentation

## 2023-07-19 DIAGNOSIS — G4733 Obstructive sleep apnea (adult) (pediatric): Secondary | ICD-10-CM | POA: Diagnosis not present

## 2023-07-19 DIAGNOSIS — I428 Other cardiomyopathies: Secondary | ICD-10-CM | POA: Diagnosis not present

## 2023-07-19 DIAGNOSIS — R0681 Apnea, not elsewhere classified: Secondary | ICD-10-CM | POA: Insufficient documentation

## 2023-07-19 DIAGNOSIS — I5022 Chronic systolic (congestive) heart failure: Secondary | ICD-10-CM

## 2023-07-19 DIAGNOSIS — E785 Hyperlipidemia, unspecified: Secondary | ICD-10-CM | POA: Insufficient documentation

## 2023-07-19 DIAGNOSIS — I081 Rheumatic disorders of both mitral and tricuspid valves: Secondary | ICD-10-CM | POA: Diagnosis not present

## 2023-07-19 DIAGNOSIS — Z006 Encounter for examination for normal comparison and control in clinical research program: Secondary | ICD-10-CM

## 2023-07-19 DIAGNOSIS — E119 Type 2 diabetes mellitus without complications: Secondary | ICD-10-CM | POA: Insufficient documentation

## 2023-07-19 DIAGNOSIS — I5020 Unspecified systolic (congestive) heart failure: Secondary | ICD-10-CM | POA: Diagnosis present

## 2023-07-19 LAB — ECHOCARDIOGRAM COMPLETE
Area-P 1/2: 5.97 cm2
Calc EF: 30.1 %
MV M vel: 3.81 m/s
MV Peak grad: 57.9 mm[Hg]
Radius: 0.4 cm
S' Lateral: 6.6 cm
Single Plane A2C EF: 35 %
Single Plane A4C EF: 26.6 %

## 2023-07-19 LAB — BASIC METABOLIC PANEL
Anion gap: 9 (ref 5–15)
BUN: 26 mg/dL — ABNORMAL HIGH (ref 6–20)
CO2: 26 mmol/L (ref 22–32)
Calcium: 9.1 mg/dL (ref 8.9–10.3)
Chloride: 102 mmol/L (ref 98–111)
Creatinine, Ser: 1.56 mg/dL — ABNORMAL HIGH (ref 0.61–1.24)
GFR, Estimated: 52 mL/min — ABNORMAL LOW (ref >60)
Glucose, Bld: 106 mg/dL — ABNORMAL HIGH (ref 70–99)
Potassium: 3.5 mmol/L (ref 3.5–5.1)
Sodium: 137 mmol/L (ref 135–145)

## 2023-07-19 LAB — BRAIN NATRIURETIC PEPTIDE: B Natriuretic Peptide: 442.7 pg/mL — ABNORMAL HIGH (ref 0.0–100.0)

## 2023-07-19 MED ORDER — CARVEDILOL 6.25 MG PO TABS: ORAL_TABLET | ORAL | 6 refills | Status: DC

## 2023-07-19 MED ORDER — DIGOXIN 125 MCG PO TABS: 0.0625 mg | ORAL_TABLET | Freq: Every day | ORAL | 3 refills | Status: DC

## 2023-07-19 NOTE — Research (Signed)
 SITE: [id number]     Subject # [id number]    Subprotocol: A  Inclusion Criteria  Patients who meet all of the following criteria are eligible for enrollment as study participants:  Yes No  Age > 56 years old X   Eligible to wear Holter Study X    Exclusion Criteria  Patients who meet any of these criteria are not eligible for enrollment as study participants: Yes No  1. Receiving any mechanical (respiratory or circulatory) or renal support therapy at Screening or during Visit #1.  X  2.  Any other conditions that in the opinion of the investigators are likely to prevent compliance with the study protocol or pose a safety concern if the subject participates in the study.  X  3. Poor tolerance, namely susceptible to severe skin allergies from ECG adhesive patch application.  X   Protocol: REV H                                     Residential Zip code [address] (First 3 digits ONLY)                                             [employer] Informed Consent   Subject Name: William Davila  Subject met inclusion and exclusion criteria.  The informed consent form, study requirements and expectations were reviewed with the subject. Subject had opportunity to read consent and questions and concerns were addressed prior to the signing of the consent form.  The subject verbalized understanding of the trial requirements.  The subject agreed to participate in the [employer] EF ACT trial and signed the informed consent at 09:50 on 19-Jul-2023.  The informed consent was obtained prior to performance of any protocol-specific procedures for the subject.  A copy of the signed informed consent was given to the subject and a copy was placed in the subject's medical record.   William Davila          Current Outpatient Medications:    acetaminophen  (TYLENOL ) 500 MG tablet, Take 1,000 mg by mouth every 6 (six) hours as needed for mild pain or moderate pain. For pain, Disp: , Rfl:    allopurinol  (ZYLOPRIM ) 300 MG tablet, Take  300 mg by mouth daily. , Disp: , Rfl: 1   carvedilol  (COREG ) 25 MG tablet, TAKE 1 TABLET(25 MG) BY MOUTH TWICE DAILY WITH A MEAL, Disp: 180 tablet, Rfl: 3   clotrimazole-betamethasone (LOTRISONE) cream, Apply 1 application  topically daily as needed (Rash)., Disp: , Rfl:    colchicine  0.6 MG tablet, Take 1 tablet by mouth 2 (two) times daily., Disp: , Rfl:    dapagliflozin  propanediol (FARXIGA ) 10 MG TABS tablet, Take 1 tablet (10 mg total) by mouth daily. NEEDS FOLLOW UP APPOINTMENT FOR MORE REFILLS, Disp: 30 tablet, Rfl: 3   ENTRESTO  24-26 MG, TAKE 1 TABLET BY MOUTH TWICE DAILY, Disp: 180 tablet, Rfl: 2   metolazone  (ZAROXOLYN ) 2.5 MG tablet, TAKE 1 TABLET BY MOUTH AS DIRECTED, Disp: 5 tablet, Rfl: 0   naproxen  (NAPROSYN ) 500 MG tablet, Take 500 mg by mouth 2 (two) times daily as needed (pain)., Disp: , Rfl:    potassium chloride  SA (KLOR-CON  M) 20 MEQ tablet, Take 1 tablet (20 mEq total) by mouth daily., Disp: 90 tablet, Rfl: 3  torsemide  (DEMADEX ) 20 MG tablet, TAKE 3 TABLETS(60 MG) BY MOUTH DAILY, Disp: 270 tablet, Rfl: 3

## 2023-07-19 NOTE — Progress Notes (Signed)
 [facility] Note   Date:  07/19/2023   ID:  William Davila, DOB 05-15-1966, MRN [id number]  Location: Home  Provider location: [facility] Type of Visit: Established patient  PCP:  William Atlas, MD  EP:  Dr. Fernande HF Cardiologist: William Davila  Chief Complaint: Heart Failure follow-up   HPI: William Davila is a 58 y.o. male with systolic heart failure, cardiomyopathy diagnosed 2001, HLD, HTN, and obesity. S/P [employer] Jude single chamber ICD 10/2011. Intolerant Bidil due to headaches.    Discharged from [facility] 05/10/2011 Discharge weight 328.  Massive volume over load and low output. Cardiac output increased inotropes. Medications initiated ace, beta blocker, digoxin , and torsemide .    Follow up 4/23, had not been seen since 2/22. NYHA II and volume OK.  Admitted 1/24 to [facility] with COVID.  Yearly follow up 08/17/22, NYHA II-III and volume up. Farxiga  started and instructed to take a dose of metolazone  2.5/40 KCL x 1.    Today he returns for HF follow up. Says he feels fine. PCP stopped spiro due to SBP in 80s or lower). Carvedilol  also cut back to daily.  Now SBP running in 90s. Feels better off of it. Has gained back at least 20. Denies edema. Taking metolazone  about once a week with good control. Can do ADLs and go to store without issue.   Echo today EF 20-25% + dyssynchrony  moderate MR, RV Mod HK. Personally reviewed   ICD interrogation: No VT/AF. Activity level 1hr/day volume ok s3 up Personally reviewed    Cardiac Studies  - Echo (9/20): EF 25-30%  - Echo (1/18): EF 25-30%  - Echo (1/16): EF 25-30% RV normal   - Echo (3/13): EF 20-25%  Past Medical History:  Diagnosis Date   AICD (automatic cardioverter/defibrillator) present    Arthritis    knee's   Cardiomyopathy (HCC)    Chronic systolic heart failure (HCC)    Diagnosed 2001; prior patient of William Davila with [facility] - discharged from practice   Dilated  cardiomyopathy (HCC)    LVEF 10-20%   Essential hypertension, benign    Gout    Hyperlipidemia    Hypertension    ICD (implantable cardiac defibrillator) in place 10/27/2011   Morbid obesity (HCC)    Presence of combination internal cardiac defibrillator (ICD) and pacemaker    Presence of permanent cardiac pacemaker    VT (ventricular tachycardia) (HCC) 06/07/2011   Past Surgical History:  Procedure Laterality Date   BIOPSY  12/27/2017   Procedure: BIOPSY;  Surgeon: William William SQUIBB, MD;  Location: [facility];  Service: Gastroenterology;;   COLONOSCOPY WITH PROPOFOL  N/A 12/27/2017   Procedure: COLONOSCOPY WITH PROPOFOL ;  Surgeon: William William SQUIBB, MD;  Location: [facility];  Service: Gastroenterology;  Laterality: N/A;   ICD  10/27/2011   ICD GENERATOR CHANGEOUT N/A 02/22/2020   Procedure: ICD GENERATOR CHANGEOUT;  Surgeon: William William BROCKS, MD;  Location[facility] INVASIVE CV LAB;  Service: Cardiovascular;  Laterality: N/A;   IMPLANTABLE CARDIOVERTER DEFIBRILLATOR IMPLANT N/A 10/27/2011   Procedure: IMPLANTABLE CARDIOVERTER DEFIBRILLATOR IMPLANT;  Surgeon: William Davila Fernande, MD;  Location: [facility] CATH LAB;  Service: Cardiovascular;  Laterality: N/A;   POLYPECTOMY  12/27/2017   Procedure: POLYPECTOMY;  Surgeon: William William SQUIBB, MD;  Location: [facility];  Service: Gastroenterology;;   RIGHT/LEFT HEART CATH AND CORONARY ANGIOGRAPHY N/A 03/01/2019   Procedure: RIGHT/LEFT HEART CATH AND CORONARY ANGIOGRAPHY;  Surgeon: William Toribio SAUNDERS, MD;  Location: [facility] INVASIVE CV LAB;  Service: Cardiovascular;  Laterality: N/A;   Current Outpatient Medications  Medication Sig Dispense Refill   acetaminophen  (TYLENOL ) 500 MG tablet Take 1,000 mg by mouth every 6 (six) hours as needed for mild pain or moderate pain. For pain     allopurinol  (ZYLOPRIM ) 300 MG tablet Take 300 mg by mouth daily.   1   carvedilol  (COREG ) 25 MG tablet TAKE 1 TABLET(25 MG) BY MOUTH TWICE DAILY WITH A MEAL 180 tablet 3    clotrimazole-betamethasone (LOTRISONE) cream Apply 1 application  topically daily as needed (Rash).     colchicine  0.6 MG tablet Take 1 tablet by mouth 2 (two) times daily.     dapagliflozin  propanediol (FARXIGA ) 10 MG TABS tablet Take 1 tablet (10 mg total) by mouth daily. NEEDS FOLLOW UP APPOINTMENT FOR MORE REFILLS 30 tablet 3   ENTRESTO  24-26 MG TAKE 1 TABLET BY MOUTH TWICE DAILY 180 tablet 2   metolazone  (ZAROXOLYN ) 2.5 MG tablet TAKE 1 TABLET BY MOUTH AS DIRECTED 5 tablet 0   naproxen  (NAPROSYN ) 500 MG tablet Take 500 mg by mouth 2 (two) times daily as needed (pain).     potassium chloride  SA (KLOR-CON  M) 20 MEQ tablet Take 1 tablet (20 mEq total) by mouth daily. 90 tablet 3   torsemide  (DEMADEX ) 20 MG tablet TAKE 3 TABLETS(60 MG) BY MOUTH DAILY 270 tablet 3   No current facility-administered medications for this encounter.    Allergies:   Hydralazine , Beta adrenergic blockers, and Isosorb dinitrate-hydralazine    Social History:  The patient  reports that he has never smoked. He has never used smokeless tobacco. He reports current alcohol use of about 2.0 - 3.0 standard drinks of alcohol per week. He reports that he does not use drugs.   Family History:  The patient's family history includes Hypertension in his mother.   ROS:  Please see the history of present illness.   All other systems are personally reviewed and negative.   Recent Labs: 07/28/2022: Magnesium  1.7; NT-Pro BNP 2,492 08/17/2022: ALT 14 09/01/2022: B Natriuretic Peptide 211.4; BUN 22; Creatinine, Ser 1.55; Potassium 4.1; Sodium 136  Personally reviewed   BP (!) 90/50   Pulse (!) 106   Wt (!) 161.7 kg (356 lb 6.4 oz)   SpO2 97%   BMI 51.14 kg/m   Wt Readings from Last 3 Encounters:  07/19/23 (!) 161.7 kg (356 lb 6.4 oz)  01/14/23 (!) 156.9 kg (346 lb)  09/01/22 (!) 152.6 kg (336 lb 6.4 oz)   Physical Exam:  General:  Obese  No resp difficulty HEENT: normal Neck: supple. no JVD. Carotids 2+ bilat; no bruits.  No lymphadenopathy or thryomegaly appreciated. Cor: PMI nondisplaced. Regular rate & rhythm. +s3 3/6 MR  Lungs: clear Abdomen: obese  soft, nontender, nondistended. No hepatosplenomegaly. No bruits or masses. Good bowel sounds. Extremities: no cyanosis, clubbing, rash, edema Neuro: alert & orientedx3, cranial nerves grossly intact. moves all 4 extremities w/o difficulty. Affect pleasant  Device interrogation (personally reviewed): V  ASSESSMENT AND PLAN:  1. Chronic Systolic Heart Failure - Medtronic Single lead ICD 10/2011   - Echo 06/2016 LVEF 20-25%. Presumed NICM.  - Cath 9/20 no CAD  - Echo 9/21 EF 25-30%  - Echo today 07/19/23 EF 20-25% + dyssynchrony  moderate MR, RV Mod HK.  - Reports NYHA II symptoms but echo and exam (and ICD interrogation) suggests worse HF - Volume status ok  - Continue Farxiga  10 mg daily.  - Continue  torsemide  60 mg daily + 20 KCL daily. - Now on carvedilol  25 daily (cut from bid due to low BP). Will decrease to 6.25 bid - Continue Entresto  24/26 mg bid. - Off spiro with low BPs  - Previously off digoxin  with elevated level. Will restart at 0.0625 daily - Intolerant to Bidil due to severe headaches. - We referred him to Dr. Fernande. Had gen change in 9/21 but did not think he qualified for upgrade to CRT with IVCD. - As above. Reports NYHA II symptoms but objective data suggests worse. I suggested [facility] to further evaluate for potential VAD but he is not interested in this currently. I told him to let me know ASAP if getting worse.  - ICD interrogation: No VT/AF. Activity level 1hr/day volume ok s3 up Personally reviewed  2. Morbid Obesity - Body mass index is 51.14 kg/m. - Now back on Ozempic  3. Probable OSA - Refuses sleep study     Signed, Toribio Fuel, MD  07/19/2023 10:42 AM  [facility] [address] [facility] [place] [address] [phone] (office) [phone] (fax)

## 2023-07-19 NOTE — Patient Instructions (Addendum)
 Good to see you today!   DECREASE coreg  to 6.25 mg Twice daily  START Digoxin  0.0625 (1/2 tablet)  daily  Labs done today, your results will be available in MyChart, we will contact you for abnormal readings.  Your physician recommends that you schedule a follow-up appointment in: 3-4 months(June) Call office in April to schedule an appointment  If you have any questions or concerns before your next appointment please send us  a message through [place] or call our office at [phone].    TO LEAVE A MESSAGE FOR THE NURSE SELECT OPTION 2, PLEASE LEAVE A MESSAGE INCLUDING: YOUR NAME DATE OF BIRTH CALL BACK NUMBER REASON FOR CALL**this is important as we prioritize the call backs  YOU WILL RECEIVE A CALL BACK THE SAME DAY AS LONG AS YOU CALL BEFORE 4:00 PM At the [facility], you and your health needs are our priority. As part of our continuing mission to provide you with exceptional heart care, we have created designated Provider Care Teams. These Care Teams include your primary Cardiologist (physician) and Advanced Practice Providers (APPs- Physician Assistants and Nurse Practitioners) who all work together to provide you with the care you need, when you need it.   You may see any of the following providers on your designated Care Team at your next follow up: Dr Toribio Fuel Dr Ezra Shuck Dr. Ria Commander Dr. [name] Amy Lenetta, NP Caffie Shed, [place] [facility] [place], [place] Beckey Coe, NP Jordan Lee, NP Tinnie Redman, PharmD   Please be sure to bring in all your medications bottles to every appointment.    Thank you for choosing [facility]

## 2023-08-25 ENCOUNTER — Telehealth: Payer: Self-pay

## 2023-08-25 LAB — CUP PACEART REMOTE DEVICE CHECK
Battery Remaining Longevity: 108 mo
Battery Voltage: 3.02 V
Brady Statistic RV Percent Paced: 0 %
Date Time Interrogation Session: 20250313092543
HighPow Impedance: 102 Ohm
HighPow Impedance: 77 Ohm
Implantable Lead Connection Status: 753985
Implantable Lead Implant Date: 20130515
Implantable Lead Location: 753860
Implantable Lead Model: 7121
Implantable Pulse Generator Implant Date: 20210910
Lead Channel Impedance Value: 1539 Ohm
Lead Channel Impedance Value: 304 Ohm
Lead Channel Pacing Threshold Amplitude: 0.75 V
Lead Channel Pacing Threshold Pulse Width: 0.4 ms
Lead Channel Sensing Intrinsic Amplitude: 10 mV
Lead Channel Sensing Intrinsic Amplitude: 10 mV
Lead Channel Setting Pacing Amplitude: 2 V
Lead Channel Setting Pacing Pulse Width: 0.4 ms
Lead Channel Setting Sensing Sensitivity: 0.3 mV
Zone Setting Status: 755011
Zone Setting Status: 755011

## 2023-08-25 NOTE — Telephone Encounter (Signed)
 Alert received from [employer] for 1 VT episode 2/21 @ 12:33, HR 222, pace terminated with 1 burst of ATP - route to triage per protocol 11 NSVT, 5-7 beats in duration, HR's 194-261 HF diagnostics currently abnormal.  Attempted to contact patient. No answer, LMTCB.

## 2023-08-26 ENCOUNTER — Ambulatory Visit (INDEPENDENT_AMBULATORY_CARE_PROVIDER_SITE_OTHER): Payer: Medicare Other

## 2023-08-26 DIAGNOSIS — I428 Other cardiomyopathies: Secondary | ICD-10-CM

## 2023-08-26 NOTE — Telephone Encounter (Signed)
 Pt asymptomatic at time of event. No symptoms since. Compliant with meds. Sent to MD for review. Driving restrictions provided x 49m from 08/05/23.

## 2023-08-26 NOTE — Telephone Encounter (Signed)
 LMTCB

## 2023-08-26 NOTE — Addendum Note (Signed)
 Encounter addended by: Howell Rucks, RDCS on: 08/26/2023 3:28 PM  Actions taken: Imaging Exam ended

## 2023-09-05 ENCOUNTER — Other Ambulatory Visit (HOSPITAL_COMMUNITY): Payer: Self-pay

## 2023-09-05 DIAGNOSIS — I5042 Chronic combined systolic (congestive) and diastolic (congestive) heart failure: Secondary | ICD-10-CM

## 2023-09-05 MED ORDER — TORSEMIDE 20 MG PO TABS: ORAL_TABLET | ORAL | 3 refills | Status: AC

## 2023-09-09 ENCOUNTER — Telehealth: Payer: Self-pay

## 2023-09-09 NOTE — Telephone Encounter (Signed)
 Referred to ICM clinic by device clinic nurse Raj Janus, RN and Dr Graciela Husbands due to 3/14 Carelink report suggested possible fluid accumulation from 2/5-3/7.   Attempted call to patient for ICM intro and no answer.

## 2023-09-28 NOTE — Progress Notes (Signed)
 Remote ICD transmission.

## 2023-09-30 ENCOUNTER — Other Ambulatory Visit (HOSPITAL_COMMUNITY): Payer: Self-pay | Admitting: Family Medicine

## 2023-10-06 NOTE — Telephone Encounter (Signed)
 Unable to reach for ICM monthly follow up at this time.

## 2023-10-19 ENCOUNTER — Other Ambulatory Visit (HOSPITAL_COMMUNITY): Payer: Self-pay | Admitting: Internal Medicine

## 2023-10-29 ENCOUNTER — Ambulatory Visit: Admission: EM | Admit: 2023-10-29 | Discharge: 2023-10-29 | Disposition: A | Discharge status: Home / Self Care

## 2023-10-29 ENCOUNTER — Encounter: Payer: Self-pay | Admitting: *Deleted

## 2023-10-29 DIAGNOSIS — S39012A Strain of muscle, fascia and tendon of lower back, initial encounter: Secondary | ICD-10-CM | POA: Diagnosis not present

## 2023-10-29 DIAGNOSIS — I9589 Other hypotension: Secondary | ICD-10-CM

## 2023-10-29 MED ORDER — TIZANIDINE HCL 2 MG PO TABS: 4.0000 mg | ORAL_TABLET | Freq: Three times a day (TID) | ORAL | 0 refills | Status: AC | PRN

## 2023-10-29 MED ORDER — DEXAMETHASONE SODIUM PHOSPHATE 10 MG/ML IJ SOLN
10.0000 mg | Freq: Once | INTRAMUSCULAR | Status: AC
  Administered 2023-10-29: 10 mg via INTRAMUSCULAR

## 2023-10-29 NOTE — ED Triage Notes (Signed)
 Pt reports he strained his back yesterday bending over to wash his legs. He took tylenol  last night

## 2023-10-29 NOTE — Discharge Instructions (Signed)
 You can continue to take Tylenol  every 4-6 hours as needed for breakthrough pain should gradually subside as the steroid injection you received today reduces the inflammation.  May take tizanidine 4 mg every 8 hours as needed for back pain however as discussed this medication can cause drowsiness so avoid driving when taking this medication.  The steroid injection may elevate your blood sugar temporarily as a known side effect of steroid injections however your blood sugar should return   back to your normal baseline over the next 24 hours.

## 2023-10-29 NOTE — ED Provider Notes (Signed)
 [facility]    CSN: [id number] Arrival date & time: 10/29/23  1045      History   Chief Complaint Chief Complaint  Patient presents with   Back Pain    HPI William Davila is a 58 y.o. male.   William Davila is a 58 y.o. male who complains of an injury causing low back pain 1 day(s) ago. The pain is positional with bending or lifting, without radiation down the legs. Mechanism of injury: bending down to bathe in the shower and immediately experienced pain. Symptoms have been constant since that time. Prior history of back problems: recurrent self limited episodes of low back pain in the past. There is no numbness in the legs. He has not taken any medication for pain.    Past Medical History:  Diagnosis Date   AICD (automatic cardioverter/defibrillator) present    Arthritis    knee's   Cardiomyopathy (HCC)    Chronic systolic heart failure (HCC)    Diagnosed 2001; prior patient of Dr. Pauletta Boroughs with [facility] - discharged from practice   Dilated cardiomyopathy (HCC)    LVEF 10-20%   Essential hypertension, benign    Gout    Hyperlipidemia    Hypertension    ICD (implantable cardiac defibrillator) in place 10/27/2011   Morbid obesity (HCC)    Presence of combination internal cardiac defibrillator (ICD) and pacemaker    Presence of permanent cardiac pacemaker    VT (ventricular tachycardia) (HCC) 06/07/2011    Patient Active Problem List   Diagnosis Date Noted   ICD (implantable cardioverter-defibrillator) in place  - mdt 01/30/2020   Benign neoplasm of colon    Arthritis 07/12/2016   Thrombocytosis 09/11/2014   Type 2 diabetes mellitus with obesity (HCC) 07/31/2014   Special screening for malignant neoplasms, colon 07/24/2014   Leukocytosis 06/05/2014   Normocytic anemia 06/05/2014   Gout 05/06/2014   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   Encounter for fitting or adjustment of implantable cardioverter-defibrillator (ICD) 01/27/2012   Knee pain, right  06/17/2011   VT (ventricular tachycardia) (HCC) 06/07/2011   Chronic combined systolic and diastolic congestive heart failure (HCC) 05/02/2011   Quadriceps muscle rupture 02/02/2011   Knee pain, left 12/15/2010   Elevated uric acid in blood 08/14/2010   HYPERLIPIDEMIA 07/11/2009   Morbid obesity (HCC) 05/29/2008   NICM (nonischemic cardiomyopathy) (HCC) 05/29/2008   Essential hypertension 05/29/2008    Past Surgical History:  Procedure Laterality Date   BIOPSY  12/27/2017   Procedure: BIOPSY;  Surgeon: Ace Holder, MD;  Location: [facility];  Service: Gastroenterology;;   COLONOSCOPY WITH PROPOFOL  N/A 12/27/2017   Procedure: COLONOSCOPY WITH PROPOFOL ;  Surgeon: Ace Holder, MD;  Location: [facility];  Service: Gastroenterology;  Laterality: N/A;   ICD  10/27/2011   ICD GENERATOR CHANGEOUT N/A 02/22/2020   Procedure: ICD GENERATOR CHANGEOUT;  Surgeon: Verona Goodwill, MD;  Location: [facility] INVASIVE CV LAB;  Service: Cardiovascular;  Laterality: N/A;   IMPLANTABLE CARDIOVERTER DEFIBRILLATOR IMPLANT N/A 10/27/2011   Procedure: IMPLANTABLE CARDIOVERTER DEFIBRILLATOR IMPLANT;  Surgeon: Verona Goodwill, MD;  Location: [facility] CATH LAB;  Service: Cardiovascular;  Laterality: N/A;   POLYPECTOMY  12/27/2017   Procedure: POLYPECTOMY;  Surgeon: Ace Holder, MD;  Location: [facility];  Service: Gastroenterology;;   RIGHT/LEFT HEART CATH AND CORONARY ANGIOGRAPHY N/A 03/01/2019   Procedure: RIGHT/LEFT HEART CATH AND CORONARY ANGIOGRAPHY;  Surgeon: Mardell Shade, MD;  Location: [facility] INVASIVE CV LAB;  Service: Cardiovascular;  Laterality: N/A;  Home Medications    Prior to Admission medications   Medication Sig Start Date End Date Taking? Authorizing Provider  acetaminophen  (TYLENOL ) 500 MG tablet Take 1,000 mg by mouth every 6 (six) hours as needed for mild pain or moderate pain. For pain   Yes [provider]  allopurinol  (ZYLOPRIM ) 300 MG tablet Take 300 mg  by mouth daily.  11/16/17  Yes [provider]  carvedilol  (COREG ) 6.25 MG tablet TAKE 1 TABLET(25 MG) BY MOUTH TWICE DAILY WITH A MEAL 07/19/23  Yes Bensimhon, Rheta Celestine, MD  colchicine  0.6 MG tablet Take 1 tablet by mouth 2 (two) times daily. 02/10/16  Yes [provider]  digoxin  (LANOXIN ) 0.125 MG tablet Take 0.5 tablets (0.0625 mg total) by mouth daily. 07/19/23  Yes Bensimhon, Rheta Celestine, MD  ENTRESTO  24-26 MG TAKE 1 TABLET BY MOUTH TWICE DAILY 04/21/23  Yes Riddle, Suzann, NP  FARXIGA  10 MG TABS tablet TAKE 1 TABLET(10 MG) BY MOUTH DAILY. NEED FOLLOW UP APPOINTMENT FOR MORE REFILLS 10/03/23  Yes [place], [place], FNP  metolazone  (ZAROXOLYN ) 2.5 MG tablet TAKE 1 TABLET BY MOUTH AS DIRECTED 10/21/23  Yes Bensimhon, Rheta Celestine, MD  OZEMPIC, 2 MG/DOSE, 8 MG/3ML SOPN Inject 2 mg into the skin once a week. 10/18/23  Yes [provider]  potassium chloride  SA (KLOR-CON  M) 20 MEQ tablet Take 1 tablet (20 mEq total) by mouth daily. 06/22/23  Yes Bensimhon, Daniel R, MD  tiZANidine (ZANAFLEX) 2 MG tablet Take 2 tablets (4 mg total) by mouth every 8 (eight) hours as needed (Back pain). 10/29/23  Yes Buena Carmine, NP  torsemide  (DEMADEX ) 20 MG tablet TAKE 3 TABLETS(60 MG) BY MOUTH DAILY 09/05/23  Yes Bensimhon, Daniel R, MD  clotrimazole-betamethasone (LOTRISONE) cream Apply 1 application  topically daily as needed (Rash). Patient not taking: Reported on 10/29/2023    [provider]  naproxen  (NAPROSYN ) 500 MG tablet Take 500 mg by mouth 2 (two) times daily as needed (pain). Patient not taking: Reported on 10/29/2023    [provider]    Family History Family History  Problem Relation Age of Onset   Hypertension Mother     Social History Social History   Tobacco Use   Smoking status: Never   Smokeless tobacco: Never  Vaping Use   Vaping status: Never Used  Substance Use Topics   Alcohol use: Yes    Alcohol/week: 2.0 - 3.0 standard drinks of alcohol     Types: 2 - 3 Standard drinks or equivalent per week    Comment: occasionally (1-2 per week)   Drug use: No     Allergies   Hydralazine , Beta adrenergic blockers, and Isosorb dinitrate-hydralazine    Review of Systems Review of Systems  Musculoskeletal:  Positive for back pain.     Physical Exam Triage Vital Signs ED Triage Vitals  Encounter Vitals Group     BP 10/29/23 1201 (!) 88/63     Systolic BP Percentile --      Diastolic BP Percentile --      Pulse Rate 10/29/23 1201 (!) 105     Resp 10/29/23 1201 18     Temp 10/29/23 1201 97.8 F (36.6 C)     Temp Source 10/29/23 1201 Oral     SpO2 10/29/23 1201 97 %     Weight --      Height --      Head Circumference --      Peak Flow --  Pain Score 10/29/23 1157 8     Pain Loc --      Pain Education --      Exclude from Growth Chart --    No data found.  Updated Vital Signs BP 90/68 (BP Location: Left Wrist)   Pulse (!) 105   Temp 97.8 F (36.6 C) (Oral)   Resp 18   SpO2 97%   Visual Acuity Right Eye Distance:   Left Eye Distance:   Bilateral Distance:    Right Eye Near:   Left Eye Near:    Bilateral Near:     Physical Exam Vitals reviewed.  Constitutional:      Appearance: He is obese.  HENT:     Head: Normocephalic and atraumatic.  Eyes:     Extraocular Movements: Extraocular movements intact.  Cardiovascular:     Rate and Rhythm: Normal rate and regular rhythm.  Pulmonary:     Effort: Pulmonary effort is normal.     Breath sounds: Normal breath sounds.  Musculoskeletal:       Arms:     Cervical back: Normal range of motion.     Lumbar back: Tenderness present. Decreased range of motion. Negative right straight leg raise test and negative left straight leg raise test.  Skin:    General: Skin is warm.  Neurological:     General: No focal deficit present.     Mental Status: He is alert and oriented to person, place, and time.      UC Treatments / Results  Labs (all labs ordered are  listed, but only abnormal results are displayed) Labs Reviewed - No data to display  EKG   Radiology No results found.  Procedures Procedures (including critical care time)  Medications Ordered in UC Medications  dexamethasone  (DECADRON ) injection 10 mg (10 mg Intramuscular Given 10/29/23 1230)    Initial Impression / Assessment and Plan / UC Course  I have reviewed the triage vital signs and the nursing notes.  Pertinent labs & imaging results that were available during my care of the patient were reviewed by me and considered in my medical decision making (see chart for details).    Acute uncomplicated low back strain,Decadron  IM given here in clinic to reduce inflammation related to low back strain. Tizanidine 4 mg at bedtime as needed for acute pain. Follow-up with PCP if symptoms worsen or do not improve.   Chronic asymptomatic hypotension, reviewed prior encounters with cardiologist, current BP is consistent with readings at cardiologist office. Pt is asymptomatic with know CHF with inplanted ICD.  Final Clinical Impressions(s) / UC Diagnoses   Final diagnoses:  Back strain, initial encounter     Discharge Instructions      You can continue to take Tylenol  every 4-6 hours as needed for breakthrough pain should gradually subside as the steroid injection you received today reduces the inflammation.  May take tizanidine 4 mg every 8 hours as needed for back pain however as discussed this medication can cause drowsiness so avoid driving when taking this medication.  The steroid injection may elevate your blood sugar temporarily as a known side effect of steroid injections however your blood sugar should return   back to your normal baseline over the next 24 hours.   ED Prescriptions     Medication Sig Dispense Auth. Provider   tiZANidine (ZANAFLEX) 2 MG tablet Take 2 tablets (4 mg total) by mouth every 8 (eight) hours as needed (Back pain). 18 tablet Buena Carmine, NP  PDMP not reviewed this encounter.   Buena Carmine, NP 10/30/23 2110

## 2023-11-25 ENCOUNTER — Ambulatory Visit (INDEPENDENT_AMBULATORY_CARE_PROVIDER_SITE_OTHER): Payer: Self-pay

## 2023-11-25 DIAGNOSIS — I428 Other cardiomyopathies: Secondary | ICD-10-CM

## 2023-11-25 LAB — CUP PACEART REMOTE DEVICE CHECK
Battery Remaining Longevity: 105 mo
Battery Voltage: 3.02 V
Brady Statistic RV Percent Paced: 0 %
Date Time Interrogation Session: 20250613043825
HighPow Impedance: 105 Ohm
HighPow Impedance: 83 Ohm
Implantable Lead Connection Status: 753985
Implantable Lead Implant Date: 20130515
Implantable Lead Location: 753860
Implantable Lead Model: 7121
Implantable Pulse Generator Implant Date: 20210910
Lead Channel Impedance Value: 1558 Ohm
Lead Channel Impedance Value: 304 Ohm
Lead Channel Pacing Threshold Amplitude: 0.625 V
Lead Channel Pacing Threshold Pulse Width: 0.4 ms
Lead Channel Sensing Intrinsic Amplitude: 11.75 mV
Lead Channel Sensing Intrinsic Amplitude: 11.75 mV
Lead Channel Setting Pacing Amplitude: 2 V
Lead Channel Setting Pacing Pulse Width: 0.4 ms
Lead Channel Setting Sensing Sensitivity: 0.3 mV
Zone Setting Status: 755011
Zone Setting Status: 755011

## 2023-12-15 ENCOUNTER — Other Ambulatory Visit (HOSPITAL_COMMUNITY): Payer: Self-pay | Admitting: Internal Medicine

## 2024-01-12 NOTE — Progress Notes (Signed)
 Remote ICD transmission.

## 2024-01-18 ENCOUNTER — Other Ambulatory Visit: Payer: Self-pay | Admitting: Cardiology

## 2024-01-25 ENCOUNTER — Other Ambulatory Visit (HOSPITAL_COMMUNITY): Payer: Self-pay | Admitting: Family Medicine

## 2024-01-31 ENCOUNTER — Other Ambulatory Visit (HOSPITAL_COMMUNITY): Payer: Self-pay | Admitting: Internal Medicine

## 2024-02-06 ENCOUNTER — Telehealth (HOSPITAL_COMMUNITY): Payer: Self-pay | Admitting: Cardiology

## 2024-02-06 NOTE — Telephone Encounter (Signed)
 Called to confirm/remind patient of their appointment at the [facility] on 02/06/2024.   Appointment:   [] Confirmed  [x] Left mess   [] No answer/No voice mail  [] VM Full/unable to leave message  [] Phone not in service  Patient reminded to bring all medications and/or complete list.  Confirmed patient has transportation. Gave directions, instructed to utilize valet parking.

## 2024-02-07 ENCOUNTER — Ambulatory Visit (HOSPITAL_COMMUNITY): Payer: Self-pay | Admitting: Cardiology

## 2024-02-07 ENCOUNTER — Encounter (HOSPITAL_COMMUNITY): Payer: Self-pay | Admitting: Cardiology

## 2024-02-07 ENCOUNTER — Ambulatory Visit (HOSPITAL_COMMUNITY)
Admission: RE | Admit: 2024-02-07 | Discharge: 2024-02-07 | Disposition: A | Discharge status: Home / Self Care | Source: Ambulatory Visit | Attending: Cardiology | Admitting: Cardiology

## 2024-02-07 VITALS — BP 90/50 | HR 101 | Wt 331.6 lb

## 2024-02-07 DIAGNOSIS — I5042 Chronic combined systolic (congestive) and diastolic (congestive) heart failure: Secondary | ICD-10-CM

## 2024-02-07 DIAGNOSIS — I5022 Chronic systolic (congestive) heart failure: Secondary | ICD-10-CM

## 2024-02-07 LAB — BASIC METABOLIC PANEL WITH GFR
Anion gap: 12 (ref 5–15)
BUN: 28 mg/dL — ABNORMAL HIGH (ref 6–20)
CO2: 28 mmol/L (ref 22–32)
Calcium: 9.7 mg/dL (ref 8.9–10.3)
Chloride: 98 mmol/L (ref 98–111)
Creatinine, Ser: 1.65 mg/dL — ABNORMAL HIGH (ref 0.61–1.24)
GFR, Estimated: 48 mL/min — ABNORMAL LOW (ref >60)
Glucose, Bld: 88 mg/dL (ref 70–99)
Potassium: 3.8 mmol/L (ref 3.5–5.1)
Sodium: 138 mmol/L (ref 135–145)

## 2024-02-07 LAB — BRAIN NATRIURETIC PEPTIDE: B Natriuretic Peptide: 436.9 pg/mL — ABNORMAL HIGH (ref 0.0–100.0)

## 2024-02-07 LAB — DIGOXIN LEVEL: Digoxin Level: <0.6 ng/mL — ABNORMAL LOW (ref 0.8–2.0)

## 2024-02-07 MED ORDER — CARVEDILOL 6.25 MG PO TABS
6.2500 mg | ORAL_TABLET | Freq: Two times a day (BID) | ORAL | 3 refills | Status: AC
Start: 2024-02-07 — End: ?

## 2024-02-07 MED ORDER — CARVEDILOL 25 MG PO TABS: 25.0000 mg | ORAL_TABLET | Freq: Two times a day (BID) | ORAL | 3 refills | Status: DC

## 2024-02-07 NOTE — Patient Instructions (Signed)
 There has been no changes to your medications.  Labs done today, your results will be available in MyChart, we will contact you for abnormal readings.  Your physician recommends that you schedule a follow-up appointment in: 3 months ( November) ** PLEASE CALL THE OFFICE IN OCTOBER TO ARRANGE YOUR FOLLOW UP APPOINTMENT.**  If you have any questions or concerns before your next appointment please send us  a message through [place] or call our office at [phone].    TO LEAVE A MESSAGE FOR THE NURSE SELECT OPTION 2, PLEASE LEAVE A MESSAGE INCLUDING: YOUR NAME DATE OF BIRTH CALL BACK NUMBER REASON FOR CALL**this is important as we prioritize the call backs  YOU WILL RECEIVE A CALL BACK THE SAME DAY AS LONG AS YOU CALL BEFORE 4:00 PM  At the [facility], you and your health needs are our priority. As part of our continuing mission to provide you with exceptional heart care, we have created designated Provider Care Teams. These Care Teams include your primary Cardiologist (physician) and Advanced Practice Providers (APPs- Physician Assistants and Nurse Practitioners) who all work together to provide you with the care you need, when you need it.   You may see any of the following providers on your designated Care Team at your next follow up: Dr Toribio Fuel Dr Ezra Shuck Dr. Ria Commander Dr. [name] Amy Lenetta, NP Caffie Shed, [place] [facility] [place], [place] Beckey Coe, NP [place] Lee, NP Ellouise Class, NP Tinnie Redman, PharmD Jaun Bash, PharmD   Please be sure to bring in all your medications bottles to every appointment.    Thank you for choosing [facility]

## 2024-02-12 NOTE — Progress Notes (Signed)
   ADVANCED HEART FAILURE FOLLOW UP CLINIC NOTE  Referring Physician: Shelda Atlas, MD  Primary Care: Shelda Atlas, MD Primary Cardiologist:  HPI: William Davila is a 58 y.o. male with a PMH of systolic heart failure, cardiomyopathy diagnosed 2001, HLD, HTN, and obesity who presents for follow up of chronic systolic heart faliure.      Discharged from [facility] 05/10/2011 Discharge weight 328.  Massive volume over load and low output. Cardiac output increased inotropes. Medications initiated ace, beta blocker, digoxin , and torsemide .    Follow up 4/23, had not been seen since 2/22. NYHA II and volume OK.   Admitted 1/24 to [facility] with COVID.   Yearly follow up 08/17/22, NYHA II-III and volume up. Farxiga  started and instructed to take a dose of metolazone  2.5/40 KCL x 1.     SUBJECTIVE:  Patient reports that he is feeling fair. He notes that over the past few months he has had progressive worsening of his shortness of breath with exertion. He has had to decrease his heart failure medications recently. We spent a while discussing his heart failure as well as potential consideration of advanced therapies. We discussed LVAD as well as the potential risks and benefits of such an implant. He would like to think about it.   PMH, current medications, allergies, social history, and family history reviewed in epic.  PHYSICAL EXAM: Vitals:   02/07/24 1115  BP: (!) 90/50  Pulse: (!) 101  SpO2: 96%   GENERAL: Well nourished and in no apparent distress at rest.  PULM:  Normal work of breathing, clear to auscultation bilaterally. Respirations are unlabored.  CARDIAC:  JVP: flat         Normal rate with regular rhythm. No murmurs, rubs or gallops.  1+ edema. Warm and well perfused extremities. ABDOMEN: Soft, non-tender, non-distended. NEUROLOGIC: Patient is oriented x3 with no focal or lateralizing neurologic deficits.    DATA REVIEW  ECG: 01/2023: LBBB    ECHO: - 07/2023: LVEF 25-30%,  LVH, mildly reduced RV function, TV moderate to severe regurgitation  - Echo (9/20): EF 25-30%   - Echo (1/18): EF 25-30%   - Echo (1/16): EF 25-30% RV normal    - Echo (3/13): EF 20-25%   CATH: 02/2019: RA 7, PA 37/21 (28), PCWP 12, Normal output, normal coronary arteries    ASSESSMENT & PLAN:  Chronic Systolic Heart Failure - Medtronic Single lead ICD 10/2011   - Progressive symptoms, now NYHA Class III - Euvolemic - Review of EKG, would repeat at next visit and consider CRT, though I suspect that his cardiomyopathy may be too advanced to significantly benefit - Continue farxiga  10mg  daily - Continue carvedilol  6.25mg  BID - Continue entresto  24/26mg  BID - Not on MRA with low BP - Continue digoxin  0.0625mg  daily - On metolazone  2.5mg  prn - Long discussion about advanced therapies as above -  BNP likely too elevated for barostim - Low threshold for RHC at next visit. His progressive low BP and decrease in medications is concerning - We referred him to Dr. Fernande. Had gen change in 9/21 but did not think he qualified for upgrade to CRT with IVCD.  2. Morbid Obesity - Continues on on Ozempic   3. Probable OSA - Refuses sleep study     Follow up in 3 months  [name], MD Advanced Heart Failure Mechanical Circulatory Support 02/12/24

## 2024-02-16 ENCOUNTER — Encounter: Payer: Self-pay | Admitting: Internal Medicine

## 2024-02-19 ENCOUNTER — Other Ambulatory Visit: Payer: Self-pay | Admitting: Internal Medicine

## 2024-02-24 ENCOUNTER — Ambulatory Visit (INDEPENDENT_AMBULATORY_CARE_PROVIDER_SITE_OTHER): Payer: Self-pay

## 2024-02-24 DIAGNOSIS — I5022 Chronic systolic (congestive) heart failure: Secondary | ICD-10-CM | POA: Diagnosis not present

## 2024-02-27 LAB — CUP PACEART REMOTE DEVICE CHECK
Battery Remaining Longevity: 102 mo
Battery Voltage: 3.02 V
Brady Statistic RV Percent Paced: 0 %
Date Time Interrogation Session: 20250912012305
HighPow Impedance: 111 Ohm
HighPow Impedance: 86 Ohm
Implantable Lead Connection Status: 753985
Implantable Lead Implant Date: 20130515
Implantable Lead Location: 753860
Implantable Lead Model: 7121
Implantable Pulse Generator Implant Date: 20210910
Lead Channel Impedance Value: 1596 Ohm
Lead Channel Impedance Value: 323 Ohm
Lead Channel Pacing Threshold Amplitude: 0.625 V
Lead Channel Pacing Threshold Pulse Width: 0.4 ms
Lead Channel Sensing Intrinsic Amplitude: 11.625 mV
Lead Channel Sensing Intrinsic Amplitude: 11.625 mV
Lead Channel Setting Pacing Amplitude: 2 V
Lead Channel Setting Pacing Pulse Width: 0.4 ms
Lead Channel Setting Sensing Sensitivity: 0.3 mV
Zone Setting Status: 755011
Zone Setting Status: 755011

## 2024-03-02 NOTE — Progress Notes (Signed)
Remote ICD Transmission.

## 2024-03-04 ENCOUNTER — Ambulatory Visit: Payer: Self-pay | Admitting: Cardiology

## 2024-04-07 ENCOUNTER — Ambulatory Visit: Payer: Self-pay | Admitting: Cardiology

## 2024-05-09 ENCOUNTER — Other Ambulatory Visit (HOSPITAL_COMMUNITY): Payer: Self-pay

## 2024-05-09 MED ORDER — METOLAZONE 2.5 MG PO TABS: 2.5000 mg | ORAL_TABLET | ORAL | 0 refills | Status: AC

## 2024-05-25 ENCOUNTER — Ambulatory Visit: Payer: Self-pay

## 2024-05-25 DIAGNOSIS — I5022 Chronic systolic (congestive) heart failure: Secondary | ICD-10-CM

## 2024-05-25 LAB — CUP PACEART REMOTE DEVICE CHECK
Battery Remaining Longevity: 98 mo
Battery Voltage: 3.02 V
Brady Statistic RV Percent Paced: 0 %
Date Time Interrogation Session: 20251212044223
HighPow Impedance: 78 Ohm
HighPow Impedance: 99 Ohm
Implantable Lead Connection Status: 753985
Implantable Lead Implant Date: 20130515
Implantable Lead Location: 753860
Implantable Lead Model: 7121
Implantable Pulse Generator Implant Date: 20210910
Lead Channel Impedance Value: 1558 Ohm
Lead Channel Impedance Value: 304 Ohm
Lead Channel Pacing Threshold Amplitude: 0.875 V
Lead Channel Pacing Threshold Pulse Width: 0.4 ms
Lead Channel Sensing Intrinsic Amplitude: 10.5 mV
Lead Channel Sensing Intrinsic Amplitude: 10.5 mV
Lead Channel Setting Pacing Amplitude: 2 V
Lead Channel Setting Pacing Pulse Width: 0.4 ms
Lead Channel Setting Sensing Sensitivity: 0.3 mV
Zone Setting Status: 755011
Zone Setting Status: 755011

## 2024-06-01 NOTE — Progress Notes (Signed)
 Remote ICD Transmission

## 2024-06-10 ENCOUNTER — Ambulatory Visit: Payer: Self-pay | Admitting: Cardiology

## 2024-06-11 ENCOUNTER — Other Ambulatory Visit: Payer: Self-pay

## 2024-06-11 DIAGNOSIS — I5042 Chronic combined systolic (congestive) and diastolic (congestive) heart failure: Secondary | ICD-10-CM

## 2024-06-12 NOTE — Telephone Encounter (Signed)
 Call x1; lvmtcb to schedule patient from recall w/ EP APPs for overdue 1 yr (due Aug 2025) + f/u on 12/12 remote transmission.. VT appropriately treated with ATP; ICD/MDT.

## 2024-06-13 MED ORDER — POTASSIUM CHLORIDE CRYS ER 20 MEQ PO TBCR: 20.0000 meq | EXTENDED_RELEASE_TABLET | Freq: Every day | ORAL | 3 refills | Status: AC

## 2024-06-13 NOTE — Telephone Encounter (Signed)
 Patient returned call - he is scheduled with EP APP on 1/29.

## 2024-06-20 ENCOUNTER — Other Ambulatory Visit (HOSPITAL_COMMUNITY): Payer: Self-pay | Admitting: Family Medicine

## 2024-06-21 ENCOUNTER — Telehealth (HOSPITAL_COMMUNITY): Payer: Self-pay | Admitting: *Deleted

## 2024-06-21 NOTE — Telephone Encounter (Signed)
 Called to confirm/remind patient of their appointment at the [facility] on  06/22/24:      Appointment:              [x] Confirmed             [] Left mess              [] No answer/No voice mail             [] Phone not in service   Patient reminded to bring all medications and/or complete list.   Confirmed patient has transportation. Gave directions, instructed to utilize valet parking.

## 2024-06-22 ENCOUNTER — Ambulatory Visit (HOSPITAL_COMMUNITY)
Admission: RE | Admit: 2024-06-22 | Discharge: 2024-06-22 | Disposition: A | Source: Ambulatory Visit | Attending: Adult Health | Admitting: Adult Health

## 2024-06-22 ENCOUNTER — Ambulatory Visit (HOSPITAL_COMMUNITY): Payer: Self-pay | Admitting: Adult Health

## 2024-06-22 VITALS — BP 94/62 | HR 108 | Wt 331.0 lb

## 2024-06-22 DIAGNOSIS — Z6841 Body Mass Index (BMI) 40.0 and over, adult: Secondary | ICD-10-CM | POA: Diagnosis not present

## 2024-06-22 DIAGNOSIS — I5042 Chronic combined systolic (congestive) and diastolic (congestive) heart failure: Secondary | ICD-10-CM

## 2024-06-22 DIAGNOSIS — Z79899 Other long term (current) drug therapy: Secondary | ICD-10-CM | POA: Diagnosis not present

## 2024-06-22 DIAGNOSIS — I472 Ventricular tachycardia, unspecified: Secondary | ICD-10-CM

## 2024-06-22 DIAGNOSIS — N183 Chronic kidney disease, stage 3 unspecified: Secondary | ICD-10-CM | POA: Diagnosis not present

## 2024-06-22 DIAGNOSIS — I428 Other cardiomyopathies: Secondary | ICD-10-CM | POA: Diagnosis not present

## 2024-06-22 DIAGNOSIS — Z95810 Presence of automatic (implantable) cardiac defibrillator: Secondary | ICD-10-CM | POA: Diagnosis not present

## 2024-06-22 DIAGNOSIS — I130 Hypertensive heart and chronic kidney disease with heart failure and stage 1 through stage 4 chronic kidney disease, or unspecified chronic kidney disease: Secondary | ICD-10-CM | POA: Insufficient documentation

## 2024-06-22 DIAGNOSIS — I429 Cardiomyopathy, unspecified: Secondary | ICD-10-CM | POA: Diagnosis not present

## 2024-06-22 DIAGNOSIS — E785 Hyperlipidemia, unspecified: Secondary | ICD-10-CM | POA: Insufficient documentation

## 2024-06-22 DIAGNOSIS — I5022 Chronic systolic (congestive) heart failure: Secondary | ICD-10-CM | POA: Diagnosis not present

## 2024-06-22 LAB — BASIC METABOLIC PANEL WITH GFR
Anion gap: 12 (ref 5–15)
BUN: 27 mg/dL — ABNORMAL HIGH (ref 6–20)
CO2: 27 mmol/L (ref 22–32)
Calcium: 9.6 mg/dL (ref 8.9–10.3)
Chloride: 101 mmol/L (ref 98–111)
Creatinine, Ser: 1.58 mg/dL — ABNORMAL HIGH (ref 0.61–1.24)
GFR, Estimated: 51 mL/min — ABNORMAL LOW
Glucose, Bld: 100 mg/dL — ABNORMAL HIGH (ref 70–99)
Potassium: 3.7 mmol/L (ref 3.5–5.1)
Sodium: 140 mmol/L (ref 135–145)

## 2024-06-22 LAB — PRO BRAIN NATRIURETIC PEPTIDE: Pro Brain Natriuretic Peptide: 2177 pg/mL — ABNORMAL HIGH

## 2024-06-22 LAB — MAGNESIUM: Magnesium: 1.9 mg/dL (ref 1.7–2.4)

## 2024-06-22 MED ORDER — DAPAGLIFLOZIN PROPANEDIOL 10 MG PO TABS: 10.0000 mg | ORAL_TABLET | Freq: Every day | ORAL | 3 refills | Status: AC

## 2024-06-22 NOTE — Patient Instructions (Signed)
 Medication Changes:  FARXIGA  REFILLED  Lab Work:  Labs done today, your results will be available in MyChart, we will contact you for abnormal readings.   Testing/Procedures:  Expect to be in the lab for 2 hours. Please plan to arrive 30 minutes prior to your appointment. You may be asked to reschedule your test if you arrive 20 minutes or more after your scheduled appointment time.  Main Campus address: [address] [place], [place] [address] You may arrive to the Main Entrance A or Entrance C (free valet parking is available at both). -Main Entrance A (on [address]) :proceed to admitting for check in -Entrance C (on [employer]): proceed to [employer] parking or under hospital deck parking using this code _________  Check In: [facility] waiting room (1st floor)   General Instructions for the day of the test (Please follow all instructions from your physician): Refrain from ingesting a heavy meal, alcohol, or caffeine or using tobacco products within 2 hours of the test (DO NOT FAST for mare than 8 hours). You may have all other non-alcoholic, non -caffeinated beverage,a light snack (crackers,a piece of fruit, carrot sticks, toast bagel,etc) up to your appointment. Avoid significant exertion or exercise within 24 hours of your test. Be prepared to exercise and sweat. Your clothing should permit freedom of movement and include walking or running shoes. Women bring loose fitting short sleeved blouse.  This evaluation may be fatiguing and you may wish ti have someone accompany you to the assessment to drive you home afterward. Bring a list of your medications with you, including dosage and frequency you take the medications (  I.e.,once per day, twice per day, etc). Take all medications as prescribed, unless noted below or instructed to do so by your physician.  Please do not take the following medications prior to your  CPX:  _________________________________________________  _________________________________________________  Brief description of the test: A brief lung test will be performed. This will involve you taking deep breaths and blowing hard and fast through your mouth. During these , a clip will be on your nose and you will be breathing through a breathing device.   For the exercise portion of the test you will be walking on a treadmill, or riding a stationary bike, to your maximal effor or until symptoms such as chest pain, shortness of breath, leg pain or dizziness limit your exercise. You will be breathing in and out of a breathing device through your mouth (a clip will be on your nose again). Your heart rate, ECG, blood pressure, oxygen saturations, breathing rate and depth, amount of oxygen you consume and amount of carbon dioxide you produce will be measured and monitored throughout the exercise test.  If you need to cancel or reschedule your appointment please call [phone] If you have further questions please call your physician or Josette Pesa, MS, ACSM-RCEP at [phone]  Follow-Up in: WITH DR. ZENAIDA IN 6 WEEKS   At the [facility], you and your health needs are our priority. We have a designated team specialized in the treatment of Heart Failure. This Care Team includes your primary Heart Failure Specialized Cardiologist (physician), Advanced Practice Providers (APPs- Physician Assistants and Nurse Practitioners), and Pharmacist who all work together to provide you with the care you need, when you need it.   You may see any of the following providers on your designated Care Team at your next follow up:  Dr. Toribio Fuel Dr. Ezra Shuck Dr.  Odis Brownie Greig Mosses, NP Caffie Shed, [place] [facility] [place], [place] Beckey Coe, NP Jordan Lee, NP Tinnie Redman, PharmD   Please be sure to bring in all your medications bottles to every appointment.    Need to Contact Us :  If you have any questions or concerns before your next appointment please send us  a message through [place] or call our office at [phone].    TO LEAVE A MESSAGE FOR THE NURSE SELECT OPTION 2, PLEASE LEAVE A MESSAGE INCLUDING: YOUR NAME DATE OF BIRTH CALL BACK NUMBER REASON FOR CALL**this is important as we prioritize the call backs  YOU WILL RECEIVE A CALL BACK THE SAME DAY AS LONG AS YOU CALL BEFORE 4:00 PM

## 2024-06-22 NOTE — Progress Notes (Signed)
" ° °  ADVANCED HEART FAILURE FOLLOW UP CLINIC NOTE  Referring Physician: Shelda Atlas, MD  Primary Care: Shelda Atlas, MD Primary Cardiologist:  HPI: William Davila is a 59 y.o. male with a PMH of chronic HFrEF, cardiomyopathy diagnosed 2001, HLD, HTN, and obesity.    Discharged from [facility] 05/10/2011 Discharge weight 328.  Massive volume over load and low output. Cardiac output increased inotropes. Medications initiated ace, beta blocker, digoxin , and torsemide .    Follow up 4/23, had not been seen since 2/22. NYHA II and volume OK.   Admitted 1/24 to [facility] with COVID.   Yearly follow up 08/17/22, NYHA II-III and volume up. Farxiga  started and instructed to take a dose of metolazone  2.5/40 KCL x 1.   03/17/24- VT WCT treated with ATP. 36 NSVT  SUBJECTIVE: Today he returns for HF follow up. Limited by shortness of breath and fatigue. Can walk slowly around the grocery store.  Denies PND/Orthopnea. Appetite ok. No fever or chills. Rarely weighing at home. Taking all medications. Lives with his brother. He does not smoke. Drinks a few beers on the weekend.   PMH, current medications, allergies, social history, and family history reviewed in epic.  PHYSICAL EXAM: Vitals:   06/22/24 1142  BP: 94/62  Pulse: (!) 108  SpO2: 96%   Wt Readings from Last 3 Encounters:  06/22/24 (!) 150.1 kg (331 lb)  02/07/24 (!) 150.4 kg (331 lb 9.6 oz)  07/19/23 (!) 161.7 kg (356 lb 6.4 oz)    General:   No resp difficulty. Walked slowly in the clinic.  Neck: no JVD.  Cor: Regular rate & rhythm.  Lungs: clear Abdomen: soft, nontender, nondistended.  Extremities: no  edema Neuro: alert & oriented x3  EKG: ST 103 LBBB 150 ms   Optivol- activity < 1 hour, NO VT/AFib. Fluid index slight`  DATA REVIEW ECG: 01/2023: LBBB    ECHO: - Echo (07/2023): LVEF 25-30%, LVH, mildly reduced RV function, TV moderate to severe regurgitation - Echo (9/20): EF 25-30%  - Echo (1/18): EF 25-30%  - Echo (1/16): EF  25-30% RV normal   - Echo (3/13): EF 20-25%   CATH: 02/2019: RA 7, PA 37/21 (28), PCWP 12, Normal output, normal coronary arteries    ASSESSMENT & PLAN:  1. Chronic Systolic Heart Failure - Medtronic Single lead ICD 10/2011   - QRS 150 ms. F/U with EP ? Possible device upgrade.  - NYHA III. GDMT limited by soft BP.  - Continue farxiga  10mg  daily - Continue carvedilol  6.25mg  BID - Continue entresto  24/26mg  BID - Not on MRA with low BP - Continue digoxin  0.0625mg  daily - On metolazone  2.5mg  prn --Check CPX test. Concerned he may need advanced therapies.   2. Morbid Obesity Body mass index is 47.49 kg/m. - Continues on on Ozempic   3. Probable OSA - Refuses sleep study   4. VT 03/17/24  WCT --> Treated with ATP  36 episodes of NSVT.  EP follow 07/12/24 Check BMET and Mag  5. CKD Stage III Creatinine baseline ~ 1.5  Check BMET    Follow up 6 weeks with Dr Zenaida to discuss CPX results.    Marli Diego NP-C  06/22/2024 "

## 2024-06-26 ENCOUNTER — Ambulatory Visit (HOSPITAL_COMMUNITY): Attending: Cardiology

## 2024-06-26 DIAGNOSIS — I5042 Chronic combined systolic (congestive) and diastolic (congestive) heart failure: Secondary | ICD-10-CM | POA: Diagnosis present

## 2024-06-26 DIAGNOSIS — I110 Hypertensive heart disease with heart failure: Secondary | ICD-10-CM | POA: Insufficient documentation

## 2024-06-26 DIAGNOSIS — R5383 Other fatigue: Secondary | ICD-10-CM | POA: Insufficient documentation

## 2024-06-26 DIAGNOSIS — I428 Other cardiomyopathies: Secondary | ICD-10-CM | POA: Diagnosis present

## 2024-07-06 ENCOUNTER — Other Ambulatory Visit: Payer: Self-pay

## 2024-07-09 MED ORDER — DIGOXIN 125 MCG PO TABS: 0.0625 mg | ORAL_TABLET | Freq: Every day | ORAL | 3 refills | Status: AC

## 2024-07-12 ENCOUNTER — Encounter: Payer: Self-pay | Admitting: Medical

## 2024-07-12 ENCOUNTER — Ambulatory Visit: Admitting: Medical

## 2024-07-12 VITALS — BP 94/58 | HR 112 | Ht 70.0 in | Wt 335.0 lb

## 2024-07-12 DIAGNOSIS — I428 Other cardiomyopathies: Secondary | ICD-10-CM

## 2024-07-12 DIAGNOSIS — I472 Ventricular tachycardia, unspecified: Secondary | ICD-10-CM

## 2024-07-12 DIAGNOSIS — I5042 Chronic combined systolic (congestive) and diastolic (congestive) heart failure: Secondary | ICD-10-CM

## 2024-07-12 DIAGNOSIS — Z95810 Presence of automatic (implantable) cardiac defibrillator: Secondary | ICD-10-CM

## 2024-07-12 LAB — CUP PACEART INCLINIC DEVICE CHECK
Battery Remaining Longevity: 97 mo
Battery Voltage: 2.96 V
Brady Statistic RV Percent Paced: 0 %
Date Time Interrogation Session: 20260129122012
HighPow Impedance: 104 Ohm
HighPow Impedance: 78 Ohm
Implantable Lead Connection Status: 753985
Implantable Lead Implant Date: 20130515
Implantable Lead Location: 753860
Implantable Lead Model: 7121
Implantable Pulse Generator Implant Date: 20210910
Lead Channel Impedance Value: 1596 Ohm
Lead Channel Impedance Value: 304 Ohm
Lead Channel Pacing Threshold Amplitude: 0.875 V
Lead Channel Pacing Threshold Pulse Width: 0.4 ms
Lead Channel Sensing Intrinsic Amplitude: 11.875 mV
Lead Channel Sensing Intrinsic Amplitude: 9.875 mV
Lead Channel Setting Pacing Amplitude: 2 V
Lead Channel Setting Pacing Pulse Width: 0.4 ms
Lead Channel Setting Sensing Sensitivity: 0.3 mV
Zone Setting Status: 755011
Zone Setting Status: 755011

## 2024-07-12 NOTE — Patient Instructions (Signed)
 Medication Instructions:  Your physician recommends that you continue on your current medications as directed. Please refer to the Current Medication list given to you today.  *If you need a refill on your cardiac medications before your next appointment, please call your pharmacy*  Lab Work: None ordered If you have labs (blood work) drawn today and your tests are completely normal, you will receive your results only by: MyChart Message (if you have MyChart) OR A paper copy in the mail If you have any lab test that is abnormal or we need to change your treatment, we will call you to review the results.  Follow-Up: At [facility], you and your health needs are our priority.  As part of our continuing mission to provide you with exceptional heart care, our providers are all part of one team.  This team includes your primary Cardiologist (physician) and Advanced Practice Providers or APPs (Physician Assistants and Nurse Practitioners) who all work together to provide you with the care you need, when you need it.  Your next appointment:   June 2026  Provider:   Fonda Kitty, MD    We recommend signing up for the patient portal called MyChart.  Sign up information is provided on this After Visit Summary.  MyChart is used to connect with patients for Virtual Visits (Telemedicine).  Patients are able to view lab/test results, encounter notes, upcoming appointments, etc.  Non-urgent messages can be sent to your provider as well.   To learn more about what you can do with MyChart, go to [email or web address].

## 2024-07-12 NOTE — Progress Notes (Addendum)
 " Electrophysiology Office Note:   ID:  STEPAN VERRETTE, DOB [birth year], MRN [id number]  Primary Cardiologist: None Heart Failure: Toribio Fuel, MD Electrophysiologist: Fonda Kitty, MD  Click to update primary MD,subspecialty MD or APP then REFRESH:1}    History of Present Illness:   William Davila is a 59 y.o. male with h/o CHF, HLD, HTN, obesity, h/o VT, and CKD III seen today for routine electrophysiology followup.  Followed by CHF   Pt has previously had VT treated with ATP (last event ~03-17-24)   Feels like he is doing well but recognizes ShOB.   Heart failure symptoms and functional status - NYHA class II to III heart failure with ejection fraction 25-30% - No recent shortness of breath, chest pain, syncope, or sustained palpitations - Occasional brief palpitations when winded with exertion - Sleeps with three pillows to breathe comfortably - Able to sleep on side or back  Cardiac device management - Cardiac device implanted in 2021 - Device has never delivered a shock - Battery life estimated at ten years - Uses wireless home monitoring device  Medication adherence and effects - Takes carvedilol  daily; took yesterday but not yet today though he states that he is consistent with his Rx. - Takes metolazone  weekly on Fridays; perceives improved mobility with use - Limits salty foods but occasionally consumes them for flavor  Sleep-disordered breathing evaluation - Does not use CPAP - Never tested for sleep apnea despite prior recommendation  Tricuspic Regurg Moderate to Severe from Feb 2025 Echo  Review of systems complete and found to be negative unless listed in HPI.   EP Information / Studies Reviewed:    EKG is ordered today. Personal review as below. Sinus tach with 1st degree block and with Left bundle QRS width 144 ms  EKG Interpretation Date/Time:  Thursday July 12 2024 10:06:29 EST Ventricular Rate:  112 PR Interval:  264 QRS Duration:  144 QT  Interval:  414 QTC Calculation: 565 R Axis:   138  Text Interpretation:   Critical Test Result: Long QTc Sinus tachycardia with 1st degree A-V block Non-specific intra-ventricular conduction block Inferior infarct , age undetermined Cannot rule out Anterior infarct , age undetermined When compared with ECG of 22-Jun-2024 11:48, Sinus rhythm has replaced Wide QRS rhythm Confirmed by Kimberlie Csaszar ([id number]) on 07/12/2024 12:34:58 PM    ICD Interrogation-  reviewed in detail today,  See PACEART report.  Arrhythmia/Device History MDT Single Chamber ICD 2013 for CHF    Physical Exam:   VS:  BP (!) 94/58   Pulse (!) 112   Ht 5' 10 (1.778 m)   Wt (!) 335 lb (152 kg)   SpO2 98%   BMI 48.07 kg/m    Wt Readings from Last 3 Encounters:  07/12/24 (!) 335 lb (152 kg)  06/22/24 (!) 331 lb (150.1 kg)  02/07/24 (!) 331 lb 9.6 oz (150.4 kg)     GEN: No acute distress well appearing. Obese  NECK: No JVD; No carotid bruits CARDIAC: Reg Tachy Rhythm, no murmurs, rubs, gallops RESPIRATORY:  Clear to auscultation without rales, wheezing or rhonchi  ABDOMEN: Soft, non-tender, non-distended EXTREMITIES:  Trace edema; No deformity  Warmdry pink mucous membranes ASSESSMENT AND PLAN:    Chronic systolic CHF  Heart failure with reduced ejection fraction (EF 25-35%) Heart failure with reduced EF, currently stable. Elevated heart rate likely due to missed carvedilol . Discussed biventricular pacing benefits, contingent on tricuspid valve status and cardiology consensus. - Continue carvedilol  as prescribed. -  Discuss potential biventricular upgrade with cardiology team. - Monitor heart rate and symptoms. euvolemic today Stable on an appropriate medical regimen Normal ICD function See Pace Art report No changes today  VT No sustained events further BMET and Mg stable 1/9 (episode occurred in October)   s/p Medtronic single chamber ICD  Few short episodes of NSVT - there was a slight delay in  detection due to Rate cut off.  I dropped VT detection from to 320 ms and extended the NID to 24 beats    Obesity Body mass index is 48.07 kg/m.  Encouraged lifestyle modification    Moderate mitral regurgitation No acute symptoms. Management to be coordinated with cardiology.  Moderate to severe tricuspid regurgitation Potential impact on biventricular pacing upgrade discussed. Need to evaluate tricuspid valve status before device upgrade. Discussed potential interventions, including non-surgical options, pending cardiology input. - Consulted with cardiology team regarding tricuspid valve management. - Evaluated potential interventions for tricuspid valve.  Cardiac device management (ICD, consideration for biventricular upgrade) Biventricular upgrade considered to improve cardiac efficiency, pending tricuspid valve evaluation and cardiology consensus. Discussed benefits and risks, including potential tricuspid valve intervention. - Consulted with cardiology team regarding biventricular upgrade. - Schedule follow-up with heart rhythm specialist in 3-4 months.  Chronic kidney disease Creatinine at 1.58. No acute issues.  Disposition:   Follow up with Dr. Kennyth in ~ June to explore CRT options, pending discussion with HF and Cardiology    Signed, Morene Phoenix, PA  "

## 2024-07-15 ENCOUNTER — Ambulatory Visit: Payer: Self-pay | Admitting: Cardiology

## 2024-07-17 ENCOUNTER — Ambulatory Visit (HOSPITAL_COMMUNITY): Admitting: Cardiology

## 2024-07-20 ENCOUNTER — Ambulatory Visit (HOSPITAL_COMMUNITY): Admission: RE | Admit: 2024-07-20 | Admitting: Cardiology

## 2024-07-20 ENCOUNTER — Encounter (HOSPITAL_COMMUNITY): Payer: Self-pay | Admitting: Cardiology

## 2024-07-20 NOTE — Progress Notes (Incomplete)
" ° °  ADVANCED HEART FAILURE FOLLOW UP CLINIC NOTE  Referring Physician: Shelda Atlas, MD  Primary Care: Shelda Atlas, MD Primary Cardiologist:  HPI: William Davila is a 59 y.o. male with a PMH of systolic heart failure, cardiomyopathy diagnosed 2001, HLD, HTN, and obesity who presents for follow up of chronic systolic heart faliure.      Discharged from [facility] 05/10/2011 Discharge weight 328.  Massive volume over load and low output. Cardiac output increased inotropes. Medications initiated ace, beta blocker, digoxin , and torsemide .    Follow up 4/23, had not been seen since 2/22. NYHA II and volume OK.   Admitted 1/24 to [facility] with COVID.   Yearly follow up 08/17/22, NYHA II-III and volume up. Farxiga  started and instructed to take a dose of metolazone  2.5/40 KCL x 1.     SUBJECTIVE:  Patient reports that he is feeling fair. He notes that over the past few months he has had progressive worsening of his shortness of breath with exertion. He has had to decrease his heart failure medications recently. We spent a while discussing his heart failure as well as potential consideration of advanced therapies. We discussed LVAD as well as the potential risks and benefits of such an implant. He would like to think about it.   PMH, current medications, allergies, social history, and family history reviewed in epic.  PHYSICAL EXAM: There were no vitals filed for this visit.  GENERAL: Well nourished and in no apparent distress at rest.  PULM:  Normal work of breathing, clear to auscultation bilaterally. Respirations are unlabored.  CARDIAC:  JVP: flat         Normal rate with regular rhythm. No murmurs, rubs or gallops.  1+ edema. Warm and well perfused extremities. ABDOMEN: Soft, non-tender, non-distended. NEUROLOGIC: Patient is oriented x3 with no focal or lateralizing neurologic deficits.    DATA REVIEW  ECG: 01/2023: LBBB    ECHO: - 07/2023: LVEF 25-30%, LVH, mildly reduced RV  function, TV moderate to severe regurgitation  - Echo (9/20): EF 25-30%   - Echo (1/18): EF 25-30%   - Echo (1/16): EF 25-30% RV normal    - Echo (3/13): EF 20-25%   CATH: 02/2019: RA 7, PA 37/21 (28), PCWP 12, Normal output, normal coronary arteries    ASSESSMENT & PLAN:  Chronic Systolic Heart Failure - Medtronic Single lead ICD 10/2011   - Progressive symptoms, now NYHA Class III - Euvolemic - Review of EKG, would repeat at next visit and consider CRT, though I suspect that his cardiomyopathy may be too advanced to significantly benefit - Continue farxiga  10mg  daily - Continue carvedilol  6.25mg  BID - Continue entresto  24/26mg  BID - Not on MRA with low BP - Continue digoxin  0.0625mg  daily - On metolazone  2.5mg  prn - Long discussion about advanced therapies as above -  BNP likely too elevated for barostim - Low threshold for RHC at next visit. His progressive low BP and decrease in medications is concerning - We referred him to Dr. Fernande. Had gen change in 9/21 but did not think he qualified for upgrade to CRT with IVCD.  2. Morbid Obesity - Continues on on Ozempic   3. Probable OSA - Refuses sleep study     Follow up in 3 months  William Brownie, MD Advanced Heart Failure Mechanical Circulatory Support 07/20/24 "

## 2024-07-20 NOTE — Patient Instructions (Signed)
 There has been no changes to your medications.  Your physician recommends that you schedule a follow-up appointment in: as scheduled.  If you have any questions or concerns before your next appointment please send us  a message through [place] or call our office at [phone].    TO LEAVE A MESSAGE FOR THE NURSE SELECT OPTION 2, PLEASE LEAVE A MESSAGE INCLUDING: YOUR NAME DATE OF BIRTH CALL BACK NUMBER REASON FOR CALL**this is important as we prioritize the call backs  YOU WILL RECEIVE A CALL BACK THE SAME DAY AS LONG AS YOU CALL BEFORE 4:00 PM  At the [facility], you and your health needs are our priority. As part of our continuing mission to provide you with exceptional heart care, we have created designated Provider Care Teams. These Care Teams include your primary Cardiologist (physician) and Advanced Practice Providers (APPs- Physician Assistants and Nurse Practitioners) who all work together to provide you with the care you need, when you need it.   You may see any of the following providers on your designated Care Team at your next follow up: Dr Toribio Fuel Dr Ezra Shuck Dr. [name] Greig Mosses, NP Caffie Shed, [place] [facility], [place] Beckey Coe, NP Jordan Lee, NP Ellouise Class, NP Tinnie Redman, PharmD Jaun Bash, PharmD   Please be sure to bring in all your medications bottles to every appointment.    Thank you for choosing [facility]

## 2024-08-02 ENCOUNTER — Ambulatory Visit (HOSPITAL_COMMUNITY): Admitting: Cardiology

## 2024-08-24 ENCOUNTER — Ambulatory Visit: Payer: Self-pay

## 2024-11-23 ENCOUNTER — Ambulatory Visit: Payer: Self-pay
